# Patient Record
Sex: Male | Born: 1935 | Race: White | Hispanic: No | Smoking: Former smoker
Health system: Southern US, Community
[De-identification: ages and names within clinical notes are randomized; demographics above are authoritative.]

## PROBLEM LIST (undated history)

## (undated) DIAGNOSIS — N189 Chronic kidney disease, unspecified: Secondary | ICD-10-CM

## (undated) DIAGNOSIS — M1990 Unspecified osteoarthritis, unspecified site: Secondary | ICD-10-CM

## (undated) DIAGNOSIS — C3490 Malignant neoplasm of unspecified part of unspecified bronchus or lung: Secondary | ICD-10-CM

## (undated) DIAGNOSIS — I10 Essential (primary) hypertension: Secondary | ICD-10-CM

## (undated) DIAGNOSIS — E559 Vitamin D deficiency, unspecified: Secondary | ICD-10-CM

## (undated) DIAGNOSIS — E039 Hypothyroidism, unspecified: Secondary | ICD-10-CM

## (undated) DIAGNOSIS — K219 Gastro-esophageal reflux disease without esophagitis: Secondary | ICD-10-CM

## (undated) DIAGNOSIS — I2510 Atherosclerotic heart disease of native coronary artery without angina pectoris: Secondary | ICD-10-CM

## (undated) DIAGNOSIS — H9190 Unspecified hearing loss, unspecified ear: Secondary | ICD-10-CM

## (undated) DIAGNOSIS — D649 Anemia, unspecified: Secondary | ICD-10-CM

## (undated) DIAGNOSIS — R7303 Prediabetes: Secondary | ICD-10-CM

## (undated) DIAGNOSIS — C801 Malignant (primary) neoplasm, unspecified: Secondary | ICD-10-CM

## (undated) HISTORY — DX: Prediabetes: R73.03

## (undated) HISTORY — PX: CORONARY ANGIOPLASTY: SHX604

## (undated) HISTORY — PX: TONSILLECTOMY: SUR1361

## (undated) HISTORY — DX: Malignant neoplasm of unspecified part of unspecified bronchus or lung: C34.90

## (undated) HISTORY — PX: EYE SURGERY: SHX253

## (undated) HISTORY — DX: Vitamin D deficiency, unspecified: E55.9

## (undated) HISTORY — PX: CHOLECYSTECTOMY: SHX55

## (undated) HISTORY — PX: BACK SURGERY: SHX140

## (undated) HISTORY — PX: JOINT REPLACEMENT: SHX530

## (undated) HISTORY — PX: APPENDECTOMY: SHX54

---

## 1998-02-12 ENCOUNTER — Other Ambulatory Visit: Admission: RE | Admit: 1998-02-12 | Discharge: 1998-02-12 | Payer: Self-pay | Admitting: Internal Medicine

## 1998-04-30 ENCOUNTER — Other Ambulatory Visit: Admission: RE | Admit: 1998-04-30 | Discharge: 1998-04-30 | Payer: Self-pay | Admitting: Internal Medicine

## 1999-02-06 ENCOUNTER — Ambulatory Visit (HOSPITAL_COMMUNITY): Admission: RE | Admit: 1999-02-06 | Discharge: 1999-02-06 | Payer: Self-pay | Admitting: Gastroenterology

## 2002-04-15 ENCOUNTER — Emergency Department (HOSPITAL_COMMUNITY): Admission: EM | Admit: 2002-04-15 | Discharge: 2002-04-15 | Payer: Self-pay | Admitting: Emergency Medicine

## 2003-01-10 ENCOUNTER — Encounter: Payer: Self-pay | Admitting: Internal Medicine

## 2003-01-10 ENCOUNTER — Ambulatory Visit (HOSPITAL_COMMUNITY): Admission: RE | Admit: 2003-01-10 | Discharge: 2003-01-10 | Payer: Self-pay | Admitting: Internal Medicine

## 2003-01-25 ENCOUNTER — Encounter: Payer: Self-pay | Admitting: Orthopedic Surgery

## 2003-01-30 ENCOUNTER — Encounter: Payer: Self-pay | Admitting: Orthopedic Surgery

## 2003-01-30 ENCOUNTER — Inpatient Hospital Stay (HOSPITAL_COMMUNITY): Admission: RE | Admit: 2003-01-30 | Discharge: 2003-02-02 | Payer: Self-pay | Admitting: Orthopedic Surgery

## 2003-02-02 ENCOUNTER — Inpatient Hospital Stay (HOSPITAL_COMMUNITY)
Admission: RE | Admit: 2003-02-02 | Discharge: 2003-02-09 | Payer: Self-pay | Admitting: Physical Medicine & Rehabilitation

## 2004-02-05 ENCOUNTER — Inpatient Hospital Stay (HOSPITAL_COMMUNITY): Admission: RE | Admit: 2004-02-05 | Discharge: 2004-02-12 | Payer: Self-pay | Admitting: Orthopedic Surgery

## 2004-02-12 ENCOUNTER — Inpatient Hospital Stay (HOSPITAL_COMMUNITY)
Admission: RE | Admit: 2004-02-12 | Discharge: 2004-02-16 | Payer: Self-pay | Admitting: Physical Medicine & Rehabilitation

## 2004-10-16 ENCOUNTER — Encounter (INDEPENDENT_AMBULATORY_CARE_PROVIDER_SITE_OTHER): Payer: Self-pay | Admitting: Specialist

## 2004-10-16 ENCOUNTER — Ambulatory Visit (HOSPITAL_COMMUNITY): Admission: RE | Admit: 2004-10-16 | Discharge: 2004-10-16 | Payer: Self-pay | Admitting: Gastroenterology

## 2004-10-16 LAB — HM COLONOSCOPY

## 2008-06-02 ENCOUNTER — Encounter: Admission: RE | Admit: 2008-06-02 | Discharge: 2008-06-02 | Payer: Self-pay | Admitting: Cardiology

## 2008-06-06 ENCOUNTER — Inpatient Hospital Stay (HOSPITAL_BASED_OUTPATIENT_CLINIC_OR_DEPARTMENT_OTHER): Admission: RE | Admit: 2008-06-06 | Discharge: 2008-06-06 | Payer: Self-pay | Admitting: Cardiology

## 2008-06-12 ENCOUNTER — Inpatient Hospital Stay (HOSPITAL_COMMUNITY): Admission: RE | Admit: 2008-06-12 | Discharge: 2008-06-13 | Payer: Self-pay | Admitting: Cardiology

## 2008-06-12 ENCOUNTER — Ambulatory Visit: Payer: Self-pay | Admitting: Cardiology

## 2008-07-06 ENCOUNTER — Encounter (HOSPITAL_COMMUNITY): Admission: RE | Admit: 2008-07-06 | Discharge: 2008-10-04 | Payer: Self-pay | Admitting: Cardiology

## 2011-03-25 NOTE — H&P (Signed)
NAMEKINGSTYN, Malone               ACCOUNT NO.:  [id number]   MEDICAL RECORD NO.:  [id number]          PATIENT TYPE:  OIB   LOCATION:  2914                         FACILITY:  [facility]   PHYSICIAN:  Everardo Beals. Juanda Chance, MD, FACCDATE OF BIRTH:  11-01-1929   DATE OF ADMISSION:  06/12/2008  DATE OF DISCHARGE:                              HISTORY & PHYSICAL   PRIMARY CARE PHYSICIAN:  Lucky Cowboy, MD   PRIMARY CARDIOLOGIST:  Georga Hacking, MD   CHIEF COMPLAINT:  Abnormal heart catheterization.   HISTORY OF PRESENT ILLNESS:  [name] is a 77 year old male who had  chest discomfort and then had an abnormal Myoview.  Dr. Donnie Aho cath'd  the patient, and he had a very tight ostial lesion in the RCA.  Dr.  Juanda Chance reviewed the films and felt percutaneous intervention was  indicated.  Mr. [name] is here today for the procedure.   Mr. Winski is currently pain free.  He has not had resting chest pain.  He has not had exertional chest pain recently because he is limiting his  activity.  Currently, he is resting comfortably.   PAST MEDICAL HISTORY:  1. Hyperlipidemia.  2. Hypertension.  3. Morbid obesity.  4. Hypothyroidism.  5. Diabetes (The patient is aware of the possibility, but is not aware      that this is a diagnosis for him).  6. Anemia.   ALLERGIES:  CELEBREX.   CURRENT MEDICATIONS:  1. Atenolol 100 mg one half tablet daily.  2. Bumex 2 mg one half tablet daily.  3. Levothyroxine 200 mcg, one half tablet daily  4. Multivitamin and vitamin C daily.  5. Doxazosin 8 mg one half tablet daily.  6. Aspirin 81 mg daily.   PAST SURGICAL HISTORY:  He is status post diagnostic cardiac  catheterization as well as colonoscopy and polypectomy, left total knee  replacement, and right total knee replacement.   SOCIAL HISTORY:  He lives in [place] with a significant other.  He  quit smoking more than 25 years ago.  He has a drink daily, but denies  alcohol or drug abuse.   FAMILY  HISTORY:  Noncontributory.   REVIEW OF SYSTEMS:  He is extremely hard of hearing.  His chest pain is  as described above.  He has some chronic arthralgias and joint pains.  He denies hematemesis, hemoptysis, or melena.  He has had no fevers or  chills.  Full 14-point review of systems is otherwise negative.   PHYSICAL EXAMINATION:  VITAL SIGNS:  Temperature is 98.0, blood pressure  152/71, heart rate 57, respiratory 20, and O2 saturation 97% on room  air.  GENERAL:  He is a well-developed and well-nourished white male, in no  acute distress.  HEENT:  Normal.  NECK:  There is no JVD, no thyromegaly, and no carotid bruits  appreciated.  LUNGS:  Essentially clear to auscultation bilaterally.  CV:  His heart is regular rate and rhythm with an S1 and S2 and no  significant murmur, rub, or gallop is noted.  ABDOMEN:  Obese, soft, and nontender with active bowel sounds.  EXTREMITIES:  There is no cyanosis or clubbing noted, but he has trace  lower extremity edema.  SKIN:  No rashes or lesions are noted, but he has some irritation from  the tape, used in the previous catheterization in his right groin, but  his right groin has no significant ecchymosis, bruit, or hematoma.  NEUROLOGIC:  He is alert and oriented with cranial nerves II through XII  grossly intact.   Chest x-ray from June 2009, shows no acute disease.   LABORATORY VALUES:  Hemoglobin 13.6, hematocrit 41.3, WBC is 8.1, and  platelets 183.  INR 1.0 and PTT 27.  Sodium 135, potassium 4.2, chloride  106, CO2 of 21, BUN 26, creatinine 1.34, GFR 52, and glucose 134.   EKG done on Apr 05, 2008, shows sinus rhythm rate 64 beats per minute  with no acute ischemic changes.   IMPRESSION:  Coronary artery disease:  Mr. Crombie is here today for  percutaneous intervention.  He will follow up with Dr. Donnie Aho after this  with further management of his coronary artery disease and cardiac risk  factors per Dr. Donnie Aho.  We will order a  Nutrition consult while he is  in the hospital, they will talk to him about a heart-healthy, low-fat,  and decreased carbohydrate diet.      Theodore Demark, PA-C      Bruce R. Juanda Chance, MD, [facility]  Electronically Signed    RB/MEDQ  D:  06/12/2008  T:  06/12/2008  Job:  [id number]

## 2011-03-25 NOTE — Cardiovascular Report (Signed)
[name], [name]               ACCOUNT NO.:  [id number]   MEDICAL RECORD NO.:  [id number]          PATIENT TYPE:  OIB   LOCATION:  2914                         FACILITY:  [facility]   PHYSICIAN:  Everardo Beals. Juanda Chance, MD, FACCDATE OF BIRTH:  07-20-1929   DATE OF PROCEDURE:  06/12/2008  DATE OF DISCHARGE:                            CARDIAC CATHETERIZATION   CLINICAL HISTORY:  [name] is 77 years old and developed exertional  angina, and I believe he had abnormal Myoview scan.  He was studied in  the outpatient laboratory and found to have a critically tight ostial  lesion in the right coronary artery and was scheduled for percutaneous  intervention today.   PROCEDURE:  The procedure was followed via the femoral artery using  arterial sheath and a 6-French right bypass graft guiding catheter with  side holes.  The patient was given antiemetics bolus infusion and he was  started on Plavix last week and was given additional 300 mg of Plavix  load as well as four chewable aspirin.  We passed a Prowater wire across  the lesion in the ostium of the right coronary and down the distal  vessel without difficulty.  We predilated the lesion with a 2.0 x 50-mm  apex performing two inflations up to 8 atmospheres for about 30 seconds.  We then used a 3.25 x 6-mm cutting balloon and performed three  inflations up to 10 atmospheres for about 30 seconds.  We then upgraded  to a 3.75 x 6-mm cutting balloon and performed three inflations up to 10  atmospheres for about 30 seconds.  We then deployed a 3.5 x 12-mm  Liberte bare-metal stent positioning the proximal edge just outside the  ostium and deploying this with one inflation of 16 atmospheres for 30  seconds.  We postdilated the stent avoiding the distal edge with a 4.5 x  8-mm Quantum Maverick performing three inflations of 16 atmospheres for  about 30 seconds.  Final diagnostics were then performed through the  guiding catheter.  The patient  tolerated the procedure well and left the  laboratory in satisfactory condition.   RESULTS:  Initially, stenosis at the ostium of the right coronary was  estimated at 95%.  Following stenting, this improved to less than 10%.   CONCLUSION:  Successful percutaneous transluminal angioplasty of the  ostial lesion in the right coronary using a Liberte bare-metal stent  with improvement center narrowing from 95% to less than 10%.   DISPOSITION:  The patient then presents for further observation.  Recommend Plavix for a year.      Bruce Elvera Lennox Juanda Chance, MD, [facility]  Electronically Signed     BRB/MEDQ  D:  06/12/2008  T:  06/13/2008  Job:  [id number]   cc:   Georga Hacking, M.D.

## 2011-03-25 NOTE — Cardiovascular Report (Signed)
[name], [name] NO.:  [id number]   MEDICAL RECORD NO.:  [id number]          PATIENT TYPE:  OIB   LOCATION:  1961                         FACILITY:  [facility]   PHYSICIAN:  Georga Hacking, M.D.DATE OF BIRTH:  1928/11/12   DATE OF PROCEDURE:  06/06/2008  DATE OF DISCHARGE:                            CARDIAC CATHETERIZATION   HISTORY:  A 77 year old male who has had dyspnea, diabetes, and some  vague chest pain and had an abnormal Cardiolite with inferior ischemia  and a previous inferior infarction.   PROCEDURE:  Left heart catheterization with coronary angiograms and left  ventriculogram.   PROCEDURE:  The procedure was done in the outpatient diagnostic  laboratory.  He was prepped and draped in the usual manner.  After  Xylocaine anesthesia, a 4-French sheath was placed in the right femoral  percutaneously through a single anterior needle wall stick.  The  arteries were visualized using 4-French catheters and a 30 mL  ventriculogram was performed.  He tolerated the procedure well and at  the end of the procedure, his sheath was removed.   HEMODYNAMIC DATA:  Aorta postcontrast 158/63 and LV postcontrast 158/20.   ANGIOGRAPHIC DATA:  Left ventriculogram:  Performed in the 30-degree RAO  projection.  The aortic valve is normal.  The mitral valve is normal.  The left ventricle appears normal in size.  There is mild inferior wall  hypokinesis noted.  The estimated ejection fraction was 50%.  Coronary  arteries arise and distribute normally.  Significant calcifications  noted involving the left coronary system as well as the ostium of the  right coronary artery.  Left main coronary artery:  Calcified with diffuse 30% narrowing.  There  is calcification present at the ostium of the vessel also.  Left anterior descending:  The vessel is calcified with diffuse  narrowing in the proximal portion, but there is no severe obstructive  stenoses noted.  The vessels  are somewhat small distally and appears to  be diffusely diseased proximally.  Circumflex coronary artery:  Calcified with scattered irregularities.  Right coronary artery:  Calcified at the ostium.  There is a severe 95-  99% ostial stenosis which is heavily calcified and eccentric.  The  vessel is a large vessel and is a dominant vessel supplying the  posterior descending and several posterolateral branches.   IMPRESSION:  1. Severe ostial coronary artery disease involving the right coronary      artery and moderate diffuse calcific      coronary artery disease involving the left coronary artery.  2. Mild left ventricular dysfunction with inferior wall hypokinesis      with preserved ejection fraction.   RECOMMENDATIONS:  Consider rotational atherectomy and stenting of the  ostium of the right coronary artery.      Georga Hacking, M.D.  Electronically Signed     WST/MEDQ  D:  06/06/2008  T:  06/06/2008  Job:  [id number]   cc:   Jeoffrey Massed, MD

## 2011-03-25 NOTE — Discharge Summary (Signed)
[name], [name]               ACCOUNT NO.:  [id number]   MEDICAL RECORD NO.:  [id number]          PATIENT TYPE:  OIB   LOCATION:  2914                         FACILITY:  [facility]   PHYSICIAN:  Everardo Beals. Juanda Chance, MD, FACCDATE OF BIRTH:  11-Apr-1929   DATE OF ADMISSION:  06/12/2008  DATE OF DISCHARGE:  06/13/2008                               DISCHARGE SUMMARY   PROCEDURES:  1. Cardiac catheterization.  2. PTCA and bare-metal stent to one vessel.   PRIMARY/FINAL DISCHARGE DIAGNOSIS:  Unstable anginal pain, status post  percutaneous transluminal coronary angioplasty and bare-metal stent to  the right coronary artery  this admission.   SECONDARY DIAGNOSES:  1. Hypertension.  2. Hyperlipidemia.  3. Morbid obesity.  4. Hypothyroidism.  5. Diabetes with a fasting blood sugar of 129.  6. Anemia.  7. Allergy or intolerance to Celebrex.   TIME OF DISCHARGE:  34 minutes.   HOSPITAL COURSE:  Mr. Balandran is a 77 year old male with no previous  history of coronary artery disease.  Dr. Donnie Aho did heart cath on him  for an abnormal Myoview and he had a significant stenosis in his RCA.  He came to the hospital for outpatient percutaneous intervention on  June 12, 2008.   Mr. Bischof had a bare-metal stent to his RCA reducing the stenosis from  95% to less than 10%.  Mr. Hartig tolerated the procedure well.  He was  Angio-Sealed.  On June 13, 2008, his groin was stable.  He was seen by  cardiac rehab and had no chest pain with ambulation.  Dr. Juanda Chance  evaluated Mr. Kurtzman and felt that he was stable for discharge and  outpatient followup with Dr. Donnie Aho.   DISCHARGE INSTRUCTIONS:  1. His activity level is to be increased gradually with no lifting for      2 weeks and no driving for 2 days.  He is to stick to a low sodium      and low sugar diet.  He is to follow up with Dr. Donnie Aho within 2      weeks.  He is to follow up with Dr. Milinda Cave as needed.   DISCHARGE MEDICATIONS:  1. Atenolol  100 mg one-half tablet daily.  2. Bumetanide 2 mg one half tablet daily  3. Levoxyl 200 mcg daily.  4. Doxazosin 8 mg one half tablet nightly.  5. Pravastatin 40 mg daily.  6. Aspirin 81 mg one half tablet daily.  7. Allopurinol 300 mg one half tablet daily.  8. Plavix 75 mg daily.  9. Vitamin D 8000 units daily.  10.Vitamin C daily.  11.Multivitamin daily.  12.Sublingual nitroglycerin p.r.n.      Theodore Demark, PA-C      Bruce R. Juanda Chance, MD, [facility]  Electronically Signed    RB/MEDQ  D:  06/13/2008  T:  06/13/2008  Job:  [id number]   cc:   Jeoffrey Massed, MD  Georga Hacking, M.D.

## 2011-03-25 NOTE — Cardiovascular Report (Signed)
[name], [name]               ACCOUNT NO.:  [id number]   MEDICAL RECORD NO.:  [id number]          PATIENT TYPE:  OIB   LOCATION:  1961                         FACILITY:  [facility]   PHYSICIAN:  Everardo Beals. Juanda Chance, MD, FACCDATE OF BIRTH:  1928/12/10   DATE OF PROCEDURE:  DATE OF DISCHARGE:  06/06/2008                            CARDIAC CATHETERIZATION   I saw [name] today at the request of Dr. Viann Fish for  consideration for PCI of an ostial right lesion.  [name] is 77 years  old and has had exertional chest discomfort, had an abnormal Myoview  scan showing inferior ischemia.  Catheterization done today by Dr.  Donnie Aho showed a very tight ostial lesion in the right coronary artery  with calcification.  There was no nonobstructive disease in the left  system.  The calibrated vessel was at least 40.   We planned percutaneous coronary intervention of the right coronary  artery lesion on Monday, June 12, 2008.  We will probably do rotational  atherectomy and probably use a bare metal stent.  I discussed the  procedure and risks with Mr. Donnie Aho and his partner Latanya Presser.  I  will plan for him to come in early Monday morning.      Bruce Elvera Lennox Juanda Chance, MD, [facility]  Electronically Signed     BRB/MEDQ  D:  06/06/2008  T:  06/06/2008  Job:  [id number]

## 2011-03-28 NOTE — Discharge Summary (Signed)
NAME:  [name], [name]                         ACCOUNT NO.:  [id number]   MEDICAL RECORD NO.:  [id number]                   PATIENT TYPE:  INP   LOCATION:  5037                                 FACILITY:  [facility]   PHYSICIAN:  Loreta Ave, M.D.              DATE OF BIRTH:  10-25-1929   DATE OF ADMISSION:  01/30/2003  DATE OF DISCHARGE:  02/02/2003                                 DISCHARGE SUMMARY   ADMISSION DIAGNOSES:  Advanced degenerative joint disease of the right knee.   DISCHARGE DIAGNOSES:  1. Advanced degenerative joint disease of the right knee.  2. Hypertension.  3. Hypothyroidism.  4. Exogenous obesity.   PROCEDURE:  Right total knee replacement.   HISTORY OF PRESENT ILLNESS:  The patient is a 77 year old male with  longstanding pain in the right knee.  Radiographically noted bone on bone  degenerative joint disease.  He has failed with conservative treatment.  He  is now having difficulty with his activities of daily living.  He is  indicated now for right total knee replacement.   HOSPITAL COURSE:  77 year old male admitted January 31, 2003 after appropriate  laboratory studies were obtained, as well as 1 gram of Ancef intravenous on  call to the operating room, was taken to the operating room where he  underwent a right total knee replacement.  Intraoperative Foley was placed.  The patient tolerated the procedure well.  A PCA pump was used for  postoperative pain management, that of Dilaudid. Heparin 5000 units SUBQ,  q.12h. was started until Coumadin became therapeutic.  Consultation with  physical therapy, occupational therapy and rehabilitation were made.  Ambulation with weight-bearing as tolerated on the right, CPM 0-30 degrees  for 8 hours a day, increasing by 10 degrees per day.  He was allowed out of  bed to a chair the following day. He did have hypoactive bowel sounds and a  liquid diet was started on the first postoperative day.  This was increased  as tolerated.  Reglan 5 mg p.o. t.i.d. per 24 hours was begun.  On February 01, 2003 he had his diet advanced.  He was weaned of oral pain medications.  He  was placed on 1000 cc fluid restriction.  He did well postoperatively and  was transferred to rehabilitation on February 02, 2003 to continue with his  physical therapy and occupational therapy.   LABORATORY DATA:  Radiographic studies of January 30, 2003 reveals recent  postoperative image of a total knee arthroplasty without complications. A  chest x-ray of January 10, 2003 revealed changes of bronchitis, possibly acute  versus chronic.   His laboratory studies revealed hemoglobin 13.8, hematocrit 40.5%, white  blood cell count 10,100, platelet count 172,000.  Discharge hemoglobin was  10.1, hematocrit 30.2%.  ProTime at discharge was 16.0 with INR 1.3.  Chemistries preop showed sodium 138, potassium 4.3, chloride 108, cO2 26,  glucose 111,  BUN 18, creatinine 1.3, calcium 9.0, total protein 6.9, albumin  3.5, AST 26, ALT 27, ALP 83, total bilirubin 0.7.  Discharge sodium 133,  potassium 3.7, chloride 99, cO2 26, glucose 137, BUN 13, creatinine 1.3,  calcium 8.3.  Urinalysis revealed trace ketones, 30 protein, otherwise  unremarkable.  Blood type O positive, antibody screen is negative.    DISCHARGE INSTRUCTIONS:  The patient was transferred to rehabilitation where  he will continue with his physical therapy and occupational therapy.  He is  discharged on a house diet.   CONDITION ON DISCHARGE:  The patient is discharged in good condition.     Oris Drone Petrarca, P.A.-C.                Loreta Ave, M.D.    BDP/MEDQ  D:  03/10/2003  T:  03/10/2003  Job:  [id number]

## 2011-03-28 NOTE — Discharge Summary (Signed)
NAME:  [name], [name]                         ACCOUNT NO.:  [id number]   MEDICAL RECORD NO.:  [id number]                   PATIENT TYPE:  IPS   LOCATION:  4140                                 FACILITY:  [facility]   PHYSICIAN:  Ranelle Oyster, M.D.             DATE OF BIRTH:  1928-11-28   DATE OF ADMISSION:  02/12/2004  DATE OF DISCHARGE:  02/16/2004                                 DISCHARGE SUMMARY   DISCHARGE DIAGNOSES:  1. Left total knee replacement.  2. Renal insufficiency.  3. Wound prophylaxis.   HISTORY OF PRESENT ILLNESS:  Mr. Savitz is a 77 year old male with history  of hypertension, DJD, status post right knee replacement, and end-stage  arthritis left knee.  He elected to undergo left total knee replacement by  Dr. Eulah Pont.  Postoperatively he is weightbearing as tolerated, on Coumadin  for DVT prophylaxis.  He was noted to have postoperative anemia and was  transfused with 2 units of packed red blood cells.  He developed a left  pretibial area of erythema and was placed on Keflex for wound prophylaxis.  Therapies initiated and the patient currently at minimum assist for  transfers, minimum assist for ambulating 40 feet with wide walker.   PAST MEDICAL HISTORY:  Significant for cholecystectomy, appendectomy,  nocturia, occasional constipation, tonsillectomy, low back surgery, right  total knee replacement, cataracts, hypothyroidism.   ALLERGIES:  No known drug allergies.   SOCIAL HISTORY:  The patient lives with girlfriend in one-level home with  one step at entry.  Was independent prior to admission.  He could quit  smoking in the 1970s, uses alcohol three times a week.   HOSPITAL COURSE:  Mr. Owain Eckerman was admitted to rehab on February 12, 2004  for inpatient therapies to consist of PT/OT daily.  Past admission he was  maintained on Coumadin for DVT prophylaxis and postoperative anemia was  supplemented with iron supplements.  Blood pressures were monitored on  b.i.d. basis on Tenormin and Cardura.  Bumex was held as follow-up labs  showed the patient to have renal insufficiency with BUN at 29, creatinine  1.5.  Follow-up of postoperative anemia showed H&H to be stable with  hemoglobin 9.8, hematocrit 28.3, white count 8.9, platelets 256.  A recheck  of renal insufficiency off Bumex showed the patient to continue with renal  insufficiency with worsening of his dehydration with BUN at 32, creatinine  at 1.4.  The patient is instructed to keep pushing p.o. fluids, home health  R.N. to draw electrolytes in 1 week with results to Dr. Oneta Rack.  The  patient's knee incision has healed well without any signs or symptoms of  infection.  He did continue with cellulitic changes on his left shin.  Keflex was continued at time of discharge for additional 5 days.  He is to  follow up with Dr. Eulah Pont for evaluation of this area as well as for further  instructions regarding continuing antibiotics.  During his stay in rehab Mr.  Beyene progressed to being at independent level for transfers, modified  independent for ambulating 150 feet.  Knee flexion was at -2 to 85 degrees.  He was modified independent for upper body care, required supervision to  minimum assist for lower body care.  He will continue to receive follow-up  home health PT/OT by [facility] services with home health R.N. to  draw protime and [facility] to adjust Coumadin as needed through March 07, 2004.  On February 12, 2004 the patient is discharged to home.   DISCHARGE MEDICATIONS:  1. Trinsicon one p.o. b.i.d.  2. Tenormin 50 mg one daily.  3. Levothyroxine 100 mcg a day.  4. Cardura 4 mg a day.  5. Zestril 40 mg a day.  6. Bumex 1 mg a day.  7. Os-Cal + D t.i.d.  8. Coumadin 2.5 mg per day.  9. Robaxin 500 mg q.i.d. p.r.n. spasms.  10.      Oxycodone IR 5-10 mg q.4-6h. p.r.n. pain.   ACTIVITY:  Use walker.   PAIN CONTROL:  May use Tylenol as needed for pain.   DIET:   Regular.  Drink plenty of fluids.   WOUND CARE:  Keep area clean and dry.   SPECIAL INSTRUCTIONS:  No alcohol, no smoking, no driving.   FOLLOW-UP:  1. The patient to follow up with Dr. Oneta Rack for appointment in 4 weeks.  2. Follow up with Dr. Eulah Pont for staple removal in 2-3 days.  3. Follow up with Dr. Riley Kill as needed.      Greg Cutter, P.A.                    Ranelle Oyster, M.D.    PP/MEDQ  D:  02/19/2004  T:  02/20/2004  Job:  [id number]   cc:   Loreta Ave, M.D.  [address]  [place] [address]  Fax: [phone]   Lucky Cowboy, M.D.  [address], [address]  [place], [place] [address]  Fax: [phone]

## 2011-03-28 NOTE — Op Note (Signed)
NAME:  [name], [name]                         ACCOUNT NO.:  [id number]   MEDICAL RECORD NO.:  [id number]                   PATIENT TYPE:  INP   LOCATION:  5037                                 FACILITY:  [facility]   PHYSICIAN:  Loreta Ave, M.D.              DATE OF BIRTH:  [birth year]   DATE OF PROCEDURE:  01/30/2003  DATE OF DISCHARGE:                                 OPERATIVE REPORT   PREOPERATIVE DIAGNOSIS:  End-stage degenerative arthritis, right knee, varus  alignment.   POSTOPERATIVE DIAGNOSIS:  End-stage degenerative arthritis, right knee,  varus alignment.   OPERATION/PROCEDURE:  1. Right total knee replacement, Osteonics prosthesis with appropriate soft     tissue balancing including medial capsular and posterior capsular     release, posterior stabilized, posterior cemented #9 microstructured     femoral component, cemented #9 tibial component with 15 mm PS Flex #9     insert, cemented recessed non metal backed 28 mm patella component.   SURGEON:  Loreta Ave, M.D.   ASSISTANT:  Arlys John D. Petrarca, P.A.-C.   ANESTHESIA:  General.   ESTIMATED BLOOD LOSS:  Minimal.   TOURNIQUET TIME:  One hour and 15 minutes.   SPECIMENS:  Bone and soft tissue.   CULTURES:  None.   COMPLICATIONS:  None.   DRESSINGS:  Soft compressive with knee immobilizer.   DRAINS:  Hemovac x 2.   DESCRIPTION OF PROCEDURE:  The patient was brought to the operating room and  placed on the operating table in the supine position.  After adequate  anesthesia had been obtained, right knee was examined.  Full extension, 120  degrees flexion.  Alignment more than 5 degrees of varus, correctable to  neutral.  Reasonable patellofemoral tracking.  Stable ligaments.  Tourniquet  applied, prepped and draped in the usual sterile fashion. Exsanguinated with  elevation Esmarch, tourniquet inflated to 350 mmHg.   A straight incision above the patella down to the tibial tubercle.  Hemostasis with  cautery.  Medial parapatellar arthrotomy.  Knee exposed.  Grade 4 changes throughout.  Medial capsular released.  Remnants of cruciate  ligaments, menisci periarticular spurs, loose bodies all removed.  Distal  femur exposed.  Intramedullary guide placed.  Distal cut at 5 degrees of  valgus removing 10 mm.  Sized to #9 component.  Jigs put in place and  definitive cuts made for posterior stabilizing component.  Attention turned  to the tibia.  Tibial spine removed with a saw.  Proximal cut removing 4 mm  off the medial side with a 5 degree posterior slope cut protecting the  posterior and collateral structures.  All debris cleared from all recesses.  Patella sized, reamed and drilled for 28 mm component, centering it on the  ridge.  Trials then placed throughout; #9 on the femur, #9 on the tibia, 28  on the patella with the 15 mm Flex insert.  Full extension,  full flexion,  nicely balanced knee with good patellofemoral tracking.  No lift off  __________.  Adequate medial cuts were released to get a balanced knee.  Tibia marked for appropriate rotation and reaming.  All trials were removed.  Copious irrigation with a pulse irrigating device.  Tibial components  cemented in place.  Excess cement removed with Glor[place].  Polyethylene  attached.  Femoral components seated firmly with posterior cement.  Excess  cement was removed.  Patella component cemented in place on the patella.  All cement removed from the margins.  Knee irrigated.  Once the cement had  hardened, the knee was reexamined.  Full extension and full flexion, good  tracking. Patellofemoral joint.  Hemovacs placed and brought out through  separate stab wounds.  Arthrotomy closed with #1 Vicryl, skin and  subcutaneous tissue with Vicryl and staples in the margins of the wound.  Knee injected with Marcaine.  Sterile compressive dressing applied.  Tourniquet deflated and removed. Knee immobilizer removed.  Knee immobilizer  applied.   Anesthesia reversed.  Brought to the recovery room.  Tolerated the  surgery well with no complications.                                               Loreta Ave, M.D.    DFM/MEDQ  D:  01/30/2003  T:  01/30/2003  Job:  [id number]

## 2011-03-28 NOTE — Discharge Summary (Signed)
NAME:  [name], [name]                         ACCOUNT NO.:  [id number]   MEDICAL RECORD NO.:  [id number]                   PATIENT TYPE:  INP   LOCATION:  5030                                 FACILITY:  [facility]   PHYSICIAN:  Loreta Ave, M.D.              DATE OF BIRTH:  09/25/1929   DATE OF ADMISSION:  02/05/2004  DATE OF DISCHARGE:  02/12/2004                                 DISCHARGE SUMMARY   ADMISSION DIAGNOSIS:  Advanced degenerative joint disease of the right knee.   DISCHARGE DIAGNOSIS:  1. Advanced degenerative joint disease of the right knee.  2. Postoperative anemia.  3. Hypertension.  4. Hyperthyroidism.   PROCEDURE:  Left total knee replacement.   HISTORY:  This is a 77 year old male, status post right total knee  replacement with good results.  He is now having increasing pain and  discomfort in his left knee, especially with activities as well as with rest  at night time.  He has failed conservative treatment.  He is now indicated  for left total knee replacement.   HOSPITAL COURSE:  A 77 year old white male admitted February 05, 2004, after  appropriate laboratory studies were obtained as well as 1 gram of Ancef IV  on call at the operating room.  He was taken to the operating room where he  underwent a left total knee replacement.  He tolerated the procedure well.  He was continued on Ancef 1 gram IV q.8h. x3 doses.  A PCA Dilaudid pump as  per protocol was used for pain management.  Femoral nerve block was also  used.  Heparin 5000 units subcu q.12h. was started until his Coumadin became  therapeutic.   Consultation with PT, OT and rehabilitation were made.  He was allowed to  ambulate, weightbearing as tolerated.  A Foley was placed intraoperatively.  He was allowed out of bed to a chair the following day.  He did have some  decreased calcium which was corrected with Os-Cal 500 mg on a daily basis.  His PCA was discontinued on the 30th.  His Hemovacs were  pulled.  Dressing  was changed revealing a benign wound.  He did have mild temperature  elevation, and this is controlled with incentive spirometry.  He was typed  and crossed for one unit of packed cells on April 1, and this was given.  Keflex 500 mg p.o. q.i.d. x5 days was also started on February 11, 2004, for  some redness about the wound.  There was a question of whether there was  some cellulitis.  He was also given one more unit of packed cells on April  3.  The remainder of his hospital course was uneventful.  He was transferred  to rehabilitation on the 3rd to continue with his physical therapy and  occupational therapy.   EKG revealed sinus bradycardia with occasional premature supraventricular  complexes, otherwise normal.   RADIOGRAPHIC STUDIES:  Left knee revealed status post left total knee  arthroplasty without apparent complicating features.   LABORATORY DATA:  Admitted with a hemoglobin 12.7, hematocrit 38.3 percent,  white count 7500, platelets 175,000.  Discharge hemoglobin was 10.2, hematocrit 30.1, WBC 8.7, and platelets  238,000.  Prothrombin time at the time of discharge was 24.6 with an INR of  3.2.  Chemistries revealed sodium of 138, potassium 4.6, chloride 108, cO2  24, glucose 117, BUN 19, creatinine 1.4.  Calcium 8.9.  Total protein 6.8.  Albumin 3.4.  AST 23, ALT 23, ALP 103.  Total bilirubin 0.5.  Discharge  sodium 134, potassium 4.2, chloride 103, cO2 25, glucose 114, BUN 23,  creatinine 1.6, calcium 8.1.  Urinalysis benign for  blood in his urine.  Blood type was O positive.  Antibody screen negative .  Two units of packed cells were given during his hospital course.   DISCHARGE INSTRUCTIONS:  He was discharged on April 4 to rehabilitation  where he will continue with his physical and occupational therapy.   DISCHARGE CONDITION:  He was discharged in improved condition.      Oris Drone Petrarca, P.A.-C.                Loreta Ave, M.D.     BDP/MEDQ  D:  03/08/2004  T:  03/08/2004  Job:  [id number]

## 2011-03-28 NOTE — Discharge Summary (Signed)
NAME:  [name], [name]                         ACCOUNT NO.:  [id number]   MEDICAL RECORD NO.:  [id number]                   PATIENT TYPE:  IPS   LOCATION:  4145                                 FACILITY:  [facility]   PHYSICIAN:  Ellwood Dense, M.D.                DATE OF BIRTH:  01-Dec-1928   DATE OF ADMISSION:  02/02/2003  DATE OF DISCHARGE:  02/09/2003                                 DISCHARGE SUMMARY   DISCHARGE DIAGNOSES:  1. Right total knee replacement secondary to osteoarthritis.  2. History of hypothyroidism.  3. History of hypertension.  4. Anemia.  5. Hyponatremia, resolved.  6. History of hypercholesterolemia.   HISTORY OF PRESENT ILLNESS:  The patient is a 77 year old white male  admitted on 01/30/03 with a longstanding history of right knee pain secondary  to end-stage degenerative joint disease.  The patient underwent a right  total knee replacement on 3/22 by Dr. Mckinley Jewel.  The patient was placed  on Coumadin for DVT prophylaxis.  PT report at this time indicates the  patient is ambulating 2+ total assist.  The patient ____________.  The  patient is moderate assist with bed mobility.  He is weightbearing as  tolerated.  Hospital course significant for confusion, anemia, hyponatremia,  increased urinary frequency.  The patient was transferred to [facility]  Inpatient Patient Rehabilitation Department on 02/02/03.   PAST MEDICAL HISTORY:  Significant for hypertension, elevated cholesterol,  thyroid disease and decreased hearing.   PAST SURGICAL HISTORY:  Significant for appendectomy, cholecystectomy, T&A,  back surgery.   PRIMARY CARE PHYSICIAN:  Dr. Oneta Rack.   ALLERGIES:  No known drug allergies.   SOCIAL HISTORY:  [place]-level home.  He was independent prior to admission sometimes ambulating  with cane.  He worked as a _______ at [employer].  He has a local  daughter and a girlfriend who are  able to assist after discharge.  Occasional alcohol and does not smoke.  He is widowed.   FAMILY HISTORY:  Noncontributory.   REVIEW OF SYSTEMS:  Denies any chest pain, nausea, vomiting, shortness of  breath.   HOSPITAL COURSE:  The patient was admitted to [facility] Rehab Department on  02/02/03 for comprehensive patient rehabilitation to receive more than three  hours of therapy daily.  Overall, the patient made very good progress during  his seven day stay in rehab.  He was discharged on modified independent  level and was ambulating with standing walker greater than 100 feet.  The  patient remained on Coumadin as well as Lovenox until INR was therapeutic  for DVT prophylaxis.  The patient also had venous Dopplers performed on  02/02/03 that demonstrated no evidence of DVT, SVT, or Baker's cyst  bilaterally.  The patient's hospital course while in rehab significant for  mild hyponatremia, anemia, and constipation.  The patient  remained on  Trinsicon one tablet p.o. b.i.d.  He had initial hemoglobin of 9.5 and  hematocrit 28.1.  He remained on Trinsicon throughout his entire stay in  rehab.  Admission labs also demonstrate the patient had a sodium level of  134.  At the time of admission he was on fluid restriction and the  Hydrochlorothiazide was placed on hold as well.  Fluid restriction will be  continued on day two of rehab.  The patient continued on Hydrochlorothiazide  and continued to be held.  His blood pressure remained fairly stable  throughout his hospital stay.  He continued to be on Lisinopril and  atenolol.  Overall, the patient had no other medical complications except  for constipation.  He received Sorbitol and laxative p.r.n.  and his  constipation resolved.   LATEST LABS AT TIME OF DISCHARGE:  Urine culture performed on 02/06/03 and  demonstrated greater than 100,000 colonies of bacteria species.  Hemoglobin  9.5, hematocrit 27.5, white blood cell count 7.5, platelet  count 160.  Hemoccult performed on stool was negative.  Latest sodium 135, potassium  3.8, chloride 104, CO2 23, glucose 94, BUN 20, creatinine 1.4, AST 18, ALT  15.  At the time of discharge, PT report indicated the patient is ambulating  approximately 75 feet, modified independently with rolling walker, transfer  to sit-stand with rolling walker.  He is able to propel his wheelchair  greater than 150 feet modified independently, bed mobility modified  independently. He is able to perform most ADLs with supervision modified  independent level.  He had a total of 80 degrees flexion in his right knee.  Surgical incision healed very well.  Staples are removed.  Steri-Strips were  applied.  Surgical incision  demonstrated no signs of infection.  At the  time of discharge blood pressure was slightly elevated at 160/72,  respiratory 18, pulse 78, temperature 98.1.  INR at the time of discharge  2.4.  The rest of physical exam was unremarkable.  The patient was  discharged home with his girlfriend.   DISCHARGE MEDICATIONS:  1. Trinsicon one tablet twice daily.  2. Atenolol 50 mg one tablet daily.  3. Levoxyl 0.1 mg one tablet daily.  4. Accupril 40 mg one tablet daily.  5. Niaspan 1000 mg twice daily.  6. Questran at home dose.  7. Coumadin 2.5 mg continued 03/02/03.  8. Oxycodone 5 to 10 mg every four to six hours as needed for pain.  9. Resume his diuretic Bumetanide.  10.      No aspirin, ibuprofen, or Aleve while on Coumadin.   PAIN MANAGEMENT:  Tylenol, oxycodone.   ACTIVITY:  No driving.  No drinking.  Use walker.   DIET:  Low cholesterol, low salt.   He will have [facility] for PT, OT and RN to monitor his PT and  INR.  First draw will be on Monday 02/13/03.  He will follow up with Dr.  Mckinley Jewel within two weeks.  Followup with his primary care [name]  within four to six weeks and monitor hemoglobin.  Followup with Dr. Ellwood Dense as needed.        Junie Bame, P.A.                       Ellwood Dense, M.D.    LH/MEDQ  D:  02/09/2003  T:  02/11/2003  Job:  [id number]   cc:   Lucky Cowboy, M.D.  [address]  [address], [address]  [place], [place] [address]  Fax: [phone]   Loreta Ave, M.D.  [address]  [place] [address]  Fax: [phone]

## 2011-03-28 NOTE — Op Note (Signed)
[name], COLGATE               ACCOUNT NO.:  [id number]   MEDICAL RECORD NO.:  [id number]          PATIENT TYPE:  AMB   LOCATION:  ENDO                         FACILITY:  [facility]   PHYSICIAN:  John C. Madilyn Fireman, M.D.    DATE OF BIRTH:  07-06-1929   DATE OF PROCEDURE:  10/15/2004  DATE OF DISCHARGE:                                 OPERATIVE REPORT   PROCEDURE:  Colonoscopy with polypectomy.   INDICATIONS FOR PROCEDURE:  Family history of colon cancer in a first degree  relative with last colonoscopy five years ago.   DESCRIPTION OF PROCEDURE:  The patient was placed in the left lateral  decubitus position then placed on the pulse monitor with continuous low flow  oxygen delivered by nasal cannula. He was sedated with 62.5 mcg IV fentanyl  and 8 mg IV Versed. The Olympus video colonoscope was inserted into the  rectum and advanced to the cecum, confirmed by transillumination at  McBurney's point and visualization at the ileocecal valve and appendiceal  orifice. The prep was good. The cecum, ascending, transverse, descending and  sigmoid colon all appeared normal with no masses, polyps, diverticula or  other mucosal abnormalities. Within the rectum, there was a 6 mm polyp at 12  cm that was removed by snare.  The remainder of the rectum appeared normal.  The scope was then withdrawn and the patient returned to the recovery room  in stable condition. The patient tolerated the procedure well and there were  no immediate complications.   IMPRESSION:  Small rectal polyp otherwise normal study.   PLAN:  Await histology and will probably repeat colonoscopy in five years.      JCH/MEDQ  D:  10/16/2004  T:  10/16/2004  Job:  [id number]   cc:   Lucky Cowboy, M.D.  [address]  [place], [place] [address]  Fax: [phone]

## 2011-03-28 NOTE — Op Note (Signed)
NAME:  [name], [name]                         ACCOUNT NO.:  [id number]   MEDICAL RECORD NO.:  [id number]                   PATIENT TYPE:  INP   LOCATION:  5030                                 FACILITY:  [facility]   PHYSICIAN:  Loreta Ave, M.D.              DATE OF BIRTH:  Jan 25, 1929   DATE OF PROCEDURE:  02/05/2004  DATE OF DISCHARGE:                                 OPERATIVE REPORT   PREOPERATIVE DIAGNOSES:  End stage degenerative arthritis, left knee with a  varus alignment.   POSTOPERATIVE DIAGNOSES:  End stage degenerative arthritis, left knee with a  varus alignment.  Lateral tracking and tethering of the patellofemoral  joint.   OPERATION PERFORMED:  Left total knee replacement Osteonics prosthesis.  Soft tissue balancing of the medial capsular and lateral retinacular  release.  Cemented posterior stabilized #9 femoral component.  Cemented #9  tibial component with a 12 mm posterior stabilized Flex polyethylene insert.  Cemented recessed 28 mm patellar component.   SURGEON:  Loreta Ave, M.D.   ASSISTANT:  Arlys John D. Petrarca, P.A.-C.   ANESTHESIA:  General.   ESTIMATED BLOOD LOSS:  Minimal.   TOURNIQUET TIME:  One hour and 30 minutes.   SPECIMENS:  Excised bone and soft tissue.   CULTURES:  None.   COMPLICATIONS:  None.   DRESSING:  Soft compressive with knee immobilizer.   DRAINS:  Hemovac times two.   DESCRIPTION OF PROCEDURE:  The patient was brought to the operating room and  placed on the operating table in supine position.  After adequate anesthesia  had been obtained, the left knee examined.  5 degree flexion contracture.  Further flexion to about 90 degrees.  Ligaments grossly stable.  Alignment  in 5 degrees varus correctable to neutral.  Lateral tracking and tethering  patellofemoral joint.  Tourniquet applied.  Leg prepped and draped in the  usual sterile fashion.  Exsanguinated elevation and Esmarch.  Tourniquet  inflated to 330 mmHg.   Straight incision above the patella down to the  tibial tubercle.  Medial parapatellar arthrotomy.  Hemostasis cautery.  Medial capsular release.  Knee exposed.  Grade 4 changes throughout.  Remnants of menisci, cruciate ligaments, loose bodies, spurs all removed.  Distal femur exposed.  Intramedullary guide placed.  Distal cut removed to  10 mm set at 5 degrees of valgus.  Sized to #9 component.  Jigs put in  place.  Definitive cuts made.  Tibia was exposed with a retractor protecting  collaterals and posterior structures.  Tibial spine removed with a saw.  Sized for a #9 component.  Proximal cut 5 degree posterior slope cut  removing 4 to 5 mm off the deficient medial side.  All recess examined.  All  loose bodies and spurs removed.  Patella was debrided of spurs and then  sized, reamed and drilled for the 28 mm component.  Trials put in place.  A  #  9 on the femur, #9 on the tibia and a 28 mm on the patella.  With the 12 mm  insert, I had full extension, full flexion, nicely balanced knee.  Lateral  release was necessary to balance patellofemoral joint and once this was  complete, I had good tracking.  The knee was taken through motion.  The  tibia marked for rotation of the tibial component and then reamed.  All  trials were removed.  Copious irrigation with pulse irrigating device.  Cement placed on all components which were then all firmly hammered into  place and seated.  Polyethylene attached to the tibia and the knee reduced.  All excessive cement removed.  At completion, the knee was examined with  full extension, full flexion, nicely balanced knee at 5 degrees of valgus  with staple ligaments.  Good patellofemoral tracking.  Hemovacs placed,  brought out through separate stab wounds.  Arthrotomy closed with #1 Vicryl.  Skin and subcutaneous tissue with Vicryl and then staples.  Margins of the  wound and knee injected with Marcaine.  Hemovacs clamped.  Sterile  compressive dressing  applied.  Anesthesia reversed.  Brought to recovery  room.  Tolerated surgery well.  No complications.                                               Loreta Ave, M.D.    DFM/MEDQ  D:  02/07/2004  T:  02/07/2004  Job:  [id number]

## 2011-08-08 LAB — APTT: aPTT: 27

## 2011-08-08 LAB — BASIC METABOLIC PANEL
CO2: 21
Calcium: 8.5
GFR calc Af Amer: >60
GFR calc Af Amer: >60
GFR calc non Af Amer: 52 — ABNORMAL LOW
GFR calc non Af Amer: 57 — ABNORMAL LOW
Potassium: 4.1
Sodium: 135
Sodium: 136

## 2011-08-08 LAB — CBC
HCT: 37.8 — ABNORMAL LOW
Hemoglobin: 12.4 — ABNORMAL LOW
Hemoglobin: 13.6
RBC: 4.29
WBC: 8.1
WBC: 8.8

## 2011-08-08 LAB — PROTIME-INR: INR: 1

## 2011-08-11 LAB — GLUCOSE, CAPILLARY

## 2011-08-12 LAB — GLUCOSE, CAPILLARY
Glucose-Capillary: 127 — ABNORMAL HIGH
Glucose-Capillary: 135 — ABNORMAL HIGH

## 2011-08-13 LAB — GLUCOSE, CAPILLARY
Glucose-Capillary: 128 — ABNORMAL HIGH
Glucose-Capillary: 140 — ABNORMAL HIGH

## 2012-03-24 ENCOUNTER — Other Ambulatory Visit: Payer: Self-pay | Admitting: Internal Medicine

## 2012-03-25 ENCOUNTER — Ambulatory Visit
Admission: RE | Admit: 2012-03-25 | Discharge: 2012-03-25 | Disposition: A | Discharge status: Home / Self Care | Payer: Medicare Other | Source: Ambulatory Visit | Attending: Internal Medicine | Admitting: Internal Medicine

## 2012-03-25 ENCOUNTER — Other Ambulatory Visit: Payer: Self-pay

## 2012-03-25 MED ORDER — IOHEXOL 300 MG/ML  SOLN
125.0000 mL | Freq: Once | INTRAMUSCULAR | Status: AC | PRN
  Administered 2012-03-25: 125 mL via INTRAVENOUS

## 2012-04-07 ENCOUNTER — Other Ambulatory Visit: Payer: Self-pay | Admitting: Urology

## 2012-04-14 ENCOUNTER — Encounter (HOSPITAL_COMMUNITY): Payer: Self-pay | Admitting: Pharmacy Technician

## 2012-04-21 ENCOUNTER — Encounter (HOSPITAL_COMMUNITY)
Admission: RE | Admit: 2012-04-21 | Discharge: 2012-04-21 | Disposition: A | Discharge status: Home / Self Care | Payer: Medicare Other | Source: Ambulatory Visit | Attending: Urology | Admitting: Urology

## 2012-04-21 ENCOUNTER — Encounter (HOSPITAL_COMMUNITY): Payer: Self-pay

## 2012-04-21 HISTORY — DX: Unspecified hearing loss, unspecified ear: H91.90

## 2012-04-21 HISTORY — DX: Essential (primary) hypertension: I10

## 2012-04-21 HISTORY — DX: Hypothyroidism, unspecified: E03.9

## 2012-04-21 HISTORY — DX: Chronic kidney disease, unspecified: N18.9

## 2012-04-21 HISTORY — DX: Malignant (primary) neoplasm, unspecified: C80.1

## 2012-04-21 HISTORY — DX: Gastro-esophageal reflux disease without esophagitis: K21.9

## 2012-04-21 LAB — BASIC METABOLIC PANEL
BUN: 33 mg/dL — ABNORMAL HIGH (ref 6–23)
Chloride: 103 mEq/L (ref 96–112)
Creatinine, Ser: 1.5 mg/dL — ABNORMAL HIGH (ref 0.50–1.35)
GFR calc Af Amer: 49 mL/min — ABNORMAL LOW (ref >90)
Glucose, Bld: 127 mg/dL — ABNORMAL HIGH (ref 70–99)
Potassium: 4.5 mEq/L (ref 3.5–5.1)

## 2012-04-21 LAB — CBC
HCT: 46.6 % (ref 39.0–52.0)
Hemoglobin: 15.1 g/dL (ref 13.0–17.0)
RDW: 14.5 % (ref 11.5–15.5)
WBC: 8.6 10*3/uL (ref 4.0–10.5)

## 2012-04-21 MED ORDER — CEFAZOLIN SODIUM 1-5 GM-% IV SOLN: 1.0000 g | INTRAVENOUS | Status: DC

## 2012-04-21 NOTE — Patient Instructions (Addendum)
20 [name]  04/21/2012   Your procedure is scheduled on:  04/29/12 1200noon-245pm  Report to [facility] at 0930 AM.  Call this number if you have problems the morning of surgery: [phone]   Remember:   Do not eat food:After Midnight.  May have clear liquids:until Midnight .   Take these medicines the morning of surgery with A SIP OF WATER:   Do not wear jewelry,   Do not wear lotions, powders, or perfumes.   Do not shave 48 hours prior to surgery. Men may shave face and neck.  Do not bring valuables to the hospital.  Contacts, dentures or bridgework may not be worn into surgery.  Leave suitcase in the car. After surgery it may be brought to your room.  For patients admitted to the hospital, checkout time is 11:00 AM the day of discharge.    Special Instructions: CHG Shower Use Special Wash: 1/2 bottle night before surgery and 1/2 bottle morning of surgery. shower chin to toes with CHG.  Wash face and private parts with regular soap.    Please read over the following fact sheets that you were given: MRSA Information, coughing and deep breathing exercises, leg exercises, Incentive Spirometry Fact Sheet, Blood Transfusion Fact Sheet

## 2012-04-21 NOTE — Pre-Procedure Instructions (Signed)
04/21/12 Patient had a heart rate of 44 on dynamap and 48 manual .  Pulse regular.  Patient voices no complaints. Pulse rate as low as 41 on dynamap.  Heart rate 50 on recent EKG of 04/08/12 done at cardiology office.  Nurse called office of Dr Viann Fish and spoke with nurse at office Olegario Messier).  Olegario Messier spoke with daughter and patient will be evaluated on 04/22/12 at office of Dr Donnie Aho.  Patient aware to notify MD of any dizzines or other problems .  Patient and daughter voiced understanding.

## 2012-04-21 NOTE — Pre-Procedure Instructions (Signed)
04/21/12 Patient 's daughter to call back with verfication regarding Allopurinol dose, Magnesium dose and what time of day Norvasc and Tenormin are taken.

## 2012-04-21 NOTE — Progress Notes (Signed)
04/21/12 1503  OBSTRUCTIVE SLEEP APNEA  Have you ever been diagnosed with sleep apnea through a sleep study? No  Do you snore loudly (loud enough to be heard through closed doors)?  1  Do you often feel tired, fatigued, or sleepy during the daytime? 0  Has anyone observed you stop breathing during your sleep? 0  Do you have, or are you being treated for high blood pressure? 1  BMI more than 35 kg/m2? 1  Age over 76 years old? 1  Neck circumference greater than 40 cm/18 inches? 0  Gender: 1  Obstructive Sleep Apnea Score 5   Score 4 or greater  Updated health history

## 2012-04-21 NOTE — Pre-Procedure Instructions (Signed)
04/21/12 Spoke with nurse of Dr Laverle Patter, Herbert Seta and reported abnormal BUN and CREATININE RESULTS.

## 2012-04-21 NOTE — Pre-Procedure Instructions (Signed)
04/21/12 Daughter called back with clarificaton of medications.  Allopurinol  300mg - pt takes 150 mg daily.  Magnesium 250mg  - pt takes 2 twice daily.  Pt takes Norvasc in the am and Tenormin at nite.

## 2012-04-21 NOTE — Pre-Procedure Instructions (Signed)
Teach Back Method completed for preop appointment teaching.

## 2012-04-22 NOTE — Pre-Procedure Instructions (Signed)
04/22/12 PCR screen sent out . Per lab results will be back on 04/23/12.  Called daughter at [phone] and left her a message instructing her to keep prescription until 04/23/12 and that someone would be calling on 04/23/12 if PCR is positive.

## 2012-04-23 NOTE — Pre-Procedure Instructions (Signed)
Pt's daughter called atenolol changed to 50mg  q pm daily. Pt saw dr Donnie Aho 04-22-2012.

## 2012-04-24 LAB — MRSA CULTURE

## 2012-04-26 ENCOUNTER — Other Ambulatory Visit: Payer: Self-pay | Admitting: Urology

## 2012-04-26 MED ORDER — FLEET ENEMA 7-19 GM/118ML RE ENEM: 1.0000 | ENEMA | Freq: Once | RECTAL | Status: DC

## 2012-04-26 NOTE — Pre-Procedure Instructions (Signed)
04/26/12 Saw Dr Donnie Aho on 04/22/12 due to decreased heart rate in 40s at preop appointment on 04/21/12..  Dr Donnie Aho decreased Atenolol dosage to 50 mg in the evening. EKG and LOV note of 04/22/12 in chart from Dr Donnie Aho visit.

## 2012-04-27 NOTE — Pre-Procedure Instructions (Signed)
04/27/12 Per Debbie in Lab - pcr screen positive for staph and negative for MRSA.  Daughter called at [phone] and instructed her to obtain nasal ointment ( Mupirocin) and for father to use twice daily for 5 days.  Daughter voiced understanding. Also asked daughter about fleets enema clarification with Dr Vevelyn Royals office and daughter said that her and father had been instructed to arrive 30 minutes earlier than stated and fleets enema would be done in Short Stay per Dr Vevelyn Royals office.

## 2012-04-28 NOTE — H&P (Signed)
Chief Complaint  Left renal neoplasm   Reason For Visit  Reason for consult: To consider minimallly invasive treatent of his left renal neoplasm Physician requesting consult: Dr. Su Grand PCP: Dr. Lucky Cowboy   History of Present Illness     [name] is an 78 year old who noted a painless lump in his right flank a couple of weeks ago prompting a CT scan of the abdomen and pelvis with contrast for further evaluation.  He denied hematuria or pain. He has not had any recent weight loss.  His CT demonstrated a possible right flank lipoma but incidentally detected a large 9.6 x 9.2 x 7.8 cm left renal neoplasm that appeared heterogeneous with central necrosis and consistent with renal cell carcinoma. On delayed images, his mass clearly de-enhanced.  The mass encompasses the majority of the lower pole of the kidney and extends up toward the central hilum.  There is no associated regional lymphadenopathy, adrenal masses, and the contralateral kidney appears normal. The left renal vein is well visualized and does not demonstrate evidence of renal vein involvement. There is no other evidence of metastatic disease to the abdomen. He has no family history of kidney cancer.   He does have a history of coronary artery disease and is status post a cardiac stent placement by Dr. Viann Fish over 6 years ago. He does not take Plavix but does take aspirin 81 mg. He is seen today while in a wheelchair although states that he typically will walk with a cane or sometimes with a walker at home. This is related to his arthritis.  His baseline renal function is mildly impaired with a baseline Cr of 1.36.  His LFTs and alkaline phosphatase from 03/24/12 were normal. Hgb was 15.3.   Past Medical History Problems  1. History of  Arthritis V13.4 2. History of  Coronary Artery Disease V12.59 3. History of  Gout 274.9 4. History of  Heartburn 787.1 5. History of  Hypercholesterolemia 272.0 6. History of   Hypertension 401.9  Surgical History Problems  1. History of  Appendectomy 2. History of  Back Surgery 3. History of  Cardiac Cath Procedure Summary 4. History of  Cath Stent Placement 5. History of  Cholecystectomy 6. History of  Eye Surgery Bilateral 7. History of  Knee Replacement Left  Current Meds 1. Allopurinol 300 MG Oral Tablet; Therapy: (Recorded:17May2013) to 2. AmLODIPine Besylate 5 MG Oral Tablet; Therapy: (Recorded:17May2013) to 3. Armour Thyroid TABS; Therapy: (Recorded:17May2013) to 4. Aspirin 81 MG Oral Tablet; Therapy: (Recorded:17May2013) to 5. Atenolol 100 MG Oral Tablet; Therapy: (Recorded:17May2013) to 6. Bumetanide 2 MG Oral Tablet; Therapy: (Recorded:17May2013) to 7. Enalapril Maleate 20 MG Oral Tablet; Therapy: (Recorded:17May2013) to 8. Meloxicam 15 MG Oral Tablet; Therapy: (Recorded:17May2013) to 9. Pravastatin Sodium 40 MG Oral Tablet; Therapy: (Recorded:17May2013) to  Allergies Medication  1. No Known Drug Allergies  Family History Problems  1. Family history of  Colon Cancer V16.0 Denied  2. Family history of  Kidney Cancer  Social History Problems    Alcohol Use   Retired From Work   Tobacco Use 305.1 2 2/1 pk/ quit 41 yrs ago  Review of Systems Genitourinary, constitutional, skin, eye, otolaryngeal, hematologic/lymphatic, cardiovascular, pulmonary, endocrine, musculoskeletal, gastrointestinal, neurological and psychiatric system(s) were reviewed and pertinent findings if present are noted.  Genitourinary: no hematuria.  Constitutional: no night sweats and no recent weight loss.  Cardiovascular: no chest pain and no leg swelling.    Vitals Vital Signs [Data Includes: Last  1 Day]  28May2013 01:47PM  Blood Pressure: 129 / 65 Heart Rate: 47  Physical Exam Constitutional: Well nourished and well developed . No acute distress.  ENT:. The ears and nose are normal in appearance.  Neck: The appearance of the neck is normal and no neck  mass is present.  Pulmonary: No respiratory distress, normal respiratory rhythm and effort and clear bilateral breath sounds.  Cardiovascular: Heart rate and rhythm are normal . No peripheral edema.  Abdomen: Incision site(s) well healed. The abdomen is mildly obese. The abdomen is soft and nontender. No masses are palpated. No CVA tenderness. No hernias are palpable. No hepatosplenomegaly noted.  Lymphatics: The supraclavicular, femoral and inguinal nodes are not enlarged or tender.  Skin: Normal skin turgor, no visible rash and no visible skin lesions.  Neuro/Psych:. Mood and affect are appropriate.    Results/Data  I independently reviewed his CT scan of the abdomen and pelvis with findings as dictated above.     Assessment Assessed  1. Renal Neoplasm 239.5  Plan Renal Neoplasm (239.5)  1. BASIC METABOLIC PANEL  Requested for: 28May2013 2. CT-CHEST W/O CONTRAST  Requested for: 28May2013 3. Follow-up Schedule Surgery Office  Follow-up  Requested for: 28May2013  Discussion/Summary  1. Left renal neoplasm concerning for probable renal cell carcinoma:   The patient was provided information regarding their renal mass including the relative risk of benign versus malignant pathology and the natural history of renal cell carcinoma and other possible malignancies of the kidney. The role of renal biopsy, laboratory testing, and imaging studies to further characterize renal masses and/or the presence of metastatic disease were explained. We discussed the role of active surveillance, surgical therapy with both radical nephrectomy and nephron-sparing surgery, and ablative therapy in the treatment of renal masses. In addition, we discussed our goals of providing an accurate diagnosis and oncologic control while maintaining optimal renal function as appropriate based on the size, location, and complexity of their renal mass as well as their co-morbidities.    We have discussed the risks of treatment in  detail including but not limited to bleeding, infection, heart attack, stroke, death, venothromoboembolism, cancer recurrence, injury/damage to surrounding organs and structures, urine leak, the possibility of open surgical conversion for patients undergoing minimally invasive surgery, the risk of developing chronic kidney disease and its associated implications, and the potential risk of end stage renal disease possibly necessitating dialysis.   He will proceed with further staging evaluation included a CT scan of the chest considering his large left renal mass. His renal function will also be rechecked today. He will need to undergo cardiac risk assessment and was actually scheduled to see Dr. Donnie Aho today but forgot about his appointment. We will work on helping him reschedule his appointment for cardiac risk assessment and assuming he has no evidence for metastatic disease, we have discussed proceeding with a laparoscopic left radical nephrectomy. He gives his informed consent to proceed. Considering his history of a cardiac stent, I will plan to keep him on aspirin 81 mg perioperatively.  CC: Dr. Lucky Cowboy Dr. Su Grand Dr. Viann Fish    Verified Results CT-CHEST W/O CONTRAST2 03Jun2013 12:00AM2 Randal Buba  [Apr 12, 2012 4:38PM Laverle Patter, Dakoda Laventure] Please inform patient that Chest CT scan was normal and negative for any signs of metastatic cancer. Good news. I recommend proceeding as planned with surgery.   Test Name Result Flag Reference  ** RADIOLOGY REPORT BY [facility] ** ORIGINAL APPROVED BY: MELINDA A. BLIETZ, M.D.  ON: 04/12/2012 14:59:50   *RADIOLOGY REPORT*  Clinical Data: History of renal mass.  CT CHEST WITHOUT CONTRAST  Technique: Multidetector CT imaging of the chest was performed following the standard protocol without IV contrast.  Comparison: CT abdomen pelvis 03/25/2012.  Findings: No pathologically enlarged mediastinal or axillary lymph nodes.  Hilar regions are difficult to definitively evaluate without IV contrast. Atherosclerotic calcification of the arterial vasculature, including extensive involvement of the coronary arteries. Heart is mildly enlarged. No pericardial effusion.  Minimal atelectasis and/or scarring in the right middle lobe, lingula and both lower lobes. No pulmonary nodules. No pleural fluid. Airway is unremarkable.  Incidental imaging of the upper abdomen incompletely images a left renal mass, measuring at least 7.7 cm. No worrisome lytic or sclerotic lesions. Degenerative changes are seen in the spine.  IMPRESSION:  1. No evidence of metastatic disease in the chest. 2. Left renal mass, better imaged on 03/25/2012.   BASIC METABOLIC PANEL1 28May2013 02:59PM1 Lyda Perone  SPECIMEN TYPE: BLOOD   Test Name Result Flag Reference  GLUCOSE1 109 mg/dL1 H1 70-991  BUN1 31 mg/dL1 H1 6-231  CREATININE1 1.43 mg/dL1  0.50-1.501  SODIUM1 138 mEq/L1  [phone]  POTASSIUM1 4.9 mEq/L1  3.5-5.31  CHLORIDE1 105 mEq/L1  96-1121  CO21 25 mEq/L1  19-321  CALCIUM1 8.9 mg/dL1  8.4-10.51  Est GFR, African American1 53 mL/min1 L1   Est GFR, NonAfrican American1 46 mL/min1 L1   The estimated GFR is a calculation valid for adults (4 to 76 years old) that uses the CKD-EPI algorithm to adjust for age and sex. It is not to be used for children, pregnant women, hospitalized patients, patients on dialysis, or with rapidly changing kidney function. According to the NKDEP, eGFR >89 is normal, 60-89 shows mild impairment, 30-59 shows moderate impairment, 15-29 shows severe impairment and <15 is ESRD.     1. Amended By: Heloise Purpura; 04/07/2012 6:50 AMEST  2. Amended By: Heloise Purpura; 04/12/2012 4:38 PMEST

## 2012-04-29 ENCOUNTER — Encounter (HOSPITAL_COMMUNITY)
Admission: RE | Disposition: A | Discharge status: Home / Self Care | Payer: Self-pay | Source: Ambulatory Visit | Attending: Urology

## 2012-04-29 ENCOUNTER — Encounter (HOSPITAL_COMMUNITY): Payer: Self-pay | Admitting: Certified Registered Nurse Anesthetist

## 2012-04-29 ENCOUNTER — Other Ambulatory Visit: Payer: Self-pay | Admitting: Urology

## 2012-04-29 ENCOUNTER — Encounter (HOSPITAL_COMMUNITY): Payer: Self-pay | Admitting: *Deleted

## 2012-04-29 ENCOUNTER — Ambulatory Visit (HOSPITAL_COMMUNITY): Payer: Medicare Other | Admitting: Certified Registered Nurse Anesthetist

## 2012-04-29 ENCOUNTER — Inpatient Hospital Stay (HOSPITAL_COMMUNITY)
Admission: RE | Admit: 2012-04-29 | Discharge: 2012-05-02 | DRG: 658 | Disposition: A | Discharge status: Home / Self Care | Payer: Medicare Other | Source: Ambulatory Visit | Attending: Urology | Admitting: Urology

## 2012-04-29 DIAGNOSIS — I2510 Atherosclerotic heart disease of native coronary artery without angina pectoris: Secondary | ICD-10-CM | POA: Diagnosis present

## 2012-04-29 DIAGNOSIS — C649 Malignant neoplasm of unspecified kidney, except renal pelvis: Principal | ICD-10-CM | POA: Diagnosis present

## 2012-04-29 DIAGNOSIS — I10 Essential (primary) hypertension: Secondary | ICD-10-CM | POA: Diagnosis present

## 2012-04-29 DIAGNOSIS — Z96659 Presence of unspecified artificial knee joint: Secondary | ICD-10-CM

## 2012-04-29 DIAGNOSIS — Z79899 Other long term (current) drug therapy: Secondary | ICD-10-CM

## 2012-04-29 DIAGNOSIS — E7800 Pure hypercholesterolemia, unspecified: Secondary | ICD-10-CM | POA: Diagnosis present

## 2012-04-29 DIAGNOSIS — M109 Gout, unspecified: Secondary | ICD-10-CM | POA: Diagnosis present

## 2012-04-29 HISTORY — PX: LAPAROSCOPIC NEPHRECTOMY: SHX1930

## 2012-04-29 LAB — TYPE AND SCREEN: Antibody Screen: NEGATIVE

## 2012-04-29 LAB — BASIC METABOLIC PANEL
Calcium: 8.3 mg/dL — ABNORMAL LOW (ref 8.4–10.5)
Chloride: 104 mEq/L (ref 96–112)
Creatinine, Ser: 1.54 mg/dL — ABNORMAL HIGH (ref 0.50–1.35)
GFR calc Af Amer: 47 mL/min — ABNORMAL LOW (ref >90)
Sodium: 134 mEq/L — ABNORMAL LOW (ref 135–145)

## 2012-04-29 LAB — HEMOGLOBIN AND HEMATOCRIT, BLOOD: Hemoglobin: 13.5 g/dL (ref 13.0–17.0)

## 2012-04-29 SURGERY — NEPHRECTOMY, RADICAL, LAPAROSCOPIC, ADULT
Anesthesia: General | Site: Abdomen | Laterality: Left | Wound class: Clean Contaminated

## 2012-04-29 MED ORDER — ENALAPRIL MALEATE 20 MG PO TABS
20.0000 mg | ORAL_TABLET | Freq: Every day | ORAL | Status: DC
  Administered 2012-04-29 – 2012-05-02 (×4): 20 mg via ORAL
  Filled 2012-04-29 (×5): qty 1

## 2012-04-29 MED ORDER — SODIUM CHLORIDE 0.9 % IV SOLN
INTRAVENOUS | Status: DC
  Administered 2012-04-29: 1000 mL via INTRAVENOUS

## 2012-04-29 MED ORDER — ACETAMINOPHEN 10 MG/ML IV SOLN
INTRAVENOUS | Status: DC | PRN
  Administered 2012-04-29: 1000 mg via INTRAVENOUS

## 2012-04-29 MED ORDER — LACTATED RINGERS IV SOLN
INTRAVENOUS | Status: DC | PRN
  Administered 2012-04-29 (×3): via INTRAVENOUS

## 2012-04-29 MED ORDER — SIMVASTATIN 20 MG PO TABS
20.0000 mg | ORAL_TABLET | Freq: Every day | ORAL | Status: DC
  Administered 2012-04-29 – 2012-05-01 (×3): 20 mg via ORAL
  Filled 2012-04-29 (×4): qty 1

## 2012-04-29 MED ORDER — ROCURONIUM BROMIDE 100 MG/10ML IV SOLN
INTRAVENOUS | Status: DC | PRN
  Administered 2012-04-29 (×2): 10 mg via INTRAVENOUS
  Administered 2012-04-29: 50 mg via INTRAVENOUS

## 2012-04-29 MED ORDER — HYDROMORPHONE HCL PF 1 MG/ML IJ SOLN: 0.5000 mg | INTRAMUSCULAR | Status: DC | PRN

## 2012-04-29 MED ORDER — NEOSTIGMINE METHYLSULFATE 1 MG/ML IJ SOLN
INTRAMUSCULAR | Status: DC | PRN
  Administered 2012-04-29: 3 mg via INTRAVENOUS

## 2012-04-29 MED ORDER — DOCUSATE SODIUM 100 MG PO CAPS
100.0000 mg | ORAL_CAPSULE | Freq: Two times a day (BID) | ORAL | Status: DC
  Administered 2012-04-29 – 2012-05-01 (×5): 100 mg via ORAL
  Filled 2012-04-29 (×7): qty 1

## 2012-04-29 MED ORDER — LIDOCAINE HCL (CARDIAC) 20 MG/ML IV SOLN
INTRAVENOUS | Status: DC | PRN
  Administered 2012-04-29: 50 mg via INTRAVENOUS

## 2012-04-29 MED ORDER — LACTATED RINGERS IR SOLN
Status: DC | PRN
  Administered 2012-04-29: 1

## 2012-04-29 MED ORDER — BUMETANIDE 1 MG PO TABS
1.0000 mg | ORAL_TABLET | Freq: Every day | ORAL | Status: DC
  Administered 2012-04-29 – 2012-05-02 (×4): 1 mg via ORAL
  Filled 2012-04-29 (×4): qty 1

## 2012-04-29 MED ORDER — HYDROMORPHONE HCL PF 1 MG/ML IJ SOLN
INTRAMUSCULAR | Status: DC | PRN
  Administered 2012-04-29 (×4): 0.5 mg via INTRAVENOUS

## 2012-04-29 MED ORDER — ACETAMINOPHEN 10 MG/ML IV SOLN
INTRAVENOUS | Status: AC
  Filled 2012-04-29: qty 100

## 2012-04-29 MED ORDER — HYDROMORPHONE HCL PF 1 MG/ML IJ SOLN: 0.2500 mg | INTRAMUSCULAR | Status: DC | PRN

## 2012-04-29 MED ORDER — LEVOTHYROXINE SODIUM 100 MCG PO TABS
100.0000 ug | ORAL_TABLET | Freq: Every day | ORAL | Status: DC
  Administered 2012-04-29 – 2012-05-02 (×4): 100 ug via ORAL
  Filled 2012-04-29 (×4): qty 1

## 2012-04-29 MED ORDER — FENTANYL CITRATE 0.05 MG/ML IJ SOLN
INTRAMUSCULAR | Status: DC | PRN
  Administered 2012-04-29: 25 ug via INTRAVENOUS
  Administered 2012-04-29: 100 ug via INTRAVENOUS
  Administered 2012-04-29: 25 ug via INTRAVENOUS
  Administered 2012-04-29: 100 ug via INTRAVENOUS
  Administered 2012-04-29 (×2): 25 ug via INTRAVENOUS
  Administered 2012-04-29: 50 ug via INTRAVENOUS

## 2012-04-29 MED ORDER — DIPHENHYDRAMINE HCL 12.5 MG/5ML PO ELIX: 12.5000 mg | ORAL_SOLUTION | Freq: Four times a day (QID) | ORAL | Status: DC | PRN

## 2012-04-29 MED ORDER — ONDANSETRON HCL 4 MG/2ML IJ SOLN
4.0000 mg | INTRAMUSCULAR | Status: DC | PRN
  Administered 2012-05-02: 4 mg via INTRAVENOUS
  Filled 2012-04-29: qty 2

## 2012-04-29 MED ORDER — FLEET ENEMA 7-19 GM/118ML RE ENEM: 1.0000 | ENEMA | Freq: Once | RECTAL | Status: DC

## 2012-04-29 MED ORDER — EPHEDRINE SULFATE 50 MG/ML IJ SOLN
INTRAMUSCULAR | Status: DC | PRN
  Administered 2012-04-29 (×2): 10 mg via INTRAVENOUS

## 2012-04-29 MED ORDER — ONDANSETRON HCL 4 MG/2ML IJ SOLN
INTRAMUSCULAR | Status: DC | PRN
  Administered 2012-04-29: 4 mg via INTRAVENOUS

## 2012-04-29 MED ORDER — FLEET ENEMA 7-19 GM/118ML RE ENEM
ENEMA | RECTAL | Status: AC
  Administered 2012-04-29: 1 via RECTAL
  Filled 2012-04-29: qty 1

## 2012-04-29 MED ORDER — DIPHENHYDRAMINE HCL 50 MG/ML IJ SOLN: 12.5000 mg | Freq: Four times a day (QID) | INTRAMUSCULAR | Status: DC | PRN

## 2012-04-29 MED ORDER — BUPIVACAINE LIPOSOME 1.3 % IJ SUSP
INTRAMUSCULAR | Status: DC | PRN
  Administered 2012-04-29: 20 mL

## 2012-04-29 MED ORDER — ZOLPIDEM TARTRATE 5 MG PO TABS: 5.0000 mg | ORAL_TABLET | Freq: Every evening | ORAL | Status: DC | PRN

## 2012-04-29 MED ORDER — CEFAZOLIN SODIUM-DEXTROSE 2-3 GM-% IV SOLR
2.0000 g | Freq: Once | INTRAVENOUS | Status: AC
  Administered 2012-04-29: 2 g via INTRAVENOUS

## 2012-04-29 MED ORDER — BUPIVACAINE LIPOSOME 1.3 % IJ SUSP
20.0000 mL | Freq: Once | INTRAMUSCULAR | Status: DC
  Filled 2012-04-29: qty 20

## 2012-04-29 MED ORDER — KETAMINE HCL 10 MG/ML IJ SOLN
INTRAMUSCULAR | Status: DC | PRN
  Administered 2012-04-29: 25 mg via INTRAVENOUS

## 2012-04-29 MED ORDER — ASPIRIN EC 81 MG PO TBEC
81.0000 mg | DELAYED_RELEASE_TABLET | Freq: Every day | ORAL | Status: DC
  Administered 2012-04-29 – 2012-05-02 (×4): 81 mg via ORAL
  Filled 2012-04-29 (×4): qty 1

## 2012-04-29 MED ORDER — GLYCOPYRROLATE 0.2 MG/ML IJ SOLN
INTRAMUSCULAR | Status: DC | PRN
  Administered 2012-04-29: .4 mg via INTRAVENOUS

## 2012-04-29 MED ORDER — CEFAZOLIN SODIUM-DEXTROSE 2-3 GM-% IV SOLR
INTRAVENOUS | Status: AC
  Filled 2012-04-29: qty 50

## 2012-04-29 MED ORDER — CEFAZOLIN SODIUM 1-5 GM-% IV SOLN
1.0000 g | Freq: Three times a day (TID) | INTRAVENOUS | Status: AC
  Administered 2012-04-29 – 2012-04-30 (×2): 1 g via INTRAVENOUS
  Filled 2012-04-29 (×2): qty 50

## 2012-04-29 MED ORDER — AMLODIPINE BESYLATE 5 MG PO TABS
5.0000 mg | ORAL_TABLET | Freq: Every day | ORAL | Status: DC
  Administered 2012-04-30 – 2012-05-02 (×3): 5 mg via ORAL
  Filled 2012-04-29 (×3): qty 1

## 2012-04-29 MED ORDER — ALLOPURINOL 300 MG PO TABS
300.0000 mg | ORAL_TABLET | Freq: Every day | ORAL | Status: DC
  Administered 2012-04-29 – 2012-05-02 (×4): 300 mg via ORAL
  Filled 2012-04-29 (×4): qty 1

## 2012-04-29 MED ORDER — PROPOFOL 10 MG/ML IV EMUL
INTRAVENOUS | Status: DC | PRN
  Administered 2012-04-29: 150 mg via INTRAVENOUS

## 2012-04-29 MED ORDER — ACETAMINOPHEN 10 MG/ML IV SOLN
1000.0000 mg | Freq: Four times a day (QID) | INTRAVENOUS | Status: AC
  Administered 2012-04-29 – 2012-04-30 (×4): 1000 mg via INTRAVENOUS
  Filled 2012-04-29 (×4): qty 100

## 2012-04-29 MED ORDER — ATENOLOL 50 MG PO TABS
50.0000 mg | ORAL_TABLET | Freq: Every evening | ORAL | Status: DC
  Administered 2012-04-29 – 2012-05-01 (×3): 50 mg via ORAL
  Filled 2012-04-29 (×4): qty 1

## 2012-04-29 MED ORDER — DEXTROSE-NACL 5-0.45 % IV SOLN
INTRAVENOUS | Status: DC
  Administered 2012-04-29: 1000 mL via INTRAVENOUS
  Administered 2012-04-30: via INTRAVENOUS

## 2012-04-29 SURGICAL SUPPLY — 66 items
ADH SKN CLS APL DERMABOND .7 (GAUZE/BANDAGES/DRESSINGS)
APL SKNCLS STERI-STRIP NONHPOA (GAUZE/BANDAGES/DRESSINGS)
BAG SPEC THK2 15X12 ZIP CLS (MISCELLANEOUS)
BAG ZIPLOCK 12X15 (MISCELLANEOUS) ×1 IMPLANT
BENZOIN TINCTURE PRP APPL 2/3 (GAUZE/BANDAGES/DRESSINGS) ×1 IMPLANT
BLADE EXTENDED COATED 6.5IN (ELECTRODE) IMPLANT
BLADE SURG SZ10 CARB STEEL (BLADE) ×2 IMPLANT
CANISTER SUCTION 2500CC (MISCELLANEOUS) ×2 IMPLANT
CHLORAPREP W/TINT 26ML (MISCELLANEOUS) ×2 IMPLANT
CLIP LIGATING HEM O LOK PURPLE (MISCELLANEOUS) ×4 IMPLANT
CLIP LIGATING HEMO LOK XL GOLD (MISCELLANEOUS) IMPLANT
CLIP LIGATING HEMO O LOK GREEN (MISCELLANEOUS) ×2 IMPLANT
CLOTH BEACON ORANGE TIMEOUT ST (SAFETY) ×2 IMPLANT
CORD HIGH FREQUENCY UNIPOLAR (ELECTROSURGICAL) ×1 IMPLANT
COVER SURGICAL LIGHT HANDLE (MISCELLANEOUS) ×3 IMPLANT
CUTTER FLEX LINEAR 45M (STAPLE) ×1 IMPLANT
DERMABOND ADVANCED (GAUZE/BANDAGES/DRESSINGS)
DERMABOND ADVANCED .7 DNX12 (GAUZE/BANDAGES/DRESSINGS) ×1 IMPLANT
DRAIN CHANNEL 10F 3/8 F FF (DRAIN) IMPLANT
DRAPE CAMERA CLOSED 9X96 (DRAPES) ×1 IMPLANT
DRAPE INCISE IOBAN 66X45 STRL (DRAPES) ×2 IMPLANT
DRAPE LAPAROSCOPIC ABDOMINAL (DRAPES) ×2 IMPLANT
DRAPE WARM FLUID 44X44 (DRAPE) ×1 IMPLANT
DRSG TEGADERM 4X4.75 (GAUZE/BANDAGES/DRESSINGS) ×2 IMPLANT
ELECT REM PT RETURN 9FT ADLT (ELECTROSURGICAL) ×2
ELECTRODE REM PT RTRN 9FT ADLT (ELECTROSURGICAL) ×1 IMPLANT
EVACUATOR SILICONE 100CC (DRAIN) IMPLANT
FLOSEAL (HEMOSTASIS) IMPLANT
FLOSEAL 10ML (HEMOSTASIS) ×1 IMPLANT
GLOVE BIOGEL M STRL SZ7.5 (GLOVE) ×2 IMPLANT
GOWN STRL NON-REIN LRG LVL3 (GOWN DISPOSABLE) ×4 IMPLANT
HEMOSTAT SURGICEL 4X8 (HEMOSTASIS) ×1 IMPLANT
KIT BASIN OR (CUSTOM PROCEDURE TRAY) ×2 IMPLANT
PENCIL BUTTON HOLSTER BLD 10FT (ELECTRODE) ×2 IMPLANT
POSITIONER SURGICAL ARM (MISCELLANEOUS) ×2 IMPLANT
POUCH ENDO CATCH II 15MM (MISCELLANEOUS) ×3 IMPLANT
RELOAD 45 VASCULAR/THIN (ENDOMECHANICALS) ×2 IMPLANT
RETRACTOR LAPSCP 12X46 CVD (ENDOMECHANICALS) IMPLANT
RTRCTR LAPSCP 12X46 CVD (ENDOMECHANICALS)
SCALPEL HARMONIC ACE (MISCELLANEOUS) ×2 IMPLANT
SCISSORS LAP 5X35 DISP (ENDOMECHANICALS) IMPLANT
SET IRRIG TUBING LAPAROSCOPIC (IRRIGATION / IRRIGATOR) ×2 IMPLANT
SOLUTION ANTI FOG 6CC (MISCELLANEOUS) ×2 IMPLANT
SPONGE GAUZE 4X4 12PLY (GAUZE/BANDAGES/DRESSINGS) ×1 IMPLANT
SPONGE LAP 18X18 X RAY DECT (DISPOSABLE) ×2 IMPLANT
SPONGE LAP 4X18 X RAY DECT (DISPOSABLE) ×1 IMPLANT
SPONGE SURGIFOAM ABS GEL 100 (HEMOSTASIS) IMPLANT
STRIP CLOSURE SKIN 1/4X4 (GAUZE/BANDAGES/DRESSINGS) IMPLANT
SURGIFLO W/THROMBIN 8M KIT (HEMOSTASIS) ×1 IMPLANT
SUT ETHILON 3 0 PS 1 (SUTURE) IMPLANT
SUT MNCRL AB 4-0 PS2 18 (SUTURE) ×4 IMPLANT
SUT PDS AB 1 CTX 36 (SUTURE) ×2 IMPLANT
SUT VIC AB 2-0 SH 27 (SUTURE) ×2
SUT VIC AB 2-0 SH 27X BRD (SUTURE) ×2 IMPLANT
SUT VICRYL 0 UR6 27IN ABS (SUTURE) ×5 IMPLANT
TAPE STRIPS DRAPE STRL (GAUZE/BANDAGES/DRESSINGS) ×1 IMPLANT
TOWEL OR 17X26 10 PK STRL BLUE (TOWEL DISPOSABLE) ×2 IMPLANT
TOWEL OR NON WOVEN STRL DISP B (DISPOSABLE) ×2 IMPLANT
TRAY FOLEY CATH 14FRSI W/METER (CATHETERS) ×2 IMPLANT
TRAY LAP CHOLE (CUSTOM PROCEDURE TRAY) ×2 IMPLANT
TROCAR BLADELESS OPT 5 100 (ENDOMECHANICALS) ×2 IMPLANT
TROCAR BLADELESS OPT 5 75 (ENDOMECHANICALS) ×1 IMPLANT
TROCAR XCEL 12X100 BLDLESS (ENDOMECHANICALS) ×2 IMPLANT
TROCAR XCEL BLUNT TIP 100MML (ENDOMECHANICALS) ×2 IMPLANT
TUBING INSUFFLATION 10FT LAP (TUBING) ×2 IMPLANT
YANKAUER SUCT BULB TIP 10FT TU (MISCELLANEOUS) ×2 IMPLANT

## 2012-04-29 NOTE — Progress Notes (Signed)
Fleets enema done in short stay.  Pt had minimal results, mostly water stool.

## 2012-04-29 NOTE — Preoperative (Signed)
Beta Blockers   Reason not to administer Beta Blockers:Not Applicable, took BB this am 

## 2012-04-29 NOTE — Progress Notes (Signed)
Patient ID: [name], male   DOB: 1929/01/08, 78 y.o.   MRN: [id number]  Post-op note  Subjective: The patient is doing well.  No complaints.  Objective: Vital signs in last 24 hours: Temp:  [97.2 F (36.2 C)-97.5 F (36.4 C)] 97.4 F (36.3 C) (06/20 1645) Pulse Rate:  [47-66] 66  (06/20 1645) Resp:  [8-15] 13  (06/20 1645) BP: (99-164)/(37-67) 143/67 mmHg (06/20 1645) SpO2:  [95 %-100 %] 95 % (06/20 1645) Weight:  [116.212 kg (256 lb 3.2 oz)] 116.212 kg (256 lb 3.2 oz) (06/20 1645)  Intake/Output from previous day:   Intake/Output this shift: Total I/O In: 3000 [I.V.:3000] Out: 750 [Urine:350; Blood:400]  Physical Exam:  General: Alert and oriented. Abdomen: Soft, Nondistended. Incisions: Clean and dry.  Lab Results:  Basename 04/29/12 1610  HGB 13.5  HCT 42.2    Assessment/Plan: POD#0   1) Continue to monitor   Moody Bruins. MD   LOS: 0 days   Rayni Nemitz,LES 04/29/2012, 6:39 PM

## 2012-04-29 NOTE — Progress Notes (Signed)
Pt is noted to have a dime size pressure ulcer on right inner buttocks area.  Skin is open without drainage.  Pt states he does feel discomfort at that area.

## 2012-04-29 NOTE — Transfer of Care (Signed)
Immediate Anesthesia Transfer of Care Note  Patient: [name]  Procedure(s) Performed: Procedure(s) (LRB): LAPAROSCOPIC NEPHRECTOMY (Left)  Patient Location: PACU  Anesthesia Type: General  Level of Consciousness: awake, alert , oriented, patient cooperative and responds to stimulation  Airway & Oxygen Therapy: spontaneous breathing, face mask  Post-op Assessment: Report given to PACU RN, Post -op Vital signs reviewed and stable and Patient moving all extremities  Post vital signs: Reviewed and stable  Complications: No apparent anesthesia complications

## 2012-04-29 NOTE — Interval H&P Note (Signed)
History and Physical Interval Note:  04/29/2012 12:08 PM  [name]  has presented today for surgery, with the diagnosis of LEFT RENAL NEOPLASM  The various methods of treatment have been discussed with the patient and family. After consideration of risks, benefits and other options for treatment, the patient has consented to  Procedure(s) (LRB): LAPAROSCOPIC NEPHRECTOMY (Left) as a surgical intervention .  The patient's history has been reviewed, patient examined, no change in status, stable for surgery.  I have reviewed the patients' chart and labs.  Questions were answered to the patient's satisfaction.     Donyetta Ogletree,LES

## 2012-04-29 NOTE — Anesthesia Preprocedure Evaluation (Addendum)
Anesthesia Evaluation  Patient identified by MRN, date of birth, ID band Patient awake    Reviewed: Allergy & Precautions, H&P , NPO status , Patient's Chart, lab work & pertinent test results  Airway Mallampati: II TM Distance: >3 FB Neck ROM: Full    Dental  (+) Edentulous Upper and Edentulous Lower   Pulmonary neg pulmonary ROS,  breath sounds clear to auscultation  Pulmonary exam normal       Cardiovascular hypertension, Pt. on home beta blockers and Pt. on medications + CAD and + Cardiac Stents Rhythm:Regular Rate:Normal  He does have a history of coronary artery disease and is status post a cardiac stent placement by Dr. Viann Fish over 6 years ago. He does not take Plavix but does take aspirin 81 mg.He typically will walk with a cane or sometimes with a walker at home. This is related to his arthritis.    Neuro/Psych negative neurological ROS  negative psych ROS   GI/Hepatic Neg liver ROS, GERD-  Medicated,  Endo/Other  Hypothyroidism   Renal/GU negative Renal ROS  negative genitourinary   Musculoskeletal negative musculoskeletal ROS (+)   Abdominal (+) + obese,   Peds negative pediatric ROS (+)  Hematology negative hematology ROS (+)   Anesthesia Other Findings Bilateral hearing aids.  Reproductive/Obstetrics negative OB ROS                          Anesthesia Physical Anesthesia Plan  ASA: III  Anesthesia Plan: General   Post-op Pain Management:    Induction: Intravenous  Airway Management Planned: Oral ETT  Additional Equipment:   Intra-op Plan:   Post-operative Plan: Extubation in OR  Informed Consent: I have reviewed the patients History and Physical, chart, labs and discussed the procedure including the risks, benefits and alternatives for the proposed anesthesia with the patient or authorized representative who has indicated his/her understanding and acceptance.    Dental advisory given  Plan Discussed with: CRNA  Anesthesia Plan Comments:         Anesthesia Quick Evaluation

## 2012-04-29 NOTE — Anesthesia Postprocedure Evaluation (Signed)
  Anesthesia Post-op Note  Patient: [name]  Procedure(s) Performed: Procedure(s) (LRB): LAPAROSCOPIC NEPHRECTOMY (Left)  Patient Location: PACU  Anesthesia Type: General  Level of Consciousness: awake and alert   Airway and Oxygen Therapy: Patient Spontanous Breathing  Post-op Pain: mild  Post-op Assessment: Post-op Vital signs reviewed, Patient's Cardiovascular Status Stable, Respiratory Function Stable, Patent Airway and No signs of Nausea or vomiting  Post-op Vital Signs: stable  Complications: No apparent anesthesia complications

## 2012-04-29 NOTE — Op Note (Signed)
Preoperative diagnosis: Left renal neoplasm  Postoperative diagnosis: Left renal neoplasm  Procedure: 1.  Left laparoscopic radical nephrectomy  Surgeon: Moody Bruins. M.D.  Assistant(s): Pecola Leisure, PA-C  Anesthesia: General  Complications: None  EBL: 400 mL  IVF:  2300 mL crystalloid  Specimens: 1. Left kidney 2. Retroperitoneal lymph node tissue  Disposition of specimens: Pathology  Indication: [name] is a 78 y.o. patient with a left renal tumor suspicious for malignancy.  After a thorough review of the management options for their renal mass, they elected to proceed with surgical treatment and the above procedure.  We have discussed the potential benefits and risks of the procedure, side effects of the proposed treatment, the likelihood of the patient achieving the goals of the procedure, and any potential problems that might occur during the procedure or recuperation. Informed consent has been obtained.  Description of procedure:  The patient was taken to the operating room and a general anesthetic was administered. The patient was given preoperative antibiotics, placed in the right modified flank position, and prepped and draped in the usual sterile fashion. Next a preoperative timeout was performed.  A site was selected near the umbilicus for placement of the camera port. This was placed using a standard open Hassan technique which allowed entry into the peritoneal cavity under direct vision and without difficulty. A 12 mm Hassan cannula was placed and a pneumoperitoneum established. The camera was then used to inspect the abdomen and there was no evidence of any intra-abdominal injuries or other abnormalities. The remaining abdominal ports were then placed. A 12 mm port was placed in the left lower quadrant and a 5 mm port was placed in the left upper quadrant.  All ports were placed under direct vision without difficulty.  Utilizing the harmonic  scalpel, the white line of Toldt was incised allowing the colon to be reflected medially and the plane between the mesocolon and the anterior layer of Gerota's fascia to be developed and the kidney exposed.  The ureter and gonadal vein were identified inferiorly and the ureter was lifted anteriorly off the psoas muscle.  Dissection proceeded superiorly along the gonadal vein until the renal vein was identified.  The renal hilum was then carefully isolated with a combination of blunt and sharp dissectiong allowing the renal arterial and venous structures to be separated and isolated.   The renal artery was isolated and ligated with multiple Weck clips and subsequently divided.  The renal vein was then isolated and also ligated and divided with a 45 mm Flex ETS stapler.  Gerota's fascia was intentionally entered superiorly and the space between the adrenal gland and the kidney was developed allowing the adrenal gland to be spared.  The splenorenal ligaments were divided with the harmonic scalpel.  The lateral and posterior attachements to the kidney were then divided.  The ureter was ligated with Weck clips and divided allowing the specimen to be freed from all surrounding structures.  There was an area of mild bleeding near the regional hilar lymph node tissue.  This was controlled with Weck clips and this tissue was removed and sent as a specimen.  There remained a small amount of oozing which was then controlled with Surgiflo and Surgicel resulting in excellent hemostasis.  An attempt was then made to place the kidney specimen into a 15 mm Endocatch II retrieval bag. However, the kidney and mass were too large to fit in the bag.   The renal hilum, liver, adrenal  bed and gonadal vein areas were each inspected and hemostasis was ensured with the pneomperitoneal pressures lowered.  The 12 mm lower quadrant port was then closed with a 0-vicryl suture placed laparoscopically to close the fascia of this incision.  All remaining ports were removed under direct vision.  The kidney specimen was then removed intact via the camera port site after this incision was extended to allow the mass and kidney to be removed. This fascial opening was then closed with two #1 PDS sutures.    All incisions were injected with local anesthetic and reapproximated at the skin with 4-0 monocryl sutures.  Dermabond was applied to the skin. The patient tolerated the procedure well and without complications and was transferred to the recovery unit in satisfactory condition.   Moody Bruins MD

## 2012-04-29 NOTE — Discharge Instructions (Signed)

## 2012-04-30 ENCOUNTER — Encounter (HOSPITAL_COMMUNITY): Payer: Self-pay | Admitting: Urology

## 2012-04-30 LAB — BASIC METABOLIC PANEL
BUN: 32 mg/dL — ABNORMAL HIGH (ref 6–23)
Creatinine, Ser: 1.79 mg/dL — ABNORMAL HIGH (ref 0.50–1.35)
GFR calc Af Amer: 39 mL/min — ABNORMAL LOW (ref >90)
GFR calc non Af Amer: 34 mL/min — ABNORMAL LOW (ref >90)

## 2012-04-30 MED ORDER — MUPIROCIN CALCIUM 2 % EX CREA
TOPICAL_CREAM | Freq: Two times a day (BID) | CUTANEOUS | Status: DC
  Administered 2012-04-30 – 2012-05-01 (×2): via TOPICAL
  Administered 2012-05-01: 1 via TOPICAL
  Administered 2012-05-02: 10:00:00 via TOPICAL
  Filled 2012-04-30: qty 15

## 2012-04-30 MED ORDER — HYDROCODONE-ACETAMINOPHEN 5-325 MG PO TABS: 1.0000 | ORAL_TABLET | Freq: Four times a day (QID) | ORAL | Status: DC | PRN

## 2012-04-30 MED ORDER — BISACODYL 10 MG RE SUPP
10.0000 mg | Freq: Once | RECTAL | Status: AC
  Administered 2012-04-30: 10 mg via RECTAL
  Filled 2012-04-30: qty 1

## 2012-04-30 MED ORDER — HYDROCODONE-ACETAMINOPHEN 5-325 MG PO TABS: 1.0000 | ORAL_TABLET | Freq: Four times a day (QID) | ORAL | Status: AC | PRN

## 2012-04-30 MED ORDER — BELLADONNA ALKALOIDS-OPIUM 16.2-60 MG RE SUPP
1.0000 | Freq: Once | RECTAL | Status: AC
  Administered 2012-04-30: 1 via RECTAL
  Filled 2012-04-30: qty 1

## 2012-04-30 MED ORDER — DOCUSATE SODIUM 100 MG PO CAPS: 100.0000 mg | ORAL_CAPSULE | Freq: Two times a day (BID) | ORAL | Status: DC

## 2012-04-30 MED ORDER — DEXTROSE-NACL 5-0.9 % IV SOLN
INTRAVENOUS | Status: DC
  Administered 2012-04-30: 75 mL/h via INTRAVENOUS

## 2012-04-30 NOTE — Progress Notes (Signed)
Patient ID: [name], male   DOB: 02/15/1929, 78 y.o.   MRN: [id number]  1 Day Post-Op Subjective: Pt doing well.  No complaints.  Ambulated well with walker last night.  No nausea or vomiting.  No flatus. No CP or SOB.  Objective: Vital signs in last 24 hours: Temp:  [96.6 F (35.9 C)-97.5 F (36.4 C)] 96.6 F (35.9 C) (06/21 0513) Pulse Rate:  [47-74] 74  (06/21 0513) Resp:  [8-17] 17  (06/21 0513) BP: (99-164)/(37-71) 153/71 mmHg (06/21 0513) SpO2:  [93 %-100 %] 94 % (06/21 0513) Weight:  [116.212 kg (256 lb 3.2 oz)] 116.212 kg (256 lb 3.2 oz) (06/20 1645)  Intake/Output from previous day: 06/20 0701 - 06/21 0700 In: 4265 [I.V.:4265] Out: 1750 [Urine:1350; Blood:400] Intake/Output this shift:    Physical Exam:  General: Alert and oriented CV: RRR Lungs: Clear Abdomen: Soft, ND, No BS Incisions: C/D/I Ext: NT, No erythema  Lab Results:  Basename 04/30/12 0405 04/29/12 1610  HGB 13.9 13.5  HCT 43.0 42.2   BMET  Basename 04/30/12 0405 04/29/12 1610  NA 131* 134*  K 4.9 4.4  CL 99 104  CO2 21 21  GLUCOSE 250* 162*  BUN 32* 32*  CREATININE 1.79* 1.54*  CALCIUM 8.4 8.3*     Studies/Results: No results found.  Assessment/Plan: -- Decrease IVF and change to D5 NS -- Monitor renal function -- Dulcolax suppository -- Advance diet -- Ambulate, PT eval -- IS use -- D/C catheter -- Disp: Depends on ambulatory ability and PT eval to determine need for HHPT or if rehab would be necessary   LOS: 1 day   [name],LES 04/30/2012, 7:18 AM

## 2012-04-30 NOTE — Evaluation (Signed)
Physical Therapy Evaluation Patient Details Name: SHAWNEE HIGHAM MRN: [id number] DOB: 08/09/1929 Today's Date: 04/30/2012 Time: 1010-1040 PT Time Calculation (min): 30 min  PT Assessment / Plan / Recommendation Clinical Impression  Pt admitted for radical nephrectomy due to L renal neoplasm. Pt presents with decreased functional mobility, generalized weakness, and decreased activity tolernace. Pt will benefit from skilled PT to address below impairments and reduce caregiver burden and falls risk at next venue of care. Pt also with bil TKA and h/o DJD spine. Limiting posture and increasing weakness, pt PT. Discussed DC planning with pt, including options for SNF vs HHPT if daughter available for 24hr assist. Pt wants to discuss with daughter.     PT Assessment  Patient needs continued PT services    Follow Up Recommendations  Home health PT;Skilled nursing facility;Supervision/Assistance - 24 hour (TBD)    Barriers to Discharge Other (comment) Caregiver support TBD    lEquipment Recommendations  Defer to next venue    Recommendations for Other Services     Frequency Min 3X/week    Precautions / Restrictions Precautions Precautions: Fall Restrictions Weight Bearing Restrictions: No   Pertinent Vitals/Pain No complaints       Mobility  Bed Mobility Bed Mobility: Rolling Right;Right Sidelying to Sit;Sitting - Scoot to [place] of Bed Rolling Right: 4: Min assist;With rail Right Sidelying to Sit: 3: Mod assist Sitting - Scoot to Edge of Bed: 4: Min assist;With rail Details for Bed Mobility Assistance: Pt wanting to get out on L side although home gets out on R. Cues needed for hand placement, technique, and assist for lifitng trunka nd reaching.  Transfers Transfers: Sit to Stand;Stand to Sit Sit to Stand: 3: Mod assist;With upper extremity assist;From bed Stand to Sit: 4: Min guard;With upper extremity assist;To chair/3-in-1;With armrests Details for Transfer Assistance: Lifting  assist and cues for anterior translation over BOS Ambulation/Gait Ambulation/Gait Assistance: 4: Min assist Ambulation Distance (Feet): 40 Feet Assistive device: Rolling walker Ambulation/Gait Assistance Details: Decreased cadence, cues for posture Gait Pattern: Step-through pattern;Decreased stride length;Trunk flexed Stairs: No Wheelchair Mobility Wheelchair Mobility: No         PT Diagnosis: Difficulty walking;Generalized weakness;Acute pain  PT Problem List: Decreased strength;Decreased range of motion;Decreased activity tolerance;Decreased balance;Decreased mobility;Decreased knowledge of use of DME;Decreased safety awareness;Pain PT Treatment Interventions: DME instruction;Gait training;Stair training;Functional mobility training;Therapeutic activities;Therapeutic exercise;Balance training;Neuromuscular re-education;Patient/family education   PT Goals Acute Rehab PT Goals PT Goal Formulation: With patient Time For Goal Achievement: 05/07/12 Potential to Achieve Goals: Good Pt will Roll Supine to Left Side: with modified independence PT Goal: Rolling Supine to Left Side - Progress: Goal set today Pt will go Supine/Side to Sit: with modified independence PT Goal: Supine/Side to Sit - Progress: Goal set today Pt will go Sit to Supine/Side: with modified independence PT Goal: Sit to Supine/Side - Progress: Goal set today Pt will go Sit to Stand: with supervision;with upper extremity assist PT Goal: Sit to Stand - Progress: Goal set today Pt will go Stand to Sit: with supervision PT Goal: Stand to Sit - Progress: Goal set today Pt will Ambulate: 51 - 150 feet;with supervision;with least restrictive assistive device PT Goal: Ambulate - Progress: Goal set today Pt will Go Up / Down Stairs: 1-2 stairs;with min assist;with least restrictive assistive device PT Goal: Up/Down Stairs - Progress: Goal set today  Visit Information  Last PT Received On: 04/30/12 Assistance Needed: +1     Subjective Data  Subjective: I'm ready to walk Patient Stated Goal:  Increase independence, not need to ask daughter to help with ADLs   Prior Functioning  Home Living Lives With: Alone Available Help at Discharge: Family;Available 24 hours/day Type of Home: House Home Access: Stairs to enter [employer] of Steps: 1 Entrance Stairs-Rails: None Home Layout: One level Home Adaptive Equipment: Walker - four wheeled;Straight cane Additional Comments: Pt states his daughter will come to help him for  a few weeks, then later states he needs to ask her about availability. He says he does not want to ask her for help  Prior Function Level of Independence: Independent with assistive device(s) Able to Take Stairs?: Yes Driving: No Communication Communication: HOH    Cognition  Overall Cognitive Status: Appears within functional limits for tasks assessed/performed Arousal/Alertness: Awake/alert Orientation Level: Appears intact for tasks assessed Behavior During Session: [facility] for tasks performed    Extremity/Trunk Assessment Right Upper Extremity Assessment RUE ROM/Strength/Tone: [facility] for tasks assessed Left Upper Extremity Assessment LUE ROM/Strength/Tone: [facility] for tasks assessed Right Lower Extremity Assessment RLE ROM/Strength/Tone: [facility] for tasks assessed Left Lower Extremity Assessment LLE ROM/Strength/Tone: WFL for tasks assessed       End of Session PT - End of Session Equipment Utilized During Treatment: Gait belt Activity Tolerance: Patient tolerated treatment well Patient left: in chair;with call bell/phone within reach Nurse Communication: Mobility status   Virl Cagey, PT 04/30/2012, 11:13 AM

## 2012-04-30 NOTE — Progress Notes (Signed)
   CARE MANAGEMENT NOTE 04/30/2012  Patient:  [name], [name]   Account Number:  [id number]  Date Initiated:  04/30/2012  Documentation initiated by:  Jiles Crocker  Subjective/Objective Assessment:   ADMITTED FOR  Left laparoscopic radical nephrectomy     Action/Plan:   PCP: Dr. Lucky Cowboy; LIVES ALONE   Anticipated DC Date:  05/04/2012   Anticipated DC Plan:  HOME/SELF CARE      DC Planning Services  CM consult              Status of service:  In process, will continue to follow Medicare Important Message given?  NA - LOS <3 / Initial given by admissions (If response is "NO", the following Medicare IM given date fields will be blank)  Per UR Regulation:  Reviewed for med. necessity/level of care/duration of stay  Comments:  04/30/2012- B Ian Castagna RN, BSN, MHA

## 2012-04-30 NOTE — Consult Note (Signed)
WOC consult Note Reason for Consult: Consult requested for sacral wound.  Pt has full thickness lesion on right buttock.  He states he has had "for about a month", prior to admission.  Appearance not consistent with a pressure ulcer.  Pt states he had a "pimple which broke open and drained" and admitts that he spends up to 10 hours a day sitting in his recliner.  He also states his nose swab was positive and Bactroban has been ordered.   Measurement: .5X.5X.1cm Wound bed: 100% red and dry Drainage (amount, consistency, odor) Scant yellow drainage, no odor.  Daughter at bedside to discuss plan of care. Periwound: Intact Dressing procedure/placement/frequency: Bactroban Q day to site to provide antimicrobial benefits and promote moist healing. Foam dressing to protect from friction/shear/ Will not plan to follow further unless re-consulted.  [address], RN, MSN, [employer]  [phone]

## 2012-04-30 NOTE — Progress Notes (Signed)
Patient ID: [name], male   DOB: July 10, 1929, 78 y.o.   MRN: [id number]   Pt doing well.  Tolerating regular diet.  PT saw patient today and recommended SNF vs. HHPT (if daughter can provide 24 hr care). [name] is going to discuss with daughter.  I will consult case management/SW to help with arrangements.  He otherwise is doing well and will likely be ready for discharge Saturday or Sunday pending decision on HHPT vs.SNF.  Path pending.

## 2012-05-01 LAB — BASIC METABOLIC PANEL
CO2: 22 mEq/L (ref 19–32)
Chloride: 103 mEq/L (ref 96–112)
GFR calc Af Amer: 31 mL/min — ABNORMAL LOW (ref >90)
Potassium: 3.9 mEq/L (ref 3.5–5.1)

## 2012-05-01 NOTE — Progress Notes (Addendum)
Patient does not have an active IV.  Removed IV today due to phlebitis.  Patient does not want a new IV inserted.  Md aware.

## 2012-05-01 NOTE — Progress Notes (Signed)
2 Days Post-Op Subjective: Patient reports no significant pain. He has passed flatus and had a soft stool. He has no nausea. Pain is minimal.  Objective: Vital signs in last 24 hours: Temp:  [97.5 F (36.4 C)-98.3 F (36.8 C)] 97.5 F (36.4 C) (06/22 0542) Pulse Rate:  [60-73] 60  (06/22 0542) Resp:  [16-17] 17  (06/22 0542) BP: (132-182)/(56-65) 169/63 mmHg (06/22 0542) SpO2:  [93 %-96 %] 93 % (06/22 0542)  Intake/Output from previous day: 06/21 0701 - 06/22 0700 In: 840 [P.O.:840] Out: 1455 [Urine:1455] Intake/Output this shift: Total I/O In: -  Out: 400 [Urine:400]  Physical Exam:  Constitutional: Vital signs reviewed. WD WN in NAD   Abdomen is soft. Incision sites are clean, dry, without redness or discharge.  Lab Results:  Basename 04/30/12 0405 04/29/12 1610  HGB 13.9 13.5  HCT 43.0 42.2   BMET  Basename 05/01/12 0545 04/30/12 0405  NA 133* 131*  K 3.9 4.9  CL 103 99  CO2 22 21  GLUCOSE 145* 250*  BUN 35* 32*  CREATININE 2.18* 1.79*  CALCIUM 8.2* 8.4   No results found for this basename: LABPT:3,INR:3 in the last 72 hours No results found for this basename: LABURIN:1 in the last 72 hours Results for orders placed during the hospital encounter of 04/21/12  MRSA CULTURE     Status: Normal   Collection Time   04/21/12  2:35 PM      Component Value Range Status Comment   Specimen Description NOSE   Final    Special Requests NONE   Final    Culture     Final    Value: STAPHYLOCOCCUS AUREUS     Note: NO MRSA ISOLATED   Report Status 04/24/2012 FINAL   Final     Studies/Results: No results found.  Assessment/Plan:   Postoperative day 2 left laparoscopic-assisted radical nephrectomy. He is doing well. He is not quite ready to go home. It looks like his daughter will be able to stay with him after discharge.   LOS: 2 days   Marcine Matar M 05/01/2012, 9:06 AM

## 2012-05-02 MED ORDER — ONDANSETRON HCL 4 MG PO TABS
4.0000 mg | ORAL_TABLET | Freq: Once | ORAL | Status: AC
  Administered 2012-05-02: 4 mg via ORAL
  Filled 2012-05-02: qty 1

## 2012-05-02 MED ORDER — ONDANSETRON HCL 4 MG PO TABS: 4.0000 mg | ORAL_TABLET | Freq: Three times a day (TID) | ORAL | Status: AC | PRN

## 2012-05-02 NOTE — Discharge Summary (Signed)
Patient ID: [name] MRN: [id number] DOB/AGE: Apr 12, 1929 78 y.o.  Admit date: 04/29/2012 Discharge date: 05/02/2012  Primary Care Physician:  No primary provider on file.  Discharge Diagnoses:   Left renal mass  Consults:  None    Discharge Medications: Medication List  As of 05/02/2012  6:57 AM   TAKE these medications         allopurinol 300 MG tablet   Commonly known as: ZYLOPRIM   Take 300 mg by mouth daily. Takes 1/2 tablet daily      amLODipine 5 MG tablet   Commonly known as: NORVASC   Take 5 mg by mouth daily.      aspirin EC 81 MG tablet   Take 81 mg by mouth daily.      atenolol 50 MG tablet   Commonly known as: TENORMIN   Take 50 mg by mouth every evening.      bumetanide 2 MG tablet   Commonly known as: BUMEX   Take 1 mg by mouth daily. Takes 1/2 tablet      enalapril 20 MG tablet   Commonly known as: VASOTEC   Take 20 mg by mouth daily.      famotidine 40 MG tablet   Commonly known as: PEPCID   Take 40 mg by mouth daily as needed. stomach      HYDROcodone-acetaminophen 5-325 MG per tablet   Commonly known as: NORCO   Take 1-2 tablets by mouth every 6 (six) hours as needed (pain).      levothyroxine 200 MCG tablet   Commonly known as: SYNTHROID, LEVOTHROID   Take 100 mcg by mouth daily. Takes 1/2 tablet      Magnesium 250 MG Tabs   Take 1 tablet by mouth 2 (two) times daily.      meloxicam 15 MG tablet   Commonly known as: MOBIC   Take 15 mg by mouth daily.      multivitamin with minerals Tabs   Take 1 tablet by mouth daily.      nitroGLYCERIN 0.4 MG SL tablet   Commonly known as: NITROSTAT   Place 0.4 mg under the tongue every 5 (five) minutes as needed. Chest pains      ondansetron 4 MG tablet   Commonly known as: ZOFRAN   Take 1 tablet (4 mg total) by mouth every 8 (eight) hours as needed for nausea.      pravastatin 40 MG tablet   Commonly known as: PRAVACHOL   Take 40 mg by mouth daily.      vitamin C 500 MG tablet   Commonly known as: ASCORBIC ACID   Take 500 mg by mouth 2 (two) times daily.             Significant Diagnostic Studies:  No results found.  Brief H and P: For complete details please refer to admission H and P, but in brief [name] is an 78 year old who noted a painless lump in his right flank a couple of weeks ago prompting a CT scan of the abdomen and pelvis with contrast for further evaluation. He denied hematuria or pain. He has not had any recent weight loss. His CT demonstrated a possible right flank lipoma but incidentally detected a large 9.6 x 9.2 x 7.8 cm left renal neoplasm that appeared heterogeneous with central necrosis and consistent with renal cell carcinoma. On delayed images, his mass clearly de-enhanced. The mass encompasses the majority of the lower pole of the kidney  and extends up toward the central hilum. There is no associated regional lymphadenopathy, adrenal masses, and the contralateral kidney appears normal. The left renal vein is well visualized and does not demonstrate evidence of renal vein involvement. There is no other evidence of metastatic disease to the abdomen. He has no family history of kidney cancer.  He does have a history of coronary artery disease and is status post a cardiac stent placement by Dr. Viann Fish over 6 years ago. He does not take Plavix but does take aspirin 81 mg. He is seen today while in a wheelchair although states that he typically will walk with a cane or sometimes with a walker at home. This is related to his arthritis.  His baseline renal function is mildly impaired with a baseline Cr of 1.36. His LFTs and alkaline phosphatase from 03/24/12 were normal. Hgb was 15.3.    Hospital Course:  The patient underwent laparoscopic-assisted left radical nephrectomy by Dr. Laverle Patter. He had a relatively uncomplicated postoperative course. There was an elevating trend to his creatinine, but his diet was advanced and his bowel function  returned normally. He was discharged home on postoperative day #3. His creatinine was 2.18 on 6/23. This will be followed as an outpatient.  Day of Discharge BP 178/71  Pulse 62  Temp 98 F (36.7 C) (Oral)  Resp 20  Ht 5\' 10"  (1.778 m)  Wt 116.212 kg (256 lb 3.2 oz)  BMI 36.76 kg/m2  SpO2 92%  No results found for this or any previous visit (from the past 24 hour(s)).  Physical Exam: General: Alert and awake oriented x3 not in any acute distress. HEENT: anicteric sclera, pupils reactive to light and accommodation CVS: S1-S2 clear no murmur rubs or gallops Chest: clear to auscultation bilaterally, no wheezing rales or rhonchi Abdomen: soft nontender, nondistended, normal bowel sounds, no organomegaly Extremities: no cyanosis, clubbing or edema noted bilaterally Neuro: Cranial nerves II-XII intact, no focal neurological deficits  Disposition: Home  Diet: Regular  Activity: Discussed with patient   Disposition and Follow-up: Discharge Orders    Future Orders Please Complete By Expires   Discharge patient           TESTS THAT NEED FOLLOW-UP Pathology results are pending  DISCHARGE FOLLOW-UP Follow-up Information    Follow up with Crecencio Mc, MD on 05/25/2012. (at 10:15)    Contact information:   [address] [place], [address] [facility] [place] [address] [phone]          Time spent on Discharge: 15 minutes   Signed: Chelsea Aus 05/02/2012, 6:57 AM

## 2012-11-04 ENCOUNTER — Other Ambulatory Visit: Payer: Self-pay | Admitting: Neurosurgery

## 2012-11-04 DIAGNOSIS — R269 Unspecified abnormalities of gait and mobility: Secondary | ICD-10-CM

## 2012-11-25 ENCOUNTER — Ambulatory Visit
Admission: RE | Admit: 2012-11-25 | Discharge: 2012-11-25 | Disposition: A | Discharge status: Home / Self Care | Payer: Medicare Other | Source: Ambulatory Visit | Attending: Neurosurgery | Admitting: Neurosurgery

## 2012-11-25 DIAGNOSIS — R269 Unspecified abnormalities of gait and mobility: Secondary | ICD-10-CM

## 2012-12-01 ENCOUNTER — Ambulatory Visit (HOSPITAL_COMMUNITY)
Admission: RE | Admit: 2012-12-01 | Discharge: 2012-12-01 | Disposition: A | Discharge status: Home / Self Care | Payer: Medicare Other | Source: Ambulatory Visit | Attending: Urology | Admitting: Urology

## 2012-12-01 ENCOUNTER — Other Ambulatory Visit (HOSPITAL_COMMUNITY): Payer: Self-pay | Admitting: Urology

## 2012-12-01 DIAGNOSIS — C649 Malignant neoplasm of unspecified kidney, except renal pelvis: Secondary | ICD-10-CM | POA: Insufficient documentation

## 2012-12-27 ENCOUNTER — Other Ambulatory Visit: Payer: Self-pay | Admitting: Neurosurgery

## 2013-01-06 ENCOUNTER — Encounter (HOSPITAL_COMMUNITY): Payer: Self-pay | Admitting: Pharmacy Technician

## 2013-01-10 ENCOUNTER — Encounter (HOSPITAL_COMMUNITY): Payer: Self-pay

## 2013-01-10 ENCOUNTER — Encounter (HOSPITAL_COMMUNITY)
Admission: RE | Admit: 2013-01-10 | Discharge: 2013-01-10 | Disposition: A | Discharge status: Home / Self Care | Payer: Medicare Other | Source: Ambulatory Visit | Attending: Neurosurgery | Admitting: Neurosurgery

## 2013-01-10 LAB — BASIC METABOLIC PANEL
CO2: 20 mEq/L (ref 19–32)
Chloride: 105 mEq/L (ref 96–112)
Glucose, Bld: 115 mg/dL — ABNORMAL HIGH (ref 70–99)
Potassium: 5 mEq/L (ref 3.5–5.1)
Sodium: 136 mEq/L (ref 135–145)

## 2013-01-10 LAB — CBC
HCT: 31.3 % — ABNORMAL LOW (ref 39.0–52.0)
Hemoglobin: 10.4 g/dL — ABNORMAL LOW (ref 13.0–17.0)
MCH: 32.7 pg (ref 26.0–34.0)
MCV: 98.4 fL (ref 78.0–100.0)
Platelets: 208 10*3/uL (ref 150–400)
RBC: 3.18 MIL/uL — ABNORMAL LOW (ref 4.22–5.81)
WBC: 8.9 10*3/uL (ref 4.0–10.5)

## 2013-01-10 NOTE — Progress Notes (Signed)
Call again to Dr. York Spaniel office for record.  12 lead EKG not with fax.

## 2013-01-10 NOTE — Progress Notes (Signed)
Call to Dr. York Spaniel office for cardiac records.

## 2013-01-10 NOTE — Pre-Procedure Instructions (Signed)
[name]  01/10/2013   Your procedure is scheduled on:  Tuesday, March 11th.  Report to [facility] at 5:30AM.   Call this number if you have problems the morning of surgery: [phone]   Remember:   Do not eat food or drink liquids after midnight.   Take these medicines the morning of surgery with A SIP OF WATER: Allopurinol (Zyloprim), Amlodipine (Norvasc), Levothyroxine (Synthyroid).  May take Famotidine (Pepcid) and Nitroglycerin  if needed.   Do not wear jewelry  Do not wear lotions, powders, or perfumes. You may wear deodorant.             Men may shave face and neck.  Do not bring valuables to the hospital.  Contacts, dentures or bridgework may not be worn into surgery.  Leave suitcase in the car. After surgery it may be brought to your room.  For patients admitted to the hospital, checkout time is 11:00 AM the day of  discharge.   Patients discharged the day of surgery will not be allowed to drive  home.  Name and phone number of your driver: w/ daughter   Special Instructions: Shower using CHG 2 nights before surgery and the night before surgery.  If you shower the day of surgery use CHG.  Use special wash - you have one bottle of CHG for all showers.  You should use approximately 1/3 of the bottle for each shower.   Please read over the following fact sheets that you were given: Pain Booklet, Coughing and Deep Breathing and Surgical Site Infection Prevention

## 2013-01-10 NOTE — Progress Notes (Signed)
01/10/13 1503  OBSTRUCTIVE SLEEP APNEA  Have you ever been diagnosed with sleep apnea through a sleep study? No  Do you snore loudly (loud enough to be heard through closed doors)?  0  Do you often feel tired, fatigued, or sleepy during the daytime? 0  Has anyone observed you stop breathing during your sleep? 0  Do you have, or are you being treated for high blood pressure? 1  BMI more than 35 kg/m2? 1  Age over 77 years old? 1  Neck circumference greater than 40 cm/18 inches? 1  Gender: 1  Obstructive Sleep Apnea Score 5

## 2013-01-11 ENCOUNTER — Other Ambulatory Visit (HOSPITAL_COMMUNITY): Payer: Medicare Other

## 2013-01-11 NOTE — Progress Notes (Addendum)
Anesthesia chart review: Patient is an 79 year old male scheduled for C3-4, C5-6 ACDF by Dr. Gerlene Fee on 01/18/13. History includes former smoker, obesity, hypothyroidism, CAD s/p RCA stent '09, hypertension, anemia, hearing deficit, chronic kidney disease, renal cancer status post left radical nephrectomy 04/2012 (Dr. Laverle Patter), arthritis.  OSA screening score was 5. PCP is Dr. Lucky Cowboy.   Cardiologist is Dr. Donnie Aho, last visit 10/14/12.  He did have a nuclear stress test on 12/16/12 in anticipation for surgery and this showed no evidence of ischemia, EF 64% with normal wall motion and wall thickening.  Notes state, "This is a low risk scan and may proceed with spinal surgery."  EKG on 04/22/12 (Dr. Donnie Aho) showed SB @ 45 bpm, sinus arrhythmia, PACs.    CXR on 12/01/12 showed stable CXR, no evidence of metastatic involvement.  Preoperative labs noted.  His Cr has further increased to 2.41 since 05/01/12.  Pre-nephrectomy Cr ~ 1.5, but was at 2.18 by discharge in June 2013.  I spoke with Katrina at Dr. Kathryne Sharper office. Labs faxed to Dr. Oneta Rack for his review.  She will also fax me the most recent lab values from their office. I reported renal function results and updated Erie Noe at [facility] via voicemail.  I'll review additional records once reviewed.  Shonna Chock, PA-C 01/11/13 1115  Addendum: 01/17/13 1400 I received a fax back from Dr. Oneta Rack after his review of patient's labs suggesting renal consult.  Last week, I notified Vanessa at Dr. Trudee Grip office of Dr. Kathryne Sharper recommendations.  She did sent some communication to Dr. Oneta Rack, but reportedly, unless Dr. Gerlene Fee hears otherwise he wishes to proceed with post-op follow-up.  I have communicated this to Anesthesiologist Dr. Gelene Mink, who suggested I communicate with patient's urologist Dr. Lou Cal patient is yet to be established with a nephrologist.  I did speak with Dr. Laverle Patter about patient's recent lab results and  planned procedure.  He felt patient's Cr had been ~ 2.1 since his nephrectomy and this increase was likely due to loss of nephrons from his nephrectomy along with patient's underlying CKD.  Dr. Laverle Patter would not say either way if he felt patient should see a nephrologist pre-operatively, but did feel that patient's elevated Cr was a "chronic" situation.   I updated Dr. Gelene Mink.  Plan to repeat patient's BMET on arrival to ensure he is no hyperkalemic or has had a significant change in his renal function.  A definitive anesthesia plan will be determined following lab results and/or evaluation of the patient.

## 2013-01-17 MED ORDER — CEFAZOLIN SODIUM-DEXTROSE 2-3 GM-% IV SOLR
2.0000 g | INTRAVENOUS | Status: AC
  Administered 2013-01-18: 2 g via INTRAVENOUS
  Filled 2013-01-17: qty 50

## 2013-01-18 ENCOUNTER — Inpatient Hospital Stay (HOSPITAL_COMMUNITY)
Admission: RE | Admit: 2013-01-18 | Discharge: 2013-01-19 | DRG: 472 | Disposition: A | Discharge status: Home / Self Care | Payer: Medicare Other | Source: Ambulatory Visit | Attending: Neurosurgery | Admitting: Neurosurgery

## 2013-01-18 ENCOUNTER — Inpatient Hospital Stay (HOSPITAL_COMMUNITY): Payer: Medicare Other | Admitting: Anesthesiology

## 2013-01-18 ENCOUNTER — Encounter (HOSPITAL_COMMUNITY)
Admission: RE | Disposition: A | Discharge status: Home / Self Care | Payer: Self-pay | Source: Ambulatory Visit | Attending: Neurosurgery

## 2013-01-18 ENCOUNTER — Encounter (HOSPITAL_COMMUNITY): Payer: Self-pay | Admitting: Vascular Surgery

## 2013-01-18 ENCOUNTER — Inpatient Hospital Stay (HOSPITAL_COMMUNITY): Payer: Medicare Other

## 2013-01-18 ENCOUNTER — Encounter (HOSPITAL_COMMUNITY): Payer: Self-pay | Admitting: *Deleted

## 2013-01-18 DIAGNOSIS — Z87891 Personal history of nicotine dependence: Secondary | ICD-10-CM

## 2013-01-18 DIAGNOSIS — I10 Essential (primary) hypertension: Secondary | ICD-10-CM | POA: Diagnosis present

## 2013-01-18 DIAGNOSIS — Z96659 Presence of unspecified artificial knee joint: Secondary | ICD-10-CM

## 2013-01-18 DIAGNOSIS — Z79899 Other long term (current) drug therapy: Secondary | ICD-10-CM

## 2013-01-18 DIAGNOSIS — M5000 Cervical disc disorder with myelopathy, unspecified cervical region: Principal | ICD-10-CM | POA: Diagnosis present

## 2013-01-18 DIAGNOSIS — M4712 Other spondylosis with myelopathy, cervical region: Secondary | ICD-10-CM | POA: Diagnosis present

## 2013-01-18 DIAGNOSIS — H9190 Unspecified hearing loss, unspecified ear: Secondary | ICD-10-CM | POA: Diagnosis present

## 2013-01-18 DIAGNOSIS — K219 Gastro-esophageal reflux disease without esophagitis: Secondary | ICD-10-CM | POA: Diagnosis present

## 2013-01-18 DIAGNOSIS — Z85528 Personal history of other malignant neoplasm of kidney: Secondary | ICD-10-CM

## 2013-01-18 DIAGNOSIS — Z9861 Coronary angioplasty status: Secondary | ICD-10-CM

## 2013-01-18 DIAGNOSIS — E039 Hypothyroidism, unspecified: Secondary | ICD-10-CM | POA: Diagnosis present

## 2013-01-18 DIAGNOSIS — Z01812 Encounter for preprocedural laboratory examination: Secondary | ICD-10-CM

## 2013-01-18 DIAGNOSIS — I2510 Atherosclerotic heart disease of native coronary artery without angina pectoris: Secondary | ICD-10-CM | POA: Diagnosis present

## 2013-01-18 HISTORY — DX: Unspecified hearing loss, unspecified ear: H91.90

## 2013-01-18 HISTORY — DX: Unspecified osteoarthritis, unspecified site: M19.90

## 2013-01-18 HISTORY — DX: Anemia, unspecified: D64.9

## 2013-01-18 HISTORY — PX: ANTERIOR CERVICAL DECOMP/DISCECTOMY FUSION: SHX1161

## 2013-01-18 HISTORY — DX: Atherosclerotic heart disease of native coronary artery without angina pectoris: I25.10

## 2013-01-18 LAB — BASIC METABOLIC PANEL
CO2: 19 mEq/L (ref 19–32)
Chloride: 105 mEq/L (ref 96–112)
Creatinine, Ser: 2.08 mg/dL — ABNORMAL HIGH (ref 0.50–1.35)
GFR calc Af Amer: 32 mL/min — ABNORMAL LOW (ref >90)
Potassium: 4.8 mEq/L (ref 3.5–5.1)
Sodium: 136 mEq/L (ref 135–145)

## 2013-01-18 SURGERY — ANTERIOR CERVICAL DECOMPRESSION/DISCECTOMY FUSION 2 LEVELS
Anesthesia: General | Site: Spine Cervical | Wound class: Clean

## 2013-01-18 MED ORDER — SIMVASTATIN 5 MG PO TABS
5.0000 mg | ORAL_TABLET | Freq: Every day | ORAL | Status: DC
  Administered 2013-01-18: 5 mg via ORAL
  Filled 2013-01-18 (×2): qty 1

## 2013-01-18 MED ORDER — HEMOSTATIC AGENTS (NO CHARGE) OPTIME
TOPICAL | Status: DC | PRN
  Administered 2013-01-18: 1 via TOPICAL

## 2013-01-18 MED ORDER — CYCLOBENZAPRINE HCL 10 MG PO TABS: 10.0000 mg | ORAL_TABLET | Freq: Three times a day (TID) | ORAL | Status: DC | PRN

## 2013-01-18 MED ORDER — SODIUM CHLORIDE 0.9 % IR SOLN
Status: DC | PRN
  Administered 2013-01-18: 09:00:00

## 2013-01-18 MED ORDER — CEFAZOLIN SODIUM-DEXTROSE 2-3 GM-% IV SOLR
2.0000 g | Freq: Three times a day (TID) | INTRAVENOUS | Status: AC
  Administered 2013-01-18 (×2): 2 g via INTRAVENOUS
  Filled 2013-01-18 (×2): qty 50

## 2013-01-18 MED ORDER — 0.9 % SODIUM CHLORIDE (POUR BTL) OPTIME
TOPICAL | Status: DC | PRN
  Administered 2013-01-18: 1000 mL

## 2013-01-18 MED ORDER — DEXAMETHASONE SODIUM PHOSPHATE 10 MG/ML IJ SOLN
10.0000 mg | INTRAMUSCULAR | Status: AC
  Administered 2013-01-18: 10 mg via INTRAVENOUS
  Filled 2013-01-18: qty 1

## 2013-01-18 MED ORDER — ACETAMINOPHEN 325 MG PO TABS: 650.0000 mg | ORAL_TABLET | ORAL | Status: DC | PRN

## 2013-01-18 MED ORDER — NEOSTIGMINE METHYLSULFATE 1 MG/ML IJ SOLN
INTRAMUSCULAR | Status: DC | PRN
  Administered 2013-01-18: 4 mg via INTRAVENOUS

## 2013-01-18 MED ORDER — PHENOL 1.4 % MT LIQD
1.0000 | OROMUCOSAL | Status: DC | PRN
  Filled 2013-01-18: qty 177

## 2013-01-18 MED ORDER — DEXAMETHASONE 4 MG PO TABS
4.0000 mg | ORAL_TABLET | Freq: Four times a day (QID) | ORAL | Status: AC
  Administered 2013-01-18: 4 mg via ORAL
  Filled 2013-01-18: qty 1

## 2013-01-18 MED ORDER — BACITRACIN 50000 UNITS IM SOLR
INTRAMUSCULAR | Status: AC
  Filled 2013-01-18: qty 1

## 2013-01-18 MED ORDER — FENTANYL CITRATE 0.05 MG/ML IJ SOLN
INTRAMUSCULAR | Status: DC | PRN
  Administered 2013-01-18: 100 ug via INTRAVENOUS
  Administered 2013-01-18 (×3): 50 ug via INTRAVENOUS

## 2013-01-18 MED ORDER — ALLOPURINOL 150 MG HALF TABLET
150.0000 mg | ORAL_TABLET | Freq: Every day | ORAL | Status: DC
  Filled 2013-01-18: qty 1

## 2013-01-18 MED ORDER — ENALAPRIL MALEATE 20 MG PO TABS
20.0000 mg | ORAL_TABLET | Freq: Every day | ORAL | Status: DC
  Filled 2013-01-18: qty 1

## 2013-01-18 MED ORDER — LEVOTHYROXINE SODIUM 100 MCG PO TABS
100.0000 ug | ORAL_TABLET | Freq: Every day | ORAL | Status: DC
  Administered 2013-01-18: 100 ug via ORAL
  Filled 2013-01-18 (×2): qty 1

## 2013-01-18 MED ORDER — SODIUM CHLORIDE 0.9 % IV SOLN: 250.0000 mL | INTRAVENOUS | Status: DC

## 2013-01-18 MED ORDER — EPHEDRINE SULFATE 50 MG/ML IJ SOLN
INTRAMUSCULAR | Status: DC | PRN
  Administered 2013-01-18 (×3): 5 mg via INTRAVENOUS

## 2013-01-18 MED ORDER — GLYCOPYRROLATE 0.2 MG/ML IJ SOLN
INTRAMUSCULAR | Status: DC | PRN
  Administered 2013-01-18: 0.6 mg via INTRAVENOUS

## 2013-01-18 MED ORDER — SODIUM CHLORIDE 0.9 % IJ SOLN: 3.0000 mL | Freq: Two times a day (BID) | INTRAMUSCULAR | Status: DC

## 2013-01-18 MED ORDER — LACTATED RINGERS IV SOLN
INTRAVENOUS | Status: DC | PRN
  Administered 2013-01-18 (×2): via INTRAVENOUS

## 2013-01-18 MED ORDER — SODIUM CHLORIDE 0.9 % IJ SOLN: 3.0000 mL | INTRAMUSCULAR | Status: DC | PRN

## 2013-01-18 MED ORDER — ONDANSETRON HCL 4 MG/2ML IJ SOLN: 4.0000 mg | INTRAMUSCULAR | Status: DC | PRN

## 2013-01-18 MED ORDER — GELATIN ABSORBABLE MT POWD
OROMUCOSAL | Status: DC | PRN
  Administered 2013-01-18: 09:00:00 via TOPICAL

## 2013-01-18 MED ORDER — DEXAMETHASONE SODIUM PHOSPHATE 4 MG/ML IJ SOLN: 4.0000 mg | Freq: Four times a day (QID) | INTRAMUSCULAR | Status: AC

## 2013-01-18 MED ORDER — PROPOFOL 10 MG/ML IV BOLUS
INTRAVENOUS | Status: DC | PRN
  Administered 2013-01-18: 150 mg via INTRAVENOUS

## 2013-01-18 MED ORDER — ROCURONIUM BROMIDE 100 MG/10ML IV SOLN
INTRAVENOUS | Status: DC | PRN
  Administered 2013-01-18: 20 mg via INTRAVENOUS
  Administered 2013-01-18: 10 mg via INTRAVENOUS
  Administered 2013-01-18: 20 mg via INTRAVENOUS

## 2013-01-18 MED ORDER — KCL IN DEXTROSE-NACL 20-5-0.45 MEQ/L-%-% IV SOLN
80.0000 mL/h | INTRAVENOUS | Status: DC
  Filled 2013-01-18 (×3): qty 1000

## 2013-01-18 MED ORDER — ONDANSETRON HCL 4 MG/2ML IJ SOLN
INTRAMUSCULAR | Status: DC | PRN
  Administered 2013-01-18: 4 mg via INTRAVENOUS

## 2013-01-18 MED ORDER — ACETAMINOPHEN 650 MG RE SUPP: 650.0000 mg | RECTAL | Status: DC | PRN

## 2013-01-18 MED ORDER — AMLODIPINE BESYLATE 5 MG PO TABS
5.0000 mg | ORAL_TABLET | Freq: Every day | ORAL | Status: DC
  Filled 2013-01-18 (×2): qty 1

## 2013-01-18 MED ORDER — SODIUM CHLORIDE 0.9 % IV SOLN
INTRAVENOUS | Status: AC
  Filled 2013-01-18: qty 500

## 2013-01-18 MED ORDER — MENTHOL 3 MG MT LOZG: 1.0000 | LOZENGE | OROMUCOSAL | Status: DC | PRN

## 2013-01-18 MED ORDER — LIDOCAINE HCL (CARDIAC) 20 MG/ML IV SOLN
INTRAVENOUS | Status: DC | PRN
  Administered 2013-01-18: 50 mg via INTRAVENOUS

## 2013-01-18 MED ORDER — PHENYLEPHRINE HCL 10 MG/ML IJ SOLN
INTRAMUSCULAR | Status: DC | PRN
  Administered 2013-01-18: 80 ug via INTRAVENOUS
  Administered 2013-01-18 (×2): 40 ug via INTRAVENOUS

## 2013-01-18 MED ORDER — ONDANSETRON HCL 4 MG/2ML IJ SOLN: 4.0000 mg | Freq: Once | INTRAMUSCULAR | Status: DC | PRN

## 2013-01-18 MED ORDER — NITROGLYCERIN 0.4 MG SL SUBL: 0.4000 mg | SUBLINGUAL_TABLET | SUBLINGUAL | Status: DC | PRN

## 2013-01-18 MED ORDER — ARTIFICIAL TEARS OP OINT
TOPICAL_OINTMENT | OPHTHALMIC | Status: DC | PRN
  Administered 2013-01-18: 1 via OPHTHALMIC

## 2013-01-18 MED ORDER — HYDROMORPHONE HCL PF 1 MG/ML IJ SOLN: 0.2500 mg | INTRAMUSCULAR | Status: DC | PRN

## 2013-01-18 MED ORDER — HYDROCODONE-ACETAMINOPHEN 5-325 MG PO TABS: 1.0000 | ORAL_TABLET | ORAL | Status: DC | PRN

## 2013-01-18 MED ORDER — HYDROMORPHONE HCL PF 1 MG/ML IJ SOLN: 1.0000 mg | INTRAMUSCULAR | Status: DC | PRN

## 2013-01-18 MED ORDER — THROMBIN 5000 UNITS EX SOLR
CUTANEOUS | Status: DC | PRN
  Administered 2013-01-18 (×2): 5000 [IU] via TOPICAL

## 2013-01-18 SURGICAL SUPPLY — 54 items
APL SKNCLS STERI-STRIP NONHPOA (GAUZE/BANDAGES/DRESSINGS) ×1
BAG DECANTER FOR FLEXI CONT (MISCELLANEOUS) ×2 IMPLANT
BENZOIN TINCTURE PRP APPL 2/3 (GAUZE/BANDAGES/DRESSINGS) ×2 IMPLANT
BIT DRILL TRINICA 2.3MM (BIT) IMPLANT
BRUSH SCRUB EZ PLAIN DRY (MISCELLANEOUS) ×2 IMPLANT
BUR MATCHSTICK NEURO 3.0 LAGG (BURR) ×2 IMPLANT
CANISTER SUCTION 2500CC (MISCELLANEOUS) ×2 IMPLANT
CLOTH BEACON ORANGE TIMEOUT ST (SAFETY) ×2 IMPLANT
CONT SPEC 4OZ CLIKSEAL STRL BL (MISCELLANEOUS) ×2 IMPLANT
DRAPE C-ARM 42X72 X-RAY (DRAPES) ×4 IMPLANT
DRAPE LAPAROTOMY 100X72 PEDS (DRAPES) ×2 IMPLANT
DRAPE MICROSCOPE ZEISS OPMI (DRAPES) ×2 IMPLANT
DRAPE SURG 17X23 STRL (DRAPES) ×4 IMPLANT
DRESSING TELFA 8X3 (GAUZE/BANDAGES/DRESSINGS) ×2 IMPLANT
DRILL BIT TRINICA 2.3MM (BIT) ×2
ELECT COATED BLADE 2.86 ST (ELECTRODE) ×2 IMPLANT
ELECT REM PT RETURN 9FT ADLT (ELECTROSURGICAL) ×2
ELECTRODE REM PT RTRN 9FT ADLT (ELECTROSURGICAL) ×1 IMPLANT
EVACUATOR SILICONE 100CC (DRAIN) ×1 IMPLANT
GAUZE SPONGE 4X4 16PLY XRAY LF (GAUZE/BANDAGES/DRESSINGS) IMPLANT
GLOVE ECLIPSE 7.5 STRL STRAW (GLOVE) ×6 IMPLANT
GLOVE EXAM NITRILE LRG STRL (GLOVE) IMPLANT
GLOVE EXAM NITRILE MD LF STRL (GLOVE) ×2 IMPLANT
GLOVE EXAM NITRILE XL STR (GLOVE) IMPLANT
GLOVE EXAM NITRILE XS STR PU (GLOVE) IMPLANT
GOWN BRE IMP SLV AUR LG STRL (GOWN DISPOSABLE) IMPLANT
GOWN BRE IMP SLV AUR XL STRL (GOWN DISPOSABLE) IMPLANT
GOWN STRL REIN 2XL LVL4 (GOWN DISPOSABLE) ×2 IMPLANT
HEAD HALTER (SOFTGOODS) ×2 IMPLANT
KIT BASIN OR (CUSTOM PROCEDURE TRAY) ×2 IMPLANT
KIT ROOM TURNOVER OR (KITS) ×2 IMPLANT
NEEDLE SPNL 20GX3.5 QUINCKE YW (NEEDLE) ×2 IMPLANT
NS IRRIG 1000ML POUR BTL (IV SOLUTION) ×2 IMPLANT
PACK LAMINECTOMY NEURO (CUSTOM PROCEDURE TRAY) ×2 IMPLANT
PAD ARMBOARD 7.5X6 YLW CONV (MISCELLANEOUS) ×2 IMPLANT
PATTIES SURGICAL .75X.75 (GAUZE/BANDAGES/DRESSINGS) ×2 IMPLANT
PLATE INVIZIA 1 LEV 22MM (Plate) ×2 IMPLANT
PLATE INVIZIA 1 LEV 24MM (Plate) ×1 IMPLANT
PUTTY BONE GRAFT KIT 2.5ML (Bone Implant) ×2 IMPLANT
RUBBERBAND STERILE (MISCELLANEOUS) ×4 IMPLANT
SCREW SD FIXED 12MM (Screw) ×8 IMPLANT
SPACER TMS 11X14X6 (Spacer) ×2 IMPLANT
SPONGE GAUZE 4X4 12PLY (GAUZE/BANDAGES/DRESSINGS) ×2 IMPLANT
SPONGE INTESTINAL PEANUT (DISPOSABLE) ×2 IMPLANT
SPONGE SURGIFOAM ABS GEL SZ50 (HEMOSTASIS) ×2 IMPLANT
STRIP CLOSURE SKIN 1/2X4 (GAUZE/BANDAGES/DRESSINGS) ×2 IMPLANT
SUT PDS AB 5-0 P3 18 (SUTURE) ×2 IMPLANT
SUT VIC AB 3-0 CP2 18 (SUTURE) ×3 IMPLANT
SYR 20ML ECCENTRIC (SYRINGE) IMPLANT
TAPE STRIPS DRAPE STRL (GAUZE/BANDAGES/DRESSINGS) ×1 IMPLANT
TOWEL OR 17X24 6PK STRL BLUE (TOWEL DISPOSABLE) ×2 IMPLANT
TOWEL OR 17X26 10 PK STRL BLUE (TOWEL DISPOSABLE) ×2 IMPLANT
TRAP SPECIMEN MUCOUS 40CC (MISCELLANEOUS) IMPLANT
WATER STERILE IRR 1000ML POUR (IV SOLUTION) ×2 IMPLANT

## 2013-01-18 NOTE — H&P (Signed)
[name] is an 79 y.o. male.   Chief Complaint: Difficulty with gait and balance HPI: The patient is an 79 year old gentleman who has had progressive difficulty with balance and gait along with some numbness of his right hand. Because of his trouble with walking his primary care physician ordered an MRI scan of the lumbar spine which showed some spinal stenosis. He was referred to [place] at that time but on evaluation after discussing the options it was felt that a picture was more consistent with myelopathy and he therefore underwent imaging studies of his cervical spine. The showed stenosis at C3-4 and C5-6. After discussing the options the patient requested surgery and now comes for a two-level anterior cervical discectomy with fusion and plating. I've had a long discussion with him regarding the risks and benefits of surgical intervention. The risks discussed include but are not limited to bleeding infection weakness numbness paralysis trouble with instrumentation nonunion coma hoarseness and death. We have discussed alternative methods of therapy offered risks and benefits of nonintervention. He's had the opportunity to ask numerous questions and appears to understand. With this information in hand he has requested that we proceed with surgery.  Past Medical History  Diagnosis Date  . Hypothyroidism   . GERD (gastroesophageal reflux disease)   . Cancer     renal neoplasm  . Hard of hearing   . Coronary artery disease   . Hypertension     sees Dr. Donnie Aho, stress test recently for prep for surgery  . Chronic kidney disease     renalCa- nephrectomy [facility]) 04/2012  . Arthritis     stenosis - back, neck  . Anemia   . Hearing deficit     hearing aids- bilateral    Past Surgical History  Procedure Laterality Date  . Joint replacement      bilateral knee replacements   . Cholecystectomy    . Tonsillectomy    . Appendectomy    . Eye surgery      right eye cataract surgery   .  Laparoscopic nephrectomy  04/29/2012    Procedure: LAPAROSCOPIC NEPHRECTOMY;  Surgeon: Crecencio Mc, MD;  Location: [facility];  Service: Urology;  Laterality: Left;      . Coronary angioplasty      stents , + 7-24yrs. ago  . Back surgery      lower back surgery - 1990's    History reviewed. No pertinent family history. Social History:  reports that he quit smoking about 42 years ago. His smoking use included Cigarettes. He smoked 0.00 packs per day. He has never used smokeless tobacco. He reports that he drinks about 6.6 ounces of alcohol per week. He reports that he does not use illicit drugs.  Allergies: No Known Allergies  Medications Prior to Admission  Medication Sig Dispense Refill  . acetaminophen (TYLENOL) 650 MG CR tablet Take 650 mg by mouth every 8 (eight) hours as needed for pain.      Marland Kitchen allopurinol (ZYLOPRIM) 300 MG tablet Take 150 mg by mouth daily. Takes 1/2 tablet daily      . amLODipine (NORVASC) 5 MG tablet Take 5 mg by mouth daily before breakfast.       . aspirin EC 81 MG tablet Take 81 mg by mouth daily.      . bumetanide (BUMEX) 2 MG tablet Take 1 mg by mouth daily before supper. Takes 1/2 tablet      . Cholecalciferol (VITAMIN D3) 1000 UNITS CAPS Take 1,000 capsules  by mouth 2 (two) times daily.      . enalapril (VASOTEC) 20 MG tablet Take 20 mg by mouth daily before lunch.       . Ferrous Sulfate (IRON) 28 MG TABS Take 28 mg by mouth daily.      Marland Kitchen levothyroxine (SYNTHROID, LEVOTHROID) 200 MCG tablet Take 100 mcg by mouth daily before supper. Takes 1/2 tablet      . Magnesium 250 MG TABS Take 1 tablet by mouth 2 (two) times daily.      . Multiple Vitamin (MULITIVITAMIN WITH MINERALS) TABS Take 1 tablet by mouth daily.      . nitroGLYCERIN (NITROSTAT) 0.4 MG SL tablet Place 0.4 mg under the tongue every 5 (five) minutes as needed. Chest pains      . pravastatin (PRAVACHOL) 40 MG tablet Take 40 mg by mouth at bedtime.       . vitamin C (ASCORBIC ACID) 500 MG tablet Take  500 mg by mouth 2 (two) times daily.      . famotidine (PEPCID) 40 MG tablet Take 40 mg by mouth daily as needed. stomach        Results for orders placed during the hospital encounter of 01/18/13 (from the past 48 hour(s))  BASIC METABOLIC PANEL     Status: Abnormal   Collection Time    01/18/13  6:32 AM      Result Value Range   Sodium 136  135 - 145 mEq/L   Potassium 4.8  3.5 - 5.1 mEq/L   Chloride 105  96 - 112 mEq/L   CO2 19  19 - 32 mEq/L   Glucose, Bld 114 (*) 70 - 99 mg/dL   BUN 45 (*) 6 - 23 mg/dL   Creatinine, Ser 4.54 (*) 0.50 - 1.35 mg/dL   Calcium 9.2  8.4 - 09.8 mg/dL   GFR calc non Af Amer 28 (*) >90 mL/min   GFR calc Af Amer 32 (*) >90 mL/min   Comment:            The eGFR has been calculated     using the CKD EPI equation.     This calculation has not been     validated in all clinical     situations.     eGFR's persistently     <90 mL/min signify     possible Chronic Kidney Disease.   No results found.  Review of systems not obtained due to patient factors.  Blood pressure 146/69, pulse 76, temperature 98.1 F (36.7 C), temperature source Oral, resp. rate 18, SpO2 99.00%.  The patient is awake or and oriented. His no facial asymmetry. His gait is somewhat spastic and myelopathic. His reflexes however are not increased and there are some Hoffmann's reflexes in his upper extremities. His strength and muscle testing is 5 minus over 5 and sensation is decreased on the thumb index and middle fingers of his right hand. Assessment/Plan Impression is that of spinal stenosis and cervical myelopathy at C3-4 and C5-6. The plan is for a C3-4 and C5-6 anterior cervical discectomy with fusion and plating.  Reinaldo Meeker, MD 01/18/2013, 7:45 AM

## 2013-01-18 NOTE — Anesthesia Procedure Notes (Signed)
Procedure Name: Intubation Date/Time: 01/18/2013 7:55 AM Performed by: Luster Landsberg Pre-anesthesia Checklist: Patient identified, Emergency Drugs available, Suction available and Patient being monitored Patient Re-evaluated:Patient Re-evaluated prior to inductionOxygen Delivery Method: Circle system utilized Preoxygenation: Pre-oxygenation with 100% oxygen Intubation Type: IV induction Ventilation: Mask ventilation without difficulty and Oral airway inserted - appropriate to patient size Laryngoscope Size: Mac and 3 Grade View: Grade I Tube type: Oral Laser Tube: Laser Tube Tube size: 8.0 mm Number of attempts: 1 Airway Equipment and Method: Stylet and LTA kit utilized Placement Confirmation: ETT inserted through vocal cords under direct vision,  positive ETCO2 and breath sounds checked- equal and bilateral Secured at: 23 cm Tube secured with: Tape Dental Injury: Teeth and Oropharynx as per pre-operative assessment  Comments: Head/neck in neutral position throughout DVL

## 2013-01-18 NOTE — Transfer of Care (Signed)
Immediate Anesthesia Transfer of Care Note  Patient: [name]  Procedure(s) Performed: Procedure(s): ANTERIOR CERVICAL DECOMPRESSION/DISCECTOMY FUSION 2 LEVELS (N/A)  Patient Location: PACU  Anesthesia Type:General  Level of Consciousness: awake, alert  and oriented  Airway & Oxygen Therapy: Patient Spontanous Breathing and Patient connected to nasal cannula oxygen  Post-op Assessment: Report given to PACU RN and Post -op Vital signs reviewed and stable  Post vital signs: Reviewed and stable  Complications: No apparent anesthesia complications

## 2013-01-18 NOTE — Anesthesia Preprocedure Evaluation (Addendum)
Anesthesia Evaluation  Patient identified by MRN, date of birth, ID band Patient awake    Reviewed: Allergy & Precautions, H&P , NPO status , Patient's Chart, lab work & pertinent test results  Airway Mallampati: I TM Distance: >3 FB Neck ROM: full    Dental  (+) Edentulous Upper, Edentulous Lower and Dental Advisory Given Discussed with pt risk of DVL to gums/oral mucosa:   Pulmonary          Cardiovascular hypertension, Pt. on medications + CAD Rhythm:regular Rate:Normal     Neuro/Psych    GI/Hepatic GERD-  ,  Endo/Other  Hypothyroidism   Renal/GU Renal disease     Musculoskeletal   Abdominal   Peds  Hematology   Anesthesia Other Findings   Reproductive/Obstetrics                         Anesthesia Physical Anesthesia Plan  ASA: II  Anesthesia Plan: General   Post-op Pain Management:    Induction: Intravenous  Airway Management Planned: Oral ETT  Additional Equipment:   Intra-op Plan:   Post-operative Plan: Extubation in OR  Informed Consent: I have reviewed the patients History and Physical, chart, labs and discussed the procedure including the risks, benefits and alternatives for the proposed anesthesia with the patient or authorized representative who has indicated his/her understanding and acceptance.     Plan Discussed with: CRNA, Anesthesiologist and Surgeon  Anesthesia Plan Comments:         Anesthesia Quick Evaluation

## 2013-01-18 NOTE — Progress Notes (Signed)
UR COMPLETED  

## 2013-01-18 NOTE — Anesthesia Postprocedure Evaluation (Signed)
  Anesthesia Post-op Note  Patient: [name]  Procedure(s) Performed: Procedure(s): ANTERIOR CERVICAL DECOMPRESSION/DISCECTOMY FUSION 2 LEVELS (N/A)  Patient Location: PACU  Anesthesia Type:General  Level of Consciousness: awake, alert , oriented and patient cooperative  Airway and Oxygen Therapy: Patient Spontanous Breathing  Post-op Pain: mild  Post-op Assessment: Post-op Vital signs reviewed, Patient's Cardiovascular Status Stable, Respiratory Function Stable, Patent Airway, No signs of Nausea or vomiting and Pain level controlled  Post-op Vital Signs: stable  Complications: No apparent anesthesia complications

## 2013-01-18 NOTE — Op Note (Signed)
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 Localizing fluoroscopy was used prior to incision to identify the appropriate level. Transverse incision was made in the right anterior neck started the midline and headed towards the medial aspect of the sternal cremaster muscle. The platysma muscle was then incised transversely and the natural fascial plane between the strap muscles medially and the sternocleidomastoid muscle laterally was identified and followed down to the anterior aspect the cervical spine. Longus coli muscles were identified split in the midline and Triple-A bilaterally with unipolar coagulation and Kitner dissection. Subcutaneous tract was placed for exposure and x-ray showed approach to the C3-4 level. This was incised and approximately 90% of the disc material cleaned out. High-speed drill was used to widen the interspace and bony shavings were saved for use later in the case. Using microdissection technique the remainder of the disc material down the posterior longitudinal ligament was removed. Ligament was incised transversely and the cut edges removed a Kerrison punch. Thorough decompression was then carried out on the spinal dura to decompress the spinal cord and proximal foramen bilaterally. At this time inspection was carried out in all directions any evidence of residual compression and none could be identified. Irrigation was carried out and any bleeding control proper coagulation and Gelfoam. Measurements were taken and a 6 mm lordotic graft was chosen. It was filled with a mixture of autologous  bone morselized allograft. Was impacted at difficulty and fossae showed to be in good position. An appropriately length anterior cervical plate was then chosen. Drill holes were placed followed by placing of 12 mm screws x4. Locking mechanism was rotated locked position and fossae showed to be in good position. We then dissected inferiorly down to C5-6 with a similar procedure. We incised the disc with a 15 blade and thoroughly cleaned out with pituitary rongeurs and curettes. Once again used high-speed drill to widen the interspace and saved the bony shavings. Once again brought the microscope into the field. We removed the remainder of the disc material down the posterior longitudinal ligament and decompress the spinal dura once again as well as the proximal foramen. At this time inspection was carried out once more in all directions for any evidence of residual compression and none could be identified. Irrigation was carried out any bleeding control proper coagulation Gelfoam. Measurements were taken the summer and another 6 mm.graft was chosen. This once again filled morselized allograft and autologous bone and packed a difficulty and fossae showed to be in good position. Another anterior cervical plate was then chosen and drill holes were placed followed by placing 12 mm screws x4. Locking mechanism was rotated locked position and final fossae showed a good position of the plates screws and plugs at both levels. Irrigation was carried out and any bleeding to proper coagulation Gelfoam. Prevertebral drain in the prevertebral space and brought out through separate stab incision. The was then closed with inverted Vicryl on the platysma subcuticular layer and Steri-Strips were placed on the skin. Shortness was then applied the patient was extubated and taken to recovery in stable condition.

## 2013-01-19 MED ORDER — HYDROCODONE-ACETAMINOPHEN 5-325 MG PO TABS: 1.0000 | ORAL_TABLET | ORAL | Status: DC | PRN

## 2013-01-19 NOTE — Discharge Summary (Signed)
Physician Discharge Summary  Patient ID: [name] MRN: [id number] DOB/AGE: 1929-07-04 79 y.o.  Admit date: 01/18/2013 Discharge date: 01/19/2013  Admission Diagnoses:  Discharge Diagnoses:  Active Problems:   * No active hospital problems. *   Discharged Condition: good  Hospital Course: Surgery one day for 2 level acdf with plating. Did well. Increased activity without difficulty. No new neuro issues. Wound looks good. D/C home POD 1.  Consults: None  Significant Diagnostic Studies: none  Treatments: surgery: C 34 C 56 acdf with plating  Discharge Exam: Blood pressure 143/64, pulse 92, temperature 98.6 F (37 C), temperature source Oral, resp. rate 18, SpO2 93.00%. Incision/Wound:clean and dry  Disposition: 01-Home or Self Care  Discharge Orders   Future Orders Complete By Expires     Call MD for:  difficulty breathing, headache or visual disturbances  As directed     Call MD for:  hives  As directed     Call MD for:  persistant nausea and vomiting  As directed     Call MD for:  redness, tenderness, or signs of infection (pain, swelling, redness, odor or green/yellow discharge around incision site)  As directed     Call MD for:  severe uncontrolled pain  As directed     Call MD for:  temperature >100.4  As directed     Diet general  As directed     Discharge instructions  As directed     Comments:      Mostly bedrest. Get up 9 or 10 times each day and walk for 15-20 minutes each time. Very little sitting the first week. No riding in the car until your first post op appointment. If you had neck surgery...may shower from the chest down. If you had low back surgery....you may shower with a saran wrap covering over the incision. Take your pain medicine as needed...and other medicines that you are instructed to take. Call for an appointment...[phone].        Medication List    STOP taking these medications       aspirin EC 81 MG tablet     Iron 28 MG Tabs      TAKE these medications       acetaminophen 650 MG CR tablet  Commonly known as:  TYLENOL  Take 650 mg by mouth every 8 (eight) hours as needed for pain.     allopurinol 300 MG tablet  Commonly known as:  ZYLOPRIM  Take 150 mg by mouth daily. Takes 1/2 tablet daily     amLODipine 5 MG tablet  Commonly known as:  NORVASC  Take 5 mg by mouth daily before breakfast.     bumetanide 2 MG tablet  Commonly known as:  BUMEX  Take 1 mg by mouth daily before supper. Takes 1/2 tablet     enalapril 20 MG tablet  Commonly known as:  VASOTEC  Take 20 mg by mouth daily before lunch.     famotidine 40 MG tablet  Commonly known as:  PEPCID  Take 40 mg by mouth daily as needed. stomach     HYDROcodone-acetaminophen 5-325 MG per tablet  Commonly known as:  NORCO/VICODIN  Take 1-2 tablets by mouth every 4 (four) hours as needed.     levothyroxine 200 MCG tablet  Commonly known as:  SYNTHROID, LEVOTHROID  Take 100 mcg by mouth daily before supper. Takes 1/2 tablet     Magnesium 250 MG Tabs  Take 1 tablet by mouth 2 (two)  times daily.     multivitamin with minerals Tabs  Take 1 tablet by mouth daily.     nitroGLYCERIN 0.4 MG SL tablet  Commonly known as:  NITROSTAT  Place 0.4 mg under the tongue every 5 (five) minutes as needed. Chest pains     pravastatin 40 MG tablet  Commonly known as:  PRAVACHOL  Take 40 mg by mouth at bedtime.     vitamin C 500 MG tablet  Commonly known as:  ASCORBIC ACID  Take 500 mg by mouth 2 (two) times daily.     Vitamin D3 1000 UNITS Caps  Take 1,000 capsules by mouth 2 (two) times daily.         At home rest most of the time. Get up 9 or 10 times each day and take a 15 or 20 minute walk. No riding in the car and to your first postoperative appointment. If you have neck surgery you may shower from the chest down starting on the third postoperative day. If you had back surgery he may start showering on the third postoperative day with saran wrap  wrapped around your incisional area 3 times. After the shower remove the saran wrap. Take pain medicine as needed and other medications as instructed. Call my office for an appointment.  SignedReinaldo Meeker, MD 01/19/2013, 9:51 AM

## 2013-01-19 NOTE — Progress Notes (Signed)
Pt and daughter given D/C instructions with Rx, verbal understanding given. Pt D/C'd home via wheelchair @ 1055 per MD order. Rema Fendt, RN

## 2013-01-24 ENCOUNTER — Encounter (HOSPITAL_COMMUNITY): Payer: Self-pay | Admitting: Neurosurgery

## 2013-02-15 ENCOUNTER — Ambulatory Visit: Payer: Medicare Other | Attending: Neurosurgery | Admitting: Physical Therapy

## 2013-02-15 DIAGNOSIS — IMO0001 Reserved for inherently not codable concepts without codable children: Secondary | ICD-10-CM | POA: Insufficient documentation

## 2013-02-15 DIAGNOSIS — R293 Abnormal posture: Secondary | ICD-10-CM | POA: Insufficient documentation

## 2013-02-15 DIAGNOSIS — R269 Unspecified abnormalities of gait and mobility: Secondary | ICD-10-CM | POA: Insufficient documentation

## 2013-02-15 DIAGNOSIS — R5381 Other malaise: Secondary | ICD-10-CM | POA: Insufficient documentation

## 2013-02-15 DIAGNOSIS — M6281 Muscle weakness (generalized): Secondary | ICD-10-CM | POA: Insufficient documentation

## 2013-02-17 ENCOUNTER — Ambulatory Visit: Payer: Medicare Other | Admitting: Physical Therapy

## 2013-02-28 ENCOUNTER — Encounter: Payer: Self-pay | Admitting: Physical Therapy

## 2013-02-28 ENCOUNTER — Ambulatory Visit: Payer: Medicare Other | Admitting: Physical Therapy

## 2013-03-02 ENCOUNTER — Ambulatory Visit: Payer: Medicare Other | Admitting: Physical Therapy

## 2013-03-03 ENCOUNTER — Ambulatory Visit: Payer: Medicare Other | Admitting: Physical Therapy

## 2013-03-04 ENCOUNTER — Ambulatory Visit: Payer: Medicare Other | Admitting: Physical Therapy

## 2013-03-08 ENCOUNTER — Ambulatory Visit: Payer: Medicare Other | Admitting: Physical Therapy

## 2013-03-09 ENCOUNTER — Ambulatory Visit: Payer: Medicare Other | Admitting: Physical Therapy

## 2013-03-11 ENCOUNTER — Ambulatory Visit: Payer: Medicare Other | Attending: Neurosurgery | Admitting: Physical Therapy

## 2013-03-11 DIAGNOSIS — R269 Unspecified abnormalities of gait and mobility: Secondary | ICD-10-CM | POA: Insufficient documentation

## 2013-03-11 DIAGNOSIS — R5381 Other malaise: Secondary | ICD-10-CM | POA: Insufficient documentation

## 2013-03-11 DIAGNOSIS — R293 Abnormal posture: Secondary | ICD-10-CM | POA: Insufficient documentation

## 2013-03-11 DIAGNOSIS — IMO0001 Reserved for inherently not codable concepts without codable children: Secondary | ICD-10-CM | POA: Insufficient documentation

## 2013-03-11 DIAGNOSIS — M6281 Muscle weakness (generalized): Secondary | ICD-10-CM | POA: Insufficient documentation

## 2013-03-14 ENCOUNTER — Ambulatory Visit: Payer: Medicare Other | Admitting: Physical Therapy

## 2013-03-16 ENCOUNTER — Ambulatory Visit: Payer: Medicare Other | Admitting: Physical Therapy

## 2013-03-18 ENCOUNTER — Ambulatory Visit: Payer: Medicare Other | Admitting: Physical Therapy

## 2013-03-22 ENCOUNTER — Ambulatory Visit: Payer: Medicare Other | Admitting: Physical Therapy

## 2013-03-23 ENCOUNTER — Ambulatory Visit: Payer: Medicare Other | Admitting: Physical Therapy

## 2013-03-25 ENCOUNTER — Ambulatory Visit: Payer: Medicare Other | Admitting: Physical Therapy

## 2013-03-28 ENCOUNTER — Ambulatory Visit: Payer: Medicare Other | Admitting: Physical Therapy

## 2013-03-30 ENCOUNTER — Ambulatory Visit: Payer: Medicare Other | Admitting: Physical Therapy

## 2013-04-01 ENCOUNTER — Ambulatory Visit: Payer: Medicare Other | Admitting: Physical Therapy

## 2013-04-05 ENCOUNTER — Ambulatory Visit: Payer: Medicare Other | Admitting: Physical Therapy

## 2013-04-06 ENCOUNTER — Ambulatory Visit: Payer: Medicare Other | Admitting: Physical Therapy

## 2013-04-08 ENCOUNTER — Ambulatory Visit: Payer: Medicare Other | Admitting: Physical Therapy

## 2013-04-11 ENCOUNTER — Ambulatory Visit: Payer: Medicare Other | Attending: Neurosurgery | Admitting: Physical Therapy

## 2013-04-11 DIAGNOSIS — R269 Unspecified abnormalities of gait and mobility: Secondary | ICD-10-CM | POA: Insufficient documentation

## 2013-04-11 DIAGNOSIS — R5381 Other malaise: Secondary | ICD-10-CM | POA: Insufficient documentation

## 2013-04-11 DIAGNOSIS — M6281 Muscle weakness (generalized): Secondary | ICD-10-CM | POA: Insufficient documentation

## 2013-04-11 DIAGNOSIS — R293 Abnormal posture: Secondary | ICD-10-CM | POA: Insufficient documentation

## 2013-04-11 DIAGNOSIS — IMO0001 Reserved for inherently not codable concepts without codable children: Secondary | ICD-10-CM | POA: Insufficient documentation

## 2013-04-13 ENCOUNTER — Ambulatory Visit: Payer: Medicare Other | Admitting: Physical Therapy

## 2013-04-14 ENCOUNTER — Ambulatory Visit: Payer: Medicare Other | Admitting: Physical Therapy

## 2013-06-07 ENCOUNTER — Other Ambulatory Visit (HOSPITAL_COMMUNITY): Payer: Self-pay | Admitting: Urology

## 2013-06-07 ENCOUNTER — Ambulatory Visit (HOSPITAL_COMMUNITY)
Admission: RE | Admit: 2013-06-07 | Discharge: 2013-06-07 | Disposition: A | Discharge status: Home / Self Care | Payer: Medicare Other | Source: Ambulatory Visit | Attending: Urology | Admitting: Urology

## 2013-06-07 DIAGNOSIS — C649 Malignant neoplasm of unspecified kidney, except renal pelvis: Secondary | ICD-10-CM | POA: Insufficient documentation

## 2013-09-13 ENCOUNTER — Other Ambulatory Visit (HOSPITAL_COMMUNITY): Payer: Self-pay | Admitting: Orthopedic Surgery

## 2013-09-13 DIAGNOSIS — M25561 Pain in right knee: Secondary | ICD-10-CM

## 2013-09-20 ENCOUNTER — Encounter (HOSPITAL_COMMUNITY): Payer: Medicare Other

## 2013-09-20 ENCOUNTER — Ambulatory Visit (HOSPITAL_COMMUNITY): Payer: Medicare Other

## 2013-09-22 ENCOUNTER — Encounter (HOSPITAL_COMMUNITY)
Admission: RE | Admit: 2013-09-22 | Discharge: 2013-09-22 | Disposition: A | Discharge status: Home / Self Care | Payer: PRIVATE HEALTH INSURANCE | Source: Ambulatory Visit | Attending: Orthopedic Surgery | Admitting: Orthopedic Surgery

## 2013-09-22 ENCOUNTER — Ambulatory Visit (HOSPITAL_COMMUNITY)
Admission: RE | Admit: 2013-09-22 | Discharge: 2013-09-22 | Disposition: A | Discharge status: Home / Self Care | Payer: PRIVATE HEALTH INSURANCE | Source: Ambulatory Visit | Attending: Orthopedic Surgery | Admitting: Orthopedic Surgery

## 2013-09-22 DIAGNOSIS — M25569 Pain in unspecified knee: Secondary | ICD-10-CM | POA: Insufficient documentation

## 2013-09-22 DIAGNOSIS — Z96659 Presence of unspecified artificial knee joint: Secondary | ICD-10-CM | POA: Insufficient documentation

## 2013-09-22 DIAGNOSIS — M25561 Pain in right knee: Secondary | ICD-10-CM

## 2013-09-22 MED ORDER — TECHNETIUM TC 99M MEDRONATE IV KIT
25.0000 | PACK | Freq: Once | INTRAVENOUS | Status: AC | PRN
  Administered 2013-09-22: 25 via INTRAVENOUS

## 2013-10-28 ENCOUNTER — Other Ambulatory Visit: Payer: Self-pay | Admitting: Internal Medicine

## 2013-11-23 DIAGNOSIS — E039 Hypothyroidism, unspecified: Secondary | ICD-10-CM | POA: Insufficient documentation

## 2013-11-23 DIAGNOSIS — K219 Gastro-esophageal reflux disease without esophagitis: Secondary | ICD-10-CM | POA: Insufficient documentation

## 2013-11-23 DIAGNOSIS — E559 Vitamin D deficiency, unspecified: Secondary | ICD-10-CM | POA: Insufficient documentation

## 2013-11-23 DIAGNOSIS — R7303 Prediabetes: Secondary | ICD-10-CM | POA: Insufficient documentation

## 2013-11-23 DIAGNOSIS — N184 Chronic kidney disease, stage 4 (severe): Secondary | ICD-10-CM | POA: Insufficient documentation

## 2013-11-23 DIAGNOSIS — C649 Malignant neoplasm of unspecified kidney, except renal pelvis: Secondary | ICD-10-CM | POA: Insufficient documentation

## 2013-11-23 DIAGNOSIS — M159 Polyosteoarthritis, unspecified: Secondary | ICD-10-CM | POA: Insufficient documentation

## 2013-11-24 ENCOUNTER — Encounter: Payer: Self-pay | Admitting: Physician Assistant

## 2013-11-24 ENCOUNTER — Ambulatory Visit (INDEPENDENT_AMBULATORY_CARE_PROVIDER_SITE_OTHER): Payer: Medicare Other | Admitting: Physician Assistant

## 2013-11-24 VITALS — BP 140/60 | HR 80 | Temp 97.9°F | Resp 16 | Ht 68.0 in | Wt 239.0 lb

## 2013-11-24 DIAGNOSIS — E782 Mixed hyperlipidemia: Secondary | ICD-10-CM | POA: Insufficient documentation

## 2013-11-24 DIAGNOSIS — E039 Hypothyroidism, unspecified: Secondary | ICD-10-CM

## 2013-11-24 DIAGNOSIS — E559 Vitamin D deficiency, unspecified: Secondary | ICD-10-CM

## 2013-11-24 DIAGNOSIS — I10 Essential (primary) hypertension: Secondary | ICD-10-CM

## 2013-11-24 DIAGNOSIS — E785 Hyperlipidemia, unspecified: Secondary | ICD-10-CM

## 2013-11-24 DIAGNOSIS — R7303 Prediabetes: Secondary | ICD-10-CM

## 2013-11-24 DIAGNOSIS — Z79899 Other long term (current) drug therapy: Secondary | ICD-10-CM

## 2013-11-24 DIAGNOSIS — R7309 Other abnormal glucose: Secondary | ICD-10-CM

## 2013-11-24 LAB — CBC WITH DIFFERENTIAL/PLATELET
Basophils Absolute: 0.1 10*3/uL (ref 0.0–0.1)
Basophils Relative: 1 % (ref 0–1)
EOS ABS: 0.5 10*3/uL (ref 0.0–0.7)
EOS PCT: 6 % — AB (ref 0–5)
HEMATOCRIT: 33 % — AB (ref 39.0–52.0)
Hemoglobin: 10.9 g/dL — ABNORMAL LOW (ref 13.0–17.0)
LYMPHS ABS: 2.4 10*3/uL (ref 0.7–4.0)
LYMPHS PCT: 29 % (ref 12–46)
MCH: 31.2 pg (ref 26.0–34.0)
MCHC: 33 g/dL (ref 30.0–36.0)
MCV: 94.6 fL (ref 78.0–100.0)
MONO ABS: 0.9 10*3/uL (ref 0.1–1.0)
MONOS PCT: 11 % (ref 3–12)
Neutro Abs: 4.3 10*3/uL (ref 1.7–7.7)
Neutrophils Relative %: 53 % (ref 43–77)
Platelets: 259 10*3/uL (ref 150–400)
RBC: 3.49 MIL/uL — AB (ref 4.22–5.81)
RDW: 14.4 % (ref 11.5–15.5)
WBC: 8.2 10*3/uL (ref 4.0–10.5)

## 2013-11-24 LAB — HEPATIC FUNCTION PANEL
ALBUMIN: 3.7 g/dL (ref 3.5–5.2)
ALT: 13 U/L (ref 0–53)
AST: 15 U/L (ref 0–37)
Alkaline Phosphatase: 91 U/L (ref 39–117)
Bilirubin, Direct: 0.1 mg/dL (ref 0.0–0.3)
Indirect Bilirubin: 0.2 mg/dL (ref 0.0–0.9)
TOTAL PROTEIN: 6.9 g/dL (ref 6.0–8.3)
Total Bilirubin: 0.3 mg/dL (ref 0.3–1.2)

## 2013-11-24 LAB — BASIC METABOLIC PANEL WITH GFR
BUN: 42 mg/dL — AB (ref 6–23)
CHLORIDE: 104 meq/L (ref 96–112)
CO2: 26 meq/L (ref 19–32)
CREATININE: 1.84 mg/dL — AB (ref 0.50–1.35)
Calcium: 8.9 mg/dL (ref 8.4–10.5)
GFR, Est African American: 39 mL/min — ABNORMAL LOW
GFR, Est Non African American: 33 mL/min — ABNORMAL LOW
GLUCOSE: 114 mg/dL — AB (ref 70–99)
Potassium: 4.7 mEq/L (ref 3.5–5.3)
Sodium: 138 mEq/L (ref 135–145)

## 2013-11-24 LAB — LIPID PANEL
CHOL/HDL RATIO: 3.3 ratio
CHOLESTEROL: 133 mg/dL (ref 0–200)
HDL: 40 mg/dL (ref >39)
LDL Cholesterol: 78 mg/dL (ref 0–99)
Triglycerides: 74 mg/dL (ref <150)
VLDL: 15 mg/dL (ref 0–40)

## 2013-11-24 LAB — HEMOGLOBIN A1C
Hgb A1c MFr Bld: 6.2 % — ABNORMAL HIGH (ref <5.7)
Mean Plasma Glucose: 131 mg/dL — ABNORMAL HIGH (ref <117)

## 2013-11-24 LAB — TSH: TSH: 2.779 u[IU]/mL (ref 0.350–4.500)

## 2013-11-24 LAB — MAGNESIUM: MAGNESIUM: 2.3 mg/dL (ref 1.5–2.5)

## 2013-11-24 MED ORDER — BUMETANIDE 2 MG PO TABS: ORAL_TABLET | ORAL | Status: DC

## 2013-11-24 MED ORDER — PRAVASTATIN SODIUM 40 MG PO TABS
40.0000 mg | ORAL_TABLET | Freq: Every day | ORAL | Status: DC
Start: 2013-11-24 — End: 2014-05-30

## 2013-11-24 MED ORDER — LEVOTHYROXINE SODIUM 200 MCG PO TABS: ORAL_TABLET | ORAL | Status: DC

## 2013-11-24 MED ORDER — TRAMADOL HCL 50 MG PO TABS: ORAL_TABLET | ORAL | Status: DC

## 2013-11-24 NOTE — Progress Notes (Signed)
HPI Patient presents for 3 month follow up with hypertension, hyperlipidemia, prediabetes and vitamin D. Patient's blood pressure has been controlled at home, today their BP is BP: 140/60 mmHg  Patient denies chest pain, shortness of breath, dizziness.  Recently saw Dr. Wynonia Lawman, cardio, and he stated to keep a higher BP.  Patient's cholesterol is diet controlled. In addition they are on Pravastatin and denies myalgias. The cholesterol last visit was LDL 82, HDL 34. The patient has been working on diet and exercise for prediabetes, and denies changes in vision, polys. .A1c 5.9. Patient is on Vitamin D supplement.   He has spinal stenosis and knee problems, he is seeing ortho and neuro for this and currently doing PT 3 times a week which is helping his mobility. He takes the tramadol 50 2 times daily and would like to take more/increase dose. + paresthesias Current Medications:    Medication List       This list is accurate as of: 11/24/13 10:50 AM.  Always use your most recent med list.               allopurinol 300 MG tablet  Commonly known as:  ZYLOPRIM  Take 150 mg by mouth daily. Takes 1/2 tablet daily     amLODipine 5 MG tablet  Commonly known as:  NORVASC  Take 5 mg by mouth daily before breakfast.     bumetanide 2 MG tablet  Commonly known as:  BUMEX  Take 1 mg by mouth daily before supper. Takes 1/2 tablet     famotidine 40 MG tablet  Commonly known as:  PEPCID  Take 40 mg by mouth daily as needed. stomach     HYDROcodone-acetaminophen 5-325 MG per tablet  Commonly known as:  NORCO/VICODIN  Take 1-2 tablets by mouth every 4 (four) hours as needed.     levothyroxine 200 MCG tablet  Commonly known as:  SYNTHROID, LEVOTHROID  Take 100 mcg by mouth daily before supper. Takes 1/2 tablet     losartan 50 MG tablet  Commonly known as:  COZAAR  Take 50 mg by mouth daily.     Magnesium 250 MG Tabs  Take 1 tablet by mouth 2 (two) times daily.     multivitamin with minerals  Tabs tablet  Take 1 tablet by mouth daily.     nitroGLYCERIN 0.4 MG SL tablet  Commonly known as:  NITROSTAT  Place 0.4 mg under the tongue every 5 (five) minutes as needed. Chest pains     pravastatin 40 MG tablet  Commonly known as:  PRAVACHOL  Take 40 mg by mouth at bedtime.     traMADol 50 MG tablet  Commonly known as:  ULTRAM  TAKE 1 TABLET BY MOUTH THREE TO FOUR TIMES A DAY AS NEEDED FOR PAIN     vitamin C 500 MG tablet  Commonly known as:  ASCORBIC ACID  Take 500 mg by mouth 2 (two) times daily.     Vitamin D3 1000 UNITS Caps  Take 1,000 capsules by mouth 2 (two) times daily.        Medical History:  Past Medical History  Diagnosis Date  . Hard of hearing   . Coronary artery disease   . Hearing deficit     hearing aids- bilateral  . Anemia   . Arthritis     stenosis - back, neck  . Cancer     renal neoplasm  . GERD (gastroesophageal reflux disease)   . Hypertension  sees Dr. Wynonia Lawman, stress test recently for prep for surgery  . Chronic kidney disease     renalCa- nephrectomy [facility]) 04/2012  . Hypothyroidism   . Vitamin D deficiency   . Prediabetes    Allergies: No Known Allergies  ROS Constitutional: Denies fever, chills, headaches, insomnia, fatigue, night sweats Eyes: Denies redness, blurred vision, diplopia, discharge, itchy, watery eyes.  ENT: + Decreased hearing with hearing aids Denies congestion, post nasal drip, sore throat, earache, dental pain, Tinnitus, Vertigo, Sinus pain, snoring.  Cardio: Denies chest pain, palpitations, irregular heartbeat, dyspnea, diaphoresis, orthopnea, PND, claudication, edema Respiratory: denies cough, shortness of breath, wheezing.  Gastrointestinal: Denies dysphagia, heartburn, AB pain/ cramps, N/V, diarrhea, constipation, hematemesis, melena, hematochezia,  hemorrhoids Genitourinary: Denies dysuria, frequency, urgency, nocturia, hesitancy, discharge, hematuria, flank pain Musculoskeletal: + LBP, leg pain Denies  myalgia, stiffness, swelling and strain/sprain. Skin: Denies pruritis, rash, changing in skin lesion Neuro: + paresthesia due to back Denies Weakness, tremor, incoordination, spasms, pain Psychiatric: Denies confusion, memory loss, sensory loss Endocrine: Denies change in weight, skin, hair change, nocturia Diabetic Polys, Denies visual blurring, hyper /hypo glycemic episodes Heme/Lymph: Denies Excessive bleeding, bruising, enlarged lymph nodes  Family history- Review and unchanged Social history- Review and unchanged Physical Exam: Filed Vitals:   11/24/13 1026  BP: 140/60  Pulse: 80  Temp: 97.9 F (36.6 C)  Resp: 16   Filed Weights   11/24/13 1026  Weight: 239 lb (108.41 kg)   General Appearance: Well nourished, in no apparent distress. Eyes: PERRLA, EOMs, conjunctiva no swelling or erythema Sinuses: No Frontal/maxillary tenderness ENT/Mouth: Ext aud canals clear, TMs without erythema, bulging. No erythema, swelling, or exudate on post pharynx.  Tonsils not swollen or erythematous. Hearing decreased Neck: Supple, thyroid normal.  Respiratory: Respiratory effort normal, BS equal bilaterally without rales, rhonchi, wheezing or stridor.  Cardio: RRR with no MRGs. Brisk peripheral pulses with 1-2 + edema (did not take fluid pill yet) Abdomen: Soft, obese + BS.  Non tender, no guarding, rebound, hernias, masses. Lymphatics: Non tender without lymphadenopathy.  Musculoskeletal: Full ROM, shuffling gait with walker, decreased strength and decreased bilateral lower extremity reflexes.  Skin: Warm, dry without rashes, lesions,. Neuro: Cranial nerves intact. Normal muscle tone, no cerebellar symptoms.  Psych: Awake and oriented X 3, normal affect. Assessment and Plan:  Hypertension: Continue medication, monitor blood pressure at home.  Continue DASH diet. Cholesterol: Continue diet and exercise. Check cholesterol.  Pre-diabetes-Continue diet and exercise. Check A1C Vitamin D Def-  check level and continue medications.  Spinal stenosis/knee pain- Tramadol 100 BID #60 2 RF Deconditioning- continue PT, + fall risk  Continue diet and meds as discussed. Further disposition pending results of labs.  Vicie Mutters 10:39 AM

## 2013-11-24 NOTE — Patient Instructions (Signed)
Bad carbs also include fruit juice, alcohol, and sweet tea. These are empty calories that do not signal to your brain that you are full.   Please remember the good carbs are still carbs which convert into sugar. So please measure them out no more than 1/2-1 cup of rice, oatmeal, pasta, and beans.  Veggies are however free foods! Pile them on.   I like lean protein at every meal such as chicken, [place], pork chops, cottage cheese, etc. Just do not fry these meats and please center your meal around vegetable, the meats should be a side dish.   No all fruit is created equal. Please see the list below, the fruit at the bottom is higher in sugars than the fruit at the top   Cholesterol Cholesterol is a white, waxy, fat-like protein needed by your body in small amounts. The liver makes all the cholesterol you need. It is carried from the liver by the blood through the blood vessels. Deposits (plaque) may build up on blood vessel walls. This makes the arteries narrower and stiffer. Plaque increases the risk for heart attack and stroke. You cannot feel your cholesterol level even if it is very high. The only way to know is by a blood test to check your lipid (fats) levels. Once you know your cholesterol levels, you should keep a record of the test results. Work with your caregiver to to keep your levels in the desired range. WHAT THE RESULTS MEAN:  Total cholesterol is a rough measure of all the cholesterol in your blood.  LDL is the so-called bad cholesterol. This is the type that deposits cholesterol in the walls of the arteries. You want this level to be low.  HDL is the good cholesterol because it cleans the arteries and carries the LDL away. You want this level to be high.  Triglycerides are fat that the body can either burn for energy or store. High levels are closely linked to heart disease. DESIRED LEVELS:  Total cholesterol below 200.  LDL below 100 for people at risk, below 70 for very  high risk.  HDL above 50 is good, above 60 is best.  Triglycerides below 150. HOW TO LOWER YOUR CHOLESTEROL:  Diet.  Choose fish or white meat chicken and [place], roasted or baked. Limit fatty cuts of red meat, fried foods, and processed meats, such as sausage and lunch meat.  Eat lots of fresh fruits and vegetables. Choose whole grains, beans, pasta, potatoes and cereals.  Use only small amounts of olive, corn or canola oils. Avoid butter, mayonnaise, shortening or palm kernel oils. Avoid foods with trans-fats.  Use skim/nonfat milk and low-fat/nonfat yogurt and cheeses. Avoid whole milk, cream, ice cream, egg yolks and cheeses. Healthy desserts include angel food cake, ginger snaps, animal crackers, hard candy, popsicles, and low-fat/nonfat frozen yogurt. Avoid pastries, cakes, pies and cookies.  Exercise.  A regular program helps decrease LDL and raises HDL.  Helps with weight control.  Do things that increase your activity level like gardening, walking, or taking the stairs.  Medication.  May be prescribed by your caregiver to help lowering cholesterol and the risk for heart disease.  You may need medicine even if your levels are normal if you have several risk factors. HOME CARE INSTRUCTIONS   Follow your diet and exercise programs as suggested by your caregiver.  Take medications as directed.  Have blood work done when your caregiver feels it is necessary. MAKE SURE YOU:   Understand  these instructions.  Will watch your condition.  Will get help right away if you are not doing well or get worse. Document Released: 07/22/2001 Document Revised: 01/19/2012 Document Reviewed: 08/10/2013 [facility] Patient Information 2014 [place], [place].

## 2013-11-27 ENCOUNTER — Other Ambulatory Visit: Payer: Self-pay | Admitting: Internal Medicine

## 2013-12-14 ENCOUNTER — Other Ambulatory Visit (HOSPITAL_COMMUNITY): Payer: Self-pay | Admitting: Urology

## 2013-12-14 ENCOUNTER — Ambulatory Visit (HOSPITAL_COMMUNITY)
Admission: RE | Admit: 2013-12-14 | Discharge: 2013-12-14 | Disposition: A | Discharge status: Home / Self Care | Payer: PRIVATE HEALTH INSURANCE | Source: Ambulatory Visit | Attending: Urology | Admitting: Urology

## 2013-12-14 DIAGNOSIS — M47814 Spondylosis without myelopathy or radiculopathy, thoracic region: Secondary | ICD-10-CM | POA: Insufficient documentation

## 2013-12-14 DIAGNOSIS — C649 Malignant neoplasm of unspecified kidney, except renal pelvis: Secondary | ICD-10-CM | POA: Insufficient documentation

## 2013-12-20 ENCOUNTER — Ambulatory Visit (INDEPENDENT_AMBULATORY_CARE_PROVIDER_SITE_OTHER): Payer: Medicare Other | Admitting: Internal Medicine

## 2013-12-20 ENCOUNTER — Emergency Department (HOSPITAL_COMMUNITY): Payer: Medicare Other

## 2013-12-20 ENCOUNTER — Encounter: Payer: Self-pay | Admitting: Internal Medicine

## 2013-12-20 ENCOUNTER — Emergency Department (HOSPITAL_COMMUNITY)
Admission: EM | Admit: 2013-12-20 | Discharge: 2013-12-20 | Disposition: A | Discharge status: Home / Self Care | Payer: Medicare Other | Attending: Emergency Medicine | Admitting: Emergency Medicine

## 2013-12-20 ENCOUNTER — Encounter (HOSPITAL_COMMUNITY): Payer: Self-pay | Admitting: Emergency Medicine

## 2013-12-20 VITALS — BP 88/46 | HR 60 | Temp 97.5°F | Resp 16

## 2013-12-20 DIAGNOSIS — E559 Vitamin D deficiency, unspecified: Secondary | ICD-10-CM | POA: Insufficient documentation

## 2013-12-20 DIAGNOSIS — E039 Hypothyroidism, unspecified: Secondary | ICD-10-CM | POA: Insufficient documentation

## 2013-12-20 DIAGNOSIS — E860 Dehydration: Secondary | ICD-10-CM

## 2013-12-20 DIAGNOSIS — H9190 Unspecified hearing loss, unspecified ear: Secondary | ICD-10-CM | POA: Insufficient documentation

## 2013-12-20 DIAGNOSIS — Z789 Other specified health status: Secondary | ICD-10-CM | POA: Insufficient documentation

## 2013-12-20 DIAGNOSIS — Z79899 Other long term (current) drug therapy: Secondary | ICD-10-CM | POA: Insufficient documentation

## 2013-12-20 DIAGNOSIS — Z7982 Long term (current) use of aspirin: Secondary | ICD-10-CM | POA: Insufficient documentation

## 2013-12-20 DIAGNOSIS — R112 Nausea with vomiting, unspecified: Secondary | ICD-10-CM | POA: Insufficient documentation

## 2013-12-20 DIAGNOSIS — M47814 Spondylosis without myelopathy or radiculopathy, thoracic region: Secondary | ICD-10-CM | POA: Insufficient documentation

## 2013-12-20 DIAGNOSIS — Z9861 Coronary angioplasty status: Secondary | ICD-10-CM | POA: Insufficient documentation

## 2013-12-20 DIAGNOSIS — K56600 Partial intestinal obstruction, unspecified as to cause: Secondary | ICD-10-CM

## 2013-12-20 DIAGNOSIS — M47812 Spondylosis without myelopathy or radiculopathy, cervical region: Secondary | ICD-10-CM | POA: Insufficient documentation

## 2013-12-20 DIAGNOSIS — K219 Gastro-esophageal reflux disease without esophagitis: Secondary | ICD-10-CM | POA: Insufficient documentation

## 2013-12-20 DIAGNOSIS — R630 Anorexia: Secondary | ICD-10-CM | POA: Insufficient documentation

## 2013-12-20 DIAGNOSIS — I2510 Atherosclerotic heart disease of native coronary artery without angina pectoris: Secondary | ICD-10-CM | POA: Insufficient documentation

## 2013-12-20 DIAGNOSIS — D649 Anemia, unspecified: Secondary | ICD-10-CM | POA: Insufficient documentation

## 2013-12-20 DIAGNOSIS — Z9089 Acquired absence of other organs: Secondary | ICD-10-CM | POA: Insufficient documentation

## 2013-12-20 DIAGNOSIS — Z905 Acquired absence of kidney: Secondary | ICD-10-CM | POA: Insufficient documentation

## 2013-12-20 DIAGNOSIS — K56609 Unspecified intestinal obstruction, unspecified as to partial versus complete obstruction: Secondary | ICD-10-CM

## 2013-12-20 DIAGNOSIS — N289 Disorder of kidney and ureter, unspecified: Secondary | ICD-10-CM

## 2013-12-20 DIAGNOSIS — N189 Chronic kidney disease, unspecified: Secondary | ICD-10-CM | POA: Insufficient documentation

## 2013-12-20 DIAGNOSIS — Z87891 Personal history of nicotine dependence: Secondary | ICD-10-CM | POA: Insufficient documentation

## 2013-12-20 DIAGNOSIS — I129 Hypertensive chronic kidney disease with stage 1 through stage 4 chronic kidney disease, or unspecified chronic kidney disease: Secondary | ICD-10-CM | POA: Insufficient documentation

## 2013-12-20 DIAGNOSIS — Z85528 Personal history of other malignant neoplasm of kidney: Secondary | ICD-10-CM | POA: Insufficient documentation

## 2013-12-20 LAB — CBC WITH DIFFERENTIAL/PLATELET
BASOS PCT: 1 % (ref 0–1)
Basophils Absolute: 0.1 10*3/uL (ref 0.0–0.1)
EOS ABS: 0.2 10*3/uL (ref 0.0–0.7)
Eosinophils Relative: 2 % (ref 0–5)
HEMATOCRIT: 33.2 % — AB (ref 39.0–52.0)
HEMOGLOBIN: 10.9 g/dL — AB (ref 13.0–17.0)
Lymphocytes Relative: 26 % (ref 12–46)
Lymphs Abs: 2.4 10*3/uL (ref 0.7–4.0)
MCH: 31.2 pg (ref 26.0–34.0)
MCHC: 32.8 g/dL (ref 30.0–36.0)
MCV: 95.1 fL (ref 78.0–100.0)
MONO ABS: 1 10*3/uL (ref 0.1–1.0)
MONOS PCT: 10 % (ref 3–12)
Neutro Abs: 5.7 10*3/uL (ref 1.7–7.7)
Neutrophils Relative %: 61 % (ref 43–77)
Platelets: 225 10*3/uL (ref 150–400)
RBC: 3.49 MIL/uL — ABNORMAL LOW (ref 4.22–5.81)
RDW: 13.8 % (ref 11.5–15.5)
WBC: 9.4 10*3/uL (ref 4.0–10.5)

## 2013-12-20 LAB — COMPREHENSIVE METABOLIC PANEL
ALBUMIN: 3.2 g/dL — AB (ref 3.5–5.2)
ALT: 10 U/L (ref 0–53)
AST: 15 U/L (ref 0–37)
Alkaline Phosphatase: 94 U/L (ref 39–117)
BUN: 36 mg/dL — AB (ref 6–23)
CO2: 20 mEq/L (ref 19–32)
CREATININE: 2.11 mg/dL — AB (ref 0.50–1.35)
Calcium: 8.8 mg/dL (ref 8.4–10.5)
Chloride: 97 mEq/L (ref 96–112)
GFR calc Af Amer: 32 mL/min — ABNORMAL LOW (ref >90)
GFR calc non Af Amer: 27 mL/min — ABNORMAL LOW (ref >90)
Glucose, Bld: 123 mg/dL — ABNORMAL HIGH (ref 70–99)
Potassium: 4 mEq/L (ref 3.7–5.3)
Sodium: 133 mEq/L — ABNORMAL LOW (ref 137–147)
TOTAL PROTEIN: 6.8 g/dL (ref 6.0–8.3)
Total Bilirubin: 0.5 mg/dL (ref 0.3–1.2)

## 2013-12-20 LAB — URINALYSIS, ROUTINE W REFLEX MICROSCOPIC
Bilirubin Urine: NEGATIVE
Glucose, UA: NEGATIVE mg/dL
Hgb urine dipstick: NEGATIVE
KETONES UR: NEGATIVE mg/dL
NITRITE: NEGATIVE
PH: 5.5 (ref 5.0–8.0)
Protein, ur: 30 mg/dL — AB
SPECIFIC GRAVITY, URINE: 1.017 (ref 1.005–1.030)
Urobilinogen, UA: 0.2 mg/dL (ref 0.0–1.0)

## 2013-12-20 LAB — URINE MICROSCOPIC-ADD ON

## 2013-12-20 LAB — LIPASE, BLOOD: LIPASE: 51 U/L (ref 11–59)

## 2013-12-20 MED ORDER — IOHEXOL 300 MG/ML  SOLN
50.0000 mL | Freq: Once | INTRAMUSCULAR | Status: DC | PRN
Start: 2013-12-20 — End: 2013-12-21

## 2013-12-20 MED ORDER — SODIUM CHLORIDE 0.9 % IV BOLUS (SEPSIS)
500.0000 mL | Freq: Once | INTRAVENOUS | Status: AC
  Administered 2013-12-20: 500 mL via INTRAVENOUS

## 2013-12-20 NOTE — ED Notes (Signed)
Pt tolerating PO fluids at this time.

## 2013-12-20 NOTE — Progress Notes (Signed)
Subjective:    Patient ID: [name], male    DOB: 1928/12/16, 80 y.o.   MRN: [id number]  Emesis  Associated symptoms include abdominal pain and dizziness. Pertinent negatives include no chills, diarrhea, fever, headaches, myalgias or weight loss.  Abdominal Pain The current episode started in the past 7 days. The onset quality is gradual. The problem occurs 2 to 4 times per day. The problem has been gradually worsening. The pain is located in the generalized abdominal region. The pain is mild. The quality of the pain is colicky, cramping, aching and a sensation of fullness. The abdominal pain does not radiate. Associated symptoms include belching, constipation, nausea and vomiting. Pertinent negatives include no anorexia, diarrhea, dysuria, fever, flatus, frequency, headaches, hematochezia, hematuria, melena, myalgias or weight loss.      Medication List       This list is accurate as of: 12/20/13  3:04 PM.  Always use your most recent med list.               allopurinol 300 MG tablet  Commonly known as:  ZYLOPRIM  TAKE 1 TABLET DAILY FOR GOUT     amLODipine 5 MG tablet  Commonly known as:  NORVASC  Take 5 mg by mouth daily before breakfast.     bumetanide 2 MG tablet  Commonly known as:  BUMEX  Takes 1/2-1 pill daily for fluid     famotidine 40 MG tablet  Commonly known as:  PEPCID  Take 40 mg by mouth daily as needed. stomach     HYDROcodone-acetaminophen 5-325 MG per tablet  Commonly known as:  NORCO/VICODIN  Take 1-2 tablets by mouth every 4 (four) hours as needed.     levothyroxine 200 MCG tablet  Commonly known as:  SYNTHROID, LEVOTHROID  Takes 1/2-1 tablet daily     losartan 50 MG tablet  Commonly known as:  COZAAR  Take 50 mg by mouth daily.     Magnesium 250 MG Tabs  Take 1 tablet by mouth 2 (two) times daily.     multivitamin with minerals Tabs tablet  Take 1 tablet by mouth daily.     nitroGLYCERIN 0.4 MG SL tablet  Commonly known as:   NITROSTAT  Place 0.4 mg under the tongue every 5 (five) minutes as needed. Chest pains     pravastatin 40 MG tablet  Commonly known as:  PRAVACHOL  Take 1 tablet (40 mg total) by mouth at bedtime.     traMADol 50 MG tablet  Commonly known as:  ULTRAM  TAKE 1 TABLET BY MOUTH THREE TO FOUR TIMES A DAY AS NEEDED FOR PAIN     vitamin C 500 MG tablet  Commonly known as:  ASCORBIC ACID  Take 500 mg by mouth 2 (two) times daily.     Vitamin D3 1000 UNITS Caps  Take 1,000 capsules by mouth 2 (two) times daily.       No Known Allergies Past Medical History  Diagnosis Date  . Hard of hearing   . Coronary artery disease   . Hearing deficit     hearing aids- bilateral  . Anemia   . Arthritis     stenosis - back, neck  . Cancer     renal neoplasm  . GERD (gastroesophageal reflux disease)   . Hypertension     sees Dr. Wynonia Lawman, stress test recently for prep for surgery  . Chronic kidney disease     renalCa- nephrectomy [facility]) 04/2012  . Hypothyroidism   .  Vitamin D deficiency   . Prediabetes      Review of Systems  Constitutional: Positive for fatigue. Negative for fever, chills, weight loss, diaphoresis and appetite change.  HENT: Negative.   Respiratory: Negative.   Cardiovascular: Negative.   Gastrointestinal: Positive for nausea, vomiting, abdominal pain, constipation and abdominal distention. Negative for diarrhea, blood in stool, melena, hematochezia, anal bleeding, rectal pain, anorexia and flatus.  Endocrine: Negative.   Genitourinary: Negative.  Negative for dysuria, frequency and hematuria.  Musculoskeletal: Negative.  Negative for myalgias.  Skin: Negative.   Neurological: Positive for dizziness, weakness and light-headedness. Negative for tremors, syncope, facial asymmetry, speech difficulty and headaches.       Objective:   Physical Exam  Constitutional:  Pale and ashen  Eyes: Pupils are equal, round, and reactive to light.  Conjunctiva dull   Neck: Normal  range of motion. Neck supple. No JVD present. No thyromegaly present.  Cardiovascular: Normal rate, regular rhythm and normal heart sounds.   No murmur heard. Pulmonary/Chest: Effort normal and breath sounds normal.  Abdominal: He exhibits distension. He exhibits no mass. There is tenderness. There is no rebound and no guarding.  Musculoskeletal: Normal range of motion.  In A wheelchair  Lymphadenopathy:    He has no cervical adenopathy.  Neurological: No cranial nerve deficit. Coordination normal.  Mentally dulled  Skin: Skin is warm and dry. No rash noted. No erythema. There is pallor.      Assessment & Plan:   1. Partial small bowel obstruction   2. Dehydration   Recc Non emergency transport to ER - daughter advised will likely need admission for hydration  And hopefully not surgery

## 2013-12-20 NOTE — ED Notes (Signed)
Patient refused urethral catheterization. Patient attempting to void in urinal again.

## 2013-12-20 NOTE — ED Notes (Signed)
Pt still at side of bed with urinal.

## 2013-12-20 NOTE — ED Notes (Addendum)
Pt still at side of bed with urinal, attempting to urinate.

## 2013-12-20 NOTE — ED Notes (Signed)
Pt wasn't able to void when we asked. He asked to sit on side of bed when he was ready to obtain urine. He said he would call when he is ready.

## 2013-12-20 NOTE — ED Provider Notes (Signed)
CSN: [id number]     Arrival date & time 12/20/13  1536 History   First MD Initiated Contact with Patient 12/20/13 1554     Chief Complaint  Patient presents with  . Dehydration  . Emesis     (Consider location/radiation/quality/duration/timing/severity/associated sxs/prior Treatment) HPI Pt presents from [place] office for evaluation of dehdyration.  Pt has had vomiting over the past 3 days with a sensation of abdominal pain- however he denies any abdominal pain during my evalution.  No fever/hcills.  He has had decreased appetite adn decreased po intake.  No vomiting today.  Emesis nonbloody and nonbilious.  Pt was hypotensive at his doctors office today. No dizziness or fainting.  No chest pain or shortness of breath.  Last BM 3 days ago on the day his symptom started. Denies dysuria. There are no other associated systemic symptoms, there are no other alleviating or modifying factors.    Past Medical History  Diagnosis Date  . Hard of hearing   . Coronary artery disease   . Hearing deficit     hearing aids- bilateral  . Anemia   . Arthritis     stenosis - back, neck  . Cancer     renal neoplasm  . GERD (gastroesophageal reflux disease)   . Hypertension     sees Dr. Wynonia Lawman, stress test recently for prep for surgery  . Chronic kidney disease     renalCa- nephrectomy [facility]) 04/2012  . Hypothyroidism   . Vitamin D deficiency   . Prediabetes    Past Surgical History  Procedure Laterality Date  . Joint replacement      bilateral knee replacements   . Cholecystectomy    . Tonsillectomy    . Appendectomy    . Eye surgery      right eye cataract surgery   . Laparoscopic nephrectomy  04/29/2012    Procedure: LAPAROSCOPIC NEPHRECTOMY;  Surgeon: Dutch Gray, MD;  Location: [facility];  Service: Urology;  Laterality: Left;      . Coronary angioplasty      stents , + 7-104yrs. ago  . Back surgery      lower back surgery - 1990's  . Anterior cervical decomp/discectomy fusion N/A 01/18/2013     Procedure: ANTERIOR CERVICAL DECOMPRESSION/DISCECTOMY FUSION 2 LEVELS;  Surgeon: Faythe Ghee, MD;  Location: [facility];  Service: Neurosurgery;  Laterality: N/A;   History reviewed. No pertinent family history. History  Substance Use Topics  . Smoking status: Former Smoker    Types: Cigarettes    Quit date: 11/10/1970  . Smokeless tobacco: Never Used  . Alcohol Use: 6.6 oz/week    2 Cans of beer, 9 Shots of liquor per week     Comment: friday- beer & 3 drambuie    Review of Systems ROS reviewed and all otherwise negative except for mentioned in HPI    Allergies  Review of patient's allergies indicates no known allergies.  Home Medications   Current Outpatient Rx  Name  Route  Sig  Dispense  Refill  . allopurinol (ZYLOPRIM) 300 MG tablet   Oral   Take 150 mg by mouth daily.         Marland Kitchen amLODipine (NORVASC) 5 MG tablet   Oral   Take 5 mg by mouth daily before breakfast.          . aspirin EC 81 MG tablet   Oral   Take 81 mg by mouth daily.         Marland Kitchen  bumetanide (BUMEX) 2 MG tablet      Takes 1/2-1 pill daily for fluid   90 tablet   1   . Cholecalciferol (VITAMIN D3) 1000 UNITS CAPS   Oral   Take 1,000 capsules by mouth 2 (two) times daily.         . famotidine (PEPCID) 40 MG tablet   Oral   Take 40 mg by mouth daily as needed. stomach         . FERROUS SULFATE PO   Oral   Take 1 tablet by mouth daily.         Marland Kitchen levothyroxine (SYNTHROID, LEVOTHROID) 200 MCG tablet   Oral   Take 100 mcg by mouth daily before breakfast.         . losartan (COZAAR) 50 MG tablet   Oral   Take 50 mg by mouth daily.         . Magnesium 250 MG TABS   Oral   Take 500 mg by mouth daily.          . Multiple Vitamin (MULITIVITAMIN WITH MINERALS) TABS   Oral   Take 1 tablet by mouth daily.         . nitroGLYCERIN (NITROSTAT) 0.4 MG SL tablet   Sublingual   Place 0.4 mg under the tongue every 5 (five) minutes as needed for chest pain.           . pravastatin (PRAVACHOL) 40 MG tablet   Oral   Take 1 tablet (40 mg total) by mouth at bedtime.   90 tablet   1   . traMADol (ULTRAM) 50 MG tablet      TAKE 1 TABLET BY MOUTH THREE TO FOUR TIMES A DAY AS NEEDED FOR PAIN   100 tablet   2     No refills available for controlled drug   . vitamin C (ASCORBIC ACID) 500 MG tablet   Oral   Take 500 mg by mouth 2 (two) times daily.          BP 159/59  Pulse 80  Temp(Src) 97.9 F (36.6 C) (Oral)  Resp 20  SpO2 98% Vitals reviewed Physical Exam Physical Examination: General appearance - alert, well appearing, and in no distress Mental status - alert, oriented to person, place, and time Eyes - no conjunctival injection, no scleral icterus Mouth - mucous membranes tacky, op without lesions Chest - clear to auscultation, no wheezes, rales or rhonchi, symmetric air entry Heart - normal rate, regular rhythm, normal S1, S2, no murmurs, rubs, clicks or gallops Abdomen - soft, nontender, nondistended, no masses or organomegaly, nabs Extremities - peripheral pulses normal, no pedal edema, no clubbing or cyanosis Skin - normal coloration and turgor, no rashes  ED Course  Procedures (including critical care time)  8:21 PM pt rechecked after all results obtained.  Discussed with patient and daughter at bedside.  Here in the ED pt has had normal blood pressure on each measurement.  No signs of significant dehydration requiring admission.  No signs of bowel obstruction.  Renal insufficiency is at baseline.  Signs of lesion on right kidney were present on 7/14- daughter states that they have followup scheduled already for this with urology.  Labs Review Labs Reviewed  CBC WITH DIFFERENTIAL - Abnormal; Notable for the following:    RBC 3.49 (*)    Hemoglobin 10.9 (*)    HCT 33.2 (*)    All other components within normal limits  COMPREHENSIVE METABOLIC PANEL -  Abnormal; Notable for the following:    Sodium 133 (*)    Glucose, Bld 123 (*)     BUN 36 (*)    Creatinine, Ser 2.11 (*)    Albumin 3.2 (*)    GFR calc non Af Amer 27 (*)    GFR calc Af Amer 32 (*)    All other components within normal limits  URINALYSIS, ROUTINE W REFLEX MICROSCOPIC - Abnormal; Notable for the following:    APPearance CLOUDY (*)    Protein, ur 30 (*)    Leukocytes, UA MODERATE (*)    All other components within normal limits  URINE MICROSCOPIC-ADD ON - Abnormal; Notable for the following:    Squamous Epithelial / LPF FEW (*)    Casts HYALINE CASTS (*)    All other components within normal limits  LIPASE, BLOOD   Imaging Review Ct Abdomen Pelvis Wo Contrast  12/20/2013   CLINICAL DATA:  Dysuria  EXAM: CT ABDOMEN AND PELVIS WITHOUT CONTRAST  TECHNIQUE: Multidetector CT imaging of the abdomen and pelvis was performed following the standard protocol without intravenous contrast.  COMPARISON:  Prior CT from 06/07/2013.  FINDINGS: Bibasilar linear opacities are most consistent with atelectasis/scarring.  Small nodular density measuring 1.3 x 1.0 cm extending from the posterior aspect of the inferior right hepatic lobe is unchanged. The liver is otherwise unremarkable.  The gallbladder is surgically absent. No biliary ductal dilatation. The spleen is within normal limits. Adrenal glands and pancreas demonstrate a normal unenhanced appearance.  The patient is status post left nephrectomy. No soft tissue mass or other abnormality identified within the left renal fossa.  On the right, a 1.3 cm exophytic hypodense lesion extending from the upper pole is present (series 2, image 24). While this lesion is visualized on the prior exam, there appears to be slightly increased hyperdensity relative to the prior, which may represent hemorrhage or proteinaceous debris. No nephrolithiasis or hydronephrosis. No stones seen along the course of the right renal collecting system.  No evidence of bowel obstruction. No wall thickening or inflammatory fat stranding seen about the  bowels.  Asymmetric thickening of the left bladder wall with associated 13 mm diverticulum again noted, similar to prior. Additional small diverticula noted.  Prostate is unremarkable.  No free air or fluid. No enlarged intra-abdominal pelvic lymph nodes.  Prominent atherosclerotic disease seen throughout the intra-abdominal aorta and its branch vessels.  Severe degenerative changes noted about both hips and within the visualized spine, unchanged.  IMPRESSION: 1. No acute intra-abdominal or pelvic process identified. 2. 1.3 cm exophytic lesion arising from the anterior aspect of the upper pole of the right kidney. While this lesion is visualized on the prior exam from 06/07/2013, there is question of slightly increased hyperdensity relative to prior, which may represent hemorrhage or proteinaceous debris. Possible renal malignancy is not entirely excluded. Further evaluation with renal mass protocol CT and/or MRI could be performed for further evaluation as clinically indicated. 3. No evidence of locally recurrent disease within the left nephrectomy bed. 4. Stable appearance of asymmetric left bladder wall thickening with associated diverticula. 5. Stable small nodule extending from the posterior aspect of the right hepatic lobe.   Electronically Signed   By: Jeannine Boga M.D.   On: 12/20/2013 18:27    EKG Interpretation   None       MDM   Final diagnoses:  Nausea and vomiting  Renal insufficiency    Pt presenting with c/o emesis and diffuse abdominal pain with  decreased appetite and no BM x 3 days.  Pt has normal blood pressure in the ED. He feels much improved after IV fluids.  Labs are reassuring.  Renal insufficiency is at baseline.  Discharged with strict return precautions.  Pt agreeable with plan.    Threasa Beards, MD 12/21/13 [phone]

## 2013-12-20 NOTE — Discharge Instructions (Signed)
Return to the ED with any concerns including difficulty breathing, vomiting and not able to keep down liquids, fainting, chest pain, decreased level of alertness/lethargy, or any other alarming symptoms

## 2013-12-20 NOTE — ED Notes (Signed)
Patient up to side of bed attempting to use urinal for urine specimen.

## 2013-12-20 NOTE — ED Notes (Signed)
Pt aware of need for urine specimen. 

## 2013-12-20 NOTE — ED Notes (Signed)
Writer gave pt some water for fluid challenge

## 2013-12-20 NOTE — ED Notes (Addendum)
Per EMS patient with Hx of renal cancer and kidney removal from [place] [facility] for dehydration, some transient confusion on EMS arrival which has resolved, stomach "bug" with some nausea and vomiting, denies diarrhea. Per EMS patient is not currently nauseous, pt is currently mental baseline. Family states patient's last BM was last Thursday, that patient began getting sick last Thursday and vomited on Friday and has been anorexic and weak since then.

## 2013-12-27 ENCOUNTER — Ambulatory Visit: Payer: Self-pay | Admitting: Internal Medicine

## 2014-01-05 ENCOUNTER — Ambulatory Visit: Payer: Self-pay | Admitting: Internal Medicine

## 2014-01-23 ENCOUNTER — Ambulatory Visit (INDEPENDENT_AMBULATORY_CARE_PROVIDER_SITE_OTHER): Payer: Medicare Other | Admitting: Internal Medicine

## 2014-01-23 ENCOUNTER — Encounter: Payer: Self-pay | Admitting: Internal Medicine

## 2014-01-23 VITALS — BP 132/60 | HR 80 | Temp 98.8°F | Resp 18

## 2014-01-23 DIAGNOSIS — I10 Essential (primary) hypertension: Secondary | ICD-10-CM

## 2014-01-23 DIAGNOSIS — R5381 Other malaise: Secondary | ICD-10-CM

## 2014-01-23 DIAGNOSIS — Z79899 Other long term (current) drug therapy: Secondary | ICD-10-CM | POA: Insufficient documentation

## 2014-01-23 DIAGNOSIS — R5383 Other fatigue: Secondary | ICD-10-CM

## 2014-01-23 LAB — CBC WITH DIFFERENTIAL/PLATELET
Basophils Absolute: 0.1 10*3/uL (ref 0.0–0.1)
Basophils Relative: 1 % (ref 0–1)
EOS ABS: 0.3 10*3/uL (ref 0.0–0.7)
Eosinophils Relative: 4 % (ref 0–5)
HEMATOCRIT: 32.7 % — AB (ref 39.0–52.0)
HEMOGLOBIN: 10.6 g/dL — AB (ref 13.0–17.0)
Lymphocytes Relative: 23 % (ref 12–46)
Lymphs Abs: 2 10*3/uL (ref 0.7–4.0)
MCH: 30.6 pg (ref 26.0–34.0)
MCHC: 32.4 g/dL (ref 30.0–36.0)
MCV: 94.5 fL (ref 78.0–100.0)
MONO ABS: 0.7 10*3/uL (ref 0.1–1.0)
MONOS PCT: 8 % (ref 3–12)
NEUTROS PCT: 64 % (ref 43–77)
Neutro Abs: 5.6 10*3/uL (ref 1.7–7.7)
Platelets: 264 10*3/uL (ref 150–400)
RBC: 3.46 MIL/uL — ABNORMAL LOW (ref 4.22–5.81)
RDW: 15.4 % (ref 11.5–15.5)
WBC: 8.7 10*3/uL (ref 4.0–10.5)

## 2014-01-23 NOTE — Progress Notes (Signed)
Subjective:    Patient ID: [name], male    DOB: 1929-02-10, 80 y.o.   MRN: [id number]  HPI Patient is seen in 1 month F/U after presenting to the office dehydrated with abdominal distention and was sent by EMS Ambulance service to the ER for evaluation and was suspected to have a partial SBO and was given IV fluids for dehydration and observed and consequently released. Patient remains very sedentary walking minimal amounts at home with a walker and uses a wheel chair away from the house. He denies any recent or current GI Sx's. Primary complaint is of "feeling tired all of the time which I suspect is related to his sedentary life style.   Medication List       This list is accurate as of: 01/23/14  7:25 PM.  Always use your most recent med list.               allopurinol 300 MG tablet  Commonly known as:  ZYLOPRIM  Take 150 mg by mouth daily.     amLODipine 5 MG tablet  Commonly known as:  NORVASC  Take 5 mg by mouth daily before breakfast.     aspirin EC 81 MG tablet  Take 81 mg by mouth daily.     bumetanide 2 MG tablet  Commonly known as:  BUMEX  Takes 1/2-1 pill daily for fluid     famotidine 40 MG tablet  Commonly known as:  PEPCID  Take 40 mg by mouth daily as needed. stomach     FERROUS SULFATE PO  Take 1 tablet by mouth daily.     levothyroxine 200 MCG tablet  Commonly known as:  SYNTHROID, LEVOTHROID  Take 100 mcg by mouth daily before breakfast.     losartan 50 MG tablet  Commonly known as:  COZAAR  Take 50 mg by mouth daily.     Magnesium 250 MG Tabs  Take 500 mg by mouth daily.     multivitamin with minerals Tabs tablet  Take 1 tablet by mouth daily.     nitroGLYCERIN 0.4 MG SL tablet  Commonly known as:  NITROSTAT  Place 0.4 mg under the tongue every 5 (five) minutes as needed for chest pain.     pravastatin 40 MG tablet  Commonly known as:  PRAVACHOL  Take 1 tablet (40 mg total) by mouth at bedtime.     traMADol 50 MG tablet   Commonly known as:  ULTRAM  TAKE 1 TABLET BY MOUTH THREE TO FOUR TIMES A DAY AS NEEDED FOR PAIN     vitamin C 500 MG tablet  Commonly known as:  ASCORBIC ACID  Take 500 mg by mouth 2 (two) times daily.     Vitamin D3 1000 UNITS Caps  Take 1,000 capsules by mouth 2 (two) times daily.        No Known Allergies  Past Medical History  Diagnosis Date  . Hard of hearing   . Coronary artery disease   . Hearing deficit     hearing aids- bilateral  . Anemia   . Arthritis     stenosis - back, neck  . Cancer     renal neoplasm  . GERD (gastroesophageal reflux disease)   . Hypertension     sees Dr. Wynonia Lawman, stress test recently for prep for surgery  . Chronic kidney disease     renalCa- nephrectomy [facility]) 04/2012  . Hypothyroidism   . Vitamin D deficiency   . Prediabetes  Review of Systems 12 pt systems review negative to above.   BP 132/60  Pulse 80  Temp(Src) 98.8 F (37.1 C) (Temporal)  Resp 18  Objective:   Physical Exam  Constitutional: He is oriented to person, place, and time.  Obese endomorph habitus and sitting in a wheel chair.  HENT:  Right Ear: External ear normal.  Left Ear: External ear normal.  Nose: Nose normal.  Mouth/Throat: Oropharynx is clear and moist. No oropharyngeal exudate.  Eyes: Conjunctivae and EOM are normal. Pupils are equal, round, and reactive to light.  Neck: Normal range of motion. Neck supple. No JVD present. No thyromegaly present.  Cardiovascular: Normal rate and regular rhythm.  Exam reveals no gallop.   Murmur heard. Pulmonary/Chest: Effort normal and breath sounds normal. No respiratory distress. He has no wheezes. He has no rales.  Abdominal: Soft. Bowel sounds are normal. He exhibits no distension. There is no tenderness. There is no rebound and no guarding.  Musculoskeletal: Normal range of motion. He exhibits edema.  Lymphadenopathy:    He has no cervical adenopathy.  Neurological: He is alert and oriented to person,  place, and time. No cranial nerve deficit.  Skin: Skin is warm and dry. No rash noted. No erythema. No pallor.   Assessment & Plan:   1. Hypertension  2. Malaise and fatigue  3. Encounter for long-term (current) use of other medications - check labs to monitor hydration status - BASIC METABOLIC PANEL WITH GFR - CBC with Differential

## 2014-01-23 NOTE — Patient Instructions (Signed)
Hypertension As your heart beats, it forces blood through your arteries. This force is your blood pressure. If the pressure is too high, it is called hypertension (HTN) or high blood pressure. HTN is dangerous because you may have it and not know it. High blood pressure may mean that your heart has to work harder to pump blood. Your arteries may be narrow or stiff. The extra work puts you at risk for heart disease, stroke, and other problems.  Blood pressure consists of two numbers, a higher number over a lower, 110/72, for example. It is stated as "110 over 72." The ideal is below 120 for the top number (systolic) and under 80 for the bottom (diastolic). Write down your blood pressure today. You should pay close attention to your blood pressure if you have certain conditions such as:  Heart failure.  Prior heart attack.  Diabetes  Chronic kidney disease.  Prior stroke.  Multiple risk factors for heart disease. To see if you have HTN, your blood pressure should be measured while you are seated with your arm held at the level of the heart. It should be measured at least twice. A one-time elevated blood pressure reading (especially in the Emergency Department) does not mean that you need treatment. There may be conditions in which the blood pressure is different between your right and left arms. It is important to see your caregiver soon for a recheck. Most people have essential hypertension which means that there is not a specific cause. This type of high blood pressure may be lowered by changing lifestyle factors such as:  Stress.  Smoking.  Lack of exercise.  Excessive weight.  Drug/tobacco/alcohol use.  Eating less salt. Most people do not have symptoms from high blood pressure until it has caused damage to the body. Effective treatment can often prevent, delay or reduce that damage. TREATMENT  When a cause has been identified, treatment for high blood pressure is directed at the  cause. There are a large number of medications to treat HTN. These fall into several categories, and your caregiver will help you select the medicines that are best for you. Medications may have side effects. You should review side effects with your caregiver. If your blood pressure stays high after you have made lifestyle changes or started on medicines,   Your medication(s) may need to be changed.  Other problems may need to be addressed.  Be certain you understand your prescriptions, and know how and when to take your medicine.  Be sure to follow up with your caregiver within the time frame advised (usually within two weeks) to have your blood pressure rechecked and to review your medications.  If you are taking more than one medicine to lower your blood pressure, make sure you know how and at what times they should be taken. Taking two medicines at the same time can result in blood pressure that is too low. SEEK IMMEDIATE MEDICAL CARE IF:  You develop a severe headache, blurred or changing vision, or confusion.  You have unusual weakness or numbness, or a faint feeling.  You have severe chest or abdominal pain, vomiting, or breathing problems. MAKE SURE YOU:   Understand these instructions.  Will watch your condition.  Will get help right away if you are not doing well or get worse. Document Released: 10/27/2005 Document Revised: 01/19/2012 Document Reviewed: 06/16/2008 [facility] Patient Information 2014 [place].  Diabetes, Type 2, Am I At Risk? Diabetes is a lasting (chronic) disease. In type  2 diabetes, the pancreas does not make enough insulin, and the body does not respond normally to the insulin that is made. This type of diabetes was also previously called adult onset diabetes. About 90% of all those who have diabetes have type 2. It usually occurs after the age of 93, but can occur at any age.  People develop type 2 diabetes because they do not use insulin properly.  Eventually, the pancreas cannot make enough insulin for the body's needs. Over time, the amount of glucose (sugar) in the blood increases. RISK FACTORS  Overweight  the more weight you have, the more resistant your cells become to insulin.  Family history  you are more likely to get diabetes if a parent or sibling has diabetes.  Race certain races get diabetes more.  African Americans.  American Indians.  Asian Americans.  Hispanics.  Pacific Islander.  Inactive exercise helps control weight and helps your cells be more sensitive to insulin.  Gestational diabetes  some women develop diabetes while they are pregnant. This goes away when they deliver. However, they are 50-60% more likely to develop type 2 diabetes at a later time.  Having a baby over 9 pounds  a sign that you may have had gestational diabetes.  Age the risk of diabetes goes up as you get older, especially after age 4.  High blood pressure (hypertension). SYMPTOMS Many people have no signs or symptoms. Symptoms can be so mild that you might not even notice them. Some of these signs are:  Increased thirst.  Increased hunger.  Tiredness (fatigue).  Increased urination, especially at night.  Weight loss.  Blurred vision.  Sores that do not heal. WHO SHOULD BE TESTED?  Anyone 24 years or older, especially if overweight, should consider getting tested.  If you are younger than 66, overweight, and have one or more of the risk factors, you should consider getting tested. DIAGNOSIS  Fasting blood glucose (FBS). Usually, 2 are done.  FBS 101-125 mg/dl is considered pre-diabetes.  FBS 126 mg/dl or greater is considered diabetes.  2 hour Oral Glucose Tolerance Test (OGTT). This test is preformed by first having you not eat or drink for several hours. You are then given something sweet to drink and your blood glucose is measured fasting, at one hour and 2 hours. This test tells how well you are able to  handle sugars or carbohydrates.  Fasting: 60-100 mg/dl.  1 hour: less than 200 mg/dl.  2 hours: less than 140 mg/dl.  A1c A1c is a blood glucose test that gives and average of your blood glucose over 3 months. It is the accepted method to use to diagnose diabetes.  A1c 5.7-6.4% is considered pre-diabetes.  A1c 6.5% or greater is considered diabetes. WHAT DOES IT MEAN TO HAVE PRE-DIABETES? Pre-diabetes means you are at risk for getting type 2 diabetes. Your blood glucose is higher than normal, but not yet high enough to diagnose diabetes. The good news is, if you have pre-diabetes you can reduce the risk of getting diabetes and even return to normal blood glucose levels. With modest weight loss and moderate physical activity, you can delay or prevent type 2 diabetes.  PREVENTION You cannot do anything about race, age or family history, but you can lower your chances of getting diabetes. You can:   Exercise regularly and be active.  Reduce fat and calorie intake.  Make wise food choices as much as you can.  Reduce your intake of salt and alcohol.  Maintain a reasonable weight.  Keep blood pressure in an acceptable range. Take medication if needed.  Not smoke.  Maintain an acceptable cholesterol level (HDL, LDL, Triglycerides). Take medication if needed. DOING MY PART: GETTING STARTED Making big changes in your life is hard, especially if you are faced with more than one change. You can make it easier by taking these steps:  Make a plan to change behavior.  Decide exactly what you will do and when you will do it.  Plan what you need to get ready.  Think about what might prevent you from reaching your goals.  Find family and friends who will support and encourage you.  Decide how you will reward yourself when you do what you have planned.  Your doctor, dietitian, or counselor can help you make a plan. HERE ARE SOME OF THE AREAS YOU MAY WISH TO CHANGE TO REDUCE YOUR RISK  OF DIABETES. If you are overweight or obese, choose sensible ways to get in shape. Even small amounts of weight loss, like 5-10 pounds, can help reduce the effects of insulin resistance and help blood glucose control. Diet  Avoid crash diets. Instead, eat less of the foods you usually have. Limit the amount of fat you eat.  Increase your physical activity. Aim for at least 30 minutes of exercise most days of the week.  Set a reasonable weight-loss goal, such as losing 1 pound a week. Aim for a long-term goal of losing 5-7% of your total body weight.  Make wise food choices most of the time.  What you eat has a big impact on your health. By making wise food choices, you can help control your body weight, blood pressure, and cholesterol.  Take a hard look at the serving sizes of the foods you eat. Reduce serving sizes of meat, desserts, and foods high in fat. Increase your intake of fruits and vegetables.  Limit your fat intake to about 25% of your total calories. For example, if your food choices add up to about 2,000 calories a day, try to eat no more than 56 grams of fat. Your caregiver or a dietitian can help you figure out how much fat to have. You can check food labels for fat content too.  You may also want to reduce the number of calories you have each day.  Keep a food log. Write down what you eat, how much you eat, and anything else that helps keep you on track.  When you meet your goal, reward yourself with a nonfood item or activity. Exercise  Be physically active every day.  Keep and exercise log. Write down what exercise you did, for how long, and anything else that keeps you on track.  Regular exercise (like brisk walking) tackles several risk factors at once. It helps you lose weight, it keeps your cholesterol and blood pressure under control, and it helps your body use insulin. People who are physically active for 30 minutes a day, 5 days a week, reduced their risk of type  2 diabetes. If you are not very active, you should start slowly at first. Talk with your caregiver first about what kinds of exercise would be safe for you. Make a plan to increase your activity level with the goal of being active for at least 30 minutes a day, most days of the week.  Choose activities you enjoy. Here are some ways to work extra activity into your daily routine:  Take the stairs rather than an elevator  or escalator.  Park at the far end of the lot and walk.  Get off the bus a few stops early and walk the rest of the way.  Walk or bicycle instead of drive whenever you can. Medications Some people need medication to help control their blood pressure or cholesterol levels. If you do, take your medicines as directed. Ask your caregiver whether there are any medicines you can take to prevent type 2 diabetes. Document Released: 10/30/2003 Document Revised: 01/19/2012 Document Reviewed: 07/25/2009 [facility] Patient Information 2014 [place].

## 2014-01-24 LAB — BASIC METABOLIC PANEL WITH GFR
BUN: 31 mg/dL — AB (ref 6–23)
CO2: 25 mEq/L (ref 19–32)
Calcium: 8.9 mg/dL (ref 8.4–10.5)
Chloride: 103 mEq/L (ref 96–112)
Creat: 1.97 mg/dL — ABNORMAL HIGH (ref 0.50–1.35)
GFR, EST AFRICAN AMERICAN: 35 mL/min — AB
GFR, Est Non African American: 31 mL/min — ABNORMAL LOW
GLUCOSE: 110 mg/dL — AB (ref 70–99)
Potassium: 4.4 mEq/L (ref 3.5–5.3)
Sodium: 139 mEq/L (ref 135–145)

## 2014-03-08 ENCOUNTER — Encounter: Payer: Self-pay | Admitting: Internal Medicine

## 2014-04-13 ENCOUNTER — Encounter: Payer: Self-pay | Admitting: Internal Medicine

## 2014-04-13 ENCOUNTER — Ambulatory Visit (INDEPENDENT_AMBULATORY_CARE_PROVIDER_SITE_OTHER): Payer: Medicare Other | Admitting: Internal Medicine

## 2014-04-13 VITALS — BP 150/74 | HR 80 | Temp 98.4°F | Resp 16 | Ht 68.0 in | Wt 235.0 lb

## 2014-04-13 DIAGNOSIS — Z1331 Encounter for screening for depression: Secondary | ICD-10-CM

## 2014-04-13 DIAGNOSIS — I10 Essential (primary) hypertension: Secondary | ICD-10-CM

## 2014-04-13 DIAGNOSIS — E782 Mixed hyperlipidemia: Secondary | ICD-10-CM

## 2014-04-13 DIAGNOSIS — Z1212 Encounter for screening for malignant neoplasm of rectum: Secondary | ICD-10-CM

## 2014-04-13 DIAGNOSIS — Z79899 Other long term (current) drug therapy: Secondary | ICD-10-CM

## 2014-04-13 DIAGNOSIS — Z789 Other specified health status: Secondary | ICD-10-CM

## 2014-04-13 DIAGNOSIS — E559 Vitamin D deficiency, unspecified: Secondary | ICD-10-CM

## 2014-04-13 DIAGNOSIS — R7303 Prediabetes: Secondary | ICD-10-CM

## 2014-04-13 DIAGNOSIS — Z0000 Encounter for general adult medical examination without abnormal findings: Secondary | ICD-10-CM

## 2014-04-13 DIAGNOSIS — Z125 Encounter for screening for malignant neoplasm of prostate: Secondary | ICD-10-CM

## 2014-04-13 LAB — CBC WITH DIFFERENTIAL/PLATELET
BASOS PCT: 1 % (ref 0–1)
Basophils Absolute: 0.1 10*3/uL (ref 0.0–0.1)
EOS ABS: 0.3 10*3/uL (ref 0.0–0.7)
EOS PCT: 5 % (ref 0–5)
HCT: 31.7 % — ABNORMAL LOW (ref 39.0–52.0)
Hemoglobin: 10.3 g/dL — ABNORMAL LOW (ref 13.0–17.0)
Lymphocytes Relative: 23 % (ref 12–46)
Lymphs Abs: 1.5 10*3/uL (ref 0.7–4.0)
MCH: 30.8 pg (ref 26.0–34.0)
MCHC: 32.5 g/dL (ref 30.0–36.0)
MCV: 94.9 fL (ref 78.0–100.0)
Monocytes Absolute: 0.6 10*3/uL (ref 0.1–1.0)
Monocytes Relative: 10 % (ref 3–12)
Neutro Abs: 3.9 10*3/uL (ref 1.7–7.7)
Neutrophils Relative %: 61 % (ref 43–77)
PLATELETS: 241 10*3/uL (ref 150–400)
RBC: 3.34 MIL/uL — ABNORMAL LOW (ref 4.22–5.81)
RDW: 14.7 % (ref 11.5–15.5)
WBC: 6.4 10*3/uL (ref 4.0–10.5)

## 2014-04-13 MED ORDER — TRAMADOL HCL 50 MG PO TABS: ORAL_TABLET | ORAL | Status: AC

## 2014-04-13 NOTE — Progress Notes (Signed)
Patient ID: [name], male   DOB: October 05, 1929, 80 y.o.   MRN: [id number]   Annual Screening Comprehensive Examination  This very nice 80 y.o.WWM presents for complete physical.  Patient has been followed for HTN, ASCAD/Stents, Morbid Obesity, Prediabetes, Hyperlipidemia, and Vitamin D Deficiency.   HTN predates since 1997. Patient's BP has been controlled at home.Today's BP: 150/74 mmHg. Patient underwent PTCA/Stents in 2009. Patient denies any cardiac symptoms as chest pain, palpitations, shortness of breath, dizziness or ankle swelling.   Patient's hyperlipidemia is controlled with diet and medications. Patient denies myalgias or other medication SE's. Last Lipids in Jan 2015 were at goal.  Lab Results  Component Value Date   CHOL 133 11/24/2013   HDL 40 11/24/2013   LDLCALC 78 11/24/2013   TRIG 74 11/24/2013   CHOLHDL 3.3 11/24/2013    Patient has prediabetes/insulin resistance since    and last A1c 6.2% in Jan 2015. Patient denies reactive hypoglycemic symptoms, visual blurring, diabetic polys or paresthesias.    Finally, patient has history of Vitamin D Deficiency of 24 in 2008  and last vitamin D wa s62 in Oct 2015.   Medication Sig  . allopurinol  300 MG tablet Take 150 mg by mouth daily.  Marland Kitchen amLODipine (NORVASC) 5 MG  Take 5 mg by mouth daily before breakfast.   . aspirin EC 81 MG tablet Take 81 mg by mouth daily.  . bumetanide  2 MG tablet Takes 1/2-1 pill daily for fluid  . VITAMIN D 1000 U Take 1 capsules by mouth 2  times daily.  . famotidine (PEPCID) 40 MG tablet Take 40 mg by mouth daily as needed. stomach  . FERROUS SULFATE PO Take 1 tablet by mouth daily.  Marland Kitchen levothyroxine  200 MCG tablet Take 100 mcg by mouth daily before breakfast.  . losartan  50 MG tablet Take 50 mg by mouth daily.  . Magnesium 250 MG TABS Take 500 mg by mouth daily.   [name] WITH MINERALS  Take 1 tablet by mouth daily.  . nitroGLYCERIN  0.4 MG SL tab Place 0.4 mg under the tongue every 5   minas needed  . pravastatin  40 MG tablet Take 1 tablet (40 mg total) by mouth at bedtime.  . vitamin C 500 MG tablet Take 500 mg by mouth 2 (two) times daily.   No Known Allergies  Past Medical History  Diagnosis Date  . Hard of hearing   . Coronary artery disease   . Hearing deficit     hearing aids- bilateral  . Anemia   . Arthritis     stenosis - back, neck  . Cancer     renal neoplasm  . GERD (gastroesophageal reflux disease)   . Hypertension     sees Dr. Wynonia Lawman, stress test recently for prep for surgery  . Chronic kidney disease     renalCa- nephrectomy [facility]) 04/2012  . Hypothyroidism   . Vitamin D deficiency   . Prediabetes    Past Surgical History  Procedure Laterality Date  . Joint replacement      bilateral knee replacements   . Cholecystectomy    . Tonsillectomy    . Appendectomy    . Eye surgery      right eye cataract surgery   . Laparoscopic nephrectomy  04/29/2012    Procedure: LAPAROSCOPIC NEPHRECTOMY;  Surgeon: Dutch Gray, MD;  Location: [facility] ORS;  Service: Urology;  Laterality: Left;      . Coronary angioplasty  stents , + 7-89yrs. ago  . Back surgery      lower back surgery - 1990's  . Anterior cervical decomp/discectomy fusion N/A 01/18/2013    Procedure: ANTERIOR CERVICAL DECOMPRESSION/DISCECTOMY FUSION 2 LEVELS;  Surgeon: Faythe Ghee, MD;  Location: [facility] OR[facility];  Service: Neurosurgery;  Laterality: N/A;   History - per his daughter he is very sedentary - essentially non- ambulatory. Sleeps til 11 am every day , then gets out of bed about noon , then stays up until about 2 am. Usually eats 2 meals /day - ie a bagel at [employer] about 2 pm and the eats only one meal daily about 7 pm - warming up foods pre-prepared by his daughter.   Social History  . Marital Status: Widowed    Spouse Name: N/A    Number of Children: N/A  . Years of Education: N/A   Occupational History  . Ret [employer] in 1999 after 40 yrs.   Social History Main Topics  .  Smoking status: Former Smoker    Types: Cigarettes    Quit date: 11/10/1970  . Smokeless tobacco: Never Used  . Alcohol Use: 6.6 oz/week    2 Cans of beer, 9 Shots of liquor per week     Comment: friday- beer & 3 drambuie  . Drug Use: No  . Sexual Activity: No     ROS Constitutional: Denies fever, chills, weight loss/gain, headaches, insomnia, fatigue, night sweats or change in appetite. Eyes: Denies redness, blurred vision, diplopia, discharge, itchy or watery eyes.  ENT: Denies discharge, congestion, post nasal drip, epistaxis, sore throat, earache, hearing loss, dental pain, Tinnitus, Vertigo, Sinus pain or snoring.  Cardio: Denies chest pain, palpitations, irregular heartbeat, syncope, dyspnea, diaphoresis, orthopnea, PND, claudication or edema Respiratory: denies cough, dyspnea, DOE, pleurisy, hoarseness, laryngitis or wheezing.  Gastrointestinal: Denies dysphagia, heartburn, reflux, water brash, pain, cramps, nausea, vomiting, bloating, diarrhea, constipation, hematemesis, melena, hematochezia, jaundice or hemorrhoids Genitourinary: Denies dysuria, frequency, urgency, nocturia, hesitancy, discharge, hematuria or flank pain Musculoskeletal: C/o B. Knee pains precluding ambulation. Skin: Denies puritis, rash, hives, warts, acne, eczema or change in skin lesion Neuro: No weakness, tremor, incoordination, spasms, paresthesia or pain Psychiatric: Denies confusion, memory loss or sensory loss Endocrine: Denies change in weight, skin, hair change, nocturia, and paresthesia, diabetic polys, visual blurring or hyper / hypo glycemic episodes.  Heme/Lymph: No excessive bleeding, bruising or enlarged lymph nodes.  Physical Exam  BP 150/74  P 80  T98.4 F   Resp 16  Ht $R'5\' 8"'YK$    Wt 235 lb   BMI 35.74 kg/m2  General Appearance: Over nourished, severely Obese and  in no apparent distress. Eyes: PERRLA, EOMs, conjunctiva no swelling or erythema, normal fundi and vessels. Sinuses: No  frontal/maxillary tenderness ENT/Mouth: EACs patent / TMs  nl. Nares clear without erythema, swelling, mucoid exudates. Oral hygiene is good. No erythema, swelling, or exudate. Tongue normal, non-obstructing. Tonsils not swollen or erythematous. Hearing normal.  Neck: Supple, thyroid normal. No bruits, nodes or JVD. Respiratory: Respiratory effort normal.  BS equal and clear bilateral without rales, rhonci, wheezing or stridor. Cardio: Heart sounds are normal with regular rate and rhythm and no murmurs, rubs or gallops. Peripheral pulses are normal and equal bilaterally without edema. No aortic or femoral bruits. Chest: symmetric with normal excursions and percussion.  Abdomen: Flat, soft, with bowl sounds. Nontender, no guarding, rebound, hernias, masses, or organomegaly.  Lymphatics: Non tender without lymphadenopathy.  Genitourinary: No hernias.Testes nl. DRE - deferred for age.  Musculoskeletal: Generalized severe decrease in muscle power, tone & bulk. Full ROM all peripheral extremities, joint stability, 5/5 strength, and in a wheelchair. Skin: Warm and dry without rashes, lesions, cyanosis, clubbing or  ecchymosis.  Neuro: Cranial nerves intact, reflexes equal bilaterally. Normal muscle tone, no cerebellar symptoms. Sensation intact.  Pysch: Awake and oriented X 3, normal affect, insight and judgment appropriate.   Assessment and Plan  1. Annual Screening Examination 2. Hypertension  3. Hyperlipidemia 4. Pre Diabetes 5. Vitamin D Deficiency 6. ASCAD/CABG 7. Hx/o Renal Cell Carcinoma , S/P catheter HFA (04/2012)   Continue prudent diet as discussed, weight control, BP monitoring, regular exercise, and medications as discussed.  Discussed med effects and SE's. Routine screening labs and tests as requested with regular follow-up as recommended.   Discussed with patient to take his tramadol 40 mg qid to hopefully lessen his chronic knee pains to allow him to increase his mobility.

## 2014-04-13 NOTE — Patient Instructions (Signed)

## 2014-04-14 LAB — URINALYSIS, MICROSCOPIC ONLY
BACTERIA UA: NONE SEEN
Casts: NONE SEEN
Crystals: NONE SEEN
Squamous Epithelial / LPF: NONE SEEN

## 2014-04-14 LAB — BASIC METABOLIC PANEL WITH GFR
BUN: 41 mg/dL — ABNORMAL HIGH (ref 6–23)
CALCIUM: 9.1 mg/dL (ref 8.4–10.5)
CO2: 24 mEq/L (ref 19–32)
CREATININE: 1.86 mg/dL — AB (ref 0.50–1.35)
Chloride: 104 mEq/L (ref 96–112)
GFR, EST AFRICAN AMERICAN: 38 mL/min — AB
GFR, Est Non African American: 33 mL/min — ABNORMAL LOW
GLUCOSE: 131 mg/dL — AB (ref 70–99)
Potassium: 4.3 mEq/L (ref 3.5–5.3)
SODIUM: 136 meq/L (ref 135–145)

## 2014-04-14 LAB — MICROALBUMIN / CREATININE URINE RATIO
CREATININE, URINE: 99.8 mg/dL
Microalb Creat Ratio: 32.5 mg/g — ABNORMAL HIGH (ref 0.0–30.0)
Microalb, Ur: 3.24 mg/dL — ABNORMAL HIGH (ref 0.00–1.89)

## 2014-04-14 LAB — LIPID PANEL
CHOL/HDL RATIO: 3.4 ratio
CHOLESTEROL: 136 mg/dL (ref 0–200)
HDL: 40 mg/dL (ref >39)
LDL Cholesterol: 82 mg/dL (ref 0–99)
Triglycerides: 68 mg/dL (ref <150)
VLDL: 14 mg/dL (ref 0–40)

## 2014-04-14 LAB — HEMOGLOBIN A1C
HEMOGLOBIN A1C: 5.7 % — AB (ref <5.7)
Mean Plasma Glucose: 117 mg/dL — ABNORMAL HIGH (ref <117)

## 2014-04-14 LAB — HEPATIC FUNCTION PANEL
ALT: 9 U/L (ref 0–53)
AST: 13 U/L (ref 0–37)
Albumin: 3.6 g/dL (ref 3.5–5.2)
Alkaline Phosphatase: 83 U/L (ref 39–117)
BILIRUBIN DIRECT: 0.1 mg/dL (ref 0.0–0.3)
Indirect Bilirubin: 0.3 mg/dL (ref 0.2–1.2)
Total Bilirubin: 0.4 mg/dL (ref 0.2–1.2)
Total Protein: 6.8 g/dL (ref 6.0–8.3)

## 2014-04-14 LAB — VITAMIN D 25 HYDROXY (VIT D DEFICIENCY, FRACTURES): VIT D 25 HYDROXY: 76 ng/mL (ref 30–89)

## 2014-04-14 LAB — MAGNESIUM: MAGNESIUM: 2.3 mg/dL (ref 1.5–2.5)

## 2014-04-14 LAB — INSULIN, FASTING: INSULIN FASTING, SERUM: 71 u[IU]/mL — AB (ref 3–28)

## 2014-04-14 LAB — TSH: TSH: 1.602 u[IU]/mL (ref 0.350–4.500)

## 2014-04-14 LAB — PSA: PSA: 3.56 ng/mL (ref <=4.00)

## 2014-04-17 ENCOUNTER — Encounter: Payer: Self-pay | Admitting: *Deleted

## 2014-04-18 ENCOUNTER — Encounter: Payer: Self-pay | Admitting: Cardiology

## 2014-04-18 DIAGNOSIS — E669 Obesity, unspecified: Secondary | ICD-10-CM

## 2014-04-18 DIAGNOSIS — I2510 Atherosclerotic heart disease of native coronary artery without angina pectoris: Secondary | ICD-10-CM | POA: Insufficient documentation

## 2014-04-18 DIAGNOSIS — M4800 Spinal stenosis, site unspecified: Secondary | ICD-10-CM | POA: Insufficient documentation

## 2014-04-18 NOTE — Progress Notes (Unsigned)
Patient ID: [name], male   DOB: 1928/12/24, 80 y.o.   MRN: [id number]   [name], Rajewski  Date of visit:  04/18/2014 DOB:  05-Nov-1929    Age:  80 yrs. Medical record number:  [id number]     Account number:  [id number] Primary Care Provider: Unk Pinto D ____________________________ CURRENT DIAGNOSES  1. CAD,Native  2. Hyperlipidemia, unspecified  3. Stent Placement  4. Hypertension,Essential (Benign)  5. Obesity(BMI30-40)  6. Hypothyroidism  7. Diabetes Mellitus-NIDD ____________________________ ALLERGIES  No Known Allergies ____________________________ MEDICATIONS  1. pravastatin 40 mg tablet, 1 p.o. daily  2. allopurinol 300 mg tablet, 1/2 tab daily  3. famotidine 40 mg Tablet, PRN  4. bumetanide 2 mg tablet, 1/2 tab daily  5. multivitamin tablet, 1 p.o. daily  6. Vitamin D3 2,000 unit capsule, 2 qd  7. magnesium 250 mg tablet, 500 mg qd  8. iron 325 mg (65 mg iron) tablet, 3 p.o. daily  9. aspirin 81 mg tablet,chewable, 1 p.o. daily  10. amlodipine 5 mg tablet, 1 p.o. daily  11. losartan 50 mg tablet, 1 p.o. daily  12. tramadol 50 mg tablet, PRN  13. levothyroxine 200 mcg tablet, 1 p.o. daily  14. nitroglycerin 0.4 mg sublingual tablet, Take as directed ____________________________ CHIEF COMPLAINTS  Followup of CAD,Native  Followup of Hyperlipidemia, unspecified  Followup of Hypertension,Essential (Benign) ____________________________ HISTORY OF PRESENT ILLNESS Patient seen for cardiac followup. He has become limited with severe pain in his legs to the point that he really has to just go around in a wheelchair now. He is unable to do much in the way of significant walking. He denies angina and has no PND, orthopnea or claudication. He is quite sedentary and inactive. Although he is obese he has been able to lose some weight. ____________________________ PAST HISTORY  Past Medical Illnesses:  hypertension, TMJ, DM-non-insulin dependent, GERD, hypothyroidism, Bells  Palsy 2001, obesity, BPH, spinal stenosis;  Cardiovascular Illnesses:  CAD, ventricular tachycardia;  Surgical Procedures:  appendectomy, cataract extraction OS, laminectomy lumbar, laparoscopic cholecystectomy, tonsillectomy, circumcision, knee replacement-bil, laminectomy cervical;  NYHA Classification:  II;  Cardiology Procedures-Invasive:  cardiac cath (left) July 2009, stent  ostial RCA August 2009 Dr. Olevia Perches;  Cardiology Procedures-Noninvasive:  treadmill, adenosine cardiolite May 2009;  Cardiac Cath Results:  calcified Left main, 30% stenosis Left main, calcified LAD, 30% stenosis LAD, diffusely diseased LAD, calcified CFX, scattered irregularities CFX, calcified ostial RCA, 95% stenosis ostial RCA;  LVEF of 64% documented via nuclear study on 12/16/2012,   ____________________________ CARDIO-PULMONARY TEST DATES EKG Date:  04/22/2012;   Cardiac Cath Date:  06/06/2008;  Stent Placement Date: 06/12/2008;  Holter/Event Monitor Date: 07/27/2008;  Nuclear Study Date:  04/05/2008;  Chest Xray Date: 06/02/2008;   ____________________________ FAMILY HISTORY Brother -- Brother dead, Cancer, Deceased Father -- Father dead, Gastrointestinal Disorder, Deceased Mother -- Mother dead, Hypertension, Deceased Sister -- Sister dead, Unknown Disease, Deceased ____________________________ SOCIAL HISTORY Alcohol Use:  mixed drinks 1 per day;  Smoking:  used to smoke but quit Prior to 1980;  Diet:  regular diet;  Lifestyle:  widower and 1 daughter;  Exercise:  exercise is limited due to physical disability;  Occupation:  retired AT;  Residence:  lives alone;   ____________________________ REVIEW OF SYSTEMS General:  obesity, weight loss of approximately 10 lbs Ears, Nose, Throat, Mouth:  partial hearing loss Respiratory: mild dyspnea with exertion Cardiovascular:  please review HPI Abdominal: denies dyspepsia, GI bleeding, constipation, or diarrhea Genitourinary-Male: nocturia  Musculoskeletal:  arthritis of the  knees, chronic low back pain Neurological:  unsteady gait  ____________________________ PHYSICAL EXAMINATION VITAL SIGNS  Blood Pressure:  132/68 Sitting, Left arm, regular cuff  , 144/70 Standing, Left arm and regular cuff   Pulse:  88/min. Weight:  235.00 lbs. Height:  70"BMI: 33  Constitutional:  pleasant white male in no acute distress, moderately obese in wheelchair Skin:  multiple sebaceous cysts, scattered ecchymosis present Head:  normocephalic, normal hair pattern, no masses or tenderness ENT:  hard of hearing, ears, nose and throat unremarkable, bilateral hearing aides present Neck:  supple, without massess. No JVD, thyromegaly or carotid bruits. Carotid upstroke normal. Chest:  normal symmetry, clear to auscultation and percussion. Cardiac:  regular rhythm, normal S1 and S2, No S3 or S4, no murmurs, gallops or rubs detected. Peripheral Pulses:  femoral pulses 2+, dorsalis pedis and posterior tibial pulses diminished Extremities & Back:  scar from knee surgery bilaterally, bilateral venous insufficiency changes present, 1+ edema Neurological:  unsteady gait ____________________________ MOST RECENT LIPID PANEL 03/02/13  CHOL TOTL 148 mg/dl, LDL 91 calc, HDL 35 mg/dl, TRIGLYCER 108 mg/dl and CHOL/HDL 4.2 (Calc) ____________________________ IMPRESSIONS/PLAN  1. Coronary artery disease with previous stenting of the right coronary artery with no angina 2. Hypertension controlled 3. Obesity with need to lose weight 4. Hyperlipidemia  Recommendations:  His major limitations are related to his weight and his spinal stenosis. He is asymptomatic cardiac-wise. Followup in 6 months. ____________________________ TODAYS ORDERS  1. Return Visit: 6 months  2. 12 Lead EKG: 6 months                       ____________________________ Cardiology Physician:  Kerry Hough MD [facility]

## 2014-04-19 ENCOUNTER — Encounter: Payer: Self-pay | Admitting: Internal Medicine

## 2014-04-20 ENCOUNTER — Other Ambulatory Visit: Payer: Self-pay | Admitting: Internal Medicine

## 2014-04-20 MED ORDER — CIPROFLOXACIN HCL 500 MG PO TABS: 500.0000 mg | ORAL_TABLET | Freq: Two times a day (BID) | ORAL | Status: AC

## 2014-05-30 ENCOUNTER — Encounter: Payer: Self-pay | Admitting: Internal Medicine

## 2014-05-30 MED ORDER — PRAVASTATIN SODIUM 40 MG PO TABS: 40.0000 mg | ORAL_TABLET | Freq: Every day | ORAL | Status: DC

## 2014-05-31 ENCOUNTER — Other Ambulatory Visit: Payer: Self-pay | Admitting: Physician Assistant

## 2014-05-31 MED ORDER — LEVOTHYROXINE SODIUM 200 MCG PO TABS: 100.0000 ug | ORAL_TABLET | Freq: Every day | ORAL | Status: DC

## 2014-05-31 MED ORDER — PRAVASTATIN SODIUM 40 MG PO TABS: 40.0000 mg | ORAL_TABLET | Freq: Every day | ORAL | Status: DC

## 2014-07-20 ENCOUNTER — Ambulatory Visit (INDEPENDENT_AMBULATORY_CARE_PROVIDER_SITE_OTHER): Payer: Medicare Other | Admitting: Physician Assistant

## 2014-07-20 ENCOUNTER — Encounter: Payer: Self-pay | Admitting: Physician Assistant

## 2014-07-20 VITALS — BP 138/68 | HR 76 | Temp 98.6°F | Resp 16 | Ht 68.0 in | Wt 232.0 lb

## 2014-07-20 DIAGNOSIS — R7303 Prediabetes: Secondary | ICD-10-CM

## 2014-07-20 DIAGNOSIS — M109 Gout, unspecified: Secondary | ICD-10-CM

## 2014-07-20 DIAGNOSIS — M4800 Spinal stenosis, site unspecified: Secondary | ICD-10-CM

## 2014-07-20 DIAGNOSIS — E039 Hypothyroidism, unspecified: Secondary | ICD-10-CM

## 2014-07-20 DIAGNOSIS — D649 Anemia, unspecified: Secondary | ICD-10-CM

## 2014-07-20 DIAGNOSIS — M159 Polyosteoarthritis, unspecified: Secondary | ICD-10-CM

## 2014-07-20 DIAGNOSIS — E669 Obesity, unspecified: Secondary | ICD-10-CM

## 2014-07-20 DIAGNOSIS — K219 Gastro-esophageal reflux disease without esophagitis: Secondary | ICD-10-CM

## 2014-07-20 DIAGNOSIS — C649 Malignant neoplasm of unspecified kidney, except renal pelvis: Secondary | ICD-10-CM

## 2014-07-20 DIAGNOSIS — C642 Malignant neoplasm of left kidney, except renal pelvis: Secondary | ICD-10-CM

## 2014-07-20 DIAGNOSIS — Z79899 Other long term (current) drug therapy: Secondary | ICD-10-CM

## 2014-07-20 DIAGNOSIS — Z9181 History of falling: Secondary | ICD-10-CM

## 2014-07-20 DIAGNOSIS — Z23 Encounter for immunization: Secondary | ICD-10-CM

## 2014-07-20 DIAGNOSIS — N184 Chronic kidney disease, stage 4 (severe): Secondary | ICD-10-CM

## 2014-07-20 DIAGNOSIS — Z0000 Encounter for general adult medical examination without abnormal findings: Secondary | ICD-10-CM

## 2014-07-20 DIAGNOSIS — R7309 Other abnormal glucose: Secondary | ICD-10-CM

## 2014-07-20 DIAGNOSIS — Z1331 Encounter for screening for depression: Secondary | ICD-10-CM

## 2014-07-20 DIAGNOSIS — I10 Essential (primary) hypertension: Secondary | ICD-10-CM

## 2014-07-20 DIAGNOSIS — E782 Mixed hyperlipidemia: Secondary | ICD-10-CM

## 2014-07-20 DIAGNOSIS — E559 Vitamin D deficiency, unspecified: Secondary | ICD-10-CM

## 2014-07-20 DIAGNOSIS — I2510 Atherosclerotic heart disease of native coronary artery without angina pectoris: Secondary | ICD-10-CM

## 2014-07-20 LAB — CBC WITH DIFFERENTIAL/PLATELET
BASOS ABS: 0.1 10*3/uL (ref 0.0–0.1)
Basophils Relative: 1 % (ref 0–1)
Eosinophils Absolute: 0.2 10*3/uL (ref 0.0–0.7)
Eosinophils Relative: 3 % (ref 0–5)
HEMATOCRIT: 31.5 % — AB (ref 39.0–52.0)
Hemoglobin: 9.9 g/dL — ABNORMAL LOW (ref 13.0–17.0)
Lymphocytes Relative: 26 % (ref 12–46)
Lymphs Abs: 1.8 10*3/uL (ref 0.7–4.0)
MCH: 30.6 pg (ref 26.0–34.0)
MCHC: 31.4 g/dL (ref 30.0–36.0)
MCV: 97.2 fL (ref 78.0–100.0)
MONO ABS: 0.8 10*3/uL (ref 0.1–1.0)
Monocytes Relative: 11 % (ref 3–12)
NEUTROS ABS: 4.1 10*3/uL (ref 1.7–7.7)
Neutrophils Relative %: 59 % (ref 43–77)
Platelets: 239 10*3/uL (ref 150–400)
RBC: 3.24 MIL/uL — ABNORMAL LOW (ref 4.22–5.81)
RDW: 14.8 % (ref 11.5–15.5)
WBC: 7 10*3/uL (ref 4.0–10.5)

## 2014-07-20 MED ORDER — NYSTATIN 100000 UNIT/GM EX CREA: TOPICAL_CREAM | CUTANEOUS | Status: DC

## 2014-07-20 MED ORDER — CLOTRIMAZOLE-BETAMETHASONE 1-0.05 % EX CREA: TOPICAL_CREAM | CUTANEOUS | Status: DC

## 2014-07-20 NOTE — Progress Notes (Signed)
MEDICARE ANNUAL WELLNESS VISIT AND FOLLOW UP Assessment:   1. Coronary artery disease involving native coronary artery of native heart without angina pectoris Continue follow up Dr. Wynonia Lawman, he has NTG but does not use likely due to inactivity.   2. Essential hypertension - continue medications, DASH diet, exercise and monitor at home. Call if greater than 130/80.  - CBC with Differential - BASIC METABOLIC PANEL WITH GFR - Hepatic function panel  3. Gastroesophageal reflux disease without esophagitis controlled  4. Hypothyroidism, unspecified hypothyroidism type - TSH  5. Prediabetes Discussed general issues about diabetes pathophysiology and management., Educational material distributed., Suggested low cholesterol diet., Encouraged aerobic exercise., Discussed foot care., Reminded to get yearly retinal exam. - Hemoglobin A1c - HM DIABETES FOOT EXAM  6. DJD Long discussion- patient is unable to ambulate/move due to pain- he is on tramadol, we discussed switching to norco or sending to pain management Declines PT  7. Cancer of kidney, left Continue follow up - BASIC METABOLIC PANEL WITH GFR  8. Chronic kidney disease, stage 4 (severe) Continue follow up  - BASIC METABOLIC PANEL WITH GFR  9. Anemia, unspecified anemia type Likely due to CKD - CBC with Differential  10. Encounter for long-term (current) use of other medications - Magnesium  11. Hyperlipidemia -continue medications, check lipids, decrease fatty foods, increase activity. ; - Lipid panel  12. Obesity (BMI 30-39.9) Obesity with co morbidities- long discussion about weight loss, diet, and exercise  13. Spinal stenosis, unspecified spinal region ? Need follow up ortho/pain management  14. Vitamin D deficiency - Vit D  25 hydroxy (rtn osteoporosis monitoring)  15. Gout without tophus, unspecified cause, unspecified chronicity, unspecified site - Uric acid  16. Need for prophylactic vaccination and  inoculation against influenza Prevnar next visit - Flu vaccine HIGH DOSE PF  17. Pressure ulcer/yeast gluteal fold Medication sent in, movement increased  18. Tinea Pedis Nystatin cream sent in   Plan:   During the course of the visit the patient was educated and counseled about appropriate screening and preventive services including:    Pneumococcal vaccine   Influenza vaccine  Td vaccine  Screening electrocardiogram  Colorectal cancer screening  Diabetes screening  Glaucoma screening  Nutrition counseling   Screening recommendations, referrals: Vaccinations: Tdap vaccine not indicated Influenza vaccine ordered Pneumococcal vaccine prevnar next visit Shingles vaccine not indicated Hep B vaccine not indicated  Nutrition assessed and recommended  Colonoscopy declined Recommended yearly ophthalmology/optometry visit for glaucoma screening and checkup Recommended yearly dental visit for hygiene and checkup Advanced directives - patient has it  Conditions/risks identified: BMI: Discussed weight loss, diet, and increase physical activity.  Increase physical activity: AHA recommends 150 minutes of physical activity a week.  Medications reviewed Diabetes is at goal, ACE/ARB therapy: Yes. Urinary Incontinence is not an issue: discussed non pharmacology and pharmacology options.  Fall risk: high- discussed PT, home fall assessment, medications.    Subjective:  [name] is a 80 y.o. male who presents for Medicare Annual Wellness Visit and 3 month follow up for HTN, hyperlipidemia, prediabetes, and vitamin D Def.  Date of last medicare wellness visit was is unknown.  His blood pressure has been controlled at home, today their BP is BP: 138/68 mmHg He does not workout. He denies chest pain, shortness of breath, dizziness.  He is on cholesterol medication and denies myalgias. His cholesterol is at goal. The cholesterol last visit was:   Lab Results  Component  Value Date   CHOL 136  04/13/2014   HDL 40 04/13/2014   LDLCALC 82 04/13/2014   TRIG 68 04/13/2014   CHOLHDL 3.4 04/13/2014   He has been working on diet and exercise for prediabetes, and denies polydipsia and polyuria. Last A1C in the office was:  Lab Results  Component Value Date   HGBA1C 5.7* 04/13/2014   Patient is on Vitamin D supplement.   Lab Results  Component Value Date   VD25OH 76 04/13/2014     Patient is on allopurinol for gout and does not report a recent flare.  He is on thyroid medication. His medication was not changed last visit. Patient denies nervousness, palpitations and weight changes.  Lab Results  Component Value Date   TSH 1.602 04/13/2014  .  He is in a wheelchair due to severe pain in his legs and is very inactive, does not use NTG due to this.     Names of Other Physician/Practitioners you currently use: 1. [facility] here for primary care 2. No dentist, has dentures.  Patient Care Team: Unk Pinto, MD as PCP - General (Internal Medicine) Jacolyn Reedy, MD as Consulting Physician (Cardiology) Rob Hickman, MD as Consulting Physician (Ophthalmology)- 1 year due Missy Sabins, MD as Consulting Physician (Gastroenterology) Ninetta Lights, MD as Consulting Physician (Orthopedic Surgery)  Medication Review: Current Outpatient Prescriptions on File Prior to Visit  Medication Sig Dispense Refill  . allopurinol (ZYLOPRIM) 300 MG tablet Take 150 mg by mouth daily.      Marland Kitchen amLODipine (NORVASC) 5 MG tablet Take 5 mg by mouth daily before breakfast.       . aspirin EC 81 MG tablet Take 81 mg by mouth daily.      . bumetanide (BUMEX) 2 MG tablet Takes 1/2-1 pill daily for fluid  90 tablet  1  . Cholecalciferol (VITAMIN D3) 1000 UNITS CAPS Take 1,000 capsules by mouth 2 (two) times daily.      . famotidine (PEPCID) 40 MG tablet Take 40 mg by mouth daily as needed. stomach      . FERROUS SULFATE PO Take 1 tablet by mouth  daily.      Marland Kitchen levothyroxine (SYNTHROID, LEVOTHROID) 200 MCG tablet Take 0.5 tablets (100 mcg total) by mouth daily before breakfast.  90 tablet  1  . losartan (COZAAR) 50 MG tablet Take 50 mg by mouth daily.      . Magnesium 250 MG TABS Take 500 mg by mouth daily.       . Multiple Vitamin (MULITIVITAMIN WITH MINERALS) TABS Take 1 tablet by mouth daily.      . nitroGLYCERIN (NITROSTAT) 0.4 MG SL tablet Place 0.4 mg under the tongue every 5 (five) minutes as needed for chest pain.       . pravastatin (PRAVACHOL) 40 MG tablet Take 1 tablet (40 mg total) by mouth at bedtime.  90 tablet  3  . traMADol (ULTRAM) 50 MG tablet Take 1 tablet every 4 hour --- 4 x day at 11 am  -  3 pm  -  7 pm  And 11 pm to help control pain  120 tablet  5  . vitamin C (ASCORBIC ACID) 500 MG tablet Take 500 mg by mouth 2 (two) times daily.       No current facility-administered medications on file prior to visit.    Current Problems (verified) Patient Active Problem List   Diagnosis Date Noted  . CAD (coronary artery disease), native coronary artery   .  Obesity (BMI 30-39.9)   . Spinal stenosis   . Encounter for long-term (current) use of other medications 01/23/2014  . Hyperlipidemia 11/24/2013  . Anemia   . DJD   . Cancer of kidney   . GERD (gastroesophageal reflux disease)   . Hypertension   . Chronic kidney disease   . Hypothyroidism   . Vitamin D deficiency   . Prediabetes     Screening Tests Health Maintenance  Topic Date Due  . Colonoscopy  12/20/1980  . Influenza Vaccine  06/10/2014  . Tetanus/tdap  02/17/2022  . Pneumococcal Polysaccharide Vaccine Age 51 And Over  Completed  . Zostavax  Completed    Immunization History  Administered Date(s) Administered  . Influenza Split 09/06/2013  . Pneumococcal Polysaccharide-23 02/18/2012  . Td 02/18/2012  . Zoster 03/03/2013    Preventative care: Last colonoscopy: 2010 due 10/2014 Cardiac Cath Date: 06/06/2008;  Stent Placement Date:  06/12/2008;  Holter/Event Monitor Date: 07/27/2008;  Nuclear Study Date: 04/05/2008;  Chest Xray Date: 06/02/2008   Prior vaccinations: TD or Tdap: 2013  Influenza: 2014 DUE  Pneumococcal: 2013 Prevnar 13: DUE  Shingles/Zostavax: 2014  History reviewed: allergies, current medications, past family history, past medical history, past social history, past surgical history and problem list   Risk Factors: Tobacco History  Substance Use Topics  . Smoking status: Former Smoker    Types: Cigarettes    Quit date: 11/10/1970  . Smokeless tobacco: Never Used  . Alcohol Use: 6.6 oz/week    2 Cans of beer, 9 Shots of liquor per week     Comment: friday- beer & 3 drambuie   He does not smoke.  Patient is a former smoker. Are there smokers in your home (other than you)?  No  Alcohol Current alcohol use: social drinker  Caffeine Current caffeine use: coffee 2 /day  Exercise Current exercise: none  Nutrition/Diet Current diet: in general, an "unhealthy" diet  Cardiac risk factors: advanced age (older than 38 for men, 7 for women), diabetes mellitus, dyslipidemia, family history of premature cardiovascular disease, hypertension, male gender, obesity (BMI >= 30 kg/m2) and sedentary lifestyle.  Depression Screen (Note: if answer to either of the following is "Yes", a more complete depression screening is indicated)   Q1: Over the past two weeks, have you felt down, depressed or hopeless? No  Q2: Over the past two weeks, have you felt little interest or pleasure in doing things? No  Have you lost interest or pleasure in daily life? No  Do you often feel hopeless? No  Do you cry easily over simple problems? No  Activities of Daily Living In your present state of health, do you have any difficulty performing the following activities?:  Driving? No- patient is still driving Managing money?  No Feeding yourself? No Getting from bed to chair? No Climbing a flight of stairs?  Yes Preparing food and eating?: Yes Bathing or showering? Yes, his daughter comes over and helps Getting dressed: No Getting to the toilet? Yes he uses a bottle at home Using the toilet:No Moving around from place to place: Yes In the past year have you fallen or had a near fall?:No   Are you sexually active?  No  Do you have more than one partner?  No  Vision Difficulties: Yes  Hearing Difficulties: Yes Do you often ask people to speak up or repeat themselves? Yes Do you experience ringing or noises in your ears? No Do you have difficulty understanding soft or whispered voices?  Yes  Cognition  Do you feel that you have a problem with memory?Yes  Do you often misplace items? No  Do you feel safe at home?  Yes Lives by himself  Advanced directives Does patient have a Jessamine? Yes Does patient have a Living Will? Yes   Objective:   Blood pressure 138/68, pulse 76, temperature 98.6 F (37 C), resp. rate 16, height 5\' 8"  (1.727 m), weight 232 lb (105.235 kg). Body mass index is 35.28 kg/(m^2).  General appearance: alert, no distress, WD/WN, male Cognitive Testing  Alert? Yes  Normal Appearance?Yes  Oriented to person? Yes  Place? Yes   Time? Yes  Recall of three objects?  Yes  Can perform simple calculations? Yes  Displays appropriate judgment?Yes  Can read the correct time from a watch face?Yes  HEENT: normocephalic, sclerae anicteric, TMs pearly, nares patent, no discharge or erythema, pharynx normal Oral cavity: MMM, no lesions Neck: supple, no lymphadenopathy, no thyromegaly, no masses Heart: RRR, normal S1, S2, no murmurs Lungs: CTA bilaterally, no wheezes, rhonchi, or rales Abdomen: +bs, soft, obese non tender, non distended, no masses, no hepatomegaly, no splenomegaly Musculoskeletal: patient has swelling bilateral knees, with tenderness.  Extremities: 1-2+  edema, no cyanosis, no clubbing Pulses: 2+ symmetric, upper and absent DP and  decrease TP Neurological: alert, oriented x 3, CN2-12 intact, strength normal upper extremities and decreased 3/5 lower extremities, sensation normal throughout, DTRs 2+ throughout, no cerebellar signs, patient in wheel chair Skin: complains of a pressure ulcer on his gluteal area for 3 months, erythema, and stage 1 ulcer/yeast in gluteal fold.  Psychiatric: normal affect, behavior normal, pleasant   Medicare Attestation I have personally reviewed: The patient's medical and social history Their use of alcohol, tobacco or illicit drugs Their current medications and supplements The patient's functional ability including ADLs,fall risks, home safety risks, cognitive, and hearing and visual impairment Diet and physical activities Evidence for depression or mood disorders  The patient's weight, height, BMI, and visual acuity have been recorded in the chart.  I have made referrals, counseling, and provided education to the patient based on review of the above and I have provided the patient with a written personalized care plan for preventive services.     Vicie Mutters, PA-C   07/20/2014

## 2014-07-20 NOTE — Patient Instructions (Signed)
Preventative Care for Adults, Male       REGULAR HEALTH EXAMS:  A routine yearly physical is a good way to check in with your primary care provider about your health and preventive screening. It is also an opportunity to share updates about your health and any concerns you have, and receive a thorough all-over exam.   Most health insurance companies pay for at least some preventative services.  Check with your health plan for specific coverages.  WHAT PREVENTATIVE SERVICES DO MEN NEED?  Adult men should have their weight and blood pressure checked regularly.   Men age 35 and older should have their cholesterol levels checked regularly.  Beginning at age 50 and continuing to age 75, men should be screened for colorectal cancer.  Certain people should may need continued testing until age 85.  Other cancer screening may include exams for testicular and prostate cancer.  Updating vaccinations is part of preventative care.  Vaccinations help protect against diseases such as the flu.  Lab tests are generally done as part of preventative care to screen for anemia and blood disorders, to screen for problems with the kidneys and liver, to screen for bladder problems, to check blood sugar, and to check your cholesterol level.  Preventative services generally include counseling about diet, exercise, avoiding tobacco, drugs, excessive alcohol consumption, and sexually transmitted infections.    GENERAL RECOMMENDATIONS FOR GOOD HEALTH:  Healthy diet:  Eat a variety of foods, including fruit, vegetables, animal or vegetable protein, such as meat, fish, chicken, and eggs, or beans, lentils, tofu, and grains, such as rice.  Drink plenty of water daily.  Decrease saturated fat in the diet, avoid lots of red meat, processed foods, sweets, fast foods, and fried foods.  Exercise:  Aerobic exercise helps maintain good heart health. At least 30-40 minutes of moderate-intensity exercise is recommended.  For example, a brisk walk that increases your heart rate and breathing. This should be done on most days of the week.   Find a type of exercise or a variety of exercises that you enjoy so that it becomes a part of your daily life.  Examples are running, walking, swimming, water aerobics, and biking.  For motivation and support, explore group exercise such as aerobic class, spin class, Zumba, Yoga,or  martial arts, etc.    Set exercise goals for yourself, such as a certain weight goal, walk or run in a race such as a 5k walk/run.  Speak to your primary care provider about exercise goals.  Disease prevention:  If you smoke or chew tobacco, find out from your caregiver how to quit. It can literally save your life, no matter how long you have been a tobacco user. If you do not use tobacco, never begin.   Maintain a healthy diet and normal weight. Increased weight leads to problems with blood pressure and diabetes.   The Body Mass Index or BMI is a way of measuring how much of your body is fat. Having a BMI above 27 increases the risk of heart disease, diabetes, hypertension, stroke and other problems related to obesity. Your caregiver can help determine your BMI and based on it develop an exercise and dietary program to help you achieve or maintain this important measurement at a healthful level.  High blood pressure causes heart and blood vessel problems.  Persistent high blood pressure should be treated with medicine if weight loss and exercise do not work.   Fat and cholesterol leaves deposits in your arteries   that can block them. This causes heart disease and vessel disease elsewhere in your body.  If your cholesterol is found to be high, or if you have heart disease or certain other medical conditions, then you may need to have your cholesterol monitored frequently and be treated with medication.   Ask if you should have a stress test if your history suggests this. A stress test is a test done on  a treadmill that looks for heart disease. This test can find disease prior to there being a problem.  Avoid drinking alcohol in excess (more than two drinks per day).  Avoid use of street drugs. Do not share needles with anyone. Ask for professional help if you need assistance or instructions on stopping the use of alcohol, cigarettes, and/or drugs.  Brush your teeth twice a day with fluoride toothpaste, and floss once a day. Good oral hygiene prevents tooth decay and gum disease. The problems can be painful, unattractive, and can cause other health problems. Visit your dentist for a routine oral and dental check up and preventive care every 6-12 months.   Look at your skin regularly.  Use a mirror to look at your back. Notify your caregivers of changes in moles, especially if there are changes in shapes, colors, a size larger than a pencil eraser, an irregular border, or development of new moles.  Safety:  Use seatbelts 100% of the time, whether driving or as a passenger.  Use safety devices such as hearing protection if you work in environments with loud noise or significant background noise.  Use safety glasses when doing any work that could send debris in to the eyes.  Use a helmet if you ride a bike or motorcycle.  Use appropriate safety gear for contact sports.  Talk to your caregiver about gun safety.  Use sunscreen with a SPF (or skin protection factor) of 15 or greater.  Lighter skinned people are at a greater risk of skin cancer. Don't forget to also wear sunglasses in order to protect your eyes from too much damaging sunlight. Damaging sunlight can accelerate cataract formation.   Practice safe sex. Use condoms. Condoms are used for birth control and to help reduce the spread of sexually transmitted infections (or STIs).  Some of the STIs are gonorrhea (the clap), chlamydia, syphilis, trichomonas, herpes, HPV (human papilloma virus) and HIV (human immunodeficiency virus) which causes AIDS.  The herpes, HIV and HPV are viral illnesses that have no cure. These can result in disability, cancer and death.   Keep carbon monoxide and smoke detectors in your home functioning at all times. Change the batteries every 6 months or use a model that plugs into the wall.   Vaccinations:  Stay up to date with your tetanus shots and other required immunizations. You should have a booster for tetanus every 10 years. Be sure to get your flu shot every year, since 5%-20% of the [place] population comes down with the flu. The flu vaccine changes each year, so being vaccinated once is not enough. Get your shot in the fall, before the flu season peaks.   Other vaccines to consider:  Pneumococcal vaccine to protect against certain types of pneumonia.  This is normally recommended for adults age 65 or older.  However, adults younger than 78 years old with certain underlying conditions such as diabetes, heart or lung disease should also receive the vaccine.  Shingles vaccine to protect against Varicella Zoster if you are older than age 60, or younger   than 78 years old with certain underlying illness.  Hepatitis A vaccine to protect against a form of infection of the liver by a virus acquired from food.  Hepatitis B vaccine to protect against a form of infection of the liver by a virus acquired from blood or body fluids, particularly if you work in health care.  If you plan to travel internationally, check with your local health department for specific vaccination recommendations.  Cancer Screening:  Most routine colon cancer screening begins at the age of 50. On a yearly basis, doctors may provide special easy to use take-home tests to check for hidden blood in the stool. Sigmoidoscopy or colonoscopy can detect the earliest forms of colon cancer and is life saving. These tests use a small camera at the end of a tube to directly examine the colon. Speak to your caregiver about this at age 50, when routine  screening begins (and is repeated every 5 years unless early forms of pre-cancerous polyps or small growths are found).   At the age of 50 men usually start screening for prostate cancer every year. Screening may begin at a younger age for those with higher risk. Those at higher risk include African-Americans or having a family history of prostate cancer. There are two types of tests for prostate cancer:   Prostate-specific antigen (PSA) testing. Recent studies raise questions about prostate cancer using PSA and you should discuss this with your caregiver.   Digital rectal exam (in which your doctor's lubricated and gloved finger feels for enlargement of the prostate through the anus).   Screening for testicular cancer.  Do a monthly exam of your testicles. Gently roll each testicle between your thumb and fingers, feeling for any abnormal lumps. The best time to do this is after a hot shower or bath when the tissues are looser. Notify your caregivers of any lumps, tenderness or changes in size or shape immediately.     

## 2014-07-21 LAB — BASIC METABOLIC PANEL WITH GFR
BUN: 37 mg/dL — ABNORMAL HIGH (ref 6–23)
CO2: 24 mEq/L (ref 19–32)
Calcium: 8.8 mg/dL (ref 8.4–10.5)
Chloride: 104 mEq/L (ref 96–112)
Creat: 1.98 mg/dL — ABNORMAL HIGH (ref 0.50–1.35)
GFR, EST NON AFRICAN AMERICAN: 30 mL/min — AB
GFR, Est African American: 35 mL/min — ABNORMAL LOW
Glucose, Bld: 134 mg/dL — ABNORMAL HIGH (ref 70–99)
Potassium: 4.5 mEq/L (ref 3.5–5.3)
Sodium: 136 mEq/L (ref 135–145)

## 2014-07-21 LAB — LIPID PANEL
Cholesterol: 127 mg/dL (ref 0–200)
HDL: 38 mg/dL — AB (ref >39)
LDL Cholesterol: 70 mg/dL (ref 0–99)
Total CHOL/HDL Ratio: 3.3 Ratio
Triglycerides: 93 mg/dL (ref <150)
VLDL: 19 mg/dL (ref 0–40)

## 2014-07-21 LAB — HEMOGLOBIN A1C
Hgb A1c MFr Bld: 6.1 % — ABNORMAL HIGH (ref <5.7)
Mean Plasma Glucose: 128 mg/dL — ABNORMAL HIGH (ref <117)

## 2014-07-21 LAB — MAGNESIUM: MAGNESIUM: 2.3 mg/dL (ref 1.5–2.5)

## 2014-07-21 LAB — VITAMIN D 25 HYDROXY (VIT D DEFICIENCY, FRACTURES): VIT D 25 HYDROXY: 76 ng/mL (ref 30–89)

## 2014-07-21 LAB — HEPATIC FUNCTION PANEL
ALBUMIN: 3.6 g/dL (ref 3.5–5.2)
ALT: 11 U/L (ref 0–53)
AST: 14 U/L (ref 0–37)
Alkaline Phosphatase: 76 U/L (ref 39–117)
Bilirubin, Direct: 0.1 mg/dL (ref 0.0–0.3)
Indirect Bilirubin: 0.3 mg/dL (ref 0.2–1.2)
Total Bilirubin: 0.4 mg/dL (ref 0.2–1.2)
Total Protein: 6.6 g/dL (ref 6.0–8.3)

## 2014-07-21 LAB — TSH: TSH: 2.255 u[IU]/mL (ref 0.350–4.500)

## 2014-07-21 LAB — URIC ACID: Uric Acid, Serum: 4.4 mg/dL (ref 4.0–7.8)

## 2014-07-24 ENCOUNTER — Other Ambulatory Visit: Payer: Self-pay

## 2014-07-24 ENCOUNTER — Encounter: Payer: Self-pay | Admitting: Internal Medicine

## 2014-07-24 MED ORDER — BUMETANIDE 2 MG PO TABS: ORAL_TABLET | ORAL | Status: DC

## 2014-08-30 ENCOUNTER — Encounter: Payer: Self-pay | Admitting: Internal Medicine

## 2014-08-30 MED ORDER — AMLODIPINE BESYLATE 5 MG PO TABS: 5.0000 mg | ORAL_TABLET | Freq: Every day | ORAL | Status: DC

## 2014-08-30 MED ORDER — LOSARTAN POTASSIUM 50 MG PO TABS: 50.0000 mg | ORAL_TABLET | Freq: Every day | ORAL | Status: DC

## 2014-10-30 ENCOUNTER — Encounter: Payer: Self-pay | Admitting: Internal Medicine

## 2014-10-30 ENCOUNTER — Other Ambulatory Visit: Payer: Self-pay | Admitting: *Deleted

## 2014-10-30 ENCOUNTER — Ambulatory Visit (INDEPENDENT_AMBULATORY_CARE_PROVIDER_SITE_OTHER): Payer: Medicare Other | Admitting: Physician Assistant

## 2014-10-30 ENCOUNTER — Other Ambulatory Visit: Payer: Self-pay | Admitting: Internal Medicine

## 2014-10-30 VITALS — BP 148/60 | HR 72 | Temp 98.8°F | Resp 16 | Ht 70.0 in | Wt 234.0 lb

## 2014-10-30 DIAGNOSIS — M25562 Pain in left knee: Secondary | ICD-10-CM

## 2014-10-30 DIAGNOSIS — R7303 Prediabetes: Secondary | ICD-10-CM

## 2014-10-30 DIAGNOSIS — Z79899 Other long term (current) drug therapy: Secondary | ICD-10-CM

## 2014-10-30 DIAGNOSIS — R7309 Other abnormal glucose: Secondary | ICD-10-CM

## 2014-10-30 DIAGNOSIS — M25561 Pain in right knee: Secondary | ICD-10-CM

## 2014-10-30 DIAGNOSIS — E039 Hypothyroidism, unspecified: Secondary | ICD-10-CM

## 2014-10-30 DIAGNOSIS — E782 Mixed hyperlipidemia: Secondary | ICD-10-CM

## 2014-10-30 DIAGNOSIS — E559 Vitamin D deficiency, unspecified: Secondary | ICD-10-CM

## 2014-10-30 DIAGNOSIS — Z23 Encounter for immunization: Secondary | ICD-10-CM

## 2014-10-30 DIAGNOSIS — I150 Renovascular hypertension: Secondary | ICD-10-CM

## 2014-10-30 MED ORDER — ALLOPURINOL 300 MG PO TABS: 300.0000 mg | ORAL_TABLET | Freq: Every day | ORAL | Status: DC

## 2014-10-30 NOTE — Patient Instructions (Signed)

## 2014-10-30 NOTE — Progress Notes (Signed)
Assessment and Plan:  Hypertension: Continue medication, monitor blood pressure at home. Continue DASH diet.  Reminder to go to the ER if any CP, SOB, nausea, dizziness, severe HA, changes vision/speech, left arm numbness and tingling, and jaw pain. Cholesterol: Continue diet and exercise. Check cholesterol.  Pre-diabetes-Continue diet and exercise. Check A1C Vitamin D Def- check level and continue medications.  Obesity with co morbidities- long discussion about weight loss, diet, and exercise Bilateral knee pain- will send for corticosteroid infections Prevnar 13  Continue diet and meds as discussed. Further disposition pending results of labs.  HPI 80 y.o. male  presents for 3 month follow up with hypertension, hyperlipidemia, prediabetes and vitamin D. His blood pressure has been controlled at home, today their BP is BP: (!) 148/60 mmHg He does not workout. He denies chest pain, shortness of breath, dizziness.  He is on cholesterol medication, pravastatin 40 and denies myalgias. His cholesterol is not at goal. The cholesterol last visit was:   Lab Results  Component Value Date   CHOL 127 07/20/2014   HDL 38* 07/20/2014   LDLCALC 70 07/20/2014   TRIG 93 07/20/2014   CHOLHDL 3.3 07/20/2014   He has been working on diet and exercise for prediabetes, and denies paresthesia of the feet, polydipsia and polyuria. Last A1C in the office was:  Lab Results  Component Value Date   HGBA1C 6.1* 07/20/2014   Patient is on Vitamin D supplement.   Lab Results  Component Value Date   VD25OH 33 07/20/2014     He is on thyroid medication. His medication was not changed last visit.  Lab Results  Component Value Date   TSH 2.255 07/20/2014  .  He is in a wheel chair now, will walk with walker at home, due to spinal stenosis and bilateral knee pain. Would like to get infection of both knees. Marland Kitchen  He follows with Dr. Wynonia Lawman and saw him Tuesday.   Current Medications:  Current Outpatient  Prescriptions on File Prior to Visit  Medication Sig Dispense Refill  . amLODipine (NORVASC) 5 MG tablet Take 1 tablet (5 mg total) by mouth daily before breakfast. 90 tablet 1  . aspirin EC 81 MG tablet Take 81 mg by mouth daily.    . bumetanide (BUMEX) 2 MG tablet Takes 1/2-1 pill daily for fluid 90 tablet 3  . Cholecalciferol (VITAMIN D3) 1000 UNITS CAPS Take 1,000 capsules by mouth 2 (two) times daily.    . clotrimazole-betamethasone (LOTRISONE) cream Apply twice daily to affected skin 45 g 2  . famotidine (PEPCID) 40 MG tablet Take 40 mg by mouth daily as needed. stomach    . FERROUS SULFATE PO Take 1 tablet by mouth daily.    Marland Kitchen levothyroxine (SYNTHROID, LEVOTHROID) 200 MCG tablet Take 0.5 tablets (100 mcg total) by mouth daily before breakfast. 90 tablet 1  . losartan (COZAAR) 50 MG tablet Take 1 tablet (50 mg total) by mouth daily. 90 tablet 1  . Magnesium 250 MG TABS Take 500 mg by mouth daily.     . Multiple Vitamin (MULITIVITAMIN WITH MINERALS) TABS Take 1 tablet by mouth daily.    . nitroGLYCERIN (NITROSTAT) 0.4 MG SL tablet Place 0.4 mg under the tongue every 5 (five) minutes as needed for chest pain.     Marland Kitchen nystatin cream (MYCOSTATIN) Apply twice a day to your feet 30 g 3  . pravastatin (PRAVACHOL) 40 MG tablet Take 1 tablet (40 mg total) by mouth at bedtime. 90 tablet 3  .  vitamin C (ASCORBIC ACID) 500 MG tablet Take 500 mg by mouth 2 (two) times daily.     No current facility-administered medications on file prior to visit.   Medical History:  Past Medical History  Diagnosis Date  . Hard of hearing   . Coronary artery disease   . Hearing deficit     hearing aids- bilateral  . Anemia   . Arthritis     stenosis - back, neck  . Cancer     renal neoplasm  . GERD (gastroesophageal reflux disease)   . Hypertension     sees Dr. Wynonia Lawman, stress test recently for prep for surgery  . Chronic kidney disease     renalCa- nephrectomy [facility]) 04/2012  . Hypothyroidism   . Vitamin D  deficiency   . Prediabetes    Allergies: No Known Allergies   Review of Systems:  Review of Systems  Constitutional: Negative.   HENT: Negative.   Eyes: Negative.   Respiratory: Negative.   Cardiovascular: Negative.   Gastrointestinal: Negative.   Genitourinary: Negative.   Musculoskeletal: Positive for back pain and joint pain. Negative for myalgias, falls and neck pain.  Skin: Negative.   Neurological: Negative.   Psychiatric/Behavioral: Negative.    Family history- Review and unchanged Social history- Review and unchanged Physical Exam: BP 148/60 mmHg  Pulse 72  Temp(Src) 98.8 F (37.1 C)  Resp 16  Ht 5\' 10"  (1.778 m)  Wt 234 lb (106.142 kg)  BMI 33.58 kg/m2 Wt Readings from Last 3 Encounters:  10/30/14 234 lb (106.142 kg)  07/20/14 232 lb (105.235 kg)  04/13/14 235 lb (106.595 kg)    HEENT: normocephalic, sclerae anicteric, TMs pearly, nares patent, no discharge or erythema, pharynx normal Oral cavity: MMM, no lesions Neck: supple, no lymphadenopathy, no thyromegaly, no masses Heart: RRR, normal S1, S2, no murmurs Lungs: CTA bilaterally, no wheezes, rhonchi, or rales Abdomen: +bs, soft, obese non tender, non distended, no masses, no hepatomegaly, no splenomegaly Musculoskeletal: patient has swelling bilateral knees, with tenderness., in wheelchair  Extremities: 1-2+  edema, no cyanosis, no clubbing Pulses: 2+ symmetric, upper and absent DP and decrease TP Neurological: alert, oriented x 3, CN2-12 intact, strength normal upper extremities and decreased 3/5 lower extremities, s DTRs 2+ throughout, no cerebellar signs, patient in wheel chair Psychiatric: normal affect, behavior normal, pleasant   Vicie Mutters, PA-C 5:32 PM [facility]

## 2014-10-31 LAB — HEPATIC FUNCTION PANEL
ALT: 10 U/L (ref 0–53)
AST: 13 U/L (ref 0–37)
Albumin: 3.4 g/dL — ABNORMAL LOW (ref 3.5–5.2)
Alkaline Phosphatase: 80 U/L (ref 39–117)
Bilirubin, Direct: 0.1 mg/dL (ref 0.0–0.3)
Indirect Bilirubin: 0.3 mg/dL (ref 0.2–1.2)
Total Bilirubin: 0.4 mg/dL (ref 0.2–1.2)
Total Protein: 6.5 g/dL (ref 6.0–8.3)

## 2014-10-31 LAB — LIPID PANEL
CHOL/HDL RATIO: 3.6 ratio
CHOLESTEROL: 134 mg/dL (ref 0–200)
HDL: 37 mg/dL — ABNORMAL LOW (ref >39)
LDL Cholesterol: 80 mg/dL (ref 0–99)
TRIGLYCERIDES: 86 mg/dL (ref <150)
VLDL: 17 mg/dL (ref 0–40)

## 2014-10-31 LAB — CBC WITH DIFFERENTIAL/PLATELET
BASOS ABS: 0.1 10*3/uL (ref 0.0–0.1)
Basophils Relative: 1 % (ref 0–1)
EOS ABS: 0.4 10*3/uL (ref 0.0–0.7)
EOS PCT: 6 % — AB (ref 0–5)
HCT: 31 % — ABNORMAL LOW (ref 39.0–52.0)
Hemoglobin: 9.9 g/dL — ABNORMAL LOW (ref 13.0–17.0)
Lymphocytes Relative: 24 % (ref 12–46)
Lymphs Abs: 1.6 10*3/uL (ref 0.7–4.0)
MCH: 30.4 pg (ref 26.0–34.0)
MCHC: 31.9 g/dL (ref 30.0–36.0)
MCV: 95.1 fL (ref 78.0–100.0)
MPV: 9.8 fL (ref 9.4–12.4)
Monocytes Absolute: 0.7 10*3/uL (ref 0.1–1.0)
Monocytes Relative: 11 % (ref 3–12)
Neutro Abs: 3.9 10*3/uL (ref 1.7–7.7)
Neutrophils Relative %: 58 % (ref 43–77)
PLATELETS: 263 10*3/uL (ref 150–400)
RBC: 3.26 MIL/uL — ABNORMAL LOW (ref 4.22–5.81)
RDW: 14.9 % (ref 11.5–15.5)
WBC: 6.8 10*3/uL (ref 4.0–10.5)

## 2014-10-31 LAB — BASIC METABOLIC PANEL WITH GFR
BUN: 39 mg/dL — ABNORMAL HIGH (ref 6–23)
CO2: 26 meq/L (ref 19–32)
Calcium: 8.9 mg/dL (ref 8.4–10.5)
Chloride: 104 mEq/L (ref 96–112)
Creat: 2.07 mg/dL — ABNORMAL HIGH (ref 0.50–1.35)
GFR, EST AFRICAN AMERICAN: 33 mL/min — AB
GFR, Est Non African American: 29 mL/min — ABNORMAL LOW
Glucose, Bld: 113 mg/dL — ABNORMAL HIGH (ref 70–99)
Potassium: 4.7 mEq/L (ref 3.5–5.3)
Sodium: 137 mEq/L (ref 135–145)

## 2014-10-31 LAB — TSH: TSH: 2.732 u[IU]/mL (ref 0.350–4.500)

## 2014-10-31 LAB — HEMOGLOBIN A1C
HEMOGLOBIN A1C: 6 % — AB (ref <5.7)
Mean Plasma Glucose: 126 mg/dL — ABNORMAL HIGH (ref <117)

## 2014-10-31 LAB — VITAMIN D 25 HYDROXY (VIT D DEFICIENCY, FRACTURES): VIT D 25 HYDROXY: 60 ng/mL (ref 30–100)

## 2014-10-31 LAB — INSULIN, FASTING: Insulin fasting, serum: 24 u[IU]/mL — ABNORMAL HIGH (ref 2.0–19.6)

## 2014-10-31 LAB — MAGNESIUM: Magnesium: 2.4 mg/dL (ref 1.5–2.5)

## 2014-11-14 ENCOUNTER — Other Ambulatory Visit: Payer: Self-pay | Admitting: Internal Medicine

## 2014-11-14 ENCOUNTER — Other Ambulatory Visit: Payer: Self-pay | Admitting: Physician Assistant

## 2014-11-14 MED ORDER — TRAMADOL HCL 50 MG PO TABS: 50.0000 mg | ORAL_TABLET | Freq: Four times a day (QID) | ORAL | Status: DC

## 2014-11-14 NOTE — Telephone Encounter (Signed)
The Tramadol Rx that was sent to the pharmacy was 04/13/14 with 5 refills.  Those refills have expired d/t longer then 6 months.  Patient did not fill rx in December.  Last fill was in November 2015.

## 2014-12-14 ENCOUNTER — Other Ambulatory Visit: Payer: Self-pay

## 2014-12-14 ENCOUNTER — Encounter: Payer: Self-pay | Admitting: Internal Medicine

## 2014-12-14 MED ORDER — AMLODIPINE BESYLATE 5 MG PO TABS: 5.0000 mg | ORAL_TABLET | Freq: Every day | ORAL | Status: DC

## 2014-12-18 ENCOUNTER — Other Ambulatory Visit: Payer: Self-pay | Admitting: Internal Medicine

## 2015-02-08 ENCOUNTER — Encounter: Payer: Self-pay | Admitting: Internal Medicine

## 2015-02-08 ENCOUNTER — Ambulatory Visit (INDEPENDENT_AMBULATORY_CARE_PROVIDER_SITE_OTHER): Payer: Medicare Other | Admitting: Internal Medicine

## 2015-02-08 VITALS — BP 134/62 | HR 64 | Temp 97.5°F | Resp 16 | Ht 70.0 in | Wt 234.0 lb

## 2015-02-08 DIAGNOSIS — R7309 Other abnormal glucose: Secondary | ICD-10-CM

## 2015-02-08 DIAGNOSIS — R6889 Other general symptoms and signs: Secondary | ICD-10-CM

## 2015-02-08 DIAGNOSIS — E039 Hypothyroidism, unspecified: Secondary | ICD-10-CM

## 2015-02-08 DIAGNOSIS — E559 Vitamin D deficiency, unspecified: Secondary | ICD-10-CM

## 2015-02-08 DIAGNOSIS — Z79899 Other long term (current) drug therapy: Secondary | ICD-10-CM

## 2015-02-08 DIAGNOSIS — E669 Obesity, unspecified: Secondary | ICD-10-CM

## 2015-02-08 DIAGNOSIS — Z0000 Encounter for general adult medical examination without abnormal findings: Secondary | ICD-10-CM

## 2015-02-08 DIAGNOSIS — R7303 Prediabetes: Secondary | ICD-10-CM

## 2015-02-08 DIAGNOSIS — E782 Mixed hyperlipidemia: Secondary | ICD-10-CM

## 2015-02-08 DIAGNOSIS — I150 Renovascular hypertension: Secondary | ICD-10-CM

## 2015-02-08 DIAGNOSIS — Z9181 History of falling: Secondary | ICD-10-CM

## 2015-02-08 DIAGNOSIS — I2510 Atherosclerotic heart disease of native coronary artery without angina pectoris: Secondary | ICD-10-CM

## 2015-02-08 DIAGNOSIS — Z1331 Encounter for screening for depression: Secondary | ICD-10-CM

## 2015-02-08 DIAGNOSIS — K219 Gastro-esophageal reflux disease without esophagitis: Secondary | ICD-10-CM

## 2015-02-08 DIAGNOSIS — Z0001 Encounter for general adult medical examination with abnormal findings: Secondary | ICD-10-CM

## 2015-02-08 DIAGNOSIS — N184 Chronic kidney disease, stage 4 (severe): Secondary | ICD-10-CM

## 2015-02-08 LAB — LIPID PANEL
Cholesterol: 129 mg/dL (ref 0–200)
HDL: 34 mg/dL — ABNORMAL LOW (ref >=40)
LDL CALC: 85 mg/dL (ref 0–99)
Total CHOL/HDL Ratio: 3.8 Ratio
Triglycerides: 49 mg/dL (ref <150)
VLDL: 10 mg/dL (ref 0–40)

## 2015-02-08 LAB — CBC WITH DIFFERENTIAL/PLATELET
Basophils Absolute: 0.1 10*3/uL (ref 0.0–0.1)
Basophils Relative: 1 % (ref 0–1)
Eosinophils Absolute: 0.5 10*3/uL (ref 0.0–0.7)
Eosinophils Relative: 6 % — ABNORMAL HIGH (ref 0–5)
HEMATOCRIT: 31 % — AB (ref 39.0–52.0)
Hemoglobin: 10 g/dL — ABNORMAL LOW (ref 13.0–17.0)
LYMPHS PCT: 23 % (ref 12–46)
Lymphs Abs: 2 10*3/uL (ref 0.7–4.0)
MCH: 30.6 pg (ref 26.0–34.0)
MCHC: 32.3 g/dL (ref 30.0–36.0)
MCV: 94.8 fL (ref 78.0–100.0)
MONO ABS: 0.9 10*3/uL (ref 0.1–1.0)
MONOS PCT: 10 % (ref 3–12)
MPV: 9.4 fL (ref 8.6–12.4)
Neutro Abs: 5.2 10*3/uL (ref 1.7–7.7)
Neutrophils Relative %: 60 % (ref 43–77)
Platelets: 252 10*3/uL (ref 150–400)
RBC: 3.27 MIL/uL — AB (ref 4.22–5.81)
RDW: 15.1 % (ref 11.5–15.5)
WBC: 8.7 10*3/uL (ref 4.0–10.5)

## 2015-02-08 LAB — BASIC METABOLIC PANEL WITH GFR
BUN: 48 mg/dL — AB (ref 6–23)
CALCIUM: 8.6 mg/dL (ref 8.4–10.5)
CO2: 24 mEq/L (ref 19–32)
Chloride: 102 mEq/L (ref 96–112)
Creat: 1.97 mg/dL — ABNORMAL HIGH (ref 0.50–1.35)
GFR, EST AFRICAN AMERICAN: 35 mL/min — AB
GFR, Est Non African American: 30 mL/min — ABNORMAL LOW
GLUCOSE: 112 mg/dL — AB (ref 70–99)
Potassium: 4.6 mEq/L (ref 3.5–5.3)
Sodium: 136 mEq/L (ref 135–145)

## 2015-02-08 LAB — HEPATIC FUNCTION PANEL
ALT: 11 U/L (ref 0–53)
AST: 13 U/L (ref 0–37)
Albumin: 3.4 g/dL — ABNORMAL LOW (ref 3.5–5.2)
Alkaline Phosphatase: 77 U/L (ref 39–117)
BILIRUBIN DIRECT: 0.1 mg/dL (ref 0.0–0.3)
Indirect Bilirubin: 0.1 mg/dL — ABNORMAL LOW (ref 0.2–1.2)
TOTAL PROTEIN: 6.3 g/dL (ref 6.0–8.3)
Total Bilirubin: 0.2 mg/dL (ref 0.2–1.2)

## 2015-02-08 LAB — INSULIN, RANDOM: Insulin: 7.8 u[IU]/mL (ref 2.0–19.6)

## 2015-02-08 LAB — MAGNESIUM: Magnesium: 2.7 mg/dL — ABNORMAL HIGH (ref 1.5–2.5)

## 2015-02-08 LAB — HEMOGLOBIN A1C
Hgb A1c MFr Bld: 6.3 % — ABNORMAL HIGH (ref <5.7)
Mean Plasma Glucose: 134 mg/dL — ABNORMAL HIGH (ref <117)

## 2015-02-08 NOTE — Progress Notes (Signed)
Patient ID: SANEL STEMMER, male   DOB: 05-29-1929, 81 y.o.   MRN: [id number]  MEDICARE ANNUAL WELLNESS VISIT AND OV  Assessment:   1. Renovascular hypertension  - TSH  2. Hyperlipidemia  - Lipid panel  3. Prediabetes  - Hemoglobin A1c - Insulin, random  4. Vitamin D deficiency  - Vit D  25 hydroxy   5. Hypothyroidism   6. GERD   7. ASCAD s/p Stents   8. Obesity (BMI 37)   9. Chronic kidney disease, stage 4 (severe)   10. Medication management  - CBC with Differential/Platelet - BASIC METABOLIC PANEL WITH GFR - Hepatic function panel - Magnesium  11. Routine general medical examination at a health care facility   Plan:   During the course of the visit the patient was educated and counseled about appropriate screening and preventive services including:    Pneumococcal vaccine   Influenza vaccine  Td vaccine  Screening electrocardiogram  Bone densitometry screening  Colorectal cancer screening  Diabetes screening  Glaucoma screening  Nutrition counseling   Advanced directives: requested  Screening recommendations, referrals: Vaccinations: Immunization History  Administered Date(s) Administered  . Influenza Split 09/06/2013  . Influenza, High Dose Seasonal PF 07/20/2014  . Pneumococcal Conjugate-13 10/30/2014  . Pneumococcal Polysaccharide-23 02/18/2012  . Td 02/18/2012  . Zoster 03/03/2013  Hep B vaccine not indicated  Nutrition assessed and recommended  Colonoscopy 2010 Recommended yearly ophthalmology/optometry visit for glaucoma screening and checkup Recommended yearly dental visit for hygiene and checkup Advanced directives - yes  Conditions/risks identified: BMI: Discussed weight loss, diet, and increase physical activity.  Increase physical activity: AHA recommends 150 minutes of physical activity a week.  Medications reviewed PreDiabetes is not at goal, ACE/ARB therapy: Yes. Urinary Incontinence is not an issue:  discussed non pharmacology and pharmacology options.  Fall risk: moderate- discussed PT, home fall assessment, medications.   Subjective:    TYEE VANDEVOORDE  presents for [employer] Visit and complete physical.  Date of last medicare wellness visit was 07/20/2014. This very nice 81 y.o. WWM presents for 3 month follow up with Hypertension, Hyperlipidemia, Pre-Diabetes and Vitamin D Deficiency.    Patient is treated for HTN since 1997 and has ASCAD and had Stent placement in 2009 & BP has been controlled at home. Today's BP: 134/62 mmHg. Patient has had no complaints of any cardiac type chest pain, palpitations, dyspnea/orthopnea/PND, dizziness, claudication, or dependent edema. Patient is unable to exercise due to his severe knee pains and general deconditioning.    Hyperlipidemia is controlled with diet & meds. Patient denies myalgias or other med SE's. Last Lipids were at goal - Total Chol 129; HDL 34; LDL 85; Trig 49 on 02/08/2015.   Also, the patient has history of Morbid Obesity (BMI 37) and is very non dietary compliant - and has consequent PreDiabetes and has had no symptoms of reactive hypoglycemia, diabetic polys, paresthesias or visual blurring.  Last A1c was  6.3% on 02/08/2015. Patient has hx/o gout and hyperuricemia has been controlled with Allopurinol.    Patient has compensated hypothyroidism and is stable on current meds w/o sn's / sx's of excess/deficiency. Further, the patient also has history of Vitamin D Deficiency of 24 in 2008 and supplements vitamin D without any suspected side-effects. Last vitamin D was 60 on 10/30/2014  Names of Other Physician/Practitioners you currently use: 1.  [facility] for [facility] 2. Dr Satira Sark, eye doctor, last visit Nov 2015 3. No dentist,  has dentures  Patient Care Team: Unk Pinto, MD as PCP - General (Internal Medicine) Jacolyn Reedy, MD as Consulting Physician  (Cardiology) Shon Hough, MD as Consulting Physician (Ophthalmology) Teena Irani, MD as Consulting Physician (Gastroenterology) Kathryne Hitch, MD as Consulting Physician (Orthopedic Surgery) Raynelle Bring, MD as Consulting Physician (Urology) Karie Chimera, MD as Consulting Physician (Neurosurgery) Marygrace Drought, MD as Consulting Physician (Ophthalmology)  Medication Review: Medication Sig  . allopurinol 300 MG tablet Take 1 tablet daily.  Marland Kitchen amLODipine  5 MG tablet Take 1 tab daily before breakfast.  . aspirin EC 81 MG tablet Take 81 mg  daily.  . bumetanide  2 MG tablet Takes 1/2-1 pill daily for fluid  . VITAMIN D3 1000 UNITS CAPS Take 1,000 cap 2  times daily.  Marland Kitchen  LOTRISONE cream Apply twice daily to affected skin  . famotidine  40 MG tablet Take  daily as needed. stomach  . FERROUS SULFATE PO Take 1 tab daily.  Marland Kitchen levothyroxine  200 MCG tablet Take 0.5 tab daily before breakfast.  . losartan  50 MG tablet Take 1 tab daily.  . Magnesium 250 MG  Take 500 mg daily.   . MULITIVITW/ MIN Take 1 tab daily.  . nitroGLYCERIN  0.4 MG SL tablet  as needed    . nystatin cream (MYCOSTATIN) Apply twice a day to your feet  . pravastatin (PRAVACHOL) 40 MG tablet Take 1 tab at bedtime.  . traMADol (ULTRAM) 50 MG tablet TAKE 1 TAB 4 TIMES A DAY  . vitamin C  500 MG tablet Take  2  times daily.   Current Problems (verified) Patient Active Problem List   Diagnosis Date Noted  . CAD (coronary artery disease), native coronary artery   . Obesity (BMI 30-39.9)   . Spinal stenosis   . Medication management 01/23/2014  . Hyperlipidemia 11/24/2013  . DJD   . Cancer of kidney   . GERD (gastroesophageal reflux disease)   . Hypertension   . Chronic kidney disease   . Hypothyroidism   . Vitamin D deficiency   . Prediabetes    Screening Tests Health Maintenance  Topic Date Due  . COLONOSCOPY  12/20/1980  . INFLUENZA VACCINE  06/11/2015  . TETANUS/TDAP  02/17/2022  . ZOSTAVAX  Completed  .  PNA vac Low Risk Adult  Completed   Immunization History  Administered Date(s) Administered  . Influenza Split 09/06/2013  . Influenza, High Dose Seasonal PF 07/20/2014  . Pneumococcal Conjugate-13 10/30/2014  . Pneumococcal Polysaccharide-23 02/18/2012  . Td 02/18/2012  . Zoster 03/03/2013   Preventative care: Last colonoscopy: 2010  History reviewed: allergies, current medications, past family history, past medical history, past social history, past surgical history and problem list  Risk Factors: Tobacco History  Substance Use Topics  . Smoking status: Former Smoker    Types: Cigarettes    Quit date: 11/10/1970  . Smokeless tobacco: Never Used  . Alcohol Use: 6.6 oz/week    2 Cans of beer, 9 Shots of liquor per week     Comment: friday- beer & 3 drambuie   He does not smoke.  Patient is a former smoker. Are there smokers in your home (other than you)?  No  Alcohol Current alcohol use: 3 beers per week(s)  Caffeine Current caffeine use: coffee 3-4 /day  Exercise Current exercise: none  Nutrition/Diet Current diet: in general, an "unhealthy" diet  Cardiac risk factors: advanced age (older than 52 for men, 21 for women), dyslipidemia, hypertension, male  gender, obesity (BMI >= 30 kg/m2), sedentary lifestyle and smoking/ tobacco exposure.  Depression Screen (Note: if answer to either of the following is "Yes", a more complete depression screening is indicated)   Q1: Over the past two weeks, have you felt down, depressed or hopeless? No  Q2: Over the past two weeks, have you felt little interest or pleasure in doing things? No  Have you lost interest or pleasure in daily life? No  Do you often feel hopeless? No  Do you cry easily over simple problems? No  Activities of Daily Living In your present state of health, do you have any difficulty performing the following activities?:  Driving? No Managing money?  No Feeding yourself? No Getting from bed to chair?  No Climbing a flight of stairs? No Preparing food and eating?: No Bathing or showering? No Getting dressed: No Getting to the toilet? No Using the toilet:No Moving around from place to place: No In the past year have you fallen or had a near fall?:Yes   Are you sexually active?  No  Do you have more than one partner?  No  Vision Difficulties: No  Hearing Difficulties: yes Do you often ask people to speak up or repeat themselves? yes Do you experience ringing or noises in your ears? No Do you have difficulty understanding soft or whispered voices? yes  Cognition  Do you feel that you have a problem with memory?No  Do you often misplace items? No  Do you feel safe at home?  Yes  Advanced directives Does patient have a [place]? Yes Does patient have a Living Will? Yes  Past Medical History  Diagnosis Date  . Hard of hearing   . Coronary artery disease   . Hearing deficit     hearing aids- bilateral  . Anemia   . Arthritis     stenosis - back, neck  . Cancer     renal neoplasm  . GERD (gastroesophageal reflux disease)   . Hypertension     sees Dr. Wynonia Lawman, stress test recently for prep for surgery  . Chronic kidney disease     renalCa- nephrectomy [facility]) 04/2012  . Hypothyroidism   . Vitamin D deficiency   . Prediabetes     Past Surgical History  Procedure Laterality Date  . Joint replacement      bilateral knee replacements   . Cholecystectomy    . Tonsillectomy    . Appendectomy    . Eye surgery      right eye cataract surgery   . Laparoscopic nephrectomy  04/29/2012    Procedure: LAPAROSCOPIC NEPHRECTOMY;  Surgeon: Dutch Gray, MD;  Location: [facility];  Service: Urology;  Laterality: Left;      . Coronary angioplasty      stents , + 7-42yrs. ago  . Back surgery      lower back surgery - 1990's  . Anterior cervical decomp/discectomy fusion N/A 01/18/2013    Procedure: ANTERIOR CERVICAL DECOMPRESSION/DISCECTOMY FUSION 2 LEVELS;  Surgeon:  Faythe Ghee, MD;  Location: [facility];  Service: Neurosurgery;  Laterality: N/A;    ROS: Constitutional: Denies fever, chills, weight loss/gain, headaches, insomnia, fatigue, night sweats or change in appetite. Eyes: Denies redness, blurred vision, diplopia, discharge, itchy or watery eyes.  ENT: Denies discharge, congestion, post nasal drip, epistaxis, sore throat, earache,  dental pain, Tinnitus, Vertigo, Sinus pain or snoring. Has hearing loss & wears hearing aids Cardio: Denies chest pain, palpitations, irregular heartbeat, syncope,  dyspnea, diaphoresis, orthopnea, PND, claudication or edema Respiratory: denies cough, dyspnea, DOE, pleurisy, hoarseness, laryngitis or wheezing.  Gastrointestinal: Denies dysphagia, heartburn, reflux, water brash, pain, cramps, nausea, vomiting, bloating, diarrhea, constipation, hematemesis, melena, hematochezia, jaundice or hemorrhoids Genitourinary: Denies dysuria, frequency, urgency, nocturia, hesitancy, discharge, hematuria or flank pain Musculoskeletal: Denies arthralgia, myalgia, stiffness, Jt. Swelling, pain, limp or strain/sprain. Denies Falls. Skin: Denies puritis, rash, hives, warts, acne, eczema or change in skin lesion Neuro: No weakness, tremor, incoordination, spasms, paresthesia or pain Psychiatric: Denies confusion, memory loss or sensory loss. Denies Depression. Endocrine: Denies change in weight, skin, hair change, nocturia, and paresthesia, diabetic polys, visual blurring or hyper / hypo glycemic episodes.  Heme/Lymph: No excessive bleeding, bruising or enlarged lymph nodes.  Objective:     BP 134/62   Pulse 64  Temp 97.5 F   Resp 16  Ht 5\' 10"    Wt 234 lb     BMI 33.58   General Appearance:  Elderly Obese   male  in no apparent distress. Eyes: PERRLA, EOMs, conjunctiva no swelling or erythema, normal fundi and vessels. Sinuses: No frontal/maxillary tenderness ENT/Mouth: EACs patent / TMs  nl. Nares clear without erythema,  swelling, mucoid exudates. Oral hygiene is good. No erythema, swelling, or exudate. Tongue normal, non-obstructing. Tonsils not swollen or erythematous. Hearing normal.  Neck: Supple, thyroid normal. No bruits, nodes or JVD. Respiratory: Respiratory effort normal.  BS equal and clear bilateral without rales, rhonci, wheezing or stridor. Cardio: Heart sounds are normal with regular rate and rhythm and no murmurs, rubs or gallops. Peripheral pulses are normal and equal bilaterally without edema. No aortic or femoral bruits. Chest: symmetric with normal excursions and percussion.  Abdomen: Flat, soft, with nl bowel sounds. Nontender, no guarding, rebound, hernias, masses, or organomegaly.  Lymphatics: Non tender without lymphadenopathy.  Genitourinary: No hernias.Testes nl. DRE - prostate nl for age - smooth & firm w/o nodules. Musculoskeletal: Generalized decrease muscle power, tone & bulk. Scars over both knees. Gait is  broad based with difficulty going from sitting to standing position.  Skin: Warm and dry without rashes, lesions, cyanosis, clubbing or  ecchymosis.  Neuro: Cranial nerves intact, reflexes equal bilaterally. Normal muscle tone, no cerebellar symptoms. Sensation intact.  Pysch: Awake and oriented X 3 with normal affect, insight and judgment appropriate.   Cognitive Testing  Alert? Yes  Normal Appearance? Yes  Oriented to person? Yes  Place? Yes   Time? Yes  Recall of three objects?  Yes  Can perform simple calculations? Yes  Displays appropriate judgment? Yes  Can read the correct time from a watch/clock? Yes  Medicare Attestation I have personally reviewed: The patient's medical and social history Their use of alcohol, tobacco or illicit drugs Their current medications and supplements The patient's functional ability including ADLs,fall risks, home safety risks, cognitive, and hearing and visual impairment Diet and physical activities Evidence for depression or mood  disorders  The patient's weight, height, BMI, and visual acuity have been recorded in the chart.  I have made referrals, counseling, and provided education to the patient based on review of the above and I have provided the patient with a written personalized care plan for preventive services.  Over 40 minutes of exam, counseling, chart review was performed.   Dewanda Fennema DAVID, MD   02/08/2015

## 2015-02-08 NOTE — Patient Instructions (Signed)

## 2015-02-09 LAB — VITAMIN D 25 HYDROXY (VIT D DEFICIENCY, FRACTURES): Vit D, 25-Hydroxy: 72 ng/mL (ref 30–100)

## 2015-02-09 LAB — TSH: TSH: 2.515 u[IU]/mL (ref 0.350–4.500)

## 2015-03-07 ENCOUNTER — Other Ambulatory Visit: Payer: Self-pay

## 2015-03-07 MED ORDER — LOSARTAN POTASSIUM 50 MG PO TABS: 50.0000 mg | ORAL_TABLET | Freq: Every day | ORAL | Status: DC

## 2015-03-07 MED ORDER — PRAVASTATIN SODIUM 40 MG PO TABS: 40.0000 mg | ORAL_TABLET | Freq: Every day | ORAL | Status: DC

## 2015-04-17 ENCOUNTER — Encounter: Payer: Self-pay | Admitting: Internal Medicine

## 2015-04-20 ENCOUNTER — Encounter: Payer: Self-pay | Admitting: Internal Medicine

## 2015-04-20 ENCOUNTER — Other Ambulatory Visit: Payer: Self-pay | Admitting: Internal Medicine

## 2015-04-20 DIAGNOSIS — M150 Primary generalized (osteo)arthritis: Principal | ICD-10-CM

## 2015-04-20 DIAGNOSIS — M159 Polyosteoarthritis, unspecified: Secondary | ICD-10-CM

## 2015-04-20 MED ORDER — TRAMADOL HCL 50 MG PO TABS: ORAL_TABLET | ORAL | Status: DC

## 2015-05-17 ENCOUNTER — Ambulatory Visit: Payer: Self-pay | Admitting: Internal Medicine

## 2015-05-17 ENCOUNTER — Ambulatory Visit (INDEPENDENT_AMBULATORY_CARE_PROVIDER_SITE_OTHER): Payer: Medicare Other | Admitting: Internal Medicine

## 2015-05-17 ENCOUNTER — Encounter: Payer: Self-pay | Admitting: Internal Medicine

## 2015-05-17 VITALS — BP 126/58 | HR 84 | Temp 98.0°F | Resp 16

## 2015-05-17 DIAGNOSIS — E559 Vitamin D deficiency, unspecified: Secondary | ICD-10-CM

## 2015-05-17 DIAGNOSIS — Z79899 Other long term (current) drug therapy: Secondary | ICD-10-CM

## 2015-05-17 DIAGNOSIS — I150 Renovascular hypertension: Secondary | ICD-10-CM

## 2015-05-17 DIAGNOSIS — R7303 Prediabetes: Secondary | ICD-10-CM

## 2015-05-17 DIAGNOSIS — E039 Hypothyroidism, unspecified: Secondary | ICD-10-CM

## 2015-05-17 DIAGNOSIS — E669 Obesity, unspecified: Secondary | ICD-10-CM

## 2015-05-17 DIAGNOSIS — E782 Mixed hyperlipidemia: Secondary | ICD-10-CM

## 2015-05-17 DIAGNOSIS — R7309 Other abnormal glucose: Secondary | ICD-10-CM

## 2015-05-17 LAB — HEPATIC FUNCTION PANEL
ALBUMIN: 3.6 g/dL (ref 3.5–5.2)
ALK PHOS: 79 U/L (ref 39–117)
ALT: 12 U/L (ref 0–53)
AST: 13 U/L (ref 0–37)
BILIRUBIN INDIRECT: 0.2 mg/dL (ref 0.2–1.2)
BILIRUBIN TOTAL: 0.3 mg/dL (ref 0.2–1.2)
Bilirubin, Direct: 0.1 mg/dL (ref 0.0–0.3)
TOTAL PROTEIN: 6.5 g/dL (ref 6.0–8.3)

## 2015-05-17 LAB — CBC WITH DIFFERENTIAL/PLATELET
BASOS ABS: 0.1 10*3/uL (ref 0.0–0.1)
BASOS PCT: 1 % (ref 0–1)
EOS ABS: 0.4 10*3/uL (ref 0.0–0.7)
EOS PCT: 5 % (ref 0–5)
HCT: 33 % — ABNORMAL LOW (ref 39.0–52.0)
Hemoglobin: 10.6 g/dL — ABNORMAL LOW (ref 13.0–17.0)
Lymphocytes Relative: 31 % (ref 12–46)
Lymphs Abs: 2.4 10*3/uL (ref 0.7–4.0)
MCH: 31.4 pg (ref 26.0–34.0)
MCHC: 32.1 g/dL (ref 30.0–36.0)
MCV: 97.6 fL (ref 78.0–100.0)
MONO ABS: 0.7 10*3/uL (ref 0.1–1.0)
MPV: 9.6 fL (ref 8.6–12.4)
Monocytes Relative: 9 % (ref 3–12)
Neutro Abs: 4.2 10*3/uL (ref 1.7–7.7)
Neutrophils Relative %: 54 % (ref 43–77)
PLATELETS: 233 10*3/uL (ref 150–400)
RBC: 3.38 MIL/uL — ABNORMAL LOW (ref 4.22–5.81)
RDW: 15.3 % (ref 11.5–15.5)
WBC: 7.7 10*3/uL (ref 4.0–10.5)

## 2015-05-17 LAB — BASIC METABOLIC PANEL WITH GFR
BUN: 48 mg/dL — AB (ref 6–23)
CHLORIDE: 104 meq/L (ref 96–112)
CO2: 23 meq/L (ref 19–32)
Calcium: 8.7 mg/dL (ref 8.4–10.5)
Creat: 2.13 mg/dL — ABNORMAL HIGH (ref 0.50–1.35)
GFR, EST AFRICAN AMERICAN: 32 mL/min — AB
GFR, Est Non African American: 28 mL/min — ABNORMAL LOW
GLUCOSE: 103 mg/dL — AB (ref 70–99)
POTASSIUM: 4.7 meq/L (ref 3.5–5.3)
Sodium: 139 mEq/L (ref 135–145)

## 2015-05-17 LAB — LIPID PANEL
Cholesterol: 154 mg/dL (ref 0–200)
HDL: 38 mg/dL — ABNORMAL LOW (ref >=40)
LDL Cholesterol: 99 mg/dL (ref 0–99)
Total CHOL/HDL Ratio: 4.1 Ratio
Triglycerides: 84 mg/dL (ref <150)
VLDL: 17 mg/dL (ref 0–40)

## 2015-05-17 LAB — MAGNESIUM: Magnesium: 2.5 mg/dL (ref 1.5–2.5)

## 2015-05-17 LAB — HEMOGLOBIN A1C
HEMOGLOBIN A1C: 6 % — AB (ref <5.7)
Mean Plasma Glucose: 126 mg/dL — ABNORMAL HIGH (ref <117)

## 2015-05-17 LAB — TSH: TSH: 5.148 u[IU]/mL — AB (ref 0.350–4.500)

## 2015-05-17 MED ORDER — AMLODIPINE BESYLATE 5 MG PO TABS: 5.0000 mg | ORAL_TABLET | Freq: Every day | ORAL | Status: DC

## 2015-05-17 NOTE — Patient Instructions (Signed)

## 2015-05-17 NOTE — Progress Notes (Signed)
Patient ID: [name], male   DOB: 1929/04/12, 81 y.o.   MRN: [id number] Assessment and Plan:  Hypertension:  -Continue medication,  -recommended taking fluid pill earlier to prevent night time urination -monitor blood pressure at home.  -Continue DASH diet.   -Reminder to go to the ER if any CP, SOB, nausea, dizziness, severe HA, changes vision/speech, left arm numbness and tingling, and jaw pain.  Cholesterol: -Continue diet and exercise.  -Check cholesterol.   Pre-diabetes: -Continue diet and exercise.  -Check A1C  Vitamin D Def: -check level -continue medications.   Mild Constipation -increase water intake -increase magnesium to twice daily prn   Continue diet and meds as discussed. Further disposition pending results of labs.  HPI 81 y.o. male  presents for 3 month follow up with hypertension, hyperlipidemia, prediabetes and vitamin D.   His blood pressure has been controlled at home, today their BP is BP: (!) 126/58 mmHg.   He does not workout. He denies chest pain, shortness of breath, dizziness.   He is on cholesterol medication and denies myalgias. His cholesterol is not at goal. The cholesterol last visit was:   Lab Results  Component Value Date   CHOL 129 02/08/2015   HDL 34* 02/08/2015   LDLCALC 85 02/08/2015   TRIG 49 02/08/2015   CHOLHDL 3.8 02/08/2015     He has not been working on diet and exercise for prediabetes, and denies foot ulcerations, hyperglycemia, hypoglycemia , increased appetite, nausea, paresthesia of the feet, polydipsia, polyuria, visual disturbances, vomiting and weight loss. Last A1C in the office was:  Lab Results  Component Value Date   HGBA1C 6.3* 02/08/2015    Patient is on Vitamin D supplement.  Lab Results  Component Value Date   VD25OH 72 02/08/2015     Patient reports that he has been using voltaren gel to help with his knee pain.  He reports that he feels that it is helping.  He is also going to see a Restaurant manager, fast food.   He feels like he is moving better.    Current Medications:  Current Outpatient Prescriptions on File Prior to Visit  Medication Sig Dispense Refill  . allopurinol (ZYLOPRIM) 300 MG tablet Take 1 tablet (300 mg total) by mouth daily. 90 tablet 4  . aspirin EC 81 MG tablet Take 81 mg by mouth daily.    . bumetanide (BUMEX) 2 MG tablet Takes 1/2-1 pill daily for fluid 90 tablet 3  . Cholecalciferol (VITAMIN D3) 1000 UNITS CAPS Take 1,000 capsules by mouth 2 (two) times daily.    . clotrimazole-betamethasone (LOTRISONE) cream Apply twice daily to affected skin 45 g 2  . famotidine (PEPCID) 40 MG tablet Take 40 mg by mouth daily as needed. stomach    . FERROUS SULFATE PO Take 1 tablet by mouth daily.    Marland Kitchen levothyroxine (SYNTHROID, LEVOTHROID) 200 MCG tablet Take 0.5 tablets (100 mcg total) by mouth daily before breakfast. 90 tablet 1  . losartan (COZAAR) 50 MG tablet Take 1 tablet (50 mg total) by mouth daily. 90 tablet 4  . Magnesium 250 MG TABS Take 500 mg by mouth daily.     . Multiple Vitamin (MULITIVITAMIN WITH MINERALS) TABS Take 1 tablet by mouth daily.    . nitroGLYCERIN (NITROSTAT) 0.4 MG SL tablet Place 0.4 mg under the tongue every 5 (five) minutes as needed for chest pain.     Marland Kitchen nystatin cream (MYCOSTATIN) Apply twice a day to your feet 30 g 3  .  pravastatin (PRAVACHOL) 40 MG tablet Take 1 tablet (40 mg total) by mouth at bedtime. 90 tablet 4  . traMADol (ULTRAM) 50 MG tablet Take 1 tablet up to 3 x day if needed for severe pain 90 tablet 2  . vitamin C (ASCORBIC ACID) 500 MG tablet Take 500 mg by mouth 2 (two) times daily.     No current facility-administered medications on file prior to visit.    Medical History:  Past Medical History  Diagnosis Date  . Hard of hearing   . Coronary artery disease   . Hearing deficit     hearing aids- bilateral  . Anemia   . Arthritis     stenosis - back, neck  . Cancer     renal neoplasm  . GERD (gastroesophageal reflux disease)   .  Hypertension     sees Dr. Wynonia Lawman, stress test recently for prep for surgery  . Chronic kidney disease     renalCa- nephrectomy [facility]) 04/2012  . Hypothyroidism   . Vitamin D deficiency   . Prediabetes     Allergies: No Known Allergies   Review of Systems:  Review of Systems  Constitutional: Negative for fever, chills and malaise/fatigue.  HENT: Negative for congestion, ear pain and sore throat.   Eyes: Negative.   Respiratory: Negative for cough, shortness of breath and wheezing.   Cardiovascular: Positive for leg swelling. Negative for chest pain and palpitations.  Gastrointestinal: Positive for heartburn and constipation. Negative for nausea, diarrhea, blood in stool and melena.  Genitourinary: Negative.   Skin: Negative.   Neurological: Negative for dizziness, sensory change, loss of consciousness and headaches.  Psychiatric/Behavioral: Negative for depression. The patient is not nervous/anxious and does not have insomnia.     Family history- Review and unchanged  Social history- Review and unchanged  Physical Exam: BP 126/58 mmHg  Pulse 84  Temp(Src) 98 F (36.7 C) (Temporal)  Resp 16 Wt Readings from Last 3 Encounters:  02/08/15 234 lb (106.142 kg)  10/30/14 234 lb (106.142 kg)  07/20/14 232 lb (105.235 kg)    General Appearance: Well nourished well developed, in no apparent distress. Eyes: PERRLA, EOMs, conjunctiva no swelling or erythema ENT/Mouth: Ear canals normal without obstruction, swelling, erythma, discharge.  TMs normal bilaterally.  Oropharynx moist, clear, without exudate, or postoropharyngeal swelling. Neck: Supple, thyroid normal,no cervical adenopathy  Respiratory: Respiratory effort normal, Breath sounds clear A&P without rhonchi, wheeze, or rale.  No retractions, no accessory usage. Cardio:Distant S1S2 with soft 2/6 murmur on LSB. Brisk peripheral pulses without edema.  Abdomen: Soft, + BS,  Non tender, no guarding, rebound, hernias,  masses. Musculoskeletal: Full ROM, 5/5 strength, Non ambulatory gait.  Patient in wheelchair due to knee pain. Skin: Warm, dry without rashes, lesions, scattered ecchymosis.  Neuro: Awake and oriented X 3, Cranial nerves intact. Normal muscle tone, no cerebellar symptoms. Psych: Normal affect, Insight and Judgment appropriate.    Starlyn Skeans, PA-C 10:07 AM [facility]

## 2015-05-18 ENCOUNTER — Other Ambulatory Visit: Payer: Self-pay | Admitting: Internal Medicine

## 2015-05-18 LAB — INSULIN, RANDOM: Insulin: 6.7 u[IU]/mL (ref 2.0–19.6)

## 2015-05-18 LAB — VITAMIN D 25 HYDROXY (VIT D DEFICIENCY, FRACTURES): Vit D, 25-Hydroxy: 60 ng/mL (ref 30–100)

## 2015-05-18 MED ORDER — LEVOTHYROXINE SODIUM 150 MCG PO TABS: 150.0000 ug | ORAL_TABLET | Freq: Every day | ORAL | Status: DC

## 2015-06-13 ENCOUNTER — Encounter: Payer: Self-pay | Admitting: Internal Medicine

## 2015-06-25 ENCOUNTER — Telehealth: Payer: Self-pay | Admitting: Oncology

## 2015-06-25 NOTE — Telephone Encounter (Signed)
New patient appt-s/w patient and gave np appt for 08/24 @ 1:30 w/Dr. Alen Blew.  Referring Dr. Raynelle Bring Dx- Renal Cell carcinoma w/new growing pulmonary nodule concerning for mets disease  Referral information scanned

## 2015-07-04 ENCOUNTER — Ambulatory Visit: Payer: Medicare Other

## 2015-07-04 ENCOUNTER — Other Ambulatory Visit: Payer: Self-pay

## 2015-07-04 ENCOUNTER — Ambulatory Visit (HOSPITAL_BASED_OUTPATIENT_CLINIC_OR_DEPARTMENT_OTHER): Payer: Medicare Other | Admitting: Oncology

## 2015-07-04 ENCOUNTER — Telehealth: Payer: Self-pay | Admitting: Oncology

## 2015-07-04 DIAGNOSIS — C649 Malignant neoplasm of unspecified kidney, except renal pelvis: Secondary | ICD-10-CM

## 2015-07-04 DIAGNOSIS — R911 Solitary pulmonary nodule: Secondary | ICD-10-CM

## 2015-07-04 NOTE — Progress Notes (Signed)
Please see consult note.  

## 2015-07-04 NOTE — Telephone Encounter (Signed)
Pt confirmed labs/ov per 08/24 POF, gave pt AVS and Calendar.... KJ

## 2015-07-04 NOTE — Consult Note (Signed)
Reason for Referral: Kidney cancer.   HPI: 81 year old gentleman currently of [place] where he lived for many years. He has a history of smoking but quit in the 70s as well as history of coronary artery disease, arthritis and renal insufficiency. He was diagnosed with kidney cancer back in 2013 after presenting with a painless lump at his left flank. He was found to have a 9.6 x 9.2 x 7.8 cm left renal neoplasm with central necrosis suspicious for malignancy. He was evaluated by Dr. Alinda Money and underwent a left laparoscopic radical nephrectomy on 04/29/2012. 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 The margins were negative of tumor without any evidence of local lymphadenopathy. The final pathological staging was T2 1 N0. He remained disease free with a CT scan obtained on August 2015 showed no evidence of metastasis in the chest abdomen and pelvis.Repeat imaging studies in August 2016 showed a right lower lobe nodule measuring 17 mm which is increased significantly in size from 3 mm in comparison to previous imaging studies in February 2015 and August 2015. No evidence of any other pulmonary nodules or any other metastasis noted. He was referred to me for further evaluation regarding these findings. Clinically, he is asymptomatic from his cancer. He does not report any cough or shortness of breath. He does not report any hemoptysis. He does not report any constitutional symptoms of weight loss. He is limited with his performance status and uses a walker for his mobility. He lives alone but depends on his daughter for all of his needs including shopping and helping with his appointments. He does drive short distances. He does have debilitating arthritis and in a wheelchair today.  He does not report any headaches, blurry vision, syncope or seizures. He does not report any fevers, chills, sweats weight loss or appetite changes. He does not  report any chest pain, cough, hemoptysis or hematemesis. He does not report any nausea, vomiting, abdominal pain, early satiety, change in his bowel habits or rectal bleeding. He does not report any frequency, urgency, hesitancy or skeletal complaints. He does not report any lymphadenopathy or petechiae. He does report hearing deficits and the remaining review of system is unremarkable.   Past Medical History  Diagnosis Date  . Hard of hearing   . Coronary artery disease   . Hearing deficit     hearing aids- bilateral  . Anemia   . Arthritis     stenosis - back, neck  . Cancer     renal neoplasm  . GERD (gastroesophageal reflux disease)   . Hypertension     sees Dr. Wynonia Lawman, stress test recently for prep for surgery  . Chronic kidney disease     renalCa- nephrectomy [facility]) 04/2012  . Hypothyroidism   . Vitamin D deficiency   . Prediabetes   :  Past Surgical History  Procedure Laterality Date  . Joint replacement      bilateral knee replacements   . Cholecystectomy    . Tonsillectomy    . Appendectomy    . Eye surgery      right eye cataract surgery   . Laparoscopic nephrectomy  04/29/2012    Procedure: LAPAROSCOPIC NEPHRECTOMY;  Surgeon: Dutch Gray, MD;  Location: [facility];  Service: Urology;  Laterality: Left;      . Coronary angioplasty      stents , + 7-78yr. ago  . Back surgery  lower back surgery - 1990's  . Anterior cervical decomp/discectomy fusion N/A 01/18/2013    Procedure: ANTERIOR CERVICAL DECOMPRESSION/DISCECTOMY FUSION 2 LEVELS;  Surgeon: Faythe Ghee, MD;  Location: [facility];  Service: Neurosurgery;  Laterality: N/A;  :   Current outpatient prescriptions:  .  allopurinol (ZYLOPRIM) 300 MG tablet, Take 1 tablet (300 mg total) by mouth daily., Disp: 90 tablet, Rfl: 4 .  amLODipine (NORVASC) 5 MG tablet, Take 1 tablet (5 mg total) by mouth daily before breakfast., Disp: 90 tablet, Rfl: 0 .  aspirin EC 81 MG tablet, Take 81 mg by mouth daily., Disp: ,  Rfl:  .  bumetanide (BUMEX) 2 MG tablet, Takes 1/2-1 pill daily for fluid, Disp: 90 tablet, Rfl: 3 .  Cholecalciferol (VITAMIN D3) 1000 UNITS CAPS, Take 1,000 capsules by mouth 2 (two) times daily., Disp: , Rfl:  .  clotrimazole-betamethasone (LOTRISONE) cream, Apply twice daily to affected skin, Disp: 45 g, Rfl: 2 .  famotidine (PEPCID) 40 MG tablet, Take 40 mg by mouth daily as needed. stomach, Disp: , Rfl:  .  FERROUS SULFATE PO, Take 1 tablet by mouth daily., Disp: , Rfl:  .  levothyroxine (SYNTHROID, LEVOTHROID) 150 MCG tablet, Take 1 tablet (150 mcg total) by mouth daily before breakfast., Disp: 90 tablet, Rfl: 0 .  losartan (COZAAR) 50 MG tablet, Take 1 tablet (50 mg total) by mouth daily., Disp: 90 tablet, Rfl: 4 .  Magnesium 250 MG TABS, Take 500 mg by mouth daily. , Disp: , Rfl:  .  Multiple Vitamin (MULITIVITAMIN WITH MINERALS) TABS, Take 1 tablet by mouth daily., Disp: , Rfl:  .  nitroGLYCERIN (NITROSTAT) 0.4 MG SL tablet, Place 0.4 mg under the tongue every 5 (five) minutes as needed for chest pain. , Disp: , Rfl:  .  nystatin cream (MYCOSTATIN), Apply twice a day to your feet, Disp: 30 g, Rfl: 3 .  pravastatin (PRAVACHOL) 40 MG tablet, Take 1 tablet (40 mg total) by mouth at bedtime., Disp: 90 tablet, Rfl: 4 .  traMADol (ULTRAM) 50 MG tablet, Take 1 tablet up to 3 x day if needed for severe pain, Disp: 90 tablet, Rfl: 2 .  vitamin C (ASCORBIC ACID) 500 MG tablet, Take 500 mg by mouth 2 (two) times daily., Disp: , Rfl: :  No Known Allergies:  No family history on file.:  Social History   Social History  . Marital Status: Widowed    Spouse Name: N/A  . Number of Children: N/A  . Years of Education: N/A   Occupational History  . Not on file.   Social History Main Topics  . Smoking status: Former Smoker    Types: Cigarettes    Quit date: 11/10/1970  . Smokeless tobacco: Never Used  . Alcohol Use: 6.6 oz/week    2 Cans of beer, 9 Shots of liquor per week     Comment:  friday- beer & 3 drambuie  . Drug Use: No  . Sexual Activity: Not on file   Other Topics Concern  . Not on file   Social History Narrative  :  Pertinent items are noted in HPI.  Exam: There were no vitals taken for this visit. General appearance: alert and cooperative Head: Normocephalic, without obvious abnormality Throat: lips, mucosa, and tongue normal; teeth and gums normal Neck: no adenopathy Back: negative Resp: clear to auscultation bilaterally Chest wall: no tenderness Cardio: regular rate and rhythm, S1, S2 normal, no murmur, click, rub or gallop GI: soft, non-tender; bowel  sounds normal; no masses,  no organomegaly Extremities: extremities normal, atraumatic, no cyanosis or edema Pulses: 2+ and symmetric Skin: Skin color, texture, turgor normal. No rashes or lesions Lymph nodes: Cervical, supraclavicular, and axillary nodes normal.  CBC    Component Value Date/Time   WBC 7.7 05/17/2015 1024   RBC 3.38* 05/17/2015 1024   HGB 10.6* 05/17/2015 1024   HCT 33.0* 05/17/2015 1024   PLT 233 05/17/2015 1024   MCV 97.6 05/17/2015 1024   MCH 31.4 05/17/2015 1024   MCHC 32.1 05/17/2015 1024   RDW 15.3 05/17/2015 1024   LYMPHSABS 2.4 05/17/2015 1024   MONOABS 0.7 05/17/2015 1024   EOSABS 0.4 05/17/2015 1024   BASOSABS 0.1 05/17/2015 1024     Chemistry      Component Value Date/Time   NA 139 05/17/2015 1024   K 4.7 05/17/2015 1024   CL 104 05/17/2015 1024   CO2 23 05/17/2015 1024   BUN 48* 05/17/2015 1024   CREATININE 2.13* 05/17/2015 1024   CREATININE 2.11* 12/20/2013 1600      Component Value Date/Time   CALCIUM 8.7 05/17/2015 1024   ALKPHOS 79 05/17/2015 1024   AST 13 05/17/2015 1024   ALT 12 05/17/2015 1024   BILITOT 0.3 05/17/2015 1024       Assessment and Plan:   81 year old gentleman with the following issues:  1. Renal cell carcinoma diagnosed in June 2013. He presented with a 9 cm mass and underwent a radical nephrectomy on June 2013. The  final pathology showed clear cell renal cell carcinoma with the final pathological staging of T2a without any lymph node involvement. He remained disease-free with imaging studies up to August 2016.  2. 17 mm right lower lung nodule discovered on a CT scan on August 2016. The differential diagnosis was discussed with the patient and his daughter today. The most likely etiology would be metastatic renal cell carcinoma of the lung. Primary lung neoplasm is a  possibility as well. He does have a remote smoking history in the 58s but was rather heavy per his report. A benign etiology is a possibility but less likely.  From a management standpoint, ideally surgical resection of the pulmonary nodule would be diagnostic and potentially therapeutic. I do not think he would be a candidate for lung resection given his comorbid conditions.  Alternatively,  percutaneous biopsy would be reasonable to establish the exact pathology.  If we are dealing with an isolated renal cell neoplasm metastasis or if it's a primary lung tumor, ablative radiation therapy would be an excellent alternative to surgery.  Interval observation and surveillance would be also reasonable given his age and performance status if biopsy cannot be obtained.  Systemic therapy is not indicated at this time given the low volume disease he is experiencing after 3 year disease-free interval. He will likely require systemic therapy in the future and I will like to defer that to a later date.  I will refer him to radiation oncology for an opinion as well as attempt to obtain a percutaneous biopsy by interventional radiology.  3. Follow-up: The next few weeks to follow-up on his progress. I will a happy to coordinate any further imaging for him moving forward given the fact that he has likely stage IV disease.

## 2015-07-10 ENCOUNTER — Encounter: Payer: Self-pay | Admitting: Radiation Oncology

## 2015-07-10 NOTE — Progress Notes (Signed)
Thoracic Location of Tumor / Histology: right lower lobe nodule measuring 17 mm suspicious for metastatic clear cell renal cell neoplasm  Patient had a repeat imaging studies in August 2016 which showed a right lower lobe nodule measuring 17 mm. This was a significant increase in size from 3 mm in previous imaging on February 2015.   Pathology:   Tobacco/Marijuana/Snuff/ETOH use: Former smoker of cigarettes. Quit smoking 11/10/1970  Past/Anticipated interventions by cardiothoracic surgery, if any: Dr. Alen Blew referring patient to Dr. Tammi Klippel for an opinion as well as attempt to obtain a percutaneous biopsy by interventional radiology.  Past/Anticipated interventions by medical oncology, if any: Diagnosed June 2013 with renal cell carcinoma. Remained disease free until August 2016. Surgical resection would ideal if it were for the patient's comorbid conditions. Referral made to radiation oncology to ablate tumor as an alternative to surgery.   Signs/Symptoms  Weight changes, if any: no  Respiratory complaints, if any: no  Hemoptysis, if any: no  Pain issues, if any:  no  SAFETY ISSUES:  Prior radiation? no  Pacemaker/ICD? No but, does have cardiac stents  Possible current pregnancy?no  Is the patient on methotrexate? no  Current Complaints / other details:  81 year old male. Lives alone with help from his daughter. Drives short distances. Uses a walker to get around. Widowed. HOH. Worked for [employer].

## 2015-07-11 ENCOUNTER — Ambulatory Visit
Admission: RE | Admit: 2015-07-11 | Discharge: 2015-07-11 | Disposition: A | Discharge status: Home / Self Care | Payer: Medicare Other | Source: Ambulatory Visit | Attending: Radiation Oncology | Admitting: Radiation Oncology

## 2015-07-11 ENCOUNTER — Encounter: Payer: Self-pay | Admitting: Radiation Oncology

## 2015-07-11 VITALS — BP 135/47 | HR 67 | Temp 98.0°F | Resp 18 | Ht 69.0 in | Wt 230.0 lb

## 2015-07-11 DIAGNOSIS — Z510 Encounter for antineoplastic radiation therapy: Secondary | ICD-10-CM | POA: Diagnosis not present

## 2015-07-11 DIAGNOSIS — C7801 Secondary malignant neoplasm of right lung: Secondary | ICD-10-CM | POA: Insufficient documentation

## 2015-07-11 DIAGNOSIS — C649 Malignant neoplasm of unspecified kidney, except renal pelvis: Secondary | ICD-10-CM | POA: Diagnosis not present

## 2015-07-11 DIAGNOSIS — C801 Malignant (primary) neoplasm, unspecified: Secondary | ICD-10-CM

## 2015-07-11 DIAGNOSIS — D381 Neoplasm of uncertain behavior of trachea, bronchus and lung: Secondary | ICD-10-CM | POA: Diagnosis present

## 2015-07-11 NOTE — Progress Notes (Signed)
Radiation Oncology         ([phone]) [phone] ________________________________  Initial outpatient Consultation  Name: [name] MRN: [id number]  Date: 07/11/2015  DOB: 12/08/1928  MH:[id number] DAVID, MD  Wyatt Portela, MD   REFERRING PHYSICIAN: Wyatt Portela, MD  DIAGNOSIS: The encounter diagnosis was Neoplasm of uncertain behavior of trachea, bronchus, and lung. 81 yo gentleman with right lower lobe nodule measuring 17 mm suspicious for metastatic clear cell renal cell neoplasm    ICD-9-CM ICD-10-CM   1. Neoplasm of uncertain behavior of trachea, bronchus, and lung 235.7 D38.1     HISTORY OF PRESENT ILLNESS::[name] is a 81 y.o. male who oringinally came in with a lump in the left flank area. He was found to have a left kidney mass and it was removed by Dr. Alinda Money on 04/29/12. This was diagnosed in June 2013 as grade 3 renal cell carcinoma 9 cm in size with no evidence of any lymph node enlargement. Mr. Rodier has been followed, with a CT scan in August 2015 showing no recurrence. However, a new right lower lung nodule measuring 17 mm was seen in the August 2016 CT scan.    PREVIOUS RADIATION THERAPY: No  PAST MEDICAL HISTORY:  has a past medical history of Hard of hearing; Coronary artery disease; Hearing deficit; Anemia; Arthritis; Cancer; GERD (gastroesophageal reflux disease); Hypertension; Chronic kidney disease; Hypothyroidism; Vitamin D deficiency; Prediabetes; and Lung cancer.    PAST SURGICAL HISTORY: Past Surgical History  Procedure Laterality Date  . Joint replacement      bilateral knee replacements   . Cholecystectomy    . Tonsillectomy    . Appendectomy    . Eye surgery      right eye cataract surgery   . Laparoscopic nephrectomy  04/29/2012    Procedure: LAPAROSCOPIC NEPHRECTOMY;  Surgeon: Dutch Gray, MD;  Location: [facility];  Service: Urology;  Laterality: Left;      . Coronary angioplasty      stents , + 7-17yr. ago  . Back surgery     lower back surgery - 1990's  . Anterior cervical decomp/discectomy fusion N/A 01/18/2013    Procedure: ANTERIOR CERVICAL DECOMPRESSION/DISCECTOMY FUSION 2 LEVELS;  Surgeon: RFaythe Ghee MD;  Location: [facility];  Service: Neurosurgery;  Laterality: N/A;    FAMILY HISTORY: family history includes Cancer in his brother and sister.  SOCIAL HISTORY:  Social History   Social History  . Marital Status: Widowed    Spouse Name: N/A  . Number of Children: N/A  . Years of Education: N/A   Occupational History  . Not on file.   Social History Main Topics  . Smoking status: Former Smoker    Types: Cigarettes    Quit date: 11/10/1970  . Smokeless tobacco: Never Used  . Alcohol Use: 6.6 oz/week    2 Cans of beer, 9 Shots of liquor per week     Comment: friday- beer & 3 drambuie  . Drug Use: No  . Sexual Activity: No   Other Topics Concern  . Not on file   Social History Narrative    ALLERGIES: Review of patient's allergies indicates no known allergies.  MEDICATIONS:  Current Outpatient Prescriptions  Medication Sig Dispense Refill  . allopurinol (ZYLOPRIM) 300 MG tablet Take 1 tablet (300 mg total) by mouth daily. 90 tablet 4  . amLODipine (NORVASC) 5 MG tablet Take 1 tablet (5 mg total) by mouth daily before breakfast. 90 tablet 0  . aspirin  EC 81 MG tablet Take 81 mg by mouth daily.    . bumetanide (BUMEX) 2 MG tablet Takes 1/2-1 pill daily for fluid 90 tablet 3  . Cholecalciferol (VITAMIN D3) 1000 UNITS CAPS Take 1,000 capsules by mouth 2 (two) times daily.    . diclofenac sodium (VOLTAREN) 1 % GEL Apply topically 4 (four) times daily.    Marland Kitchen FERROUS SULFATE PO Take 1 tablet by mouth daily.    Marland Kitchen levothyroxine (SYNTHROID, LEVOTHROID) 150 MCG tablet Take 1 tablet (150 mcg total) by mouth daily before breakfast. 90 tablet 0  . losartan (COZAAR) 50 MG tablet Take 1 tablet (50 mg total) by mouth daily. 90 tablet 4  . Magnesium 250 MG TABS Take 500 mg by mouth daily.     .  Multiple Vitamin (MULITIVITAMIN WITH MINERALS) TABS Take 1 tablet by mouth daily.    . pravastatin (PRAVACHOL) 40 MG tablet Take 1 tablet (40 mg total) by mouth at bedtime. 90 tablet 4  . traMADol (ULTRAM) 50 MG tablet Take 1 tablet up to 3 x day if needed for severe pain 90 tablet 2  . vitamin C (ASCORBIC ACID) 500 MG tablet Take 500 mg by mouth 2 (two) times daily.    . clotrimazole-betamethasone (LOTRISONE) cream Apply twice daily to affected skin (Patient not taking: Reported on 07/11/2015) 45 g 2  . famotidine (PEPCID) 40 MG tablet Take 40 mg by mouth daily as needed. stomach    . nitroGLYCERIN (NITROSTAT) 0.4 MG SL tablet Place 0.4 mg under the tongue every 5 (five) minutes as needed for chest pain.     Marland Kitchen nystatin cream (MYCOSTATIN) Apply twice a day to your feet (Patient not taking: Reported on 07/11/2015) 30 g 3   No current facility-administered medications for this encounter.    REVIEW OF SYSTEMS:  A 15 point review of systems is documented in the electronic medical record. This was obtained by the nursing staff. However, I reviewed this with the patient to discuss relevant findings and make appropriate changes.  A comprehensive review of systems was negative.   PHYSICAL EXAM:  height is '5\' 9"'$  (1.753 m) and weight is 230 lb (104.327 kg). His oral temperature is 98 F (36.7 C). His blood pressure is 135/47 and his pulse is 67. His respiration is 18 and oxygen saturation is 98%.   per med-onc  General appearance: alert and cooperative Head: Normocephalic, without obvious abnormality Throat: lips, mucosa, and tongue normal; teeth and gums normal Neck: no adenopathy Back: negative Resp: clear to auscultation bilaterally Chest wall: no tenderness Cardio: regular rate and rhythm, S1, S2 normal, no murmur, click, rub or gallop GI: soft, non-tender; bowel sounds normal; no masses, no organomegaly Extremities: extremities normal, atraumatic, no cyanosis or edema Pulses: 2+ and  symmetric Skin: Skin color, texture, turgor normal. No rashes or lesions Lymph nodes: Cervical, supraclavicular, and axillary nodes normal.  KPS = 100  100 - Normal; no complaints; no evidence of disease. 90   - Able to carry on normal activity; minor signs or symptoms of disease. 80   - Normal activity with effort; some signs or symptoms of disease. 70   - Cares for self; unable to carry on normal activity or to do active work. 60   - Requires occasional assistance, but is able to care for most of his personal needs. 50   - Requires considerable assistance and frequent medical care. 7   - Disabled; requires special care and assistance. 30   -  Severely disabled; hospital admission is indicated although death not imminent. 40   - Very sick; hospital admission necessary; active supportive treatment necessary. 10   - Moribund; fatal processes progressing rapidly. 0     - Dead  Karnofsky DA, Abelmann Sarben, Craver LS and Burchenal [facility] [phone]) The use of the nitrogen mustards in the palliative treatment of carcinoma: with particular reference to bronchogenic carcinoma Cancer 1 634-56  LABORATORY DATA:  Lab Results  Component Value Date   WBC 7.7 05/17/2015   HGB 10.6* 05/17/2015   HCT 33.0* 05/17/2015   MCV 97.6 05/17/2015   PLT 233 05/17/2015   Lab Results  Component Value Date   NA 139 05/17/2015   K 4.7 05/17/2015   CL 104 05/17/2015   CO2 23 05/17/2015   Lab Results  Component Value Date   ALT 12 05/17/2015   AST 13 05/17/2015   ALKPHOS 79 05/17/2015   BILITOT 0.3 05/17/2015     RADIOGRAPHY: CT reviewed with patient    IMPRESSION: Mr. Revolorio is a pleasant 81 yo gentleman with right lower lobe nodule measuring 17 mm suspicious for metastatic clear cell renal cell neoplasm, versus early stage primary lung cancer.   PLAN: Today, I talked to the patient and family about the findings and work-up thus far.  We discussed the natural history of solitary small malignant pulmonary  nodules (primary versus metastatic) and general treatment, highlighting the role of radiotherapy in the management.  We discussed the available radiation techniques, and focused on the details of logistics and delivery.  We reviewed the anticipated acute and late sequelae associated with radiation in this setting.  The patient was encouraged to ask questions that I answered to the best of my ability.  I filled out a patient counseling form during our discussion including treatment diagrams.  We retained a copy for our records.    If CT biopsy confirms malignancy, we will likely plan SBRT radiation treatment.   I spent 60 minutes minutes face to face with the patient and more than 50% of that time was spent in counseling and/or coordination of care.   This document serves as a record of services personally performed by [name], MD. It was created on his behalf by Arlyce Harman, a trained medical scribe. The creation of this record is based on the scribe's personal observations and the provider's statements to them. This document has been checked and approved by the attending provider.    ------------------------------------------------  Sheral Apley Tammi Klippel, M.D.

## 2015-07-11 NOTE — Progress Notes (Signed)
See progress note under physician encounter. 

## 2015-07-17 ENCOUNTER — Encounter: Payer: Self-pay | Admitting: Internal Medicine

## 2015-07-18 ENCOUNTER — Other Ambulatory Visit: Payer: Self-pay | Admitting: Internal Medicine

## 2015-07-18 DIAGNOSIS — M159 Polyosteoarthritis, unspecified: Secondary | ICD-10-CM

## 2015-07-18 DIAGNOSIS — M150 Primary generalized (osteo)arthritis: Principal | ICD-10-CM

## 2015-07-18 MED ORDER — TRAMADOL HCL 50 MG PO TABS: ORAL_TABLET | ORAL | Status: AC

## 2015-07-27 ENCOUNTER — Other Ambulatory Visit: Payer: Self-pay | Admitting: Radiology

## 2015-07-30 ENCOUNTER — Ambulatory Visit (HOSPITAL_COMMUNITY)
Admission: RE | Admit: 2015-07-30 | Discharge: 2015-07-30 | Disposition: A | Discharge status: Home / Self Care | Payer: Medicare Other | Source: Ambulatory Visit | Attending: Oncology | Admitting: Oncology

## 2015-07-30 ENCOUNTER — Encounter (HOSPITAL_COMMUNITY): Payer: Self-pay

## 2015-07-30 ENCOUNTER — Ambulatory Visit (HOSPITAL_COMMUNITY)
Admission: RE | Admit: 2015-07-30 | Discharge: 2015-07-30 | Disposition: A | Discharge status: Home / Self Care | Payer: Medicare Other | Source: Ambulatory Visit | Attending: Interventional Radiology | Admitting: Interventional Radiology

## 2015-07-30 DIAGNOSIS — Z87891 Personal history of nicotine dependence: Secondary | ICD-10-CM | POA: Insufficient documentation

## 2015-07-30 DIAGNOSIS — Z7982 Long term (current) use of aspirin: Secondary | ICD-10-CM | POA: Insufficient documentation

## 2015-07-30 DIAGNOSIS — E039 Hypothyroidism, unspecified: Secondary | ICD-10-CM | POA: Diagnosis not present

## 2015-07-30 DIAGNOSIS — R911 Solitary pulmonary nodule: Secondary | ICD-10-CM

## 2015-07-30 DIAGNOSIS — Z85528 Personal history of other malignant neoplasm of kidney: Secondary | ICD-10-CM | POA: Insufficient documentation

## 2015-07-30 DIAGNOSIS — Z905 Acquired absence of kidney: Secondary | ICD-10-CM | POA: Diagnosis not present

## 2015-07-30 DIAGNOSIS — I2510 Atherosclerotic heart disease of native coronary artery without angina pectoris: Secondary | ICD-10-CM | POA: Diagnosis not present

## 2015-07-30 DIAGNOSIS — E559 Vitamin D deficiency, unspecified: Secondary | ICD-10-CM | POA: Diagnosis not present

## 2015-07-30 DIAGNOSIS — C7801 Secondary malignant neoplasm of right lung: Secondary | ICD-10-CM | POA: Diagnosis not present

## 2015-07-30 DIAGNOSIS — R918 Other nonspecific abnormal finding of lung field: Secondary | ICD-10-CM | POA: Diagnosis present

## 2015-07-30 DIAGNOSIS — Z79899 Other long term (current) drug therapy: Secondary | ICD-10-CM | POA: Insufficient documentation

## 2015-07-30 DIAGNOSIS — I129 Hypertensive chronic kidney disease with stage 1 through stage 4 chronic kidney disease, or unspecified chronic kidney disease: Secondary | ICD-10-CM | POA: Diagnosis not present

## 2015-07-30 DIAGNOSIS — K219 Gastro-esophageal reflux disease without esophagitis: Secondary | ICD-10-CM | POA: Diagnosis not present

## 2015-07-30 DIAGNOSIS — C649 Malignant neoplasm of unspecified kidney, except renal pelvis: Secondary | ICD-10-CM

## 2015-07-30 LAB — CBC
HEMATOCRIT: 35.4 % — AB (ref 39.0–52.0)
HEMOGLOBIN: 11.4 g/dL — AB (ref 13.0–17.0)
MCH: 31.8 pg (ref 26.0–34.0)
MCHC: 32.2 g/dL (ref 30.0–36.0)
MCV: 98.6 fL (ref 78.0–100.0)
Platelets: 218 10*3/uL (ref 150–400)
RBC: 3.59 MIL/uL — ABNORMAL LOW (ref 4.22–5.81)
RDW: 14.5 % (ref 11.5–15.5)
WBC: 8.7 10*3/uL (ref 4.0–10.5)

## 2015-07-30 LAB — PROTIME-INR
INR: 1 (ref 0.00–1.49)
Prothrombin Time: 13.4 seconds (ref 11.6–15.2)

## 2015-07-30 LAB — APTT: APTT: 28 s (ref 24–37)

## 2015-07-30 MED ORDER — MIDAZOLAM HCL 2 MG/2ML IJ SOLN
INTRAMUSCULAR | Status: AC
  Filled 2015-07-30: qty 2

## 2015-07-30 MED ORDER — MIDAZOLAM HCL 2 MG/2ML IJ SOLN
INTRAMUSCULAR | Status: AC | PRN
  Administered 2015-07-30: 0.5 mg via INTRAVENOUS

## 2015-07-30 MED ORDER — FENTANYL CITRATE (PF) 100 MCG/2ML IJ SOLN
INTRAMUSCULAR | Status: AC
  Filled 2015-07-30: qty 2

## 2015-07-30 MED ORDER — SODIUM CHLORIDE 0.9 % IV SOLN
INTRAVENOUS | Status: DC
  Administered 2015-07-30: 500 mL via INTRAVENOUS

## 2015-07-30 MED ORDER — FENTANYL CITRATE (PF) 100 MCG/2ML IJ SOLN
INTRAMUSCULAR | Status: AC | PRN
  Administered 2015-07-30: 25 ug via INTRAVENOUS

## 2015-07-30 NOTE — Sedation Documentation (Signed)
Pt began coughing after second sample was removed from from right lower lobe lung mass. 50cc of bright red blood was suctioned immediately from patient. Post images of biopsy were then taken and patient was moved from prone position on table back to supine position on stretcher. O2 sat 98-100% on 2L entire time. Stat chest xray and 1hr post procedure chest xray ordered. Will continue to monitor patient status.

## 2015-07-30 NOTE — Discharge Instructions (Signed)
Needle Biopsy of Lung, Care After °Refer to this sheet in the next few weeks. These instructions provide you with information on caring for yourself after your procedure. Your health care provider may also give you more specific instructions. Your treatment has been planned according to current medical practices, but problems sometimes occur. Call your health care provider if you have any problems or questions after your procedure. °WHAT TO EXPECT AFTER THE PROCEDURE °· A bandage will be applied over the area where the needle was inserted. You may be asked to apply pressure to the bandage for several minutes to ensure there is minimal bleeding. °· In most cases, you can leave when your needle biopsy procedure is completed. Do not drive yourself home. Someone else should take you home. °· If you received an IV sedative or general anesthetic, you will be taken to a comfortable place to relax while the medicine wears off. °· If you have upcoming travel scheduled, talk to your health care provider about when it is safe to travel by air after the procedure. °HOME CARE INSTRUCTIONS °· Expect to take it easy for the rest of the day. °· Protect the area where you received the needle biopsy by keeping the bandage in place for as long as instructed. °· You may feel some mild pain or discomfort in the area, but this should stop in a day or two. °· Take medicines only as directed by your health care provider. °SEEK MEDICAL CARE IF:  °· You have pain at the biopsy site that worsens or is not helped by medicine. °· You have swelling or drainage at the needle biopsy site. °· You have a fever. °SEEK IMMEDIATE MEDICAL CARE IF:  °· You have new or worsening shortness of breath. °· You have chest pain. °· You are coughing up blood. °· You have bleeding that does not stop with pressure or a bandage. °· You develop light-headedness or fainting. °Document Released: 08/24/2007 Document Revised: 03/13/2014 Document Reviewed:  03/21/2013 °ExitCare® Patient Information ©2015 [employer]. This information is not intended to replace advice given to you by your health care provider. Make sure you discuss any questions you have with your health care provider. °Conscious Sedation, Adult, Care After °Refer to this sheet in the next few weeks. These instructions provide you with information on caring for yourself after your procedure. Your health care provider may also give you more specific instructions. Your treatment has been planned according to current medical practices, but problems sometimes occur. Call your health care provider if you have any problems or questions after your procedure. °WHAT TO EXPECT AFTER THE PROCEDURE  °After your procedure: °· You may feel sleepy, clumsy, and have poor balance for several hours. °· Vomiting may occur if you eat too soon after the procedure. °HOME CARE INSTRUCTIONS °· Do not participate in any activities where you could become injured for at least 24 hours. Do not: °¨ Drive. °¨ Swim. °¨ Ride a bicycle. °¨ Operate heavy machinery. °¨ Cook. °¨ Use power tools. °¨ Climb ladders. °¨ Work from a high place. °· Do not make important decisions or sign legal documents until you are improved. °· If you vomit, drink water, juice, or soup when you can drink without vomiting. Make sure you have little or no nausea before eating solid foods. °· Only take over-the-counter or prescription medicines for pain, discomfort, or fever as directed by your health care provider. °· Make sure you and your family fully understand everything about the   medicines given to you, including what side effects may occur. °· You should not drink alcohol, take sleeping pills, or take medicines that cause drowsiness for at least 24 hours. °· If you smoke, do not smoke without supervision. °· If you are feeling better, you may resume normal activities 24 hours after you were sedated. °· Keep all appointments with your health care  provider. °SEEK MEDICAL CARE IF: °· Your skin is pale or bluish in color. °· You continue to feel nauseous or vomit. °· Your pain is getting worse and is not helped by medicine. °· You have bleeding or swelling. °· You are still sleepy or feeling clumsy after 24 hours. °SEEK IMMEDIATE MEDICAL CARE IF: °· You develop a rash. °· You have difficulty breathing. °· You develop any type of allergic problem. °· You have a fever. °MAKE SURE YOU: °· Understand these instructions. °· Will watch your condition. °· Will get help right away if you are not doing well or get worse. °Document Released: 08/17/2013 Document Reviewed: 08/17/2013 °ExitCare® Patient Information ©2015 [employer]. This information is not intended to replace advice given to you by your health care provider. Make sure you discuss any questions you have with your health care provider. ° °

## 2015-07-30 NOTE — Procedures (Signed)
Successful RLL NODULE BX WITH CT GUIDANCE SMALL AMT HEMOPTYSIS POST BX, RESOLVING NO PTX PATH PENDING FULL REPORT IN PACS VSS CXR PENDING

## 2015-07-30 NOTE — H&P (Signed)
Chief Complaint: Right lung mass  Referring Physician(s): Shadad,Firas N  History of Present Illness: [name] is a 81 y.o. male, former smoker,  with history of left RCC 2013/prior nephrectomy and now with 17 mm RLL lung nodule on recent CT . He presents today for CT guided RLL lung mass biopsy.   Past Medical History  Diagnosis Date  . Hard of hearing   . Coronary artery disease   . Hearing deficit     hearing aids- bilateral  . Anemia   . Arthritis     stenosis - back, neck  . Cancer     renal neoplasm  . GERD (gastroesophageal reflux disease)   . Hypertension     sees Dr. Wynonia Lawman, stress test recently for prep for surgery  . Chronic kidney disease     renalCa- nephrectomy [facility]) 04/2012  . Hypothyroidism   . Vitamin D deficiency   . Prediabetes   . 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 Joint replacement      bilateral knee replacements   . Cholecystectomy    . Tonsillectomy    . Appendectomy    . Eye surgery      right eye cataract surgery   . Laparoscopic nephrectomy  04/29/2012    Procedure: LAPAROSCOPIC NEPHRECTOMY;  Surgeon: Dutch Gray, MD;  Location: [facility] ORS;  Service: Urology;  Laterality: Left;      . Coronary angioplasty      stents , + 7-29yr. ago  . Back surgery      lower back surgery - 1990's  . Anterior cervical decomp/discectomy fusion N/A 01/18/2013    Procedure: ANTERIOR CERVICAL DECOMPRESSION/DISCECTOMY FUSION 2 LEVELS;  Surgeon: RFaythe Ghee MD;  Location: [facility];  Service: Neurosurgery;  Laterality: N/A;    Allergies: Review of patient's allergies indicates no known allergies.  Medications: Prior to Admission medications   Medication Sig Start Date End Date Taking? Authorizing Provider  allopurinol (ZYLOPRIM) 300 MG tablet Take 1 tablet (300 mg total) by mouth daily. Patient taking differently: Take 150  mg by mouth daily.  10/30/14  Yes WUnk Pinto MD  amLODipine (NORVASC) 5 MG tablet Take 1 tablet (5 mg total) by mouth daily before breakfast. 05/17/15  Yes Courtney Forcucci, PA-C  aspirin EC 81 MG tablet Take 81 mg by mouth daily.   Yes Historical Provider, MD  bumetanide (BUMEX) 2 MG tablet Takes 1/2-1 pill daily for fluid Patient taking differently: Take 1 mg by mouth daily. Takes 1/2-1 pill daily for fluid 07/24/14  Yes WUnk Pinto MD  Cholecalciferol (VITAMIN D3) 1000 UNITS CAPS Take 2,000 capsules by mouth daily.    Yes Historical Provider, MD  diclofenac sodium (VOLTAREN) 1 % GEL Apply topically 4 (four) times daily.   Yes Historical Provider, MD  famotidine (PEPCID) 40 MG tablet Take 40 mg by mouth daily as needed. stomach   Yes Historical Provider, MD  FERROUS SULFATE PO Take 1 tablet by mouth 2 (two) times daily.    Yes Historical Provider, MD  levothyroxine (SYNTHROID, LEVOTHROID) 200 MCG tablet Take 200 mcg by mouth daily before breakfast.   Yes Historical Provider, MD  losartan (COZAAR) 50 MG tablet Take 1 tablet (50 mg total) by mouth daily. 03/07/15  Yes WUnk Pinto MD  Magnesium 250 MG TABS Take 500 mg by mouth daily.  Yes Historical Provider, MD  Multiple Vitamin (MULITIVITAMIN WITH MINERALS) TABS Take 1 tablet by mouth daily.   Yes Historical Provider, MD  nitroGLYCERIN (NITROSTAT) 0.4 MG SL tablet Place 0.4 mg under the tongue every 5 (five) minutes as needed for chest pain.    Yes Historical Provider, MD  pravastatin (PRAVACHOL) 40 MG tablet Take 1 tablet (40 mg total) by mouth at bedtime. 03/07/15  Yes Unk Pinto, MD  traMADol Veatrice Bourbon) 50 MG tablet Take 1 tablet up to 3 x day if needed for severe pain 07/18/15 10/18/15 Yes Unk Pinto, MD  vitamin C (ASCORBIC ACID) 500 MG tablet Take 500 mg by mouth 2 (two) times daily.   Yes Historical Provider, MD  clotrimazole-betamethasone (LOTRISONE) cream Apply twice daily to affected skin Patient not taking: Reported  on 07/11/2015 07/20/14   Vicie Mutters, PA-C  nystatin cream (MYCOSTATIN) Apply twice a day to your feet Patient not taking: Reported on 07/11/2015 07/20/14   Vicie Mutters, PA-C     Family History  Problem Relation Age of Onset  . Cancer Sister     breast  . Cancer Brother     colon    Social History   Social History  . Marital Status: Widowed    Spouse Name: N/A  . Number of Children: N/A  . Years of Education: N/A   Social History Main Topics  . Smoking status: Former Smoker    Types: Cigarettes    Quit date: 11/10/1970  . Smokeless tobacco: Never Used  . Alcohol Use: 6.6 oz/week    2 Cans of beer, 9 Shots of liquor per week     Comment: friday- beer & 3 drambuie  . Drug Use: No  . Sexual Activity: No   Other Topics Concern  . None   Social History Narrative      Review of Systems  Constitutional: Negative for fever and chills.  HENT: Positive for hearing loss.   Respiratory: Negative for shortness of breath.        Occ cough  Cardiovascular: Negative for chest pain.  Gastrointestinal: Negative for nausea, vomiting, abdominal pain and blood in stool.  Genitourinary: Negative for dysuria and hematuria.  Musculoskeletal:       Occ back pain  Neurological: Negative for headaches.    Vital Signs: BP 149/58 mmHg  Pulse 69  Temp(Src) 97.9 F (36.6 C) (Oral)  Resp 18  Ht '5\' 9"'$  (1.753 m)  Wt 230 lb (104.327 kg)  BMI 33.95 kg/m2  SpO2 98%  Physical Exam  Constitutional: He is oriented to person, place, and time. He appears well-developed and well-nourished.  Cardiovascular: Normal rate and regular rhythm.   Pulmonary/Chest: Effort normal and breath sounds normal.  Abdominal: Soft. Bowel sounds are normal. There is no tenderness.  Musculoskeletal: Normal range of motion.  Trace pretibial edema  Neurological: He is alert and oriented to person, place, and time.    Mallampati Score:     Imaging: No results found.  Labs:  CBC:  Recent Labs   10/30/14 1747 02/08/15 1116 05/17/15 1024 07/30/15 1113  WBC 6.8 8.7 7.7 8.7  HGB 9.9* 10.0* 10.6* 11.4*  HCT 31.0* 31.0* 33.0* 35.4*  PLT 263 252 233 218    COAGS:  Recent Labs  07/30/15 1113  INR 1.00  APTT 28    BMP:  Recent Labs  10/30/14 1747 02/08/15 1116 05/17/15 1024  NA 137 136 139  K 4.7 4.6 4.7  CL 104 102 104  CO2 26 24  23  GLUCOSE 113* 112* 103*  BUN 39* 48* 48*  CALCIUM 8.9 8.6 8.7  CREATININE 2.07* 1.97* 2.13*  GFRNONAA 29* 30* 28*  GFRAA 33* 35* 32*    LIVER FUNCTION TESTS:  Recent Labs  10/30/14 1747 02/08/15 1116 05/17/15 1024  BILITOT 0.4 0.2 0.3  AST '13 13 13  '$ ALT '10 11 12  '$ ALKPHOS 80 77 79  PROT 6.5 6.3 6.5  ALBUMIN 3.4* 3.4* 3.6    TUMOR MARKERS: No results for input(s): AFPTM, CEA, CA199, CHROMGRNA in the last 8760 hours.  Assessment and Plan: [name] is a 81 y.o. male, former smoker,  with history of left RCC 2013/prior nephrectomy and now with 17 mm RLL lung nodule on recent CT . He presents today for CT guided RLL lung mass biopsy. Risks and benefits discussed with the patient/daughter including, but not limited to bleeding, infection, possible chest tube placement, damage to adjacent structures or low yield requiring additional tests and death. All of the patient's questions were answered, patient is agreeable to proceed. Consent signed and in chart.     Thank you for this interesting consult.  I greatly enjoyed meeting [name] and look forward to participating in their care.  A copy of this report was sent to the requesting provider on this date.  Signed: D. Rowe Robert 07/30/2015, 12:58 PM   I spent a total of 30 minutes in face to face in clinical consultation, greater than 50% of which was counseling/coordinating care for CT guided right lung mass biopsy

## 2015-07-30 NOTE — Progress Notes (Signed)
Portable CXR done in room. 

## 2015-07-30 NOTE — Progress Notes (Signed)
Resting comfortably post lung biopsy. Coughed once and small amount clear sputum.

## 2015-08-02 NOTE — Progress Notes (Signed)
  Radiation Oncology         [phone] ________________________________  Name: [name] MRN: [id number]  Date: 08/03/2015  DOB: 1928/11/23  Chart Note:  This patient had a small pulmonary hemorrhage when he had CT biopsy on 9/19.      As a result, he is likely to have increased density around his tumor, obscuring the edges of the actual targeted nodule for the next few weeks.  Over time, the blood products usually dissipate.  So, I talked to his daughter [name] this evening.  I told her the biopsy result, and recommended we delay SBRT simulation for 2 weeks.  She was comfortable with that plan.  I will have our CT simulation staff cancel his 9/22 appointment and look 2 weeks later to make a new appointment for SBRT sim.       [name], MD [place] Director and Director of Stereotactic Radiosurgery Direct Dial: [phone]  Fax: [phone] Website: [email or web address]   Profile: [email or web address]

## 2015-08-03 ENCOUNTER — Ambulatory Visit: Admission: RE | Admit: 2015-08-03 | Payer: Medicare Other | Source: Ambulatory Visit | Admitting: Radiation Oncology

## 2015-08-03 ENCOUNTER — Ambulatory Visit
Admission: RE | Admit: 2015-08-03 | Discharge: 2015-08-03 | Disposition: A | Discharge status: Home / Self Care | Payer: Medicare Other | Source: Ambulatory Visit | Attending: Radiation Oncology | Admitting: Radiation Oncology

## 2015-08-08 ENCOUNTER — Telehealth: Payer: Self-pay | Admitting: Oncology

## 2015-08-08 ENCOUNTER — Ambulatory Visit (HOSPITAL_BASED_OUTPATIENT_CLINIC_OR_DEPARTMENT_OTHER): Payer: Medicare Other | Admitting: Oncology

## 2015-08-08 VITALS — BP 127/47 | HR 66 | Temp 98.1°F | Resp 18 | Ht 69.0 in | Wt 244.8 lb

## 2015-08-08 DIAGNOSIS — C642 Malignant neoplasm of left kidney, except renal pelvis: Secondary | ICD-10-CM

## 2015-08-08 DIAGNOSIS — C7801 Secondary malignant neoplasm of right lung: Secondary | ICD-10-CM | POA: Diagnosis not present

## 2015-08-08 DIAGNOSIS — C649 Malignant neoplasm of unspecified kidney, except renal pelvis: Secondary | ICD-10-CM

## 2015-08-08 NOTE — Progress Notes (Signed)
Hematology and Oncology Follow Up Visit  [name] [id number] 04-06-1929 81 y.o. 08/08/2015 8:34 AM [name], MDMcKeown, [name], MD   Principle Diagnosis: 81 year old gentleman diagnosed with renal cell carcinoma in June 2013. He had a T2a clear cell histology after presenting with a 9 cm left renal mass. Now he has documented pulmonary metastasis.   Prior Therapy: He presented with painless 9.6 x 9.2 x 7.8 cm left renal neoplasm with central necrosis suspicious for malignancy. He underwent a left laparoscopic radical nephrectomy on 04/29/2012 by Dr. Alinda Malone. 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 The margins were negative of tumor without any evidence of local lymphadenopathy. The final pathological staging was T2 1 N0.  He is status post biopsy of right lower lung nodule obtained on 07/30/2015 which confirmed the presence of renal cell carcinoma.  Current therapy: He is under evaluation for ablative radiation to that renal mass.  Interim History: [name] presents today for a follow-up visit. Since the last visit, he underwent percutaneous biopsy of his right lower lung nodule and have tolerated it well. He also was evaluated by Dr. Tammi Malone regarding radiation options. He did have some occasional dyspnea and cough with blood-tinged mucous since that time. But has not reported any back pain, flank pain or dyspnea on exertion. He is otherwise asymptomatic. His appetite and performance status is about the same. His mobility is limited but that is his baseline.  He does not report any headaches, blurry vision, syncope or seizures. He does not report any fevers, chills, sweats weight loss or appetite changes. He does not report any chest pain. He does not report any nausea, vomiting, abdominal pain, early satiety, change in his bowel habits or rectal bleeding. He does not report any frequency, urgency, hesitancy or  skeletal complaints. He does not report any lymphadenopathy or petechiae. He does report hearing deficits and the remaining review of system is unremarkable  Medications: I have reviewed the patient's current medications.  Current Outpatient Prescriptions  Medication Sig Dispense Refill  . allopurinol (ZYLOPRIM) 300 MG tablet Take 1 tablet (300 mg total) by mouth daily. (Patient taking differently: Take 150 mg by mouth daily. ) 90 tablet 4  . amLODipine (NORVASC) 5 MG tablet Take 1 tablet (5 mg total) by mouth daily before breakfast. 90 tablet 0  . aspirin EC 81 MG tablet Take 81 mg by mouth daily.    . bumetanide (BUMEX) 2 MG tablet Takes 1/2-1 pill daily for fluid (Patient taking differently: Take 1 mg by mouth daily. Takes 1/2-1 pill daily for fluid) 90 tablet 3  . Cholecalciferol (VITAMIN D3) 1000 UNITS CAPS Take 2,000 capsules by mouth daily.     . clotrimazole-betamethasone (LOTRISONE) cream Apply twice daily to affected skin 45 g 2  . diclofenac sodium (VOLTAREN) 1 % GEL Apply topically 4 (four) times daily.    . famotidine (PEPCID) 40 MG tablet Take 40 mg by mouth daily as needed. stomach    . FERROUS SULFATE PO Take 1 tablet by mouth 2 (two) times daily.     Marland Kitchen levothyroxine (SYNTHROID, LEVOTHROID) 200 MCG tablet Take 200 mcg by mouth daily before breakfast.    . losartan (COZAAR) 50 MG tablet Take 1 tablet (50 mg total) by mouth daily. 90 tablet 4  . Magnesium 250 MG TABS Take 500 mg by mouth daily.     . Multiple Vitamin (MULITIVITAMIN WITH MINERALS) TABS Take 1 tablet  by mouth daily.    . pravastatin (PRAVACHOL) 40 MG tablet Take 1 tablet (40 mg total) by mouth at bedtime. 90 tablet 4  . traMADol (ULTRAM) 50 MG tablet Take 1 tablet up to 3 x day if needed for severe pain 270 tablet 1  . vitamin C (ASCORBIC ACID) 500 MG tablet Take 500 mg by mouth 2 (two) times daily.    . nitroGLYCERIN (NITROSTAT) 0.4 MG SL tablet Place 0.4 mg under the tongue every 5 (five) minutes as needed for  chest pain.      No current facility-administered medications for this visit.     Allergies: No Known Allergies  Past Medical History, Surgical history, Social history, and Family History were reviewed and updated.   Physical Exam: Blood pressure 127/47, pulse 66, temperature 98.1 F (36.7 C), temperature source Oral, resp. rate 18, height '5\' 9"'$  (1.753 m), weight 244 lb 12.8 oz (111.041 kg), SpO2 99 %. ECOG: 1 General appearance: alert and cooperative elderly gentleman in a wheelchair. Head: Normocephalic, without obvious abnormality Neck: no adenopathy Lymph nodes: Cervical, supraclavicular, and axillary nodes normal. Heart:regular rate and rhythm, S1, S2 normal, no murmur, click, rub or gallop Lung:chest clear, no wheezing, rales, normal symmetric air entry Abdomin: soft, non-tender, without masses or organomegaly EXT:no erythema, induration, or nodules   Lab Results: Lab Results  Component Value Date   WBC 8.7 07/30/2015   HGB 11.4* 07/30/2015   HCT 35.4* 07/30/2015   MCV 98.6 07/30/2015   PLT 218 07/30/2015     Chemistry      Component Value Date/Time   NA 139 05/17/2015 1024   K 4.7 05/17/2015 1024   CL 104 05/17/2015 1024   CO2 23 05/17/2015 1024   BUN 48* 05/17/2015 1024   CREATININE 2.13* 05/17/2015 1024   CREATININE 2.11* 12/20/2013 1600      Component Value Date/Time   CALCIUM 8.7 05/17/2015 1024   ALKPHOS 79 05/17/2015 1024   AST 13 05/17/2015 1024   ALT 12 05/17/2015 1024   BILITOT 0.3 05/17/2015 1024      Impression and Plan:  81 year old gentleman with the following issues:  1. Renal cell carcinoma diagnosed in June 2013. He presented with a 9 cm mass and underwent a radical nephrectomy on June 2013. The final pathology showed clear cell renal cell carcinoma with the final pathological staging of T2a without any lymph node involvement. He remained disease-free with imaging studies up to August 2016. Now has stage IV disease.  Options of  treatment were reviewed again today including systemic therapy versus local therapy to his pulmonary nodule. Given his age and low volume metastasis, we have elected to proceed with local therapy and defer systemic therapy to a later date. He will require imaging studies of the chest in about 6 months or possibly before that to assess his status. If he develops metastatic disease in multiple areas, we will consider systemic therapy at that time.  He is not a candidate for surgical resection at this time.  2. 17 mm right lower lung nodule discovered on a CT scan on August 2016. This is biopsy proven to be metastatic renal cell carcinoma. Currently under evaluation for stereotactic ablative therapy in the care of Dr. Tammi Malone. As mentioned, surgical resection is not an option.  3. Follow-up: Will be in 3 months to check his clinical status. He will require repeat imaging studies in 6 months or sooner if he has any other symptomatology.     Zola Button, MD  9/28/20168:34 AM

## 2015-08-08 NOTE — Telephone Encounter (Signed)
Gave and printed appt sched and avs for DEC

## 2015-08-18 DIAGNOSIS — Z6833 Body mass index (BMI) 33.0-33.9, adult: Secondary | ICD-10-CM | POA: Insufficient documentation

## 2015-08-18 DIAGNOSIS — M109 Gout, unspecified: Secondary | ICD-10-CM | POA: Insufficient documentation

## 2015-08-18 NOTE — Patient Instructions (Signed)

## 2015-08-20 ENCOUNTER — Encounter: Payer: Self-pay | Admitting: Internal Medicine

## 2015-08-20 ENCOUNTER — Ambulatory Visit (INDEPENDENT_AMBULATORY_CARE_PROVIDER_SITE_OTHER): Payer: Medicare Other | Admitting: Internal Medicine

## 2015-08-20 VITALS — BP 134/68 | HR 64 | Temp 97.2°F | Resp 16 | Ht 68.0 in | Wt 245.6 lb

## 2015-08-20 DIAGNOSIS — Z91199 Patient's noncompliance with other medical treatment and regimen due to unspecified reason: Secondary | ICD-10-CM

## 2015-08-20 DIAGNOSIS — Z1212 Encounter for screening for malignant neoplasm of rectum: Secondary | ICD-10-CM

## 2015-08-20 DIAGNOSIS — R296 Repeated falls: Secondary | ICD-10-CM

## 2015-08-20 DIAGNOSIS — Z125 Encounter for screening for malignant neoplasm of prostate: Secondary | ICD-10-CM

## 2015-08-20 DIAGNOSIS — Z0000 Encounter for general adult medical examination without abnormal findings: Secondary | ICD-10-CM | POA: Diagnosis not present

## 2015-08-20 DIAGNOSIS — Z0001 Encounter for general adult medical examination with abnormal findings: Secondary | ICD-10-CM

## 2015-08-20 DIAGNOSIS — Z1389 Encounter for screening for other disorder: Secondary | ICD-10-CM | POA: Diagnosis not present

## 2015-08-20 DIAGNOSIS — Z23 Encounter for immunization: Secondary | ICD-10-CM

## 2015-08-20 DIAGNOSIS — M1000 Idiopathic gout, unspecified site: Secondary | ICD-10-CM

## 2015-08-20 DIAGNOSIS — E039 Hypothyroidism, unspecified: Secondary | ICD-10-CM

## 2015-08-20 DIAGNOSIS — Z79899 Other long term (current) drug therapy: Secondary | ICD-10-CM

## 2015-08-20 DIAGNOSIS — K219 Gastro-esophageal reflux disease without esophagitis: Secondary | ICD-10-CM

## 2015-08-20 DIAGNOSIS — I2510 Atherosclerotic heart disease of native coronary artery without angina pectoris: Secondary | ICD-10-CM

## 2015-08-20 DIAGNOSIS — N184 Chronic kidney disease, stage 4 (severe): Secondary | ICD-10-CM

## 2015-08-20 DIAGNOSIS — Z9119 Patient's noncompliance with other medical treatment and regimen: Secondary | ICD-10-CM

## 2015-08-20 DIAGNOSIS — Z6833 Body mass index (BMI) 33.0-33.9, adult: Secondary | ICD-10-CM

## 2015-08-20 DIAGNOSIS — R7303 Prediabetes: Secondary | ICD-10-CM

## 2015-08-20 DIAGNOSIS — I10 Essential (primary) hypertension: Secondary | ICD-10-CM

## 2015-08-20 DIAGNOSIS — Z9181 History of falling: Secondary | ICD-10-CM

## 2015-08-20 DIAGNOSIS — Z1331 Encounter for screening for depression: Secondary | ICD-10-CM

## 2015-08-20 DIAGNOSIS — E782 Mixed hyperlipidemia: Secondary | ICD-10-CM

## 2015-08-20 DIAGNOSIS — E559 Vitamin D deficiency, unspecified: Secondary | ICD-10-CM

## 2015-08-20 LAB — LIPID PANEL
Cholesterol: 145 mg/dL (ref 125–200)
HDL: 35 mg/dL — ABNORMAL LOW (ref >=40)
LDL Cholesterol: 95 mg/dL (ref <130)
Total CHOL/HDL Ratio: 4.1 Ratio (ref <=5.0)
Triglycerides: 76 mg/dL (ref <150)
VLDL: 15 mg/dL (ref <30)

## 2015-08-20 LAB — HEPATIC FUNCTION PANEL
ALBUMIN: 3.6 g/dL (ref 3.6–5.1)
ALT: 13 U/L (ref 9–46)
AST: 14 U/L (ref 10–35)
Alkaline Phosphatase: 70 U/L (ref 40–115)
Bilirubin, Direct: 0.1 mg/dL (ref <=0.2)
Indirect Bilirubin: 0.2 mg/dL (ref 0.2–1.2)
TOTAL PROTEIN: 6.2 g/dL (ref 6.1–8.1)
Total Bilirubin: 0.3 mg/dL (ref 0.2–1.2)

## 2015-08-20 LAB — BASIC METABOLIC PANEL WITH GFR
BUN: 45 mg/dL — AB (ref 7–25)
CHLORIDE: 105 mmol/L (ref 98–110)
CO2: 23 mmol/L (ref 20–31)
CREATININE: 2.2 mg/dL — AB (ref 0.70–1.11)
Calcium: 8.7 mg/dL (ref 8.6–10.3)
GFR, Est African American: 31 mL/min — ABNORMAL LOW (ref >=60)
GFR, Est Non African American: 27 mL/min — ABNORMAL LOW (ref >=60)
Glucose, Bld: 100 mg/dL — ABNORMAL HIGH (ref 65–99)
Potassium: 4.7 mmol/L (ref 3.5–5.3)
Sodium: 136 mmol/L (ref 135–146)

## 2015-08-20 LAB — CBC WITH DIFFERENTIAL/PLATELET
Basophils Absolute: 0.1 10*3/uL (ref 0.0–0.1)
Basophils Relative: 1 % (ref 0–1)
EOS ABS: 0.4 10*3/uL (ref 0.0–0.7)
Eosinophils Relative: 5 % (ref 0–5)
HCT: 32.9 % — ABNORMAL LOW (ref 39.0–52.0)
HEMOGLOBIN: 10.4 g/dL — AB (ref 13.0–17.0)
LYMPHS ABS: 2.7 10*3/uL (ref 0.7–4.0)
Lymphocytes Relative: 32 % (ref 12–46)
MCH: 30.7 pg (ref 26.0–34.0)
MCHC: 31.6 g/dL (ref 30.0–36.0)
MCV: 97.1 fL (ref 78.0–100.0)
MONO ABS: 0.9 10*3/uL (ref 0.1–1.0)
MONOS PCT: 11 % (ref 3–12)
MPV: 10.1 fL (ref 8.6–12.4)
NEUTROS ABS: 4.3 10*3/uL (ref 1.7–7.7)
NEUTROS PCT: 51 % (ref 43–77)
Platelets: 233 10*3/uL (ref 150–400)
RBC: 3.39 MIL/uL — ABNORMAL LOW (ref 4.22–5.81)
RDW: 14.9 % (ref 11.5–15.5)
WBC: 8.4 10*3/uL (ref 4.0–10.5)

## 2015-08-20 LAB — MAGNESIUM: MAGNESIUM: 2.4 mg/dL (ref 1.5–2.5)

## 2015-08-20 LAB — URIC ACID: Uric Acid, Serum: 4.6 mg/dL (ref 4.0–7.8)

## 2015-08-20 LAB — HEMOGLOBIN A1C
HEMOGLOBIN A1C: 6 % — AB (ref <5.7)
MEAN PLASMA GLUCOSE: 126 mg/dL — AB (ref <117)

## 2015-08-20 MED ORDER — AMLODIPINE BESYLATE 5 MG PO TABS: 5.0000 mg | ORAL_TABLET | Freq: Every day | ORAL | Status: DC

## 2015-08-20 NOTE — Progress Notes (Addendum)
Patient ID: [name], male   DOB: 1929/03/29, 81 y.o.   MRN: [id number]  Medicare Annual Preventative Visit And  Comprehensive Evaluation,  Examination & Management   This very nice 81 y.o. [name] presents for presents for a M/C Annual Preventative  Visit & comprehensive evaluation, examination and management of multiple medical co-morbidities.  Patient has been followed for HTN, ASCAD,  Prediabetes, Hyperlipidemia, Gout  and Vitamin D Deficiency. Patient also has a RCC initially Dx's in June 2013 and treated with Lt Nephrectomy. Recently he was found to have a Met recurrence in the Rt lung and is being scheduled for focused radiation therapy to the lesion. Patient's Oncologist is Dr Alen Blew.   HTN predates since 75 & in 2009 he had PTCA and a Stent.  Pt's Cardiologist is Dr Wynonia Lawman. Patient's BP has been controlled at home.Today's BP: 134/68 mmHg.Patienty has CKD 4 (GFR 28 ml/min). Patient is very sedentary - usu uses a walker & occasionally  Short walks with a walker.  Patient denies any cardiac symptoms as chest pain, palpitations, shortness of breath, dizziness or ankle swelling.   Patient's hyperlipidemia is controlled with diet and medications. Patient denies myalgias or other medication SE's. Last lipids were at goal with Cholesterol 154; HDL 38*; LDL 99; Triglycerides 84 on 05/17/2015.      Patient is Morbidly Obesity (BMI 33.58) and non compliant with recommendations for improved diet & weight loss and thus has consequent prediabetes since 2006  and patient denies reactive hypoglycemic symptoms, visual blurring, diabetic polys or paresthesias. Last A1c was 6.0% on 05/17/2015.   Finally, patient has history of Vitamin D Deficiency of 24 in 2008 and last vitamin D was 60 on 05/17/2015.   Medication Sig  . allopurinol  300 MG tablet Take 1/2 tablet (300 mg total) by mouth daily.  Marland Kitchen amLODipine  5 MG tablet Take 1 tablet (5 mg total) by mouth daily before breakfast.  . aspirin EC 81 MG tablet Take  81 mg by mouth daily.  . bumetanide  2 MG tablet Takes 1/2-1 pill daily for fluid   . VITAMIN D 1000 UNITS CAPS Take 2,000 capsules by mouth daily.   Marland Kitchen LOTRISONE cream Apply twice daily to affected skin  . diclofenac  (VOLTAREN) 1 % GEL Apply topically 4 (four) times daily.  . famotidine (PEPCID) 40 MG tablet Take 40 mg by mouth daily as needed. stomach  . FERROUS SULFATE PO Take 1 tablet by mouth 2 (two) times daily.   Marland Kitchen levothyroxine  200 MCG tablet Take 200 mcg by mouth daily before breakfast.  . losartan (COZAAR) 50 MG tablet Take 1 tablet (50 mg total) by mouth daily.  . Magnesium 250 MG TABS Take 500 mg by mouth daily.   . Multiple Vitamin w/ MINERALS Take 1 tablet by mouth daily.  Marland Kitchen NITROSTAT 0.4 MG SL tablet Place 0.4 mg under the tongue every 5 (five) minutes as needed for chest pain.   . pravastatin  40 MG tablet Take 1 tablet (40 mg total) by mouth at bedtime.  . traMADol  50 MG tablet Take 1 tablet up to 3 x day if needed for severe pain  . vitamin C  500 MG tablet Take 500 mg by mouth 2 (two) times daily.   No Known Allergies   Past Medical History  Diagnosis Date  . Hard of hearing   . Coronary artery disease   . Hearing deficit     hearing aids- bilateral  . Anemia   .  Arthritis     stenosis - back, neck  . Cancer [facility])     renal neoplasm  . GERD (gastroesophageal reflux disease)   . Hypertension     sees Dr. Wynonia Lawman, stress test recently for prep for surgery  . Chronic kidney disease     renalCa- nephrectomy [facility]) 04/2012  . Hypothyroidism   . Vitamin D deficiency   . Prediabetes   . 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 INFLUENZA VACCINE  06/11/2015  . TETANUS/TDAP  02/17/2022  . ZOSTAVAX  Completed  . PNA vac Low Risk Adult  Completed   Immunization History  Administered Date(s) Administered  . Influenza Split 09/06/2013  . Influenza, High Dose  Seasonal PF 07/20/2014  . Pneumococcal Conjugate-13 10/30/2014  . Pneumococcal Polysaccharide-23 02/18/2012  . Td 02/18/2012  . Zoster 03/03/2013   Past Surgical History  Procedure Laterality Date  . Joint replacement      bilateral knee replacements   . Cholecystectomy    . Tonsillectomy    . Appendectomy    . Eye surgery      right eye cataract surgery   . Laparoscopic nephrectomy  04/29/2012    Procedure: LAPAROSCOPIC NEPHRECTOMY;  Surgeon: Dutch Gray, MD;  Location: [facility] ORS;  Service: Urology;  Laterality: Left;      . Coronary angioplasty      stents , + 7-83yr. ago  . Back surgery      lower back surgery - 1990's  . Anterior cervical decomp/discectomy fusion N/A 01/18/2013    Procedure: ANTERIOR CERVICAL DECOMPRESSION/DISCECTOMY FUSION 2 LEVELS;  Surgeon: RFaythe Ghee MD;  Location: [facility] NEURO ORS;  Service: Neurosurgery;  Laterality: N/A;   Family History  Problem Relation Age of Onset  . Cancer Sister     breast  . Cancer Brother     colon   Social History   Social History  . Marital Status: Widowed    Spouse Name: N/A  . Number of Children: N/A  . Years of Education: N/A   Occupational History  . Retired [employer]   Social History Main Topics  . Smoking status: Former Smoker    Types: Cigarettes    Quit date: 11/10/1970  . Smokeless tobacco: Never Used  . Alcohol Use: 6.6 oz/week    2 Cans of beer, 9 Shots of liquor per week     Comment: friday- beer & 3 drambuie  . Drug Use: No  . Sexual Activity: No    ROS Constitutional: Denies fever, chills, weight loss/gain, headaches, insomnia,  night sweats or change in appetite. Does c/o fatigue. Eyes: Denies redness, blurred vision, diplopia, discharge, itchy or watery eyes.  ENT: Denies discharge, congestion, post nasal drip, epistaxis, sore throat, earache, dental pain, Tinnitus, Vertigo, Sinus pain or snoring. Uses a hearing aid for amplification due to hearing loss. Cardio: Denies chest pain,  palpitations, irregular heartbeat, syncope, dyspnea, diaphoresis, orthopnea, PND, claudication or edema Respiratory: denies cough, dyspnea, DOE, pleurisy, hoarseness, laryngitis or wheezing.  Gastrointestinal: Denies dysphagia, heartburn, reflux, water brash, pain, cramps, nausea, vomiting, bloating, diarrhea, constipation, hematemesis, melena, hematochezia, jaundice or hemorrhoids Genitourinary: Denies dysuria, frequency, urgency, nocturia, hesitancy, discharge, hematuria or flank pain Musculoskeletal: Denies arthralgia, myalgia, stiffness, Jt. Swelling, pain, limp or strain/sprain. Denies Falls and does use a walker at home and a wheel chair when out on visits. Skin: Denies puritis, rash, hives,  warts, acne, eczema or change in skin lesion Neuro: No weakness, tremor, incoordination, spasms, paresthesia or pain Psychiatric: Denies confusion, memory loss or sensory loss. Denies Depression. Endocrine: Denies change in weight, skin, hair change, nocturia, and paresthesia, diabetic polys, visual blurring or hyper / hypo glycemic episodes.  Heme/Lymph: No excessive bleeding, bruising or enlarged lymph nodes.  Physical Exam  BP 134/68 mmHg  Pulse 64  Temp(Src) 97.2 F (36.2 C)  Resp 16  Ht 5' 8" (1.727 m)  Wt 245 lb 9.6 oz (111.403 kg)  BMI 37.35 kg/m2  General Appearance: Well nourished &  in no apparent distress. Eyes: PERRLA, EOMs, conjunctiva no swelling or erythema, normal fundi and vessels. Sinuses: No frontal/maxillary tenderness ENT/Mouth: EACs patent / TMs  nl. Nares clear without erythema, swelling, mucoid exudates. Oral hygiene is good. No erythema, swelling, or exudate. Tongue normal, non-obstructing. Tonsils not swollen or erythematous. Hearing normal.  Neck: Supple, thyroid normal. No bruits, nodes or JVD. Respiratory: Respiratory effort normal.  BS equal and clear bilateral without rales, rhonci, wheezing or stridor. Cardio: Heart sounds are normal with regular rate and rhythm  and no murmurs, rubs or gallops. Peripheral pulses are normal and equal bilaterally without edema. No aortic or femoral bruits. Chest: symmetric with normal excursions and percussion.  Abdomen: Soft & rotund with Nl bowel sounds. Nontender, no guarding, rebound, hernias, masses, or organomegaly.  Lymphatics: Non tender without lymphadenopathy.  Genitourinary: Deferred to [employer].  Musculoskeletal: Full ROM all peripheral extremities, gait unstable with patient in wheelchair. Skin: Warm and dry without rashes, lesions, cyanosis, clubbing or  ecchymosis.  Neuro: Cranial nerves intact, reflexes equal bilaterally. Normal muscle tone, no cerebellar symptoms. Sensation intact.  Pysch: Alert and oriented X 3 with normal affect, insight and judgment appropriate.   Assessment and Plan  1. Encounter for general adult medical examination with abnormal findings  - [place], RETROPERITNL ABD,  LTD  2. Essential hypertension  - Microalbumin / creatinine urine ratio - EKG 12-Lead - TSH  3. Hyperlipidemia  - Lipid panel  4. Prediabetes  - Hemoglobin A1c - Insulin, random  5. Vitamin D deficiency  - Vit D  25 hydroxy   6. Hypothyroidism  - TSH  7. Gastroesophageal reflux disease   8. CKD, stage IV (GFR 28 ml/min) (HCC)   9. Coronary artery disease involving native coronary artery of native heart without angina pectoris   10. Idiopathic gout  - Uric acid  11. Depression screen   12. At low risk for fall   13. Screening for rectal cancer  - POC Hemoccult Bld/Stl   14. Prostate cancer screening  - PSA  15. Medication management  - Urinalysis, Routine w reflex microscopic - CBC with Differential/Platelet - BASIC METABOLIC PANEL WITH GFR - Hepatic function panel - Magnesium  16. BMI 33.58,  adult   Continue prudent diet as discussed, weight control, BP monitoring, regular exercise, and medications as discussed.  Discussed med effects and SE's. Routine screening labs  and tests as requested with regular follow-up as recommended.  Over 40 minutes of exam, counseling &  chart review was performed

## 2015-08-21 LAB — INSULIN, RANDOM: INSULIN: 5.2 u[IU]/mL (ref 2.0–19.6)

## 2015-08-21 LAB — TSH: TSH: 4.002 u[IU]/mL (ref 0.350–4.500)

## 2015-08-21 LAB — PSA: PSA: 4.59 ng/mL — AB (ref <=4.00)

## 2015-08-21 LAB — VITAMIN D 25 HYDROXY (VIT D DEFICIENCY, FRACTURES): VIT D 25 HYDROXY: 63 ng/mL (ref 30–100)

## 2015-08-27 ENCOUNTER — Ambulatory Visit
Admission: RE | Admit: 2015-08-27 | Discharge: 2015-08-27 | Disposition: A | Discharge status: Home / Self Care | Payer: Medicare Other | Source: Ambulatory Visit | Attending: Radiation Oncology | Admitting: Radiation Oncology

## 2015-08-27 DIAGNOSIS — Z510 Encounter for antineoplastic radiation therapy: Secondary | ICD-10-CM | POA: Diagnosis not present

## 2015-08-27 DIAGNOSIS — C7801 Secondary malignant neoplasm of right lung: Secondary | ICD-10-CM | POA: Insufficient documentation

## 2015-08-27 NOTE — Progress Notes (Signed)
  Radiation Oncology         [phone] ________________________________  Name: [name] MRN: [id number]  Date: 08/27/2015  DOB: 10/02/1929  SIMULATION AND TREATMENT PLANNING NOTE    ICD-9-CM ICD-10-CM   1. Secondary malignant neoplasm of right lung (HCC) 197.0 C78.01     DIAGNOSIS: 81 yo gentleman with a solitary isolated lung metastasis to the right lower lung from renal cell carcinoma  NARRATIVE:  The patient was brought to the [facility].  Identity was confirmed.  All relevant records and images related to the planned course of therapy were reviewed.  The patient freely provided informed written consent to proceed with treatment after reviewing the details related to the planned course of therapy. The consent form was witnessed and verified by the simulation staff.  Then, the patient was set-up in a stable reproducible  supine position for radiation therapy.  A BodyFix immobilization pillow was fabricated for reproducible positioning.    Then, the spirometer was used for DIBH and static target position was verified with sequential CT images.    Surface markings were placed.  The CT images were loaded into the planning software.  Then, the internal target volume (ITV) and planning target volumes (PTV) were delinieated, and avoidance structures were contoured.  Treatment planning then occurred.  The radiation prescription was entered and confirmed.  A total of two complex treatment devices were fabricated in the form of the BodyFix immobilization pillow and a neck accuform cushion.  I have requested : 3D Simulation  I have requested a DVH of the following structures: Heart, Lungs, Esophagus, Chest Wall, Brachial Plexus, Major Blood Vessels, and targets.  PLAN:  The patient will receive 54 Gy in 3 fractions.  This document serves as a record of services personally performed by [name], MD. It was created on his behalf by Darcus Austin, a trained medical scribe.  The creation of this record is based on the scribe's personal observations and the provider's statements to them. This document has been checked and approved by the attending provider.  ________________________________  Sheral Apley. Tammi Klippel, M.D.

## 2015-08-30 DIAGNOSIS — Z510 Encounter for antineoplastic radiation therapy: Secondary | ICD-10-CM | POA: Diagnosis not present

## 2015-09-07 ENCOUNTER — Ambulatory Visit
Admission: RE | Admit: 2015-09-07 | Discharge: 2015-09-07 | Disposition: A | Discharge status: Home / Self Care | Payer: Medicare Other | Source: Ambulatory Visit | Attending: Radiation Oncology | Admitting: Radiation Oncology

## 2015-09-07 ENCOUNTER — Encounter: Payer: Self-pay | Admitting: Radiation Oncology

## 2015-09-07 VITALS — BP 156/59 | HR 76 | Resp 16

## 2015-09-07 DIAGNOSIS — Z510 Encounter for antineoplastic radiation therapy: Secondary | ICD-10-CM | POA: Diagnosis not present

## 2015-09-07 DIAGNOSIS — C7801 Secondary malignant neoplasm of right lung: Secondary | ICD-10-CM

## 2015-09-07 NOTE — Progress Notes (Signed)
Department of Radiation Oncology  Phone:  [phone] Fax:        [phone]  Weekly Treatment Note    Name: [name] Date: 09/07/2015 MRN: [id number] DOB: 1928-11-18   Current dose: 18 Gy  Current fraction: 1   MEDICATIONS: Current Outpatient Prescriptions  Medication Sig Dispense Refill  . allopurinol (ZYLOPRIM) 300 MG tablet Take 1 tablet (300 mg total) by mouth daily. (Patient taking differently: Take 150 mg by mouth daily. ) 90 tablet 4  . amLODipine (NORVASC) 5 MG tablet Take 1 tablet (5 mg total) by mouth daily before breakfast. 90 tablet 3  . aspirin EC 81 MG tablet Take 81 mg by mouth daily.    . bumetanide (BUMEX) 2 MG tablet Takes 1/2-1 pill daily for fluid (Patient taking differently: Take 1 mg by mouth daily. Takes 1/2-1 pill daily for fluid) 90 tablet 3  . Cholecalciferol (VITAMIN D3) 1000 UNITS CAPS Take 2,000 capsules by mouth daily.     . diclofenac sodium (VOLTAREN) 1 % GEL Apply topically 4 (four) times daily.    . famotidine (PEPCID) 40 MG tablet Take 40 mg by mouth daily as needed. stomach    . FERROUS SULFATE PO Take 1 tablet by mouth 2 (two) times daily.     Marland Kitchen levothyroxine (SYNTHROID, LEVOTHROID) 200 MCG tablet Take 200 mcg by mouth daily before breakfast.    . losartan (COZAAR) 50 MG tablet Take 1 tablet (50 mg total) by mouth daily. 90 tablet 4  . Magnesium 250 MG TABS Take 500 mg by mouth daily.     . Multiple Vitamin (MULITIVITAMIN WITH MINERALS) TABS Take 1 tablet by mouth daily.    . nitroGLYCERIN (NITROSTAT) 0.4 MG SL tablet Place 0.4 mg under the tongue every 5 (five) minutes as needed for chest pain.     . pravastatin (PRAVACHOL) 40 MG tablet Take 1 tablet (40 mg total) by mouth at bedtime. 90 tablet 4  . traMADol (ULTRAM) 50 MG tablet Take 1 tablet up to 3 x day if needed for severe pain 270 tablet 1  . vitamin C (ASCORBIC ACID) 500 MG tablet Take 500 mg by mouth 2 (two) times daily.     No current facility-administered medications  for this encounter.     ALLERGIES: Review of patient's allergies indicates no known allergies.   LABORATORY DATA:  Lab Results  Component Value Date   WBC 8.4 08/20/2015   HGB 10.4* 08/20/2015   HCT 32.9* 08/20/2015   MCV 97.1 08/20/2015   PLT 233 08/20/2015   Lab Results  Component Value Date   NA 136 08/20/2015   K 4.7 08/20/2015   CL 105 08/20/2015   CO2 23 08/20/2015   Lab Results  Component Value Date   ALT 13 08/20/2015   AST 14 08/20/2015   ALKPHOS 70 08/20/2015   BILITOT 0.3 08/20/2015     NARRATIVE: [name] was seen today for weekly treatment management. The chart was checked and the patient's films were reviewed.  Vitals stable. Patient unable to stand for weight. Denies pain. Denies cough, shortness of breath or dyspnea. Denies a sore throat or difficulty swallowing. No skin changes within treatment field. Denies fatigue.   BP 156/59 mmHg  Pulse 76  Resp 16  Wt   SpO2 100% Wt Readings from Last 3 Encounters:  08/20/15 245 lb 9.6 oz (111.403 kg)  08/08/15 244 lb 12.8 oz (111.041 kg)  07/30/15 230 lb (104.327 kg)     PHYSICAL  EXAMINATION: blood pressure is 156/59 and his pulse is 76. His respiration is 16 and oxygen saturation is 100%.        ASSESSMENT: The patient is doing satisfactorily with treatment.  PLAN: We will continue with the patient's radiation treatment as planned.

## 2015-09-07 NOTE — Progress Notes (Signed)
Vitals stable. Patient unable to stand for weight. Denies pain. Denies cough, shortness of breath or dyspnea. Denies a sore throat or difficulty swallowing. No skin changes within treatment field. Denies fatigue.   BP 156/59 mmHg  Pulse 76  Resp 16  Wt   SpO2 100% Wt Readings from Last 3 Encounters:  08/20/15 245 lb 9.6 oz (111.403 kg)  08/08/15 244 lb 12.8 oz (111.041 kg)  07/30/15 230 lb (104.327 kg)

## 2015-09-11 ENCOUNTER — Ambulatory Visit
Admission: RE | Admit: 2015-09-11 | Discharge: 2015-09-11 | Disposition: A | Discharge status: Home / Self Care | Payer: Medicare Other | Source: Ambulatory Visit | Attending: Radiation Oncology | Admitting: Radiation Oncology

## 2015-09-11 DIAGNOSIS — Z510 Encounter for antineoplastic radiation therapy: Secondary | ICD-10-CM | POA: Diagnosis not present

## 2015-09-13 ENCOUNTER — Encounter: Payer: Self-pay | Admitting: Radiation Oncology

## 2015-09-14 ENCOUNTER — Encounter: Payer: Self-pay | Admitting: Radiation Oncology

## 2015-09-14 ENCOUNTER — Ambulatory Visit
Admission: RE | Admit: 2015-09-14 | Discharge: 2015-09-14 | Disposition: A | Discharge status: Home / Self Care | Payer: Medicare Other | Source: Ambulatory Visit | Attending: Radiation Oncology | Admitting: Radiation Oncology

## 2015-09-14 DIAGNOSIS — Z510 Encounter for antineoplastic radiation therapy: Secondary | ICD-10-CM | POA: Diagnosis not present

## 2015-09-14 DIAGNOSIS — C7801 Secondary malignant neoplasm of right lung: Secondary | ICD-10-CM

## 2015-09-14 NOTE — Progress Notes (Signed)
  Radiation Oncology         [phone] ________________________________  Name: [name] MRN: [id number]  Date: 09/14/2015  DOB: [birth year]    Weekly Radiation Therapy Management    ICD-9-CM ICD-10-CM   1. Solitary Right Lower Lung Metastasis from Renal Cell Carcinoma 197.0 C78.01     Current Dose: 54 Gy     Planned Dose:  54 Gy  Narrative . . . . . . . . The patient presents for the final under treatment assessment.                                            The patient has had some continuation of previously noted symptoms.                                  Set-up films were reviewed.                                 The chart was checked. Physical Findings. . . Weight essentially stable.  No significant changes. Impression . . . . . . . The patient tolerated radiation relatively well. Plan . . . . . . . . . . . . Complete radiation today as scheduled, and follow-up in one month. The patient was encouraged to call or return to the clinic in the interim for any worsening symptoms.  ________________________________  Sheral Apley Tammi Klippel, M.D.  This document serves as a record of services personally performed by [name], MD. It was created on his behalf by Arlyce Harman, a trained medical scribe. The creation of this record is based on the scribe's personal observations and the provider's statements to them. This document has been checked and approved by the attending provider.

## 2015-10-06 NOTE — Progress Notes (Signed)
  Radiation Oncology         [phone] ________________________________  Name: [name] MRN: [id number]  Date: 09/14/2015  DOB: [birth year]  End of Treatment Note   ICD-9-CM ICD-10-CM    1. Secondary malignant neoplasm of right lung (HCC) 197.0 C78.01     DIAGNOSIS: 81 yo gentleman with a solitary isolated lung metastasis to the right lower lung from renal cell carcinoma     Indication for treatment:  Curative, Definitive SBRT       Radiation treatment dates:   09/07/2015, 09/11/2015, 09/14/2015  Site/dose:   The right lower lung was treated to 54 Gy in 3 fractions of 18 Gy  Beams/energy:   The patient was treated using stereotactic body radiotherapy according to a 3D conformal radiotherapy plan.  Volumetric arc fields were employed to deliver 6 MV X-rays.  Image guidance was performed with per fraction cone beam CT prior to treatment under personal MD supervision.  Immobilization was achieved using BodyFix Pillow.  Narrative: The patient tolerated radiation treatment relatively well.   He denies sore throat, difficulty swallowing, dyspnea, skin changes, and fatigue. He did not experience any symptoms from radiation treatment.  Plan: The patient has completed radiation treatment. The patient will return to radiation oncology clinic for routine followup in one month. I advised them to call or return sooner if they have any questions or concerns related to their recovery or treatment. ________________________________  Sheral Apley. Tammi Klippel, M.D.   This document serves as a record of services personally performed by [name], MD. It was created on his behalf by Arlyce Harman, a trained medical scribe. The creation of this record is based on the scribe's personal observations and the provider's statements to them. This document has been checked and approved by the attending provider.

## 2015-10-17 ENCOUNTER — Telehealth: Payer: Self-pay | Admitting: Oncology

## 2015-10-17 ENCOUNTER — Ambulatory Visit (HOSPITAL_BASED_OUTPATIENT_CLINIC_OR_DEPARTMENT_OTHER): Payer: Medicare Other | Admitting: Oncology

## 2015-10-17 VITALS — BP 142/58 | HR 76 | Temp 98.0°F | Resp 18 | Ht 68.0 in | Wt 249.9 lb

## 2015-10-17 DIAGNOSIS — C642 Malignant neoplasm of left kidney, except renal pelvis: Secondary | ICD-10-CM

## 2015-10-17 DIAGNOSIS — C649 Malignant neoplasm of unspecified kidney, except renal pelvis: Secondary | ICD-10-CM

## 2015-10-17 DIAGNOSIS — C7801 Secondary malignant neoplasm of right lung: Secondary | ICD-10-CM | POA: Diagnosis not present

## 2015-10-17 NOTE — Telephone Encounter (Signed)
Gave and printed appt sched and avs fo rpt for March...the patient only has one kidney

## 2015-10-17 NOTE — Progress Notes (Signed)
Hematology and Oncology Follow Up Visit  [name] [id number] [birth year] 81 y.o. 10/17/2015 9:02 AM [name], MDMcKeown, Chad Saxon, MD   Principle Diagnosis: 81 year old gentleman diagnosed with renal cell carcinoma in June 2013. He had a T2a clear cell histology after  presented with painless 9.6 x 9.2 x 7.8 cm left renal neoplasm with central necrosis suspicious for malignancy. He has documented pulmonary metastasis.   Prior Therapy:   He underwent a left laparoscopic radical nephrectomy on 04/29/2012 by Dr. Alinda Malone. 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 The margins were negative of tumor without any evidence of local lymphadenopathy. The final pathological staging was T2 1 N0.  He is status post biopsy of right lower lung nodule obtained on 07/30/2015 which confirmed the presence of renal cell carcinoma.  He is status post radiation therapy to an isolated lung metastasis in the right lower lobe. He received total 54 gray in 3 fractions on 10/08/2015, 09/11/2015 and 09/14/2015.  Current therapy: He is on observation and surveillance.  Interim History: Mr.[name] presents today for a follow-up visit. Since the last visit, he underwent radiation therapy for his isolated pulmonary metastasis and have tolerated it well. He reports no delayed complications at this time. He denied any dyspnea on exertion, cough or hemoptysis. His performance status is quite limited but unchanged since the last visit. His appetite have been excellent and have gained close to 15 pounds. Has no other complaints otherwise. Has not reported any hematuria or dysuria. His quality of life is unchanged and remain reasonable.  He does not report any headaches, blurry vision, syncope or seizures. He does not report any fevers, chills, sweats weight loss or appetite changes. He does not report any chest pain. He does not report any nausea,  vomiting, abdominal pain, early satiety, change in his bowel habits or rectal bleeding. He does not report any frequency, urgency, hesitancy or skeletal complaints. He does not report any lymphadenopathy or petechiae. He does report hearing deficits and the remaining review of system is unremarkable  Medications: I have reviewed the patient's current medications.  Current Outpatient Prescriptions  Medication Sig Dispense Refill  . allopurinol (ZYLOPRIM) 300 MG tablet Take 1 tablet (300 mg total) by mouth daily. (Patient taking differently: Take 150 mg by mouth daily. ) 90 tablet 4  . amLODipine (NORVASC) 5 MG tablet Take 1 tablet (5 mg total) by mouth daily before breakfast. 90 tablet 3  . aspirin EC 81 MG tablet Take 81 mg by mouth daily.    . bumetanide (BUMEX) 2 MG tablet Takes 1/2-1 pill daily for fluid (Patient taking differently: Take 1 mg by mouth daily. Takes 1/2-1 pill daily for fluid) 90 tablet 3  . Cholecalciferol (VITAMIN D3) 1000 UNITS CAPS Take 2,000 capsules by mouth daily.     . diclofenac sodium (VOLTAREN) 1 % GEL Apply topically 4 (four) times daily.    . famotidine (PEPCID) 40 MG tablet Take 40 mg by mouth daily as needed. stomach    . FERROUS SULFATE PO Take 1 tablet by mouth 2 (two) times daily.     Marland Kitchen levothyroxine (SYNTHROID, LEVOTHROID) 200 MCG tablet Take 200 mcg by mouth daily before breakfast.    . losartan (COZAAR) 50 MG tablet Take 1 tablet (50 mg total) by mouth daily. 90 tablet 4  . Magnesium 250 MG TABS Take 500 mg by mouth daily.     . Multiple Vitamin (MULITIVITAMIN  WITH MINERALS) TABS Take 1 tablet by mouth daily.    . nitroGLYCERIN (NITROSTAT) 0.4 MG SL tablet Place 0.4 mg under the tongue every 5 (five) minutes as needed for chest pain.     . pravastatin (PRAVACHOL) 40 MG tablet Take 1 tablet (40 mg total) by mouth at bedtime. 90 tablet 4  . traMADol (ULTRAM) 50 MG tablet Take 1 tablet up to 3 x day if needed for severe pain 270 tablet 1  . vitamin C (ASCORBIC  ACID) 500 MG tablet Take 500 mg by mouth 2 (two) times daily.     No current facility-administered medications for this visit.     Allergies: No Known Allergies  Past Medical History, Surgical history, Social history, and Family History were reviewed and updated.   Physical Exam: Blood pressure 142/58, pulse 76, temperature 98 F (36.7 C), temperature source Oral, resp. rate 18, height '5\' 8"'$  (1.727 m), weight 249 lb 14.4 oz (113.354 kg), SpO2 98 %. ECOG: 2 General appearance: alert and cooperative elderly gentleman in a wheelchair. Hard of hearing. Head: Normocephalic, without obvious abnormality no oral ulcers or lesions. Neck: no adenopathy Lymph nodes: Cervical, supraclavicular, and axillary nodes normal. Heart:regular rate and rhythm, S1, S2 normal, no murmur, click, rub or gallop Lung:chest clear, no wheezing, rales, normal symmetric air entry. Scattered rhonchi noted. Abdomin: soft, non-tender, without masses or organomegaly shifting dullness or ascites. EXT:no erythema, induration, or nodules   Lab Results: Lab Results  Component Value Date   WBC 8.4 08/20/2015   HGB 10.4* 08/20/2015   HCT 32.9* 08/20/2015   MCV 97.1 08/20/2015   PLT 233 08/20/2015     Chemistry      Component Value Date/Time   NA 136 08/20/2015 1319   K 4.7 08/20/2015 1319   CL 105 08/20/2015 1319   CO2 23 08/20/2015 1319   BUN 45* 08/20/2015 1319   CREATININE 2.20* 08/20/2015 1319   CREATININE 2.11* 12/20/2013 1600      Component Value Date/Time   CALCIUM 8.7 08/20/2015 1319   ALKPHOS 70 08/20/2015 1319   AST 14 08/20/2015 1319   ALT 13 08/20/2015 1319   BILITOT 0.3 08/20/2015 1319      Impression and Plan:  81 year old gentleman with the following issues:  1. Renal cell carcinoma diagnosed in June 2013. He presented with a 9 cm mass and underwent a radical nephrectomy on June 2013. The final pathology showed clear cell renal cell carcinoma with the final pathological staging of T2a  without any lymph node involvement. He remained disease-free with imaging studies up to August 2016. Now has stage IV disease.  He is status post radiation therapy to an isolated pulmonary metastasis completed in November 2016. The plan is to continue with observation and surveillance of this time and repeat imaging studies in 3 months. Depending on these findings and next course of action will be determined. Systemic therapy can be utilized but he is a marginal candidate for given his poor performance status and advanced age. Certainly it would be an option if he has widespread metastatic disease at that time.   2. 17 mm right lower lung nodule discovered on a CT scan on August 2016. This is biopsy proven to be metastatic renal cell carcinoma. Status post ablative therapy by radiation completed in November 2016.  3. Follow-up: Will be in 3 months and repeat CT scan at that time.     [facility], MD 12/7/20169:02 AM

## 2015-11-17 ENCOUNTER — Other Ambulatory Visit: Payer: Self-pay | Admitting: Internal Medicine

## 2015-11-23 ENCOUNTER — Encounter: Payer: Self-pay | Admitting: Physician Assistant

## 2015-11-23 ENCOUNTER — Ambulatory Visit (INDEPENDENT_AMBULATORY_CARE_PROVIDER_SITE_OTHER): Payer: Medicare Other | Admitting: Physician Assistant

## 2015-11-23 ENCOUNTER — Other Ambulatory Visit: Payer: Self-pay

## 2015-11-23 VITALS — BP 120/66 | HR 76 | Temp 98.1°F | Resp 16 | Ht 68.0 in | Wt 251.0 lb

## 2015-11-23 DIAGNOSIS — Z9119 Patient's noncompliance with other medical treatment and regimen: Secondary | ICD-10-CM

## 2015-11-23 DIAGNOSIS — I2510 Atherosclerotic heart disease of native coronary artery without angina pectoris: Secondary | ICD-10-CM

## 2015-11-23 DIAGNOSIS — R6889 Other general symptoms and signs: Secondary | ICD-10-CM | POA: Diagnosis not present

## 2015-11-23 DIAGNOSIS — M4800 Spinal stenosis, site unspecified: Secondary | ICD-10-CM

## 2015-11-23 DIAGNOSIS — E782 Mixed hyperlipidemia: Secondary | ICD-10-CM

## 2015-11-23 DIAGNOSIS — I10 Essential (primary) hypertension: Secondary | ICD-10-CM

## 2015-11-23 DIAGNOSIS — Z79899 Other long term (current) drug therapy: Secondary | ICD-10-CM

## 2015-11-23 DIAGNOSIS — M1000 Idiopathic gout, unspecified site: Secondary | ICD-10-CM

## 2015-11-23 DIAGNOSIS — Z0001 Encounter for general adult medical examination with abnormal findings: Secondary | ICD-10-CM

## 2015-11-23 DIAGNOSIS — E559 Vitamin D deficiency, unspecified: Secondary | ICD-10-CM

## 2015-11-23 DIAGNOSIS — C7801 Secondary malignant neoplasm of right lung: Secondary | ICD-10-CM

## 2015-11-23 DIAGNOSIS — C649 Malignant neoplasm of unspecified kidney, except renal pelvis: Secondary | ICD-10-CM | POA: Diagnosis not present

## 2015-11-23 DIAGNOSIS — Z91199 Patient's noncompliance with other medical treatment and regimen due to unspecified reason: Secondary | ICD-10-CM

## 2015-11-23 DIAGNOSIS — E039 Hypothyroidism, unspecified: Secondary | ICD-10-CM | POA: Diagnosis not present

## 2015-11-23 DIAGNOSIS — E669 Obesity, unspecified: Secondary | ICD-10-CM

## 2015-11-23 DIAGNOSIS — N184 Chronic kidney disease, stage 4 (severe): Secondary | ICD-10-CM

## 2015-11-23 DIAGNOSIS — R7309 Other abnormal glucose: Secondary | ICD-10-CM | POA: Diagnosis not present

## 2015-11-23 DIAGNOSIS — Z0000 Encounter for general adult medical examination without abnormal findings: Secondary | ICD-10-CM

## 2015-11-23 LAB — MAGNESIUM: MAGNESIUM: 2.4 mg/dL (ref 1.5–2.5)

## 2015-11-23 LAB — BASIC METABOLIC PANEL WITH GFR
BUN: 42 mg/dL — AB (ref 7–25)
CALCIUM: 8.5 mg/dL — AB (ref 8.6–10.3)
CO2: 24 mmol/L (ref 20–31)
CREATININE: 2.25 mg/dL — AB (ref 0.70–1.11)
Chloride: 105 mmol/L (ref 98–110)
GFR, Est African American: 30 mL/min — ABNORMAL LOW (ref >=60)
GFR, Est Non African American: 26 mL/min — ABNORMAL LOW (ref >=60)
Glucose, Bld: 99 mg/dL (ref 65–99)
Potassium: 4.5 mmol/L (ref 3.5–5.3)
Sodium: 137 mmol/L (ref 135–146)

## 2015-11-23 LAB — CBC WITH DIFFERENTIAL/PLATELET
Basophils Absolute: 0.1 10*3/uL (ref 0.0–0.1)
Basophils Relative: 1 % (ref 0–1)
EOS PCT: 6 % — AB (ref 0–5)
Eosinophils Absolute: 0.4 10*3/uL (ref 0.0–0.7)
HEMATOCRIT: 30.8 % — AB (ref 39.0–52.0)
HEMOGLOBIN: 10.5 g/dL — AB (ref 13.0–17.0)
LYMPHS PCT: 25 % (ref 12–46)
Lymphs Abs: 1.8 10*3/uL (ref 0.7–4.0)
MCH: 33.2 pg (ref 26.0–34.0)
MCHC: 34.1 g/dL (ref 30.0–36.0)
MCV: 97.5 fL (ref 78.0–100.0)
MONO ABS: 0.7 10*3/uL (ref 0.1–1.0)
MONOS PCT: 10 % (ref 3–12)
MPV: 9.4 fL (ref 8.6–12.4)
NEUTROS ABS: 4.1 10*3/uL (ref 1.7–7.7)
Neutrophils Relative %: 58 % (ref 43–77)
Platelets: 202 10*3/uL (ref 150–400)
RBC: 3.16 MIL/uL — ABNORMAL LOW (ref 4.22–5.81)
RDW: 15.4 % (ref 11.5–15.5)
WBC: 7.1 10*3/uL (ref 4.0–10.5)

## 2015-11-23 LAB — HEPATIC FUNCTION PANEL
ALBUMIN: 3.4 g/dL — AB (ref 3.6–5.1)
ALT: 10 U/L (ref 9–46)
AST: 14 U/L (ref 10–35)
Alkaline Phosphatase: 68 U/L (ref 40–115)
BILIRUBIN DIRECT: 0.1 mg/dL (ref <=0.2)
BILIRUBIN INDIRECT: 0.2 mg/dL (ref 0.2–1.2)
TOTAL PROTEIN: 6 g/dL — AB (ref 6.1–8.1)
Total Bilirubin: 0.3 mg/dL (ref 0.2–1.2)

## 2015-11-23 LAB — LIPID PANEL
CHOLESTEROL: 130 mg/dL (ref 125–200)
HDL: 36 mg/dL — AB (ref >=40)
LDL CALC: 81 mg/dL (ref <130)
TRIGLYCERIDES: 67 mg/dL (ref <150)
Total CHOL/HDL Ratio: 3.6 Ratio (ref <=5.0)
VLDL: 13 mg/dL (ref <30)

## 2015-11-23 LAB — URIC ACID: URIC ACID, SERUM: 5.2 mg/dL (ref 4.0–7.8)

## 2015-11-23 LAB — TSH: TSH: 6.911 u[IU]/mL — AB (ref 0.350–4.500)

## 2015-11-23 LAB — HEMOGLOBIN A1C
Hgb A1c MFr Bld: 6 % — ABNORMAL HIGH (ref <5.7)
Mean Plasma Glucose: 126 mg/dL — ABNORMAL HIGH (ref <117)

## 2015-11-23 NOTE — Progress Notes (Signed)
MEDICARE ANNUAL WELLNESS VISIT AND FOLLOW UP Assessment:   1. Coronary artery disease involving native coronary artery of native heart without angina pectoris Continue follow up Dr. Wynonia Lawman, he has NTG but does not use likely due to inactivity.   2. Essential hypertension - continue medications, DASH diet, exercise and monitor at home. Call if greater than 130/80.  - CBC with Differential - BASIC METABOLIC PANEL WITH GFR - Hepatic function panel  3. Gastroesophageal reflux disease without esophagitis controlled  4. Hypothyroidism, unspecified hypothyroidism type - TSH  5. Prediabetes Discussed general issues about diabetes pathophysiology and management., Educational material distributed., Suggested low cholesterol diet., Encouraged aerobic exercise., Discussed foot care., Reminded to get yearly retinal exam. - Hemoglobin A1c - HM DIABETES FOOT EXAM  6. DJD Long discussion- patient is unable to ambulate/move due to pain- he is on tramadol, we discussed switching to norco or sending to pain management Declines PT  7. Cancer of kidney, left Continue follow up - BASIC METABOLIC PANEL WITH GFR  8. Chronic kidney disease, stage 4 (severe) Continue follow up  - BASIC METABOLIC PANEL WITH GFR  9. Anemia, unspecified anemia type Likely due to CKD - CBC with Differential  10. Encounter for long-term (current) use of other medications - Magnesium  11. Hyperlipidemia -continue medications, check lipids, decrease fatty foods, increase activity. ; - Lipid panel  12. Obesity (BMI 30-39.9) Obesity with co morbidities- long discussion about weight loss, diet, and exercise  13. Spinal stenosis, unspecified spinal region ? Need follow up ortho/pain management  14. Vitamin D deficiency - Vit D  25 hydroxy (rtn osteoporosis monitoring)  15. Gout without tophus, unspecified cause, unspecified chronicity, unspecified site - Uric acid  16. Left 5th digit with discoloration, no ulcer  or other lesions/ ? Blue toe syndrome - no other discoloration, decrease pulses, likely due to PAD versus pressure - keep pressure off of it, may need ABI/CTA, patient states he would like to become more active/walk again. Will get ABI/ versus CTA - continue bASA - Call if gets worse/progresses.    Plan:   During the course of the visit the patient was educated and counseled about appropriate screening and preventive services including:    Pneumococcal vaccine   Influenza vaccine  Td vaccine  Screening electrocardiogram  Colorectal cancer screening  Diabetes screening  Glaucoma screening  Nutrition counseling    Conditions/risks identified: BMI: Discussed weight loss, diet, and increase physical activity.  Increase physical activity: AHA recommends 150 minutes of physical activity a week.  Medications reviewed Diabetes is at goal, ACE/ARB therapy: Yes. Urinary Incontinence is not an issue: discussed non pharmacology and pharmacology options.  Fall risk: high- discussed PT, home fall assessment, medications.    Subjective:  [name] is a 82 y.o. male who presents for Medicare Annual Wellness Visit and 3 month follow up for HTN, hyperlipidemia, prediabetes, and vitamin D Def.  Date of last medicare wellness visit was 2016  His blood pressure has been controlled at home, today their BP is BP: 120/66 mmHg He does not workout. He denies chest pain, shortness of breath, dizziness likely due to inactivity.  He has history of PTCA and follows with Dr. Wynonia Lawman.  He is on cholesterol medication and denies myalgias. His cholesterol is at goal. The cholesterol last visit was:   Lab Results  Component Value Date   CHOL 145 08/20/2015   HDL 35* 08/20/2015   LDLCALC 95 08/20/2015   TRIG 76 08/20/2015   CHOLHDL 4.1  08/20/2015   He has been working on diet and exercise for prediabetes, and denies polydipsia and polyuria. Last A1C in the office was:  Lab Results  Component  Value Date   HGBA1C 6.0* 08/20/2015   Patient is on Vitamin D supplement.   Lab Results  Component Value Date   VD25OH 63 08/20/2015     Patient is on allopurinol for gout and does not report a recent flare.  He is on thyroid medication. His medication was not changed last visit. Patient denies nervousness, palpitations and weight changes.  Lab Results  Component Value Date   TSH 4.002 08/20/2015  .  He is in a wheelchair due to severe pain in his legs and is very inactive, he is on tramadol.  Follows oncology for renal cell carcinoma x 2013 s/p left nephrectomy, has documented pulmonary mets, has completed radiation.  BMI is Body mass index is 38.17 kg/(m^2)., he is working on diet and exercise. Wt Readings from Last 3 Encounters:  11/23/15 251 lb (113.853 kg)  10/17/15 249 lb 14.4 oz (113.354 kg)  08/20/15 245 lb 9.6 oz (111.403 kg)   He complains of left 5th digit being discolored, no injury, he is very inactive, decreased sensation in feet, no ulcers/pain.    Names of Other Physician/Practitioners you currently use: 1. [facility] here for primary care 2. No dentist, has dentures.  Patient Care Team: Unk Pinto, MD as PCP - General (Internal Medicine) Jacolyn Reedy, MD as Consulting Physician (Cardiology) Rob Hickman, MD as Consulting Physician (Ophthalmology)- 1 year due Missy Sabins, MD as Consulting Physician (Gastroenterology) Ninetta Lights, MD as Consulting Physician (Orthopedic Surgery) Dr. Alen Blew- oncology  Medication Review: Current Outpatient Prescriptions on File Prior to Visit  Medication Sig Dispense Refill  . allopurinol (ZYLOPRIM) 300 MG tablet TAKE 1 TABLET DAILY 90 tablet 1  . amLODipine (NORVASC) 5 MG tablet Take 1 tablet (5 mg total) by mouth daily before breakfast. 90 tablet 3  . aspirin EC 81 MG tablet Take 81 mg by mouth daily.    . bumetanide (BUMEX) 2 MG tablet Takes 1/2-1 pill daily for fluid  (Patient taking differently: Take 1 mg by mouth daily. Takes 1/2-1 pill daily for fluid) 90 tablet 3  . Cholecalciferol (VITAMIN D3) 1000 UNITS CAPS Take 2,000 capsules by mouth daily.     . diclofenac sodium (VOLTAREN) 1 % GEL Apply topically 4 (four) times daily.    . famotidine (PEPCID) 40 MG tablet Take 40 mg by mouth daily as needed. stomach    . FERROUS SULFATE PO Take 1 tablet by mouth 2 (two) times daily.     Marland Kitchen levothyroxine (SYNTHROID, LEVOTHROID) 200 MCG tablet Take 200 mcg by mouth daily before breakfast.    . losartan (COZAAR) 50 MG tablet Take 1 tablet (50 mg total) by mouth daily. 90 tablet 4  . Magnesium 250 MG TABS Take 500 mg by mouth daily.     . Multiple Vitamin (MULITIVITAMIN WITH MINERALS) TABS Take 1 tablet by mouth daily.    . nitroGLYCERIN (NITROSTAT) 0.4 MG SL tablet Place 0.4 mg under the tongue every 5 (five) minutes as needed for chest pain.     . pravastatin (PRAVACHOL) 40 MG tablet Take 1 tablet (40 mg total) by mouth at bedtime. 90 tablet 4  . vitamin C (ASCORBIC ACID) 500 MG tablet Take 500 mg by mouth 2 (two) times daily.     No current facility-administered medications on  file prior to visit.    Current Problems (verified) Patient Active Problem List   Diagnosis Date Noted  . 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 Non compliance with medical treatment 08/20/2015  . Medicare annual wellness visit, subsequent 08/18/2015  . Gout 08/18/2015  . BMI 33.58,  adult 08/18/2015  . CAD (coronary artery disease), native coronary artery   . Obesity (BMI 30-39.9)   . Spinal stenosis   . Medication management 01/23/2014  . Hyperlipidemia 11/24/2013  . DJD   . Cancer of kidney ([place])   . GERD (gastroesophageal reflux disease)   . Hypertension   . CKD, stage IV (GFR 28 ml/min) ([facility])   . Hypothyroidism   . Vitamin D deficiency   . Prediabetes     Screening Tests Health Maintenance  Topic Date Due  . INFLUENZA VACCINE   06/10/2016  . TETANUS/TDAP  02/17/2022  . ZOSTAVAX  Completed  . PNA vac Low Risk Adult  Completed    Immunization History  Administered Date(s) Administered  . Influenza Split 09/06/2013  . Influenza, High Dose Seasonal PF 07/20/2014, 08/20/2015  . Pneumococcal Conjugate-13 10/30/2014  . Pneumococcal Polysaccharide-23 02/18/2012  . Td 02/18/2012  . Zoster 03/03/2013    Preventative care: Last colonoscopy: 2010 due 10/2014 but declines Cardiac Cath Date: 06/06/2008;  Stent Placement Date: 06/12/2008;  Holter/Event Monitor Date: 07/27/2008;  Nuclear Study Date: 04/05/2008;  Chest Xray Date: 06/02/2008   Prior vaccinations: TD or Tdap: 2013  Influenza: 2016 Pneumococcal: 2013 Prevnar 13: 2015 Shingles/Zostavax: 2014  Allergies No Known Allergies Surgical history Past Surgical History  Procedure Laterality Date  . Joint replacement      bilateral knee replacements   . Cholecystectomy    . Tonsillectomy    . Appendectomy    . Eye surgery      right eye cataract surgery   . Laparoscopic nephrectomy  04/29/2012    Procedure: LAPAROSCOPIC NEPHRECTOMY;  Surgeon: Dutch Gray, MD;  Location: [facility];  Service: Urology;  Laterality: Left;      . Coronary angioplasty      stents , + 7-41yr. ago  . Back surgery      lower back surgery - 1990's  . Anterior cervical decomp/discectomy fusion N/A 01/18/2013    Procedure: ANTERIOR CERVICAL DECOMPRESSION/DISCECTOMY FUSION 2 LEVELS;  Surgeon: RFaythe Ghee MD;  Location: [facility];  Service: Neurosurgery;  Laterality: N/A;   Family history Family History  Problem Relation Age of Onset  . Cancer Sister     breast  . Cancer Brother     colon    Risk Factors: Tobacco Social History  Substance Use Topics  . Smoking status: Former Smoker    Types: Cigarettes    Quit date: 11/10/1970  . Smokeless tobacco: Never Used  . Alcohol Use: 6.6 oz/week    2 Cans of beer, 9 Shots of liquor per week     Comment: friday- beer &  3 drambuie   He does not smoke.  Patient is a former smoker. Are there smokers in your home (other than you)?  No  Alcohol Current alcohol use: 1-3 beers a night  Caffeine Current caffeine use: coffee 2 /day  Exercise Current exercise: none   Nutrition/Diet Current diet: in general, an "unhealthy" diet  Cardiac risk factors: advanced age (older than 530for men, 626for women), diabetes mellitus, dyslipidemia, family history of premature cardiovascular disease, hypertension, male gender, obesity (BMI >= 30 kg/m2) and sedentary lifestyle.  Depression Screen (Note: if answer to either of the following is "Yes", a more complete depression screening is indicated)   Q1: Over the past two weeks, have you felt down, depressed or hopeless? No  Q2: Over the past two weeks, have you felt little interest or pleasure in doing things? No  Have you lost interest or pleasure in daily life? No  Do you often feel hopeless? No  Do you cry easily over simple problems? No  Activities of Daily Living In your present state of health, do you have any difficulty performing the following activities?:  Driving? Yes Managing money?  No Feeding yourself? o Getting from bed to chair? No Climbing a flight of stairs? Yes Preparing food and eating?: Yes Bathing or showering? Yes, his daughter comes over and helps Getting dressed: No Getting to the toilet? Yes he uses a bottle at home Using the toilet:No Moving around from place to place: Yes In the past year have you fallen or had a near fall?:No  Vision Difficulties: Yes  Hearing Difficulties: Yes- has hearing aids Do you often ask people to speak up or repeat themselves? Yes Do you experience ringing or noises in your ears? No Do you have difficulty understanding soft or whispered voices? Yes  Cognition  Do you feel that you have a problem with memory?Yes  Do you often misplace items? No  Do you feel safe at home?  Yes Lives by  himself  Advanced directives Does patient have a Everetts? Yes Does patient have a Living Will? Yes   Objective:   Blood pressure 120/66, pulse 76, temperature 98.1 F (36.7 C), temperature source Temporal, resp. rate 16, height '5\' 8"'$  (1.727 m), weight 251 lb (113.853 kg), SpO2 96 %. Body mass index is 38.17 kg/(m^2).  General appearance: alert, no distress, WD/WN, male Cognitive Testing  Alert? Yes  Normal Appearance?Yes  Oriented to person? Yes  Place? Yes   Time? Yes  Recall of three objects?  Yes  Can perform simple calculations? Yes  Displays appropriate judgment?Yes  Can read the correct time from a watch face?Yes  HEENT: normocephalic, sclerae anicteric, TMs pearly, nares patent, no discharge or erythema, pharynx normal Oral cavity: MMM, no lesions Neck: supple, no lymphadenopathy, no thyromegaly, no masses Heart: RRR, distant heart sounds, normal S1, S2, 2/6 systolic murmur LSB Lungs: CTA bilaterally, no wheezes, rhonchi, or rales Abdomen: +bs, soft, obese non tender, non distended, no masses, no hepatomegaly, no splenomegaly Musculoskeletal: patient has swelling bilateral knees, with tenderness.  Extremities: 1-2+  Edema, has 0-1 + DP, and 1+ TP,  Left 5th digit with blue color, still with cap refill, no ulcers. Decreased sensation bilateral feet to shins.  Pulses: 2+ symmetric, upper and absent DP and decrease TP Neurological: alert, oriented x 3, CN2-12 intact, strength normal upper extremities and decreased 3/5 lower extremities,DTRs 2+ throughout, no cerebellar signs, patient in wheel chair Psychiatric: normal affect, behavior normal, pleasant   Medicare Attestation I have personally reviewed: The patient's medical and social history Their use of alcohol, tobacco or illicit drugs Their current medications and supplements The patient's functional ability including ADLs,fall risks, home safety risks, cognitive, and hearing and visual  impairment Diet and physical activities Evidence for depression or mood disorders  The patient's weight, height, BMI, and visual acuity have been recorded in the chart.  I have made referrals, counseling, and provided education to the patient based on review of the above and I have provided the patient  with a written personalized care plan for preventive services.     Vicie Mutters, PA-C   11/23/2015

## 2015-11-23 NOTE — Patient Instructions (Signed)
We want weight loss that will last so you should lose 1-2 pounds a week.  THAT IS IT! Please pick THREE things a month to change. Once it is a habit check off the item. Then pick another three items off the list to become habits.  If you are already doing a habit on the list GREAT!  Cross that item off! o Don't drink your calories. Ie, alcohol, soda, fruit juice, and sweet tea.  o Drink more water. Drink a glass when you feel hungry or before each meal.  o Eat breakfast - Complex carb and protein (likeDannon light and fit yogurt, oatmeal, fruit, eggs, [place] bacon). o Measure your cereal.  Eat no more than one cup a day. (ie [place]) o Eat an apple a day. o Add a vegetable a day. o Try a new vegetable a month. o Use Pam! Stop using oil or butter to cook. o Don't finish your plate or use smaller plates. o Share your dessert. o Eat sugar free Jello for dessert or frozen grapes. o Don't eat 2-3 hours before bed. o Switch to whole wheat bread, pasta, and brown rice. o Make healthier choices when you eat out. No fries! o Pick baked chicken, NOT fried. o Don't forget to SLOW DOWN when you eat. It is not going anywhere.  o Take the stairs. o Park far away in the parking lot o [employer] (or weights) for 10 minutes while watching TV. o Walk at work for 10 minutes during break. o Walk outside 1 time a week with your friend, kids, dog, or significant other. o Start a walking group at [place] the mall as much as you can tolerate.  o Keep a food diary. o Weigh yourself daily. o Walk for 15 minutes 3 days per week. o Cook at home more often and eat out less.  If life happens and you go back to old habits, it is okay.  Just start over. You can do it!   If you experience chest pain, get short of breath, or tired during the exercise, please stop immediately and inform your doctor.   .Intermittent Claudication Intermittent claudication is pain in your leg that occurs when you walk or  exercise and goes away when you rest. The pain can occur in one or both legs. CAUSES Intermittent claudication is caused by the buildup of plaque within the major arteries in the body (atherosclerosis). The plaque, which makes arteries stiff and narrow, prevents enough blood from reaching your leg muscles. The pain occurs when you walk or exercise because your muscles need more blood when you are moving and exercising. RISK FACTORS Risk factors include:  A family history of atherosclerosis.  A personal history of stroke or heart disease.  Older age.  Being inactive or overweight.  Smoking cigarettes.  Having another health condition such as:  Diabetes.  High blood pressure.  High cholesterol. SIGNS AND SYMPTOMS  Your hip or leg may:   Ache.  Cramp.  Feel tight.  Feel weak.  Feel heavy. Over time, you may feel pain in your calf, thigh, or hip. DIAGNOSIS  Your health care provider may diagnose intermittent claudication based on your symptoms and medical history. Your health care provider may also do tests to learn more about your condition. These may include:  Blood tests.  An ultrasound.  Imaging tests such as angiography, magnetic resonance angiography (MRA), and computed tomography angiography (CTA). TREATMENT You may be treated for problems such as:  High blood pressure.  High cholesterol.  Diabetes. Other treatments may include:  Lifestyle changes such as:  Starting an exercise program.  Losing weight.  Quitting smoking.  Medicines to help restore blood flow through your legs.  Blood vessel surgery (angioplasty) to restore blood flow if your intermittent claudication is caused by severe peripheral artery disease. HOME CARE INSTRUCTIONS  Manage any other health conditions you have.  Eat a diet low in saturated fats and calories to maintain a healthy weight.  Quit smoking, if you smoke.  Take medicines only as directed by your health care  provider.  If your health care provider recommended an exercise program for you, follow it as directed. Your exercise program may involve:  Walking three or more times a week.  Walking until you have certain symptoms of intermittent claudication.  Resting until symptoms go away.  Gradually increasing walking time to about 50 minutes a day. SEEK MEDICAL CARE IF: Your condition is not getting better or is getting worse. SEEK IMMEDIATE MEDICAL CARE IF:   You have chest pain.  You have difficulty breathing.  You develop arm weakness.  You have trouble speaking.  Your face begins to droop. MAKE SURE YOU:  Understand these instructions.  Will watch your condition.  Will get help if you are not doing well or get worse.   This information is not intended to replace advice given to you by your health care provider. Make sure you discuss any questions you have with your health care provider.   Document Released: 08/29/2004 Document Revised: 11/17/2014 Document Reviewed: 02/02/2014 Elsevier Interactive Patient Education [employer].

## 2015-11-24 LAB — VITAMIN D 25 HYDROXY (VIT D DEFICIENCY, FRACTURES): VIT D 25 HYDROXY: 67 ng/mL (ref 30–100)

## 2015-12-31 ENCOUNTER — Ambulatory Visit (INDEPENDENT_AMBULATORY_CARE_PROVIDER_SITE_OTHER): Payer: Medicare Other

## 2015-12-31 DIAGNOSIS — E039 Hypothyroidism, unspecified: Secondary | ICD-10-CM

## 2015-12-31 LAB — TSH: TSH: 7.29 mIU/L — ABNORMAL HIGH (ref 0.40–4.50)

## 2016-01-01 ENCOUNTER — Other Ambulatory Visit: Payer: Self-pay | Admitting: Physician Assistant

## 2016-01-07 ENCOUNTER — Encounter: Payer: Self-pay | Admitting: Physician Assistant

## 2016-01-07 ENCOUNTER — Other Ambulatory Visit: Payer: Self-pay | Admitting: Physician Assistant

## 2016-01-07 ENCOUNTER — Other Ambulatory Visit: Payer: Self-pay | Admitting: Internal Medicine

## 2016-01-07 MED ORDER — LEVOTHYROXINE SODIUM 150 MCG PO TABS: ORAL_TABLET | ORAL | Status: DC

## 2016-01-15 ENCOUNTER — Ambulatory Visit (HOSPITAL_COMMUNITY): Payer: Medicare Other

## 2016-01-15 ENCOUNTER — Other Ambulatory Visit (HOSPITAL_BASED_OUTPATIENT_CLINIC_OR_DEPARTMENT_OTHER): Payer: Medicare Other

## 2016-01-15 DIAGNOSIS — C649 Malignant neoplasm of unspecified kidney, except renal pelvis: Secondary | ICD-10-CM | POA: Diagnosis not present

## 2016-01-15 LAB — CBC WITH DIFFERENTIAL/PLATELET
BASO%: 0.5 % (ref 0.0–2.0)
Basophils Absolute: 0 10*3/uL (ref 0.0–0.1)
EOS%: 5.9 % (ref 0.0–7.0)
Eosinophils Absolute: 0.4 10*3/uL (ref 0.0–0.5)
HCT: 32.5 % — ABNORMAL LOW (ref 38.4–49.9)
HGB: 10.4 g/dL — ABNORMAL LOW (ref 13.0–17.1)
LYMPH%: 25.7 % (ref 14.0–49.0)
MCH: 31.6 pg (ref 27.2–33.4)
MCHC: 32 g/dL (ref 32.0–36.0)
MCV: 98.7 fL — ABNORMAL HIGH (ref 79.3–98.0)
MONO#: 0.8 10*3/uL (ref 0.1–0.9)
MONO%: 10.7 % (ref 0.0–14.0)
NEUT%: 57.2 % (ref 39.0–75.0)
NEUTROS ABS: 4 10*3/uL (ref 1.5–6.5)
PLATELETS: 191 10*3/uL (ref 140–400)
RBC: 3.3 10*6/uL — AB (ref 4.20–5.82)
RDW: 14.8 % — ABNORMAL HIGH (ref 11.0–14.6)
WBC: 7.1 10*3/uL (ref 4.0–10.3)
lymph#: 1.8 10*3/uL (ref 0.9–3.3)

## 2016-01-15 LAB — COMPREHENSIVE METABOLIC PANEL
ALT: 12 U/L (ref 0–55)
ANION GAP: 10 meq/L (ref 3–11)
AST: 16 U/L (ref 5–34)
Albumin: 3.3 g/dL — ABNORMAL LOW (ref 3.5–5.0)
Alkaline Phosphatase: 83 U/L (ref 40–150)
BILIRUBIN TOTAL: 0.31 mg/dL (ref 0.20–1.20)
BUN: 49.8 mg/dL — AB (ref 7.0–26.0)
CALCIUM: 8.8 mg/dL (ref 8.4–10.4)
CO2: 20 meq/L — AB (ref 22–29)
CREATININE: 2.5 mg/dL — AB (ref 0.7–1.3)
Chloride: 108 mEq/L (ref 98–109)
EGFR: 23 mL/min/{1.73_m2} — ABNORMAL LOW (ref >90)
Glucose: 112 mg/dl (ref 70–140)
Potassium: 4.7 mEq/L (ref 3.5–5.1)
Sodium: 137 mEq/L (ref 136–145)
TOTAL PROTEIN: 7 g/dL (ref 6.4–8.3)

## 2016-01-16 ENCOUNTER — Ambulatory Visit (HOSPITAL_COMMUNITY)
Admission: RE | Admit: 2016-01-16 | Discharge: 2016-01-16 | Disposition: A | Discharge status: Home / Self Care | Payer: Medicare Other | Source: Ambulatory Visit | Attending: Oncology | Admitting: Oncology

## 2016-01-16 ENCOUNTER — Ambulatory Visit (HOSPITAL_COMMUNITY): Payer: Medicare Other

## 2016-01-16 DIAGNOSIS — Z923 Personal history of irradiation: Secondary | ICD-10-CM | POA: Insufficient documentation

## 2016-01-16 DIAGNOSIS — Z905 Acquired absence of kidney: Secondary | ICD-10-CM | POA: Diagnosis not present

## 2016-01-16 DIAGNOSIS — I700 Atherosclerosis of aorta: Secondary | ICD-10-CM | POA: Diagnosis not present

## 2016-01-16 DIAGNOSIS — C649 Malignant neoplasm of unspecified kidney, except renal pelvis: Secondary | ICD-10-CM | POA: Diagnosis not present

## 2016-01-16 DIAGNOSIS — R911 Solitary pulmonary nodule: Secondary | ICD-10-CM | POA: Insufficient documentation

## 2016-01-17 ENCOUNTER — Telehealth: Payer: Self-pay | Admitting: Oncology

## 2016-01-17 ENCOUNTER — Ambulatory Visit (HOSPITAL_BASED_OUTPATIENT_CLINIC_OR_DEPARTMENT_OTHER): Payer: Medicare Other | Admitting: Oncology

## 2016-01-17 VITALS — BP 145/50 | HR 66 | Temp 98.1°F | Resp 17 | Wt 255.5 lb

## 2016-01-17 DIAGNOSIS — C7801 Secondary malignant neoplasm of right lung: Secondary | ICD-10-CM | POA: Diagnosis not present

## 2016-01-17 DIAGNOSIS — C642 Malignant neoplasm of left kidney, except renal pelvis: Secondary | ICD-10-CM

## 2016-01-17 DIAGNOSIS — C649 Malignant neoplasm of unspecified kidney, except renal pelvis: Secondary | ICD-10-CM

## 2016-01-17 NOTE — Telephone Encounter (Signed)
Gave and printed appt sched and avs for pt for Sept °

## 2016-01-17 NOTE — Progress Notes (Signed)
Hematology and Oncology Follow Up Visit  [name] [id number] June 18, 1929 82 y.o. 01/17/2016 3:25 PM Malone,Chad DAVID, MDMcKeown, Chad Saxon, MD   Principle Diagnosis: 82 year old gentleman diagnosed with renal cell carcinoma in June 2013. He had a T2a clear cell histology after  presented with painless 9.6 x 9.2 x 7.8 cm left renal neoplasm with central necrosis suspicious for malignancy. He has documented pulmonary metastasis.   Prior Therapy:   He underwent a left laparoscopic radical nephrectomy on 04/29/2012 by Dr. Alinda Money. 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 The margins were negative of tumor without any evidence of local lymphadenopathy. The final pathological staging was T2 1 N0.  He is status post biopsy of right lower lung nodule obtained on 07/30/2015 which confirmed the presence of renal cell carcinoma.  He is status post radiation therapy to an isolated lung metastasis in the right lower lobe. He received total 54 gray in 3 fractions on 10/08/2015, 09/11/2015 and 09/14/2015.  Current therapy: He is on observation and surveillance.  Interim History: [name] presents today for a follow-up visit. Since the last visit, he reports no major changes in his health. He does not report any delayed complications related to radiation therapy. He denied any dyspnea on exertion, cough or hemoptysis. His performance status is quite limited but unchanged since the last visit. He reports no recent hospitalization or illnesses. He still elevated and his mobility related to his knees. He does use a walker for mobility without any falls or syncope.  He does not report any headaches, blurry vision, syncope or seizures. He does not report any fevers, chills, sweats weight loss or appetite changes. He does not report any chest pain. He does not report any nausea, vomiting, abdominal pain, early satiety, change in his bowel habits  or rectal bleeding. He does not report any frequency, urgency, hesitancy or skeletal complaints. He does not report any lymphadenopathy or petechiae. He does report hearing deficits which is unchanged. and the remaining review of system is unremarkable  Medications: I have reviewed the patient's current medications.  Current Outpatient Prescriptions  Medication Sig Dispense Refill  . allopurinol (ZYLOPRIM) 300 MG tablet TAKE 1 TABLET DAILY 90 tablet 1  . amLODipine (NORVASC) 5 MG tablet Take 1 tablet (5 mg total) by mouth daily before breakfast. 90 tablet 3  . aspirin EC 81 MG tablet Take 81 mg by mouth daily.    . bumetanide (BUMEX) 2 MG tablet TAKE ONE-HALF (1/2) TO ONE TABLET DAILY FOR FLUID 90 tablet 2  . Cholecalciferol (VITAMIN D3) 1000 UNITS CAPS Take 2,000 capsules by mouth daily.     . diclofenac sodium (VOLTAREN) 1 % GEL Apply topically 4 (four) times daily.    . famotidine (PEPCID) 40 MG tablet Take 40 mg by mouth daily as needed. stomach    . FERROUS SULFATE PO Take 1 tablet by mouth 2 (two) times daily.     Marland Kitchen levothyroxine (SYNTHROID, LEVOTHROID) 150 MCG tablet Take 1 pill daily but take 1.5 pills on Sunday, take 1 hour before food, only with water. 100 tablet 1  . losartan (COZAAR) 50 MG tablet Take 1 tablet (50 mg total) by mouth daily. 90 tablet 4  . Magnesium 250 MG TABS Take 500 mg by mouth daily.     . Multiple Vitamin (MULITIVITAMIN WITH MINERALS) TABS Take 1 tablet by mouth daily.    . nitroGLYCERIN (NITROSTAT) 0.4 MG SL tablet  Place 0.4 mg under the tongue every 5 (five) minutes as needed for chest pain.     . pravastatin (PRAVACHOL) 40 MG tablet Take 1 tablet (40 mg total) by mouth at bedtime. 90 tablet 4  . traMADol (ULTRAM) 50 MG tablet     . vitamin C (ASCORBIC ACID) 500 MG tablet Take 500 mg by mouth 2 (two) times daily.     No current facility-administered medications for this visit.     Allergies: No Known Allergies  Past Medical History, Surgical history,  Social history, and Family History were reviewed and updated.   Physical Exam: Blood pressure 145/50, pulse 66, temperature 98.1 F (36.7 C), temperature source Oral, resp. rate 17, weight 255 lb 8 oz (115.894 kg), SpO2 98 %. ECOG: 2 General appearance: alert and cooperative without distress. Head: Normocephalic, without obvious abnormality no oral ulcers or lesions. Neck: no adenopathy Lymph nodes: Cervical, supraclavicular, and axillary nodes normal. Heart:regular rate and rhythm, S1, S2 normal, no murmur, click, rub or gallop Lung:chest clear, no wheezing, rales, normal symmetric air entry. Scattered rhonchi noted. Abdomin: soft, non-tender, without masses or organomegaly no rebound or guarding. EXT:no erythema, induration, or nodules   Lab Results: Lab Results  Component Value Date   WBC 7.1 01/15/2016   HGB 10.4* 01/15/2016   HCT 32.5* 01/15/2016   MCV 98.7* 01/15/2016   PLT 191 01/15/2016     Chemistry      Component Value Date/Time   NA 137 01/15/2016 0841   NA 137 11/23/2015 0922   K 4.7 01/15/2016 0841   K 4.5 11/23/2015 0922   CL 105 11/23/2015 0922   CO2 20* 01/15/2016 0841   CO2 24 11/23/2015 0922   BUN 49.8* 01/15/2016 0841   BUN 42* 11/23/2015 0922   CREATININE 2.5* 01/15/2016 0841   CREATININE 2.25* 11/23/2015 0922   CREATININE 2.11* 12/20/2013 1600      Component Value Date/Time   CALCIUM 8.8 01/15/2016 0841   CALCIUM 8.5* 11/23/2015 0922   ALKPHOS 83 01/15/2016 0841   ALKPHOS 68 11/23/2015 0922   AST 16 01/15/2016 0841   AST 14 11/23/2015 0922   ALT 12 01/15/2016 0841   ALT 10 11/23/2015 0922   BILITOT 0.31 01/15/2016 0841   BILITOT 0.3 11/23/2015 0922     EXAM: CT CHEST, ABDOMEN AND PELVIS WITHOUT CONTRAST  TECHNIQUE: Multidetector CT imaging of the chest, abdomen and pelvis was performed following the standard protocol without IV contrast.  COMPARISON: CT 06/15/2015  FINDINGS: CT CHEST FINDINGS  Mediastinum/Nodes: No axillary  or supraclavicular lymphadenopathy. No mediastinal or hilar lymphadenopathy. Coronary calcifications noted. Normal esophagus.  Lungs/Pleura: RIGHT lower lobe nodule is difficult to define on the background of the segmental consolidated pattern in the RIGHT lower lobe. 18 mm nodule on image 37, series 4 likely corresponds to 17 mm nodule on comparison exam from 06/15/2015. Again there is peripheral consolidation in around this RIGHT lower lobe nodule. There is an azygos fissure in the RIGHT upper lobe. No new nodularity on the LEFT or RIGHT.  Musculoskeletal: No aggressive osseous lesion.  CT ABDOMEN AND PELVIS FINDINGS  Hepatobiliary: No focal hepatic lesions noncontrast exam. Post cholecystectomy.  Pancreas: Pancreas is normal. No ductal dilatation. No pancreatic inflammation.  Spleen: Normal spleen  Adrenals/urinary tract: Normal adrenal glands. Patient status post LEFT nephrectomy. No nodularity within the nephrectomy bed. RIGHT kidney is normal on this noncontrast exam. RIGHT ureter and bladder normal. Small bladder diverticulum in the LEFT aspect of the dome of the  bladder.  Stomach/Bowel: Stomach, small bowel, appendix, and cecum are normal. The colon and rectosigmoid colon are normal.  Vascular/Lymphatic: Abdominal aorta is normal caliber with atherosclerotic calcification. There is no retroperitoneal or periportal lymphadenopathy. No pelvic lymphadenopathy.  Reproductive: Prostate normal.  Other: No peritoneal metastasis. No free fluid.  Musculoskeletal: No aggressive osseous lesion. Fluid collection adjacent to the LEFT greater trochanter within the subcutaneous tissue measures 7.3 cm and has simple fluid attenuation.  IMPRESSION: Chest Impression:  1. New consolidative pattern surrounding the RIGHT lower lobe nodule following radiation treatment. Nodule is similar in size. 2. No new pulmonary nodules within the lungs. 3. No mediastinal  adenopathy.  Abdomen / Pelvis Impression:  1. LEFT nephrectomy without evidence of local recurrence. 2. No evidence of metastatic disease in the abdomen pelvis. 3. Benign fluid collection in the subcutaneous tissues of the LEFT hip. 4. Atherosclerotic calcification of the abdominal aorta.    Impression and Plan:  82 year old gentleman with the following issues:  1. Renal cell carcinoma diagnosed in June 2013. He presented with a 9 cm mass and underwent a radical nephrectomy on June 2013. The final pathology showed clear cell renal cell carcinoma with the final pathological staging of T2a without any lymph node involvement. He remained disease-free with imaging studies up to August 2016. Now has stage IV disease.  He is status post radiation therapy to an isolated pulmonary metastasis completed in November 2016.   CT scan obtained on 01/16/2016 was reviewed today and showed no evidence of disease progression. His pulmonary nodule had been treated without any evidence of recurrence. The plan is to continue with active surveillance and defer systemic treatment given the lack of high-volume disease at this time. The risks associated with anticancer therapy outweighs any potential benefit. He develops widely metastatic disease, we will consider systemic therapy at that time.   2. 17 mm right lower lung nodule discovered on a CT scan on August 2016. This is biopsy proven to be metastatic renal cell carcinoma. Status post ablative therapy by radiation completed in November 2016.  3. Follow-up: Will be in 6 months and repeat imaging studies.     Zola Button, MD 3/9/20173:25 PM

## 2016-01-23 ENCOUNTER — Other Ambulatory Visit: Payer: Self-pay | Admitting: Internal Medicine

## 2016-01-23 DIAGNOSIS — G894 Chronic pain syndrome: Secondary | ICD-10-CM

## 2016-01-25 ENCOUNTER — Ambulatory Visit (INDEPENDENT_AMBULATORY_CARE_PROVIDER_SITE_OTHER): Payer: Medicare Other

## 2016-01-25 DIAGNOSIS — C649 Malignant neoplasm of unspecified kidney, except renal pelvis: Secondary | ICD-10-CM | POA: Diagnosis not present

## 2016-01-25 DIAGNOSIS — E039 Hypothyroidism, unspecified: Secondary | ICD-10-CM

## 2016-01-25 LAB — TSH: TSH: 2.18 m[IU]/L (ref 0.40–4.50)

## 2016-02-25 ENCOUNTER — Ambulatory Visit: Payer: Self-pay | Admitting: Internal Medicine

## 2016-02-27 ENCOUNTER — Encounter: Payer: Self-pay | Admitting: Internal Medicine

## 2016-02-27 NOTE — Patient Instructions (Signed)

## 2016-02-27 NOTE — Progress Notes (Signed)
Patient ID: [name], male   DOB: 1929-07-05, 82 y.o.   MRN: [id number]   This very nice 82 y.o. Welch presents for 6  month follow up with Hypertension, ASCAD, Hyperlipidemia, Gout, Pre-Diabetes, Hypothyroidism and Vitamin D Deficiency. Patient is also followed for Gout & CKD 4 (GFR 28 ml/min)    Patient is treated for HTN circa 1997 & in 2009 had PCA and stent placed and is followed by Dr Wynonia Lawman.   BP has been controlled at home. Today's BP is 144/68. Patient has had no complaints of any cardiac type chest pain, palpitations, dyspnea/orthopnea/PND, dizziness, claudication, or dependent edema.   Hyperlipidemia is controlled with diet & meds. Patient denies myalgias or other med SE's. Last Lipids were at goal with Cholesterol 130; HDL 36*; LDL 81; Triglycerides 67 on 11/23/2015.   Also, the patient has history of Morbid Obesity (BMI 33+) and consequent PreDiabetes since 2009 with A1c 6.0% and he has had no symptoms of reactive hypoglycemia, diabetic polys, paresthesias or visual blurring.  Last A1c was still 6.0% on 11/23/2015.   Further, the patient also has history of Vitamin D Deficiency of "24" in 2008  and supplements vitamin D without any suspected side-effects. Last vitamin D was 67 on 11/23/2015.  Medication Sig  . allopurinol  300 MG tablet TAKE 1 TABLET DAILY  . amLODipine  5 MG tablet Take 1 tablet (5 mg total) by mouth daily before breakfast.  . aspirin EC 81 MG tablet Take 81 mg by mouth daily.  . bumetanide  2 MG tablet TAKE ONE-HALF (1/2) TO ONE TABLET DAILY FOR FLUID  . VITAMIN D1000 UNITS  Take 2,000 capsules by mouth daily.   . VOLTAREN 1 % GEL Apply topically 4 (four) times daily.  . famotidine (PEPCID) 40 MG  Take 40 mg by mouth daily as needed. stomach  . FERROUS SULFATE Take 1 tablet by mouth 2 (two) times daily.   Marland Kitchen levothyroxine  150 MCG tablet Take 1 pill daily but take 1.5 pills on Sunday, take 1 hour before food, only with water.  Marland Kitchen losartan (COZAAR) 50 MG tablet Take  1 tablet (50 mg total) by mouth daily.  . Magnesium 250 MG TABS Take 500 mg by mouth daily.   [name] WITH MINERALS Take 1 tablet by mouth daily.  Marland Kitchen NITROSTAT 0.4 MG SL tablet Place 0.4 mg under the tongue every 5 (five) minutes as needed for chest pain.   . pravastatin  40 MG  Take 1 tablet (40 mg total) by mouth at bedtime.  . traMADol (ULTRAM) 50 MG tablet TAKE 1 TABLET BY MOUTH UP TO 3 TIMES DAILY AS NEEDED FOR SEVERE PAIN  . vitamin C  500 MG tablet Take 500 mg by mouth 2 (two) times daily.   No Known Allergies  PMHx:   Past Medical History  Diagnosis Date  . Hard of hearing   . Coronary artery disease   . Hearing deficit     hearing aids- bilateral  . Anemia   . Arthritis     stenosis - back, neck  . [facility])     renal neoplasm  . GERD (gastroesophageal reflux disease)   . Hypertension     sees Dr. Wynonia Lawman, stress test recently for prep for surgery  . Chronic kidney disease     renalCa- [facility]) 04/2012  . Hypothyroidism   . Vitamin D deficiency   . Prediabetes   . Lung cancer ([place])     right  lower lobe nodule suspicious for metastatic clear cell renal cell neoplasm   Immunization History  Administered Date(s) Administered  . Influenza Split 09/06/2013  . Influenza, High Dose Seasonal PF 07/20/2014, 08/20/2015  . Pneumococcal Conjugate-13 10/30/2014  . Pneumococcal Polysaccharide-23 02/18/2012  . Td 02/18/2012  . Zoster 03/03/2013   Past Surgical History  Procedure Laterality Date  . Joint replacement      bilateral knee replacements   . Cholecystectomy    . Tonsillectomy    . Appendectomy    . Eye surgery      right eye cataract surgery   . Laparoscopic nephrectomy  04/29/2012    Procedure: LAPAROSCOPIC NEPHRECTOMY;  Surgeon: Dutch Gray, MD;  Location: [facility] ORS;  Service: Urology;  Laterality: Left;      . Coronary angioplasty      stents , + 7-60yr. ago  . Back surgery      lower back surgery - 1990's  . Anterior cervical  decomp/discectomy fusion N/A 01/18/2013    Procedure: ANTERIOR CERVICAL DECOMPRESSION/DISCECTOMY FUSION 2 LEVELS;  Surgeon: RFaythe Ghee MD;  Location: [facility] NEURO ORS;  Service: Neurosurgery;  Laterality: N/A;   FHx:    Reviewed / unchanged  SHx:    Reviewed / unchanged  Systems Review:  Constitutional: Denies fever, chills, wt changes, headaches, insomnia, fatigue, night sweats, change in appetite. Eyes: Denies redness, blurred vision, diplopia, discharge, itchy, watery eyes.  ENT: Denies discharge, congestion, post nasal drip, epistaxis, sore throat, earache, hearing loss, dental pain, tinnitus, vertigo, sinus pain, snoring.  CV: Denies chest pain, palpitations, irregular heartbeat, syncope, dyspnea, diaphoresis, orthopnea, PND, claudication or edema. Respiratory: denies cough, dyspnea, DOE, pleurisy, hoarseness, laryngitis, wheezing.  Gastrointestinal: Denies dysphagia, odynophagia, heartburn, reflux, water brash, abdominal pain or cramps, nausea, vomiting, bloating, diarrhea, constipation, hematemesis, melena, hematochezia  or hemorrhoids. Genitourinary: Denies dysuria, frequency, urgency, nocturia, hesitancy, discharge, hematuria or flank pain. Musculoskeletal: Denies arthralgias, myalgias, stiffness, jt. swelling, pain, limping or strain/sprain.  Skin: Denies pruritus, rash, hives, warts, acne, eczema or change in skin lesion(s). Neuro: No weakness, tremor, incoordination, spasms, paresthesia or pain. Psychiatric: Denies confusion, memory loss or sensory loss. Endo: Denies change in weight, skin or hair change.  Heme/Lymph: No excessive bleeding, bruising or enlarged lymph nodes.  Physical Exam  BP 144/68 mmHg  Pulse 76  Temp(Src) 97.8 F (36.6 C)  Resp 18  Ht '5\' 8"'$  (1.727 m)  Wt 250 lb (113.399 kg)  BMI 38.02 kg/m2  Appears over nourished and in no distress.  Eyes: PERRLA, EOMs, conjunctiva no swelling or erythema. Sinuses: No frontal/maxillary tenderness ENT/Mouth: EAC's  clear, TM's nl w/o erythema, bulging. Nares clear w/o erythema, swelling, exudates. Oropharynx clear without erythema or exudates. Oral hygiene is good. Tongue normal, non obstructing. Hearing intact.  Neck: Supple. Thyroid nl. Car 2+/2+ without bruits, nodes or JVD. Chest: Respirations nl with BS clear & equal w/o rales, rhonchi, wheezing or stridor.  Cor: Heart sounds normal w/ regular rate and rhythm without sig. murmurs, gallops, clicks, or rubs. Peripheral pulses normal and equal  without edema.  Abdomen: Soft, obese  & bowel sounds normal. Non-tender w/o guarding, rebound, hernias, masses, or organomegaly.  Lymphatics: Unremarkable.  Musculoskeletal: Full ROM all peripheral extremities, joint stability, 5/5 strength, and in W/C. Skin: Warm, dry without exposed rashes, lesions or ecchymosis apparent.  Neuro: Cranial nerves intact, reflexes equal bilaterally. Sensory-motor testing grossly intact. Tendon reflexes grossly intact.  Pysch: Alert & oriented x 3.  Insight and judgement nl & appropriate. No ideations.  Assessment and Plan:  1. Essential hypertensio - TSH  2. Hyperlipidemia  - Lipid panel - TSH  3. Abnormal blood sugar  - Hemoglobin A1c - Insulin, random  4. Vitamin D deficiency  - VITAMIN D 25 Hydroxy   5. CKD, stage IV (GFR 28 ml/min) (HCC)   6. DJD   7. Hypothyroidism, unspecified hypothyroidism type  - TSH  8. Coronary artery disease involving native coronary artery of native heart without angina pectoris   9. Medication management  - CBC with Differential/Platelet - BASIC METABOLIC PANEL WITH GFR - Hepatic function panel - Magnesium - uric acid   Recommended regular exercise, BP monitoring, weight control, and discussed med and SE's. Recommended labs to assess and monitor clinical status. Further disposition pending results of labs. Over 30 minutes of exam, counseling, chart review was performed

## 2016-02-28 ENCOUNTER — Ambulatory Visit (INDEPENDENT_AMBULATORY_CARE_PROVIDER_SITE_OTHER): Payer: Medicare Other | Admitting: Internal Medicine

## 2016-02-28 ENCOUNTER — Encounter: Payer: Self-pay | Admitting: Internal Medicine

## 2016-02-28 VITALS — BP 144/68 | HR 76 | Temp 97.8°F | Resp 18 | Ht 68.0 in | Wt 250.0 lb

## 2016-02-28 DIAGNOSIS — Z79899 Other long term (current) drug therapy: Secondary | ICD-10-CM | POA: Diagnosis not present

## 2016-02-28 DIAGNOSIS — E782 Mixed hyperlipidemia: Secondary | ICD-10-CM | POA: Diagnosis not present

## 2016-02-28 DIAGNOSIS — M159 Polyosteoarthritis, unspecified: Secondary | ICD-10-CM

## 2016-02-28 DIAGNOSIS — N184 Chronic kidney disease, stage 4 (severe): Secondary | ICD-10-CM | POA: Diagnosis not present

## 2016-02-28 DIAGNOSIS — M1000 Idiopathic gout, unspecified site: Secondary | ICD-10-CM

## 2016-02-28 DIAGNOSIS — I10 Essential (primary) hypertension: Secondary | ICD-10-CM | POA: Diagnosis not present

## 2016-02-28 DIAGNOSIS — I2510 Atherosclerotic heart disease of native coronary artery without angina pectoris: Secondary | ICD-10-CM | POA: Diagnosis not present

## 2016-02-28 DIAGNOSIS — E039 Hypothyroidism, unspecified: Secondary | ICD-10-CM | POA: Diagnosis not present

## 2016-02-28 DIAGNOSIS — E559 Vitamin D deficiency, unspecified: Secondary | ICD-10-CM | POA: Diagnosis not present

## 2016-02-28 DIAGNOSIS — R7309 Other abnormal glucose: Secondary | ICD-10-CM

## 2016-02-28 LAB — BASIC METABOLIC PANEL WITH GFR
BUN: 39 mg/dL — ABNORMAL HIGH (ref 7–25)
CHLORIDE: 105 mmol/L (ref 98–110)
CO2: 21 mmol/L (ref 20–31)
CREATININE: 2 mg/dL — AB (ref 0.70–1.11)
Calcium: 8.8 mg/dL (ref 8.6–10.3)
GFR, EST AFRICAN AMERICAN: 34 mL/min — AB (ref >=60)
GFR, Est Non African American: 30 mL/min — ABNORMAL LOW (ref >=60)
Glucose, Bld: 106 mg/dL — ABNORMAL HIGH (ref 65–99)
POTASSIUM: 4.5 mmol/L (ref 3.5–5.3)
SODIUM: 138 mmol/L (ref 135–146)

## 2016-02-28 LAB — CBC WITH DIFFERENTIAL/PLATELET
BASOS PCT: 1 %
Basophils Absolute: 73 cells/uL (ref 0–200)
EOS PCT: 6 %
Eosinophils Absolute: 438 cells/uL (ref 15–500)
HCT: 34.3 % — ABNORMAL LOW (ref 38.5–50.0)
Hemoglobin: 10.9 g/dL — ABNORMAL LOW (ref 13.2–17.1)
LYMPHS PCT: 23 %
Lymphs Abs: 1679 cells/uL (ref 850–3900)
MCH: 31 pg (ref 27.0–33.0)
MCHC: 31.8 g/dL — ABNORMAL LOW (ref 32.0–36.0)
MCV: 97.4 fL (ref 80.0–100.0)
MONOS PCT: 11 %
MPV: 10 fL (ref 7.5–12.5)
Monocytes Absolute: 803 cells/uL (ref 200–950)
NEUTROS ABS: 4307 {cells}/uL (ref 1500–7800)
Neutrophils Relative %: 59 %
PLATELETS: 212 10*3/uL (ref 140–400)
RBC: 3.52 MIL/uL — AB (ref 4.20–5.80)
RDW: 14.9 % (ref 11.0–15.0)
WBC: 7.3 10*3/uL (ref 3.8–10.8)

## 2016-02-28 LAB — HEPATIC FUNCTION PANEL
ALBUMIN: 3.4 g/dL — AB (ref 3.6–5.1)
ALK PHOS: 76 U/L (ref 40–115)
ALT: 10 U/L (ref 9–46)
AST: 13 U/L (ref 10–35)
BILIRUBIN DIRECT: 0.1 mg/dL (ref <=0.2)
BILIRUBIN TOTAL: 0.3 mg/dL (ref 0.2–1.2)
Indirect Bilirubin: 0.2 mg/dL (ref 0.2–1.2)
Total Protein: 6.5 g/dL (ref 6.1–8.1)

## 2016-02-28 LAB — LIPID PANEL
CHOL/HDL RATIO: 3.8 ratio (ref <=5.0)
Cholesterol: 135 mg/dL (ref 125–200)
HDL: 36 mg/dL — AB (ref >=40)
LDL Cholesterol: 83 mg/dL (ref <130)
TRIGLYCERIDES: 78 mg/dL (ref <150)
VLDL: 16 mg/dL (ref <30)

## 2016-02-28 LAB — HEMOGLOBIN A1C
Hgb A1c MFr Bld: 5.8 % — ABNORMAL HIGH (ref <5.7)
Mean Plasma Glucose: 120 mg/dL

## 2016-02-28 LAB — MAGNESIUM: MAGNESIUM: 2.3 mg/dL (ref 1.5–2.5)

## 2016-02-28 LAB — URIC ACID: URIC ACID, SERUM: 4.4 mg/dL (ref 4.0–7.8)

## 2016-02-28 LAB — TSH: TSH: 0.81 m[IU]/L (ref 0.40–4.50)

## 2016-02-29 LAB — INSULIN, RANDOM: INSULIN: 9.1 u[IU]/mL (ref 2.0–19.6)

## 2016-02-29 LAB — VITAMIN D 25 HYDROXY (VIT D DEFICIENCY, FRACTURES): Vit D, 25-Hydroxy: 60 ng/mL (ref 30–100)

## 2016-05-30 ENCOUNTER — Ambulatory Visit (INDEPENDENT_AMBULATORY_CARE_PROVIDER_SITE_OTHER): Payer: Medicare Other | Admitting: Physician Assistant

## 2016-05-30 ENCOUNTER — Other Ambulatory Visit: Payer: Self-pay | Admitting: Physician Assistant

## 2016-05-30 ENCOUNTER — Encounter: Payer: Self-pay | Admitting: Physician Assistant

## 2016-05-30 ENCOUNTER — Other Ambulatory Visit: Payer: Self-pay

## 2016-05-30 VITALS — BP 140/66 | HR 84 | Temp 98.4°F | Resp 14 | Ht 68.0 in | Wt 257.4 lb

## 2016-05-30 DIAGNOSIS — E559 Vitamin D deficiency, unspecified: Secondary | ICD-10-CM

## 2016-05-30 DIAGNOSIS — C649 Malignant neoplasm of unspecified kidney, except renal pelvis: Secondary | ICD-10-CM

## 2016-05-30 DIAGNOSIS — Z79899 Other long term (current) drug therapy: Secondary | ICD-10-CM | POA: Diagnosis not present

## 2016-05-30 DIAGNOSIS — C4491 Basal cell carcinoma of skin, unspecified: Secondary | ICD-10-CM

## 2016-05-30 DIAGNOSIS — R059 Cough, unspecified: Secondary | ICD-10-CM

## 2016-05-30 DIAGNOSIS — E782 Mixed hyperlipidemia: Secondary | ICD-10-CM

## 2016-05-30 DIAGNOSIS — I700 Atherosclerosis of aorta: Secondary | ICD-10-CM | POA: Diagnosis not present

## 2016-05-30 DIAGNOSIS — R7309 Other abnormal glucose: Secondary | ICD-10-CM | POA: Diagnosis not present

## 2016-05-30 DIAGNOSIS — N184 Chronic kidney disease, stage 4 (severe): Secondary | ICD-10-CM

## 2016-05-30 DIAGNOSIS — I10 Essential (primary) hypertension: Secondary | ICD-10-CM | POA: Diagnosis not present

## 2016-05-30 DIAGNOSIS — R05 Cough: Secondary | ICD-10-CM

## 2016-05-30 DIAGNOSIS — L97311 Non-pressure chronic ulcer of right ankle limited to breakdown of skin: Secondary | ICD-10-CM

## 2016-05-30 DIAGNOSIS — E039 Hypothyroidism, unspecified: Secondary | ICD-10-CM

## 2016-05-30 DIAGNOSIS — M7021 Olecranon bursitis, right elbow: Secondary | ICD-10-CM

## 2016-05-30 LAB — BASIC METABOLIC PANEL WITH GFR
BUN: 55 mg/dL — ABNORMAL HIGH (ref 7–25)
CALCIUM: 8.6 mg/dL (ref 8.6–10.3)
CO2: 17 mmol/L — ABNORMAL LOW (ref 20–31)
CREATININE: 2.56 mg/dL — AB (ref 0.70–1.11)
Chloride: 106 mmol/L (ref 98–110)
GFR, EST AFRICAN AMERICAN: 25 mL/min — AB (ref >=60)
GFR, EST NON AFRICAN AMERICAN: 22 mL/min — AB (ref >=60)
GLUCOSE: 112 mg/dL — AB (ref 65–99)
Potassium: 4.6 mmol/L (ref 3.5–5.3)
SODIUM: 137 mmol/L (ref 135–146)

## 2016-05-30 LAB — CBC WITH DIFFERENTIAL/PLATELET
BASOS ABS: 80 {cells}/uL (ref 0–200)
Basophils Relative: 1 %
EOS PCT: 5 %
Eosinophils Absolute: 400 cells/uL (ref 15–500)
HCT: 31.6 % — ABNORMAL LOW (ref 38.5–50.0)
Hemoglobin: 9.9 g/dL — ABNORMAL LOW (ref 13.2–17.1)
LYMPHS PCT: 25 %
Lymphs Abs: 2000 cells/uL (ref 850–3900)
MCH: 30.8 pg (ref 27.0–33.0)
MCHC: 31.3 g/dL — AB (ref 32.0–36.0)
MCV: 98.4 fL (ref 80.0–100.0)
MONOS PCT: 12 %
MPV: 9.8 fL (ref 7.5–12.5)
Monocytes Absolute: 960 cells/uL — ABNORMAL HIGH (ref 200–950)
NEUTROS PCT: 57 %
Neutro Abs: 4560 cells/uL (ref 1500–7800)
Platelets: 194 10*3/uL (ref 140–400)
RBC: 3.21 MIL/uL — AB (ref 4.20–5.80)
RDW: 15.7 % — AB (ref 11.0–15.0)
WBC: 8 10*3/uL (ref 3.8–10.8)

## 2016-05-30 LAB — HEPATIC FUNCTION PANEL
ALT: 11 U/L (ref 9–46)
AST: 13 U/L (ref 10–35)
Albumin: 3.3 g/dL — ABNORMAL LOW (ref 3.6–5.1)
Alkaline Phosphatase: 71 U/L (ref 40–115)
BILIRUBIN DIRECT: 0.1 mg/dL (ref <=0.2)
BILIRUBIN INDIRECT: 0.1 mg/dL — AB (ref 0.2–1.2)
TOTAL PROTEIN: 6.2 g/dL (ref 6.1–8.1)
Total Bilirubin: 0.2 mg/dL (ref 0.2–1.2)

## 2016-05-30 LAB — LIPID PANEL
CHOLESTEROL: 132 mg/dL (ref 125–200)
HDL: 38 mg/dL — ABNORMAL LOW (ref >=40)
LDL Cholesterol: 81 mg/dL (ref <130)
TRIGLYCERIDES: 63 mg/dL (ref <150)
Total CHOL/HDL Ratio: 3.5 Ratio (ref <=5.0)
VLDL: 13 mg/dL (ref <30)

## 2016-05-30 LAB — HEMOGLOBIN A1C
Hgb A1c MFr Bld: 5.9 % — ABNORMAL HIGH (ref <5.7)
MEAN PLASMA GLUCOSE: 123 mg/dL

## 2016-05-30 LAB — TSH: TSH: 0.78 m[IU]/L (ref 0.40–4.50)

## 2016-05-30 LAB — MAGNESIUM: MAGNESIUM: 2.3 mg/dL (ref 1.5–2.5)

## 2016-05-30 MED ORDER — DICLOFENAC SODIUM 1 % TD GEL: 4.0000 g | Freq: Four times a day (QID) | TRANSDERMAL | Status: DC

## 2016-05-30 MED ORDER — PRAVASTATIN SODIUM 40 MG PO TABS: 40.0000 mg | ORAL_TABLET | Freq: Every day | ORAL | Status: DC

## 2016-05-30 MED ORDER — LOSARTAN POTASSIUM 50 MG PO TABS: 50.0000 mg | ORAL_TABLET | Freq: Every day | ORAL | Status: DC

## 2016-05-30 NOTE — Patient Instructions (Addendum)
For your right ankle we are going to get a test to check your pulses Please mix betadine and sugar to a slurry and put it on your ankle once daily for 1-2 weeks, do this in the morning and wrap it.   Please ice your elbow and put on the antiinflammatory cream Do not put pressure on that elbow Can wrap with pressure wrap  Please pick one of the over the counter allergy medications below and take it once daily for allergies.  Claritin or loratadine cheapest but likely the weakest  Zyrtec or certizine at night because it can make you sleepy The strongest is allegra or fexafinadine  Cheapest at [employer], [employer], [employer]  Get chest x ray  Venous Ulcer A venous ulcer is a shallow sore on your lower leg that is caused by poor circulation in your veins. This condition used to be called stasis ulcer.  Veins have valves that help return blood to the heart. If these valves do not work properly, it can cause blood to flow backward and to back up into the veins near the skin. When that happens, blood can pool in your lower legs. The blood can then leak out of your veins, which can irritate your skin. This may cause a break in your skin that becomes a venous ulcer. Venous ulcer is the most common type of lower leg ulcer. You may have venous ulcers on one leg or on both legs. The area where this condition most commonly develops is around the ankles. A venous ulcer may last for a long time (chronic ulcer) or it may return repeatedly (recurrent ulcer). CAUSES Any condition that causes poor circulation to your legs can lead to a venous ulcer.  RISK FACTORS This condition is more likely to develop in:  People who are 16 years of age or older.  People who are overweight.  People who are not active.  People who have had a leg ulcer in the past.  People who have clots in their lower leg veins (deep vein thrombosis).  People who have inflammation of their leg veins (phlebitis).  Women who have given  birth.  People who smoke. SYMPTOMS  The most common symptom of this condition is an open sore near your ankle. Other symptoms may include:  Swelling.  Thickening of the skin.  Fluid leaking from the ulcer.  Bleeding.  Itching.  Pain and swelling that gets worse when you stand up and feels better when you raise your leg.  Blotchy skin.  Darkening of the skin. DIAGNOSIS  Your health care provider may suspect a venous ulcer based on your medical history and your risk factors. Your health care provider will check the skin on your legs. Other tests may be done to learn more about the ulcer and to determine the best way to treat it. Tests that may be done include:  Measuring the blood pressure in your arms and legs.  Using sound waves (ultrasound) to measure the blood flow in your leg veins. TREATMENT You may need to try several different types of treatment to get your venous ulcer to heal. Healing may take a long time. Treatment may include:  Keeping your leg raised (elevated).  Wearing a type of bandage or stocking to compress the veins of your leg (compression therapy). Venous wounds are not likely to heal or to stay healed without compression.  Taking medicines to improve blood flow.  Taking antibiotic medicines to treat infection.  Cleaning your ulcer and removing  any dead tissue from the wound (debridement).  Placing various types of medicated bandage (dressings) or medicated wraps on your ulcer. This helps the ulcer to heal and helps to prevent infection. Surgery is sometimes needed to close the wound using a piece of skin taken from another area of your body (graft). You may need surgery if other treatments are not working or if your ulcer is very deep. HOME CARE INSTRUCTIONS Wound Care  Follow instructions from your health care provider about:  How to take care of your wound.  When and how you should change your bandage (dressing).  When you should remove your  dressing. If your dressing is dry and sticks to your leg when you try to remove it, moisten or wet the dressing with saline solution or water so that the dressing can be removed without harming your skin or wound tissue.  Check your wound every day for signs of infection. Have a caregiver do this for you if you are not able to do it yourself. Check for:  More redness, swelling, or pain. More fluid or blood.  Pus, warmth, or a bad smell. Medicines   Take over-the-counter and prescription medicines only as told by your health care provider.  If you were prescribed an antibiotic medicine, take it or apply it as told by your health care provider. Do not stop taking or using the antibiotic even if your condition improves. Activity  Do not stand or sit in one position for a long period of time. Rest with your legs raised during the day. If possible, keep your legs above your heart for 30 minutes, 3-4 times a day, or as told by your health care provider.  Do not sit with your legs crossed.   Walk often to increase the blood flow in your legs.Ask your health care provider what level of activity is safe for you.  If you are taking a long ride in a car or plane, take a break to walk around at least once every two hours, or as often as your health care provider recommends. Ask your health care provider if you should take aspirin before long trips.  General Instructions  Wear elastic stockings, compression stockings, or support hose as told by your health care provider. This is very important.  Raise the foot of your bed as told by your health care provider.  Do not smoke.  Keep all follow-up visits as told by your health care provider. This is important. SEEK MEDICAL CARE IF:   You have a fever.   Your ulcer is getting larger or is not healing.   Your pain gets worse.   You have more redness or swelling around your ulcer.  You have more fluid, blood, or pus coming from your ulcer  after it has been cleaned by you or your health care provider.  You have warmth or a bad smell coming from your ulcer.   This information is not intended to replace advice given to you by your health care provider. Make sure you discuss any questions you have with your health care provider.   Document Released: 07/22/2001 Document Revised: 07/18/2015 Document Reviewed: 03/07/2015 Elsevier Interactive Patient Education 2016 [employer]   Elbow Bursitis Elbow bursitis is inflammation of the fluid-filled sac (bursa) between the tip of your elbow bone (olecranon) and your skin. Elbow bursitis may also be called olecranon bursitis. Normally, the olecranon bursa has only a small amount of fluid in it to cushion and protect your  elbow bone. Elbow bursitis causes fluid to build up inside the bursa. Over time, this swelling and inflammation can cause pain when you bend or lean on your elbow.  CAUSES Elbow bursitis may be caused by:   Elbow injury (acute trauma).  Leaning on hard surfaces for long periods of time.  Infection from an injury that breaks the skin near your elbow.  A bone growth (spur) that forms at the tip of your elbow.  A medical condition that causes inflammation in your body, such as gout or rheumatoid arthritis.  The cause may also be unknown.  SIGNS AND SYMPTOMS  The first sign of elbow bursitis is usually swelling over the tip of your elbow. This can grow to be the size of a golf ball. This may start suddenly or develop gradually. You may also have:  Pain when bending or leaning on your elbow.  Restricted movement of your elbow.  If your bursitis is caused by an infection, symptoms may also include:  Redness, warmth, and tenderness of the elbow.  Drainage of pus from the swollen area over your elbow, if the skin breaks open. DIAGNOSIS  Your health care provider may be able to diagnose elbow bursitis based on your signs and symptoms, especially if you have  recently been injured. Your health care provider will also do a physical exam. This may include:  X-rays to look for a bone spur or a bone fracture.  Draining fluid from the bursa to test it for infection.  Blood tests to rule out gout or rheumatoid arthritis. TREATMENT  Treatment for elbow bursitis depends on the cause. Treatment may include:  Medicines. These may include:  Over-the-counter medicines to relieve pain and inflammation.  Antibiotic medicines to fight infection.  Injections of anti-inflammatory medicines (steroids).  Wrapping your elbow with a bandage.  Draining fluid from the bursa.  Wearing elbow pads.  If your bursitis does not get better with treatment, surgery may be needed to remove the bursa.  HOME CARE INSTRUCTIONS   Take medicines only as directed by your health care provider.  If you were prescribed an antibiotic medicine, finish all of it even if you start to feel better.  If your bursitis is caused by an injury, rest your elbow and wear your bandage as directed by your health care provider. You may alsoapply ice to the injured area as directed by your health care provider:  Put ice in a plastic bag.  Place a towel between your skin and the bag.  Leave the ice on for 20 minutes, 2-3 times per day.  Avoid any activities that cause elbow pain.  Use elbow pads or elbow wraps to cushion your elbow. SEEK MEDICAL CARE IF:  You have a fever.   Your symptoms do not get better with treatment.  Your pain or swelling gets worse.  Your elbow pain or swelling goes away and then returns.  You have drainage of pus from the swollen area over your elbow.   This information is not intended to replace advice given to you by your health care provider. Make sure you discuss any questions you have with your health care provider.   Document Released: 11/26/2006 Document Revised: 11/17/2014 Document Reviewed: 07/05/2014 Elsevier Interactive Patient  Education [employer].

## 2016-05-30 NOTE — Progress Notes (Signed)
Patient ID: [name], male   DOB: 1929/01/25, 82 y.o.   MRN: [id number] Assessment and Plan:  Hypertension:   -Continue medication,   -recommended taking fluid pill earlier to prevent night time urination  -monitor blood pressure at home.   -Continue DASH diet.    -Reminder to go to the ER if any CP, SOB, nausea, dizziness, severe HA, changes vision/speech, left arm numbness and tingling, and jaw pain.  Cholesterol:  -Continue diet and exercise.   -Check cholesterol.   Pre-diabetes:  -Continue diet   -Check A1C  Vitamin D Def:  -check level  -continue medications.   Medication management -     Magnesium  Hypothyroidism, unspecified hypothyroidism type Hypothyroidism-check TSH level, continue medications the same, reminded to take on an empty stomach 30-35mns before food.  - wants decrease med if possible -     TSH  CKD, stage IV (GFR 28 ml/min) (HCC) -     BASIC METABOLIC PANEL WITH GFR -  avoid NSAIDS, monitor sugars, will monitor  Cancer of kidney, unspecified laterality (HCC) -     BASIC METABOLIC PANEL WITH GFR - Continue follow up Dr. SAlen Blew Basal cell carcinoma -     Ambulatory referral to Dermatology  Olecranon bursitis, right without evidence of infection -     diclofenac sodium (VOLTAREN) 1 % GEL; Apply 4 g topically 4 (four) times daily. - RICE, voltaren gel since can not tolerate oral NSAIDS, if not better ortho  Ulcer of right ankle, limited to breakdown of skin (HCC) -     VAS [place] WITH/WO TBI; Future - ? Pressure versus arterial, has decreased pulses, will get ABI - Do betadine/sugar solution, ulcer is healing, if not better in 2-4 weeks will refer wound clinic  Atherosclerosis of aorta ([place]  - Control blood pressure, cholesterol, glucose, increase exercise.   Cough -     DG Chest 2 View; Future - Get on allergy pill   Continue diet and meds as discussed. Further disposition pending results of labs.  HPI 82y.o. male  presents for 3  month follow up with hypertension, hyperlipidemia, prediabetes and vitamin D.   His blood pressure has been controlled at home, today their BP is BP: 140/66 mmHg.   He does not workout. He denies chest pain, shortness of breath, dizziness.  He complains of new fast growing nodule on his left cheek, non healing, x 5 weeks.   He has history of CKD due to renal cell cancer and HTN, follows with Dr. SAlen Blew  Lab Results  Component Value Date   GFRNONAA 30* 02/28/2016    He is on cholesterol medication and denies myalgias. His cholesterol is not at goal. The cholesterol last visit was:   Lab Results  Component Value Date   CHOL 135 02/28/2016   HDL 36* 02/28/2016   LDLCALC 83 02/28/2016   TRIG 78 02/28/2016   CHOLHDL 3.8 02/28/2016    He has not been working on diet and exercise for prediabetes, and denies foot ulcerations, hyperglycemia, hypoglycemia , increased appetite, nausea, paresthesia of the feet, polydipsia, polyuria, visual disturbances, vomiting and weight loss. Last A1C in the office was:  Lab Results  Component Value Date   HGBA1C 5.8* 02/28/2016   Patient is on Vitamin D supplement.  Lab Results  Component Value Date   VD25OH 60 02/28/2016     He is on thyroid medication. His medication was not changed last visit, he is on one pill daily but  1.'5mg'$  on Sunday, complains of having to go to the bathroom too much.    Lab Results  Component Value Date   TSH 0.81 02/28/2016  .  BMI is Body mass index is 39.15 kg/(m^2)., he is working on diet and exercise. Wt Readings from Last 3 Encounters:  05/30/16 257 lb 6.4 oz (116.756 kg)  02/28/16 250 lb (113.399 kg)  01/17/16 255 lb 8 oz (115.894 kg)     Current Medications:  Current Outpatient Prescriptions on File Prior to Visit  Medication Sig Dispense Refill  . allopurinol (ZYLOPRIM) 300 MG tablet TAKE 1 TABLET DAILY 90 tablet 1  . amLODipine (NORVASC) 5 MG tablet Take 1 tablet (5 mg total) by mouth daily before breakfast.  90 tablet 3  . aspirin EC 81 MG tablet Take 81 mg by mouth daily.    . bumetanide (BUMEX) 2 MG tablet TAKE ONE-HALF (1/2) TO ONE TABLET DAILY FOR FLUID 90 tablet 2  . Cholecalciferol (VITAMIN D3) 1000 UNITS CAPS Take 2,000 capsules by mouth daily.     . diclofenac sodium (VOLTAREN) 1 % GEL Apply topically 4 (four) times daily.    . famotidine (PEPCID) 40 MG tablet Take 40 mg by mouth daily as needed. stomach    . FERROUS SULFATE PO Take 1 tablet by mouth 2 (two) times daily.     Marland Kitchen levothyroxine (SYNTHROID, LEVOTHROID) 150 MCG tablet Take 1 pill daily but take 1.5 pills on Sunday, take 1 hour before food, only with water. 100 tablet 1  . Magnesium 250 MG TABS Take 500 mg by mouth daily.     . Multiple Vitamin (MULITIVITAMIN WITH MINERALS) TABS Take 1 tablet by mouth daily.    . nitroGLYCERIN (NITROSTAT) 0.4 MG SL tablet Place 0.4 mg under the tongue every 5 (five) minutes as needed for chest pain.     . traMADol (ULTRAM) 50 MG tablet TAKE 1 TABLET BY MOUTH UP TO 3 TIMES DAILY AS NEEDED FOR SEVERE PAIN 90 tablet 5  . vitamin C (ASCORBIC ACID) 500 MG tablet Take 500 mg by mouth 2 (two) times daily.     No current facility-administered medications on file prior to visit.    Medical History:  Past Medical History  Diagnosis Date  . Hard of hearing   . Coronary artery disease   . Hearing deficit     hearing aids- bilateral  . Anemia   . Arthritis     stenosis - back, neck  . Cancer [facility])     renal neoplasm  . GERD (gastroesophageal reflux disease)   . Hypertension     sees Dr. Wynonia Lawman, stress test recently for prep for surgery  . Chronic kidney disease     renalCa- nephrectomy [facility]) 04/2012  . Hypothyroidism   . Vitamin D deficiency   . Prediabetes   . 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  HENT: Negative for congestion, ear pain and sore throat.   Eyes: Negative.   Respiratory: Positive for cough and sputum production. Negative for shortness of breath and wheezing.   Cardiovascular: Positive for leg swelling. Negative for chest pain and palpitations.  Gastrointestinal: Positive for constipation. Negative for heartburn, nausea, diarrhea, blood in stool and melena.  Genitourinary: Negative.   Musculoskeletal: Positive for joint pain. Negative for  myalgias, back pain, falls and neck pain.  Skin: Positive for rash.  Neurological: Negative for dizziness, sensory change, loss of consciousness and headaches.  Psychiatric/Behavioral: Negative for depression. The patient is not nervous/anxious and does not have insomnia.     Family history- Review and unchanged  Social history- Review and unchanged  Physical Exam: BP 140/66 mmHg  Pulse 84  Temp(Src) 98.4 F (36.9 C)  Resp 14  Ht '5\' 8"'$  (1.727 m)  Wt 257 lb 6.4 oz (116.756 kg)  BMI 39.15 kg/m2  SpO2 95% Wt Readings from Last 3 Encounters:  05/30/16 257 lb 6.4 oz (116.756 kg)  02/28/16 250 lb (113.399 kg)  01/17/16 255 lb 8 oz (115.894 kg)    General Appearance: Well nourished well developed, in no apparent distress. Eyes: PERRLA, EOMs, conjunctiva no swelling or erythema ENT/Mouth: Ear canals normal without obstruction, swelling, erythma, discharge.  TMs normal bilaterally.  Oropharynx moist, clear, without exudate, or postoropharyngeal swelling. Neck: Supple, thyroid normal,no cervical adenopathy  Respiratory: Respiratory effort normal, Breath sounds clear A&P without rhonchi, wheeze, or rale.  No retractions, no accessory usage. Cardio:Distant S1S2 with soft 2/6 murmur on LSB. Decreased bilateral TP/DP with good cap refill, 1-2+ edema.  Abdomen: Soft, + BS,  Non tender, no guarding, rebound, hernias, masses. Musculoskeletal: Right elbow with swelling at bursa, no warmth, redness, tenderness Full ROM,  5/5 strength, Patient in wheelchair due to knee pain. Skin: right ankle with lateral malleolus ulcer 1cm x 1.5cm with eschar/scab, some surrounding erythema, no discharge/warmth. Decrease pulses bilateral feet, right worse than left.  Left cheek with 6x7 mm scaly nodule with dried blood superior. Warm, dry without rashes, lesions, scattered ecchymosis.  Neuro: Awake and oriented X 3, Cranial nerves intact. Normal muscle tone, no cerebellar symptoms. Psych: Normal affect, Insight and Judgment appropriate.    Vicie Mutters, PA-C 8:47 AM [facility]

## 2016-06-02 ENCOUNTER — Other Ambulatory Visit: Payer: Self-pay | Admitting: Physician Assistant

## 2016-06-02 DIAGNOSIS — L97311 Non-pressure chronic ulcer of right ankle limited to breakdown of skin: Secondary | ICD-10-CM

## 2016-06-03 ENCOUNTER — Encounter: Payer: Self-pay | Admitting: Physician Assistant

## 2016-06-06 ENCOUNTER — Encounter (HOSPITAL_COMMUNITY): Payer: Medicare Other

## 2016-07-02 ENCOUNTER — Other Ambulatory Visit: Payer: Self-pay | Admitting: *Deleted

## 2016-07-02 MED ORDER — LEVOTHYROXINE SODIUM 150 MCG PO TABS: ORAL_TABLET | ORAL | 1 refills | Status: DC

## 2016-07-04 ENCOUNTER — Ambulatory Visit (INDEPENDENT_AMBULATORY_CARE_PROVIDER_SITE_OTHER): Payer: Medicare Other | Admitting: *Deleted

## 2016-07-04 DIAGNOSIS — Z79899 Other long term (current) drug therapy: Secondary | ICD-10-CM | POA: Diagnosis not present

## 2016-07-04 DIAGNOSIS — R899 Unspecified abnormal finding in specimens from other organs, systems and tissues: Secondary | ICD-10-CM | POA: Diagnosis not present

## 2016-07-04 LAB — CBC WITH DIFFERENTIAL/PLATELET
BASOS ABS: 77 {cells}/uL (ref 0–200)
Basophils Relative: 1 %
EOS ABS: 462 {cells}/uL (ref 15–500)
EOS PCT: 6 %
HCT: 32.4 % — ABNORMAL LOW (ref 38.5–50.0)
HEMOGLOBIN: 10.3 g/dL — AB (ref 13.2–17.1)
Lymphocytes Relative: 26 %
Lymphs Abs: 2002 cells/uL (ref 850–3900)
MCH: 30.8 pg (ref 27.0–33.0)
MCHC: 31.8 g/dL — AB (ref 32.0–36.0)
MCV: 97 fL (ref 80.0–100.0)
MPV: 10.1 fL (ref 7.5–12.5)
Monocytes Absolute: 1001 cells/uL — ABNORMAL HIGH (ref 200–950)
Monocytes Relative: 13 %
NEUTROS ABS: 4158 {cells}/uL (ref 1500–7800)
NEUTROS PCT: 54 %
PLATELETS: 213 10*3/uL (ref 140–400)
RBC: 3.34 MIL/uL — ABNORMAL LOW (ref 4.20–5.80)
RDW: 15.3 % — ABNORMAL HIGH (ref 11.0–15.0)
WBC: 7.7 10*3/uL (ref 3.8–10.8)

## 2016-07-04 LAB — RETICULOCYTES
ABS RETIC: 33400 {cells}/uL (ref 25000–90000)
RBC.: 3.34 MIL/uL — AB (ref 4.20–5.80)
Retic Ct Pct: 1 %

## 2016-07-04 LAB — IRON AND TIBC
%SAT: 15 % (ref 15–60)
Iron: 44 ug/dL — ABNORMAL LOW (ref 50–180)
TIBC: 290 ug/dL (ref 250–425)
UIBC: 246 ug/dL (ref 125–400)

## 2016-07-04 LAB — FERRITIN: FERRITIN: 112 ng/mL (ref 20–380)

## 2016-07-22 ENCOUNTER — Other Ambulatory Visit: Payer: Medicare Other

## 2016-07-22 ENCOUNTER — Other Ambulatory Visit: Payer: Self-pay | Admitting: Oncology

## 2016-07-22 ENCOUNTER — Encounter (HOSPITAL_COMMUNITY): Payer: Self-pay

## 2016-07-22 ENCOUNTER — Ambulatory Visit (HOSPITAL_COMMUNITY)
Admission: RE | Admit: 2016-07-22 | Discharge: 2016-07-22 | Disposition: A | Discharge status: Home / Self Care | Payer: Medicare Other | Source: Ambulatory Visit | Attending: Oncology | Admitting: Oncology

## 2016-07-22 DIAGNOSIS — C642 Malignant neoplasm of left kidney, except renal pelvis: Secondary | ICD-10-CM | POA: Insufficient documentation

## 2016-07-22 DIAGNOSIS — Z905 Acquired absence of kidney: Secondary | ICD-10-CM | POA: Diagnosis not present

## 2016-07-22 DIAGNOSIS — R911 Solitary pulmonary nodule: Secondary | ICD-10-CM | POA: Insufficient documentation

## 2016-07-22 DIAGNOSIS — C649 Malignant neoplasm of unspecified kidney, except renal pelvis: Secondary | ICD-10-CM

## 2016-07-22 DIAGNOSIS — I700 Atherosclerosis of aorta: Secondary | ICD-10-CM | POA: Insufficient documentation

## 2016-07-24 ENCOUNTER — Telehealth: Payer: Self-pay | Admitting: Oncology

## 2016-07-24 ENCOUNTER — Ambulatory Visit (INDEPENDENT_AMBULATORY_CARE_PROVIDER_SITE_OTHER): Payer: Medicare Other | Admitting: Internal Medicine

## 2016-07-24 ENCOUNTER — Ambulatory Visit (HOSPITAL_BASED_OUTPATIENT_CLINIC_OR_DEPARTMENT_OTHER): Payer: Medicare Other | Admitting: Oncology

## 2016-07-24 ENCOUNTER — Encounter: Payer: Self-pay | Admitting: Internal Medicine

## 2016-07-24 VITALS — BP 150/60 | HR 81 | Temp 98.6°F | Resp 17 | Ht 68.0 in | Wt 259.9 lb

## 2016-07-24 VITALS — BP 146/60 | HR 72 | Temp 97.5°F | Resp 16 | Ht 68.0 in | Wt 259.0 lb

## 2016-07-24 DIAGNOSIS — L03115 Cellulitis of right lower limb: Secondary | ICD-10-CM | POA: Diagnosis not present

## 2016-07-24 DIAGNOSIS — L97311 Non-pressure chronic ulcer of right ankle limited to breakdown of skin: Secondary | ICD-10-CM

## 2016-07-24 DIAGNOSIS — Z23 Encounter for immunization: Secondary | ICD-10-CM | POA: Diagnosis not present

## 2016-07-24 DIAGNOSIS — I10 Essential (primary) hypertension: Secondary | ICD-10-CM | POA: Diagnosis not present

## 2016-07-24 DIAGNOSIS — C642 Malignant neoplasm of left kidney, except renal pelvis: Secondary | ICD-10-CM | POA: Diagnosis not present

## 2016-07-24 DIAGNOSIS — Z79899 Other long term (current) drug therapy: Secondary | ICD-10-CM

## 2016-07-24 DIAGNOSIS — R609 Edema, unspecified: Secondary | ICD-10-CM | POA: Diagnosis not present

## 2016-07-24 DIAGNOSIS — C7801 Secondary malignant neoplasm of right lung: Secondary | ICD-10-CM

## 2016-07-24 DIAGNOSIS — C649 Malignant neoplasm of unspecified kidney, except renal pelvis: Secondary | ICD-10-CM

## 2016-07-24 LAB — CBC WITH DIFFERENTIAL/PLATELET
BASOS ABS: 82 {cells}/uL (ref 0–200)
BASOS PCT: 1 %
EOS PCT: 3 %
Eosinophils Absolute: 246 cells/uL (ref 15–500)
HCT: 32.7 % — ABNORMAL LOW (ref 38.5–50.0)
HEMOGLOBIN: 10.9 g/dL — AB (ref 13.2–17.1)
LYMPHS ABS: 1968 {cells}/uL (ref 850–3900)
Lymphocytes Relative: 24 %
MCH: 31.9 pg (ref 27.0–33.0)
MCHC: 33.3 g/dL (ref 32.0–36.0)
MCV: 95.6 fL (ref 80.0–100.0)
MONOS PCT: 12 %
MPV: 10.2 fL (ref 7.5–12.5)
Monocytes Absolute: 984 cells/uL — ABNORMAL HIGH (ref 200–950)
NEUTROS ABS: 4920 {cells}/uL (ref 1500–7800)
Neutrophils Relative %: 60 %
PLATELETS: 246 10*3/uL (ref 140–400)
RBC: 3.42 MIL/uL — ABNORMAL LOW (ref 4.20–5.80)
RDW: 15.1 % — ABNORMAL HIGH (ref 11.0–15.0)
WBC: 8.2 10*3/uL (ref 3.8–10.8)

## 2016-07-24 MED ORDER — DOXYCYCLINE HYCLATE 100 MG PO CAPS: ORAL_CAPSULE | ORAL | 0 refills | Status: AC

## 2016-07-24 MED ORDER — DOXYCYCLINE HYCLATE 100 MG PO CAPS: ORAL_CAPSULE | ORAL | 0 refills | Status: DC

## 2016-07-24 NOTE — Telephone Encounter (Signed)
Avs report and appointment schedule given to patient, per 07/24/16 los.

## 2016-07-24 NOTE — Progress Notes (Signed)
Hematology and Oncology Follow Up Visit  [name] [id number] 1929/06/22 82 y.o. 07/24/2016 3:24 PM [name], MDMcKeown, Chad Saxon, MD   Principle Diagnosis: 82 year old gentleman diagnosed with renal cell carcinoma in June 2013. He had a T2a clear cell histology after  presented with painless 9.6 x 9.2 x 7.8 cm left renal neoplasm with central necrosis suspicious for malignancy. He has documented pulmonary metastasis.   Prior Therapy:   He underwent a left laparoscopic radical nephrectomy on 04/29/2012 by Dr. Alinda Money. 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 The margins were negative of tumor without any evidence of local lymphadenopathy. The final pathological staging was T2 1 N0.  He is status post biopsy of right lower lung nodule obtained on 07/30/2015 which confirmed the presence of renal cell carcinoma.  He is status post radiation therapy to an isolated lung metastasis in the right lower lobe. He received total 54 gray in 3 fractions on 10/08/2015, 09/11/2015 and 09/14/2015.  Current therapy: He is on observation and surveillance.  Interim History: [name] presents today for a follow-up visit. Since the last visit, he reports no complaints or illnesses. He denied any dyspnea on exertion, cough or hemoptysis. His performance status is quite limited but unchanged since the last visit. He does have increased lower extremity pain and edema and have developed an scab on his right ankle. He also developed a lesion on his shin and seen dermatology for that. He denies any hematuria or dysuria. He denied any constitutional symptoms. He does not report any decline in his appetite or quality of life.  He does not report any headaches, blurry vision, syncope or seizures. He does not report any fevers, chills, sweats weight loss or appetite changes. He does not report any chest pain. He does not report any nausea,  vomiting, abdominal pain, early satiety, change in his bowel habits or rectal bleeding. He does not report any frequency, urgency, hesitancy or skeletal complaints. He does not report any lymphadenopathy or petechiae. He does report hearing deficits which is unchanged. and the remaining review of system is unremarkable  Medications: I have reviewed the patient's current medications.  Current Outpatient Prescriptions  Medication Sig Dispense Refill  . allopurinol (ZYLOPRIM) 300 MG tablet TAKE 1 TABLET DAILY 90 tablet 1  . amLODipine (NORVASC) 5 MG tablet Take 1 tablet (5 mg total) by mouth daily before breakfast. 90 tablet 3  . aspirin EC 81 MG tablet Take 81 mg by mouth daily.    . bumetanide (BUMEX) 2 MG tablet TAKE ONE-HALF (1/2) TO ONE TABLET DAILY FOR FLUID 90 tablet 2  . Cholecalciferol (VITAMIN D3) 1000 UNITS CAPS Take 2,000 capsules by mouth daily.     . diclofenac sodium (VOLTAREN) 1 % GEL Apply 4 g topically 4 (four) times daily. 100 g 3  . famotidine (PEPCID) 40 MG tablet Take 40 mg by mouth daily as needed. stomach    . FERROUS SULFATE PO Take 1 tablet by mouth 2 (two) times daily.     Marland Kitchen levothyroxine (SYNTHROID, LEVOTHROID) 150 MCG tablet Take 1 pill daily but take 1.5 pills on Sunday, take 1 hour before food, only with water. 100 tablet 1  . losartan (COZAAR) 50 MG tablet Take 1 tablet (50 mg total) by mouth daily. 90 tablet 4  . Magnesium 250 MG TABS Take 500 mg by mouth daily.     . Multiple Vitamin (MULITIVITAMIN WITH MINERALS) TABS  Take 1 tablet by mouth daily.    . pravastatin (PRAVACHOL) 40 MG tablet Take 1 tablet (40 mg total) by mouth at bedtime. 90 tablet 4  . traMADol (ULTRAM) 50 MG tablet TAKE 1 TABLET BY MOUTH UP TO 3 TIMES DAILY AS NEEDED FOR SEVERE PAIN 90 tablet 5  . vitamin C (ASCORBIC ACID) 500 MG tablet Take 500 mg by mouth 2 (two) times daily.    . nitroGLYCERIN (NITROSTAT) 0.4 MG SL tablet Place 0.4 mg under the tongue every 5 (five) minutes as needed for chest  pain.      No current facility-administered medications for this visit.      Allergies: No Known Allergies  Past Medical History, Surgical history, Social history, and Family History were reviewed and updated.   Physical Exam: Blood pressure (!) 150/60, pulse 81, temperature 98.6 F (37 C), temperature source Oral, resp. rate 17, height '5\' 8"'$  (1.727 m), weight 259 lb 14.4 oz (117.9 kg), SpO2 97 %. ECOG: 2 General appearance: alert and cooperative appeared without distress. Head: Normocephalic, without obvious abnormality no oral thrush noted. A skin lesion noted on his left side of the face. Neck: no adenopathy Lymph nodes: Cervical, supraclavicular, and axillary nodes normal. Heart:regular rate and rhythm, S1, S2 normal, no murmur, click, rub or gallop Lung:chest clear, no wheezing, rales, normal symmetric air entry.  Abdomin: soft, non-tender, without masses or organomegaly no shifting dullness or ascites. EXT:no erythema, induration, or nodules   Lab Results: Lab Results  Component Value Date   WBC 7.7 07/04/2016   HGB 10.3 (L) 07/04/2016   HCT 32.4 (L) 07/04/2016   MCV 97.0 07/04/2016   PLT 213 07/04/2016     Chemistry      Component Value Date/Time   NA 137 05/30/2016 0901   NA 137 01/15/2016 0841   K 4.6 05/30/2016 0901   K 4.7 01/15/2016 0841   CL 106 05/30/2016 0901   CO2 17 (L) 05/30/2016 0901   CO2 20 (L) 01/15/2016 0841   BUN 55 (H) 05/30/2016 0901   BUN 49.8 (H) 01/15/2016 0841   CREATININE 2.56 (H) 05/30/2016 0901   CREATININE 2.5 (H) 01/15/2016 0841      Component Value Date/Time   CALCIUM 8.6 05/30/2016 0901   CALCIUM 8.8 01/15/2016 0841   ALKPHOS 71 05/30/2016 0901   ALKPHOS 83 01/15/2016 0841   AST 13 05/30/2016 0901   AST 16 01/15/2016 0841   ALT 11 05/30/2016 0901   ALT 12 01/15/2016 0841   BILITOT 0.2 05/30/2016 0901   BILITOT 0.31 01/15/2016 0841      EXAM: CT CHEST, ABDOMEN AND PELVIS WITHOUT CONTRAST  TECHNIQUE: Multidetector  CT imaging of the chest, abdomen and pelvis was performed following the standard protocol without IV contrast.  COMPARISON:  CT 01/16/2016  FINDINGS: CT CHEST FINDINGS  Cardiovascular: Normal mediastinum and cardiac silhouette. Normal pulmonary vasculature. No evidence of effusion, infiltrate, or pneumothorax. Coronary artery calcification and aortic atherosclerotic calcification.  Mediastinum/Nodes: No axillary or supraclavicular adenopathy. No mediastinal hilar adenopathy. No pericardial fluid. Esophagus normal.  Lungs/Pleura: Again demonstrated consolidation within the RIGHT lower lobe surrounding the treated nodule. The nodular portion appears slightly contracted although difficult to measure. Nodule is estimated at 17 by 18 mm compared to 18 mm by 19 mm on comparison exam. No new pulmonary nodules.  Musculoskeletal: No aggressive osseous lesion. Multiple levels of endplate spurring in the spine.  CT ABDOMEN PELVIS FINDINGS  Hepatobiliary: No focal hepatic lesions noncontrast exam. Post cholecystectomy.  Pancreas: Normal pancreas appear  Spleen: In normal spleen  Adrenals/Urinary Tract: Normal adrenal glands. Patient status post LEFT nephrectomy. No nodularity in nephrectomy bed. RIGHT kidney appears normal on this noncontrast exam. No abnormality the RIGHT ureter. Bladder normal.  Stomach/Bowel: Stomach, small bowel, appendix, and cecum are normal. The colon and rectosigmoid colon are normal.  Vascular/Lymphatic: Abdominal aorta is normal caliber with atherosclerotic calcification. There is no retroperitoneal or periportal lymphadenopathy. No pelvic lymphadenopathy.  Reproductive: Prostate normal  Other: No peritoneal metastasis  Musculoskeletal: No skeletal metastasis. Severe degenerate changes the hips.  IMPRESSION: Chest Impression:  1. Stable postradiation consolidation around the treated RIGHT lower lobe pulmonary nodule. No change  in nodule size with potential mild contraction. 2. No new pulmonary nodules.  Abdomen / Pelvis Impression:  1. Post LEFT nephrectomy without evidence of local recurrence. 2. No evidence of metastasis in the abdomen pelvis. 3. No evidence skeletal metastasis. 4.  Atherosclerotic calcification of the abdominal aorta.  Impression and Plan:  82 year old gentleman with the following issues:  1. Renal cell carcinoma diagnosed in June 2013. He presented with a 9 cm mass and underwent a radical nephrectomy on June 2013. The final pathology showed clear cell renal cell carcinoma with the final pathological staging of T2a without any lymph node involvement. He remained disease-free with imaging studies up to August 2016. Now has stage IV disease.  He is status post radiation therapy to an isolated pulmonary metastasis completed in November 2016.   CT scan obtained on 07/22/2016 showed no evidence of new metastatic lesions. His pulmonary nodules have decreased in size indicating response to therapy.  The plan is to continue with active surveillance and repeat imaging studies in 6 months. Systemic therapy will be implemented 3 develops widespread metastatic disease.   2. 17 mm right lower lung nodule discovered on a CT scan on August 2016. This is biopsy proven to be metastatic renal cell carcinoma. Status post ablative therapy by radiation completed in November 2016 positive response to therapy.  3. Follow-up: Will be in 6 months and repeat imaging studies.     [facility], MD 9/14/20173:24 PM

## 2016-07-24 NOTE — Patient Instructions (Signed)
Increase Bumex (Fluid pill) to 1 whole pill daily

## 2016-07-25 LAB — BASIC METABOLIC PANEL WITH GFR
BUN: 47 mg/dL — ABNORMAL HIGH (ref 7–25)
CHLORIDE: 108 mmol/L (ref 98–110)
CO2: 23 mmol/L (ref 20–31)
Calcium: 8.8 mg/dL (ref 8.6–10.3)
Creat: 2.72 mg/dL — ABNORMAL HIGH (ref 0.70–1.11)
GFR, EST NON AFRICAN AMERICAN: 20 mL/min — AB (ref >=60)
GFR, Est African American: 24 mL/min — ABNORMAL LOW (ref >=60)
Glucose, Bld: 105 mg/dL — ABNORMAL HIGH (ref 65–99)
POTASSIUM: 5.3 mmol/L (ref 3.5–5.3)
SODIUM: 139 mmol/L (ref 135–146)

## 2016-07-25 NOTE — Progress Notes (Signed)
Subjective:    Patient ID: [name], male    DOB: Jun 01, 1929, 82 y.o.   MRN: [id number]  HPI  This 82 yo WWM with HTN, ASCAD, HLD, Gout, Pre DM, Hypothyroidism, Vit D Def and recently discovered recurrent metastatic RCCa metastatic to Lung is brought in today by his daughter for concern about worsening bilateral edema and erythema of his RLE about a large stage 4 pressure ulcer of his R ankle lateral malleolus. Daughter relates his severe Morbid obesity prevents patient from being able to see his ankle wound and she dresses & applies Betadine-Sugar compound/poltice about 1 x/week when she comes to visit and became concerned about the new erythema of his RLE.   Medication Sig  . allopurinol (ZYLOPRIM) 300 MG tablet TAKE 1 TABLET DAILY  . amLODipine (NORVASC) 5 MG tablet Take 1 tablet (5 mg total) by mouth daily before breakfast.  . aspirin EC 81 MG tablet Take 81 mg by mouth daily.  . bumetanide (BUMEX) 2 MG tablet TAKE ONE-HALF (1/2) TO ONE TABLET DAILY FOR FLUID  . Cholecalciferol (VITAMIN D3) 1000 UNITS CAPS Take 2,000 capsules by mouth daily.   . diclofenac sodium (VOLTAREN) 1 % GEL Apply 4 g topically 4 (four) times daily.  Marland Kitchen FERROUS SULFATE PO Take 1 tablet by mouth 2 (two) times daily.   Marland Kitchen levothyroxine (SYNTHROID, LEVOTHROID) 150 MCG tablet Take 1 pill daily but take 1.5 pills on Sunday, take 1 hour before food, only with water.  Marland Kitchen losartan (COZAAR) 50 MG tablet Take 1 tablet (50 mg total) by mouth daily.  . Magnesium 250 MG TABS Take 500 mg by mouth daily.   . Multiple Vitamin (MULITIVITAMIN WITH MINERALS) TABS Take 1 tablet by mouth daily.  . nitroGLYCERIN (NITROSTAT) 0.4 MG SL tablet Place 0.4 mg under the tongue every 5 (five) minutes as needed for chest pain.   . pravastatin (PRAVACHOL) 40 MG tablet Take 1 tablet (40 mg total) by mouth at bedtime.  . traMADol (ULTRAM) 50 MG tablet TAKE 1 TABLET BY MOUTH UP TO 3 TIMES DAILY AS NEEDED FOR SEVERE PAIN  . vitamin C (ASCORBIC ACID)  500 MG tablet Take 500 mg by mouth 2 (two) times daily.  . famotidine (PEPCID) 40 MG tablet Take 40 mg by mouth daily as needed. stomach   No Known Allergies   Past Medical History:  Diagnosis Date  . Anemia   . Arthritis    stenosis - back, neck  . Cancer [facility])    renal neoplasm  . Chronic kidney disease    renalCa- nephrectomy [facility]) 04/2012  . Coronary artery disease   . GERD (gastroesophageal reflux disease)   . Hard of hearing   . Hearing deficit    hearing aids- bilateral  . Hypertension    sees Dr. Wynonia Lawman, stress test recently for prep for surgery  . Hypothyroidism   . Lung cancer ([place])    right lower lobe nodule suspicious for metastatic clear cell renal cell neoplasm  . Prediabetes   . Vitamin D deficiency    Past Surgical History:  Procedure Laterality Date  . ANTERIOR CERVICAL DECOMP/DISCECTOMY FUSION N/A 01/18/2013   Procedure: ANTERIOR CERVICAL DECOMPRESSION/DISCECTOMY FUSION 2 LEVELS;  Surgeon: Faythe Ghee, MD;  Location: [facility];  Service: Neurosurgery;  Laterality: N/A;  . APPENDECTOMY    . BACK SURGERY     lower back surgery - 1990's  . CHOLECYSTECTOMY    . CORONARY ANGIOPLASTY     stents , + 7-34yr.  ago  . EYE SURGERY     right eye cataract surgery   . JOINT REPLACEMENT     bilateral knee replacements   . LAPAROSCOPIC NEPHRECTOMY  04/29/2012   Procedure: LAPAROSCOPIC NEPHRECTOMY;  Surgeon: Dutch Gray, MD;  Location: [facility];  Service: Urology;  Laterality: Left;      . TONSILLECTOMY     Review of Systems  10 point systems review negative except as above.    Objective:   Physical Exam  BP146/60   P 72   T 97.5 F    R 16   Ht '5\' 8"'$     Wt 259 lb    BMI 39.38   Chronically ill appearing Morbidly Obese elderly WM  HEENT -WNL. Neck - supple. Nl Thyroid. Carotids 2+ & No bruits, nodes, JVD Chest - Clear equal BS w/o Rales.. Cor - Nl HS. RRR w/o sig M. PP obscured by 2-3(+) edema. Skin-  Noted erythema of the R shin. . No calf  tenderness. 1 x 3 cm stage 4 ulcer of the R lateral malleolus.  MS- FROM w/o deformities. Muscle power, tone and bulk Nl. In W/C.  Neuro - No obvious Cr N abnormalities. Sensory, motor and Cerebellar functions appear Nl w/o focal abnormalities. Psyche - Mental status normal & appropriate.  No delusions, ideations or obvious mood abnormalities.    Assessment & Plan:   1. Essential hypertension   2. Ulcer of right ankle, limited to breakdown of skin (HCC)  - AMB referral to wound care center  3. Edema, venous insufficiency   4. Need for prophylactic vaccination and inoculation against influenza  - Flu vaccine HIGH DOSE PF (Fluzone High dose)  5. Cellulitis of leg, right  - doxycycline (VIBRAMYCIN) 100 MG capsule; Take 1 capsule daily with food for infection  Dispense: 30 capsule; Refill: 0  6. Medication management  - CBC with Differential/Platelet - BASIC METABOLIC PANEL WITH GFR  - ROV 10 days

## 2016-07-31 ENCOUNTER — Encounter: Payer: Medicare Other | Attending: Surgery | Admitting: Surgery

## 2016-07-31 DIAGNOSIS — C7800 Secondary malignant neoplasm of unspecified lung: Secondary | ICD-10-CM | POA: Insufficient documentation

## 2016-07-31 DIAGNOSIS — R7303 Prediabetes: Secondary | ICD-10-CM | POA: Insufficient documentation

## 2016-07-31 DIAGNOSIS — M1990 Unspecified osteoarthritis, unspecified site: Secondary | ICD-10-CM | POA: Insufficient documentation

## 2016-07-31 DIAGNOSIS — L97512 Non-pressure chronic ulcer of other part of right foot with fat layer exposed: Secondary | ICD-10-CM | POA: Insufficient documentation

## 2016-07-31 DIAGNOSIS — I739 Peripheral vascular disease, unspecified: Secondary | ICD-10-CM | POA: Diagnosis not present

## 2016-07-31 DIAGNOSIS — C642 Malignant neoplasm of left kidney, except renal pelvis: Secondary | ICD-10-CM | POA: Diagnosis not present

## 2016-07-31 DIAGNOSIS — I890 Lymphedema, not elsewhere classified: Secondary | ICD-10-CM | POA: Diagnosis not present

## 2016-07-31 DIAGNOSIS — Z79899 Other long term (current) drug therapy: Secondary | ICD-10-CM | POA: Diagnosis not present

## 2016-07-31 DIAGNOSIS — I2510 Atherosclerotic heart disease of native coronary artery without angina pectoris: Secondary | ICD-10-CM | POA: Insufficient documentation

## 2016-07-31 DIAGNOSIS — L97312 Non-pressure chronic ulcer of right ankle with fat layer exposed: Secondary | ICD-10-CM | POA: Insufficient documentation

## 2016-07-31 DIAGNOSIS — I10 Essential (primary) hypertension: Secondary | ICD-10-CM | POA: Diagnosis not present

## 2016-07-31 DIAGNOSIS — Z6838 Body mass index (BMI) 38.0-38.9, adult: Secondary | ICD-10-CM | POA: Insufficient documentation

## 2016-07-31 DIAGNOSIS — Z87891 Personal history of nicotine dependence: Secondary | ICD-10-CM | POA: Diagnosis not present

## 2016-07-31 DIAGNOSIS — M21611 Bunion of right foot: Secondary | ICD-10-CM | POA: Insufficient documentation

## 2016-07-31 DIAGNOSIS — E669 Obesity, unspecified: Secondary | ICD-10-CM | POA: Diagnosis not present

## 2016-07-31 DIAGNOSIS — E7800 Pure hypercholesterolemia, unspecified: Secondary | ICD-10-CM | POA: Insufficient documentation

## 2016-07-31 DIAGNOSIS — M109 Gout, unspecified: Secondary | ICD-10-CM | POA: Insufficient documentation

## 2016-07-31 NOTE — Progress Notes (Signed)
WEBER, MONNIER ([id number]) Visit Report for 07/31/2016 Allergy List Details Patient Name: [name], [name]. Date of Service: 07/31/2016 8:45 AM Medical Record Number: [id number] Patient Account Number: [id number] Date of Birth/Sex: 05-20-1929 (82 y.o. Male) Treating RN: Baruch Gouty, RN, BSN, Velva Harman Primary Care Physician: Unk Pinto Other Clinician: Referring Physician: Unk Pinto Treating Physician/Extender: Frann Rider in Treatment: 0 Allergies Active Allergies No known allergies Allergy Notes Electronic Signature(s) Signed: 07/31/2016 2:51:11 PM By: Regan Lemming BSN, RN Entered By: Regan Lemming on 07/31/2016 08:52:28 [name] ([id number]) -------------------------------------------------------------------------------- Arrival Information Details Patient Name: [name] Date of Service: 07/31/2016 8:45 AM Medical Record Number: [id number] Patient Account Number: [id number] Date of Birth/Sex: Jul 03, 1929 (82 y.o. Male) Treating RN: Baruch Gouty, RN, BSN, Velva Harman Primary Care Physician: Unk Pinto Other Clinician: Referring Physician: Unk Pinto Treating Physician/Extender: Frann Rider in Treatment: 0 Visit Information Patient Arrived: Wheel Chair Arrival Time: 08:46 Accompanied By: dtr Transfer Assistance: None Patient Identification Verified: Yes Secondary Verification Process Yes Completed: Patient Requires Transmission- No Based Precautions: Patient Has Alerts: Yes Patient Alerts: Patient on Blood Thinner ASA Electronic Signature(s) Signed: 07/31/2016 2:51:11 PM By: Regan Lemming BSN, RN Entered By: Regan Lemming on 07/31/2016 08:47:19 [name] ([id number]) -------------------------------------------------------------------------------- Clinic Level of Care Assessment Details Patient Name: [name] Date of Service: 07/31/2016 8:45 AM Medical Record Number: [id number] Patient Account Number: [id number] Date of  Birth/Sex: [birth year] (82 y.o. Male) Treating RN: Baruch Gouty, RN, BSN, Velva Harman Primary Care Physician: Unk Pinto Other Clinician: Referring Physician: Unk Pinto Treating Physician/Extender: Frann Rider in Treatment: 0 Clinic Level of Care Assessment Items TOOL 1 Quantity Score '[]'$  - Use when EandM and Procedure is performed on INITIAL visit 0 ASSESSMENTS - Nursing Assessment / Reassessment X - General Physical Exam (combine w/ comprehensive assessment (listed just 1 20 below) when performed on new pt. evals) X - Comprehensive Assessment (HX, ROS, Risk Assessments, Wounds Hx, etc.) 1 25 ASSESSMENTS - Wound and Skin Assessment / Reassessment '[]'$  - Dermatologic / Skin Assessment (not related to wound area) 0 ASSESSMENTS - Ostomy and/or Continence Assessment and Care '[]'$  - Incontinence Assessment and Management 0 '[]'$  - Ostomy Care Assessment and Management (repouching, etc.) 0 PROCESS - Coordination of Care X - Simple Patient / Family Education for ongoing care 1 15 '[]'$  - Complex (extensive) Patient / Family Education for ongoing care 0 X - Staff obtains Programmer, systems, Records, Test Results / Process Orders 1 10 '[]'$  - Staff telephones HHA, Nursing Homes / Clarify orders / etc 0 '[]'$  - Routine Transfer to another Facility (non-emergent condition) 0 '[]'$  - Routine Hospital Admission (non-emergent condition) 0 X - New Admissions / Biomedical engineer / Ordering NPWT, Apligraf, etc. 1 15 '[]'$  - Emergency Hospital Admission (emergent condition) 0 PROCESS - Special Needs '[]'$  - Pediatric / Minor Patient Management 0 '[]'$  - Isolation Patient Management 0 [name], [name]. ([id number]) '[]'$  - Hearing / Language / Visual special needs 0 '[]'$  - Assessment of Community assistance (transportation, D/C planning, etc.) 0 '[]'$  - Additional assistance / Altered mentation 0 '[]'$  - Support Surface(s) Assessment (bed, cushion, seat, etc.) 0 INTERVENTIONS - Miscellaneous '[]'$  - External ear exam 0 '[]'$  - Patient  Transfer (multiple staff / Civil Service fast streamer / Similar devices) 0 '[]'$  - Simple Staple / Suture removal (25 or less) 0 '[]'$  - Complex Staple / Suture removal (26 or more) 0 '[]'$  - Hypo/Hyperglycemic Management (do not check if billed separately) 0 X - Ankle / Brachial Index (ABI) - do not check if  billed separately 1 15 Has the patient been seen at the hospital within the last three years: Yes Total Score: 100 Level Of Care: New/Established - Level 3 Electronic Signature(s) Signed: 07/31/2016 2:51:11 PM By: Regan Lemming BSN, RN Entered By: Regan Lemming on 07/31/2016 09:42:19 [name] ([id number]) -------------------------------------------------------------------------------- Encounter Discharge Information Details Patient Name: [name], [name]. Date of Service: 07/31/2016 8:45 AM Medical Record Number: [id number] Patient Account Number: [id number] Date of Birth/Sex: 02-Sep-1929 (82 y.o. Male) Treating RN: Cornell Barman Primary Care Physician: Unk Pinto Other Clinician: Referring Physician: Unk Pinto Treating Physician/Extender: Frann Rider in Treatment: 0 Encounter Discharge Information Items Schedule Follow-up Appointment: No Medication Reconciliation completed No and provided to Patient/Care Krisann Mckenna: Provided on Clinical Summary of Care: 07/31/2016 Form Type Recipient Paper Patient TC Electronic Signature(s) Signed: 07/31/2016 9:44:52 AM By: Ruthine Dose Entered By: Ruthine Dose on 07/31/2016 09:44:52 [name] ([id number]) -------------------------------------------------------------------------------- Lower Extremity Assessment Details Patient Name: [name], [name]. Date of Service: 07/31/2016 8:45 AM Medical Record Number: [id number] Patient Account Number: [id number] Date of Birth/Sex: 1929-08-30 (82 y.o. Male) Treating RN: Afful, RN, BSN, [place] Primary Care Physician: Unk Pinto Other Clinician: Referring Physician: Unk Pinto Treating  Physician/Extender: Frann Rider in Treatment: 0 Edema Assessment Assessed: Shirlyn Goltz: No] Patrice Paradise: No] Edema: [Left: Yes] [Right: Yes] Calf Left: Right: Point of Measurement: 34 cm From Medial Instep 42.8 cm 42.6 cm Ankle Left: Right: Point of Measurement: 9 cm From Medial Instep 25.5 cm 24.6 cm Vascular Assessment Claudication: Claudication Assessment [Left:None] [Right:None] Pulses: Posterior Tibial Palpable: [Left:No] [Right:No] Doppler: [Right:Multiphasic] Dorsalis Pedis Palpable: [Left:Yes] [Right:Yes] Doppler: [Right:Multiphasic] Extremity colors, hair growth, and conditions: Extremity Color: [Left:Normal] [Right:Normal] Hair Growth on Extremity: [Left:No] [Right:No] Temperature of Extremity: [Left:Warm] [Right:Warm] Capillary Refill: [Left:< 3 seconds] [Right:< 3 seconds] Blood Pressure: Brachial: [Left:156] [Right:152] Dorsalis Pedis: 160 [Left:Dorsalis Pedis: 536] Ankle: Posterior Tibial: 144 [Left:Posterior Tibial: 1.03] [Right:0.85] Toe Nail Assessment Left: Right: Thick: Yes Yes Discolored: Yes Yes [name], [name] ([id number]) Deformed: No No Improper Length and Hygiene: No Electronic Signature(s) Signed: 07/31/2016 2:51:11 PM By: Regan Lemming BSN, RN Entered By: Regan Lemming on 07/31/2016 09:10:00 [name] ([id number]) -------------------------------------------------------------------------------- Multi Wound Chart Details Patient Name: [name] Date of Service: 07/31/2016 8:45 AM Medical Record Number: [id number] Patient Account Number: [id number] Date of Birth/Sex: 07-18-1929 (82 y.o. Male) Treating RN: Baruch Gouty, RN, BSN, Velva Harman Primary Care Physician: Unk Pinto Other Clinician: Referring Physician: Unk Pinto Treating Physician/Extender: Frann Rider in Treatment: 0 Vital Signs Height(in): 69 Pulse(bpm): 76 Weight(lbs): 259 Blood Pressure 156/60 (mmHg): Body Mass Index(BMI): 38 Temperature(F):  97.6 Respiratory Rate 19 (breaths/min): Photos: [1:No Photos] [2:No Photos] [N/A:N/A] Wound Location: [1:Right Malleolus - Lateral] [2:Right Toe Great - Medial] [N/A:N/A] Wounding Event: [1:Gradually Appeared] [2:Gradually Appeared] [N/A:N/A] Primary Etiology: [1:Venous Leg Ulcer] [2:Pressure Ulcer] [N/A:N/A] Comorbid History: [1:Cataracts, Arrhythmia, Coronary Artery Disease, Hypertension, Gout, Osteoarthritis] [2:Cataracts, Arrhythmia, Coronary Artery Disease, Hypertension, Gout, Osteoarthritis] [N/A:N/A] Date Acquired: [1:06/30/2016] [2:07/03/2016] [N/A:N/A] Weeks of Treatment: [1:0] [2:0] [N/A:N/A] Wound Status: [1:Open] [2:Open] [N/A:N/A] Measurements L x W x D 2x2.5x0.2 [2:0.5x0.5x0.1] [N/A:N/A] (cm) Area (cm) : [1:3.927] [2:0.196] [N/A:N/A] Volume (cm) : [1:0.785] [2:0.02] [N/A:N/A] Classification: [1:Full Thickness Without Exposed Support Structures] [2:Category/Stage II] [N/A:N/A] Exudate Amount: [1:Medium] [2:Medium] [N/A:N/A] Exudate Type: [1:Serosanguineous] [2:Serosanguineous] [N/A:N/A] Exudate Color: [1:red, brown] [2:red, brown] [N/A:N/A] Foul Odor After [1:Yes] [2:No] [N/A:N/A] Cleansing: Odor Anticipated Due to No [2:N/A] [N/A:N/A] Product Use: Wound Margin: [1:Indistinct, nonvisible] [2:Distinct, outline attached] [N/A:N/A] Granulation Amount: [1:Small (1-33%)] [2:Medium (34-66%)] [N/A:N/A] Granulation Quality: [1:Pink, Pale] [2:Pink,  Pale] [N/A:N/A] Necrotic Amount: [1:Large (67-100%)] [2:Small (1-33%)] [N/A:N/A] Necrotic Tissue: [1:Adherent Slough] [2:Eschar, Adherent Slough] [N/A:N/A] Exposed Structures: Fascia: No Fascia: No N/A Fat: No Fat: No Tendon: No Tendon: No Muscle: No Muscle: No Joint: No Joint: No Bone: No Bone: No Limited to Skin Limited to Skin Breakdown Breakdown Epithelialization: None None N/A Periwound Skin Texture: Edema: Yes Edema: No N/A Excoriation: No Excoriation: No Induration: No Induration: No Callus: No Callus:  No Crepitus: No Crepitus: No Fluctuance: No Fluctuance: No Friable: No Friable: No Rash: No Rash: No Scarring: No Scarring: No Periwound Skin Maceration: Yes Moist: Yes N/A Moisture: Moist: Yes Maceration: No Dry/Scaly: No Dry/Scaly: No Periwound Skin Color: Atrophie Blanche: No Atrophie Blanche: No N/A Cyanosis: No Cyanosis: No Ecchymosis: No Ecchymosis: No Erythema: No Erythema: No Hemosiderin Staining: No Hemosiderin Staining: No Mottled: No Mottled: No Pallor: No Pallor: No Rubor: No Rubor: No Temperature: No Abnormality No Abnormality N/A Tenderness on Yes Yes N/A Palpation: Wound Preparation: Ulcer Cleansing: Ulcer Cleansing: N/A Rinsed/Irrigated with Rinsed/Irrigated with Saline Saline Topical Anesthetic Topical Anesthetic Applied: Other: lidocaine Applied: Other: lidocaine 4% 4% Treatment Notes Electronic Signature(s) Signed: 07/31/2016 2:51:11 PM By: Regan Lemming BSN, RN Entered By: Regan Lemming on 07/31/2016 09:18:42 [name] ([id number]) -------------------------------------------------------------------------------- Multi-Disciplinary Care Plan Details Patient Name: [name], [name]. Date of Service: 07/31/2016 8:45 AM Medical Record Number: [id number] Patient Account Number: [id number] Date of Birth/Sex: [birth year] (82 y.o. Male) Treating RN: Baruch Gouty, RN, BSN, Velva Harman Primary Care Physician: Unk Pinto Other Clinician: Referring Physician: Unk Pinto Treating Physician/Extender: Frann Rider in Treatment: 0 Active Inactive Orientation to the Wound Care Program Nursing Diagnoses: Knowledge deficit related to the wound healing center program Goals: Patient/caregiver will verbalize understanding of the [place] Program Date Initiated: 07/31/2016 Goal Status: Active Interventions: Provide education on orientation to the wound center Notes: Venous Leg Ulcer Nursing Diagnoses: Knowledge deficit related to  disease process and management Potential for venous Insuffiency (use before diagnosis confirmed) Goals: Non-invasive venous studies are completed as ordered Date Initiated: 07/31/2016 Goal Status: Active Patient will maintain optimal edema control Date Initiated: 07/31/2016 Goal Status: Active Patient/caregiver will verbalize understanding of disease process and disease management Date Initiated: 07/31/2016 Goal Status: Active Verify adequate tissue perfusion prior to therapeutic compression application Date Initiated: 07/31/2016 Goal Status: Active Interventions: Assess peripheral edema status every visit. [name], [name] ([id number]) Compression as ordered Provide education on venous insufficiency Treatment Activities: Therapeutic compression applied : 07/31/2016 Notes: Wound/Skin Impairment Nursing Diagnoses: Impaired tissue integrity Knowledge deficit related to ulceration/compromised skin integrity Goals: Patient/caregiver will verbalize understanding of skin care regimen Date Initiated: 07/31/2016 Goal Status: Active Ulcer/skin breakdown will have a volume reduction of 30% by week 4 Date Initiated: 07/31/2016 Goal Status: Active Ulcer/skin breakdown will have a volume reduction of 50% by week 8 Date Initiated: 07/31/2016 Goal Status: Active Ulcer/skin breakdown will have a volume reduction of 80% by week 12 Date Initiated: 07/31/2016 Goal Status: Active Ulcer/skin breakdown will heal within 14 weeks Date Initiated: 07/31/2016 Goal Status: Active Interventions: Assess patient/caregiver ability to obtain necessary supplies Assess patient/caregiver ability to perform ulcer/skin care regimen upon admission and as needed Assess ulceration(s) every visit Provide education on ulcer and skin care Treatment Activities: Referred to DME Liley Rake for dressing supplies : 07/31/2016 Skin care regimen initiated : 07/31/2016 Topical wound management initiated :  07/31/2016 Notes: Electronic Signature(s) [name], [name] ([id number]) Signed: 07/31/2016 2:51:11 PM By: Regan Lemming BSN, RN Entered By: Regan Lemming on 07/31/2016 09:18:25 [name]. (  [id number]) -------------------------------------------------------------------------------- Pain Assessment Details Patient Name: [name], [name]. Date of Service: 07/31/2016 8:45 AM Medical Record Number: [id number] Patient Account Number: [id number] Date of Birth/Sex: Aug 20, 1929 (82 y.o. Male) Treating RN: Baruch Gouty, RN, BSN, Velva Harman Primary Care Physician: Unk Pinto Other Clinician: Referring Physician: Unk Pinto Treating Physician/Extender: Frann Rider in Treatment: 0 Active Problems Location of Pain Severity and Description of Pain Patient Has Paino No Site Locations With Dressing Change: No Pain Management and Medication Current Pain Management: Electronic Signature(s) Signed: 07/31/2016 2:51:11 PM By: Regan Lemming BSN, RN Entered By: Regan Lemming on 07/31/2016 08:47:29 [name] ([id number]) -------------------------------------------------------------------------------- Wound Assessment Details Patient Name: [name], [name]. Date of Service: 07/31/2016 8:45 AM Medical Record Number: [id number] Patient Account Number: [id number] Date of Birth/Sex: Feb 07, 1929 (82 y.o. Male) Treating RN: Baruch Gouty, RN, BSN, [place] Primary Care Physician: Unk Pinto Other Clinician: Referring Physician: Unk Pinto Treating Physician/Extender: Frann Rider in Treatment: 0 Wound Status Wound Number: 1 Primary Venous Leg Ulcer Etiology: Wound Location: Right Malleolus - Lateral Wound Open Wounding Event: Gradually Appeared Status: Date Acquired: 06/30/2016 Comorbid Cataracts, Arrhythmia, Coronary Artery Weeks Of Treatment: 0 History: Disease, Hypertension, Gout, Clustered Wound: No Osteoarthritis Photos Photo Uploaded By: Regan Lemming on 07/31/2016 13:57:46 Wound  Measurements Length: (cm) 2 Width: (cm) 2.5 Depth: (cm) 0.2 Area: (cm) 3.927 Volume: (cm) 0.785 % Reduction in Area: % Reduction in Volume: Epithelialization: None Tunneling: No Undermining: No Wound Description Full Thickness Without Exposed Classification: Support Structures Wound Margin: Indistinct, nonvisible Medium ANIVAL, PASHA ([id number]) Foul Odor After Cleansing: Yes Due to Product Use: No Exudate Amount: Exudate Type: Serosanguineous Exudate Color: red, brown Wound Bed Granulation Amount: Small (1-33%) Exposed Structure Granulation Quality: Pink, Pale Fascia Exposed: No Necrotic Amount: Large (67-100%) Fat Layer Exposed: No Necrotic Quality: Adherent Slough Tendon Exposed: No Muscle Exposed: No Joint Exposed: No Bone Exposed: No Limited to Skin Breakdown Periwound Skin Texture Texture Color No Abnormalities Noted: No No Abnormalities Noted: No Callus: No Atrophie Blanche: No Crepitus: No Cyanosis: No Excoriation: No Ecchymosis: No Fluctuance: No Erythema: No Friable: No Hemosiderin Staining: No Induration: No Mottled: No Localized Edema: Yes Pallor: No Rash: No Rubor: No Scarring: No Temperature / Pain Moisture Temperature: No Abnormality No Abnormalities Noted: No Tenderness on Palpation: Yes Dry / Scaly: No Maceration: Yes Moist: Yes Wound Preparation Ulcer Cleansing: Rinsed/Irrigated with Saline Topical Anesthetic Applied: Other: lidocaine 4%, Electronic Signature(s) Signed: 07/31/2016 2:51:11 PM By: Regan Lemming BSN, RN Entered By: Regan Lemming on 07/31/2016 08:58:05 [name], [name] ([id number]) -------------------------------------------------------------------------------- Wound Assessment Details Patient Name: [name], [name]. Date of Service: 07/31/2016 8:45 AM Medical Record Number: [id number] Patient Account Number: [id number] Date of Birth/Sex: 25-Feb-1929 (82 y.o. Male) Treating RN: Baruch Gouty, RN, BSN, [place] Primary  Care Physician: Unk Pinto Other Clinician: Referring Physician: Unk Pinto Treating Physician/Extender: Frann Rider in Treatment: 0 Wound Status Wound Number: 2 Primary Pressure Ulcer Etiology: Wound Location: Right Toe Great - Medial Wound Open Wounding Event: Gradually Appeared Status: Date Acquired: 07/03/2016 Comorbid Cataracts, Arrhythmia, Coronary Artery Weeks Of Treatment: 0 History: Disease, Hypertension, Gout, Clustered Wound: No Osteoarthritis Photos Photo Uploaded By: Regan Lemming on 07/31/2016 13:57:46 Wound Measurements Length: (cm) 0.5 Width: (cm) 0.5 Depth: (cm) 0.1 Area: (cm) 0.196 Volume: (cm) 0.02 % Reduction in Area: % Reduction in Volume: Epithelialization: None Tunneling: No Undermining: No Wound Description Classification: Category/Stage II Wound Margin: Distinct, outline attached Exudate Amount: Medium Exudate Type: Serosanguineous Exudate Color: red, brown Foul Odor After Cleansing: No Wound Bed Granulation Amount:  Medium (34-66%) Exposed Structure Granulation Quality: Pink, Pale Fascia Exposed: No Necrotic Amount: Small (1-33%) Fat Layer Exposed: No Necrotic Quality: Eschar, Adherent Slough Tendon Exposed: No LEXIE, MORINI ([id number]) Muscle Exposed: No Joint Exposed: No Bone Exposed: No Limited to Skin Breakdown Periwound Skin Texture Texture Color No Abnormalities Noted: No No Abnormalities Noted: No Callus: No Atrophie Blanche: No Crepitus: No Cyanosis: No Excoriation: No Ecchymosis: No Fluctuance: No Erythema: No Friable: No Hemosiderin Staining: No Induration: No Mottled: No Localized Edema: No Pallor: No Rash: No Rubor: No Scarring: No Temperature / Pain Moisture Temperature: No Abnormality No Abnormalities Noted: No Tenderness on Palpation: Yes Dry / Scaly: No Maceration: No Moist: Yes Wound Preparation Ulcer Cleansing: Rinsed/Irrigated with Saline Topical Anesthetic  Applied: Other: lidocaine 4%, Electronic Signature(s) Signed: 07/31/2016 2:51:11 PM By: Regan Lemming BSN, RN Entered By: Regan Lemming on 07/31/2016 09:00:16 DAMEION, BRILES ([id number]) -------------------------------------------------------------------------------- Vitals Details Patient Name: [name] Date of Service: 07/31/2016 8:45 AM Medical Record Number: [id number] Patient Account Number: [id number] Date of Birth/Sex: 05-16-1929 (82 y.o. Male) Treating RN: Baruch Gouty, RN, BSN, [place] Primary Care Physician: Unk Pinto Other Clinician: Referring Physician: Unk Pinto Treating Physician/Extender: Frann Rider in Treatment: 0 Vital Signs Time Taken: 08:51 Temperature (F): 97.6 Height (in): 69 Pulse (bpm): 76 Source: Stated Respiratory Rate (breaths/min): 19 Weight (lbs): 259 Blood Pressure (mmHg): 156/60 Source: Stated Reference Range: 80 - 120 mg / dl Body Mass Index (BMI): 38.2 Electronic Signature(s) Signed: 07/31/2016 2:51:11 PM By: Regan Lemming BSN, RN Entered By: Regan Lemming on 07/31/2016 08:51:55

## 2016-07-31 NOTE — Progress Notes (Signed)
KYROLLOS, CORDELL ([id number]) Visit Report for 07/31/2016 Abuse/Suicide Risk Screen Details Patient Name: MARVIE, CALENDER. Date of Service: 07/31/2016 8:45 AM Medical Record Number: [id number] Patient Account Number: [id number] Date of Birth/Sex: 05-Mar-1929 (82 y.o. Male) Treating RN: Baruch Gouty, RN, BSN, Velva Harman Primary Care Physician: Unk Pinto Other Clinician: Referring Physician: Unk Pinto Treating Physician/Extender: Frann Rider in Treatment: 0 Abuse/Suicide Risk Screen Items Answer ABUSE/SUICIDE RISK SCREEN: Has anyone close to you tried to hurt or harm you recentlyo No Do you feel uncomfortable with anyone in your familyo No Has anyone forced you do things that you didnot want to doo No Do you have any thoughts of harming yourselfo No Patient displays signs or symptoms of abuse and/or neglect. No Electronic Signature(s) Signed: 07/31/2016 2:51:11 PM By: Regan Lemming BSN, RN Entered By: Regan Lemming on 07/31/2016 08:47:40 RULON, ABDALLA ([id number]) -------------------------------------------------------------------------------- Activities of Daily Living Details Patient Name: JHAYDEN, DEMURO. Date of Service: 07/31/2016 8:45 AM Medical Record Number: [id number] Patient Account Number: [id number] Date of Birth/Sex: [birth year] (82 y.o. Male) Treating RN: Baruch Gouty, RN, BSN, Velva Harman Primary Care Physician: Unk Pinto Other Clinician: Referring Physician: Unk Pinto Treating Physician/Extender: Frann Rider in Treatment: 0 Activities of Daily Living Items Answer Activities of Daily Living (Please select one for each item) Drive Automobile Not Able Take Medications Completely Able Use Telephone Completely Able Care for Appearance Completely Able Use Toilet Completely Able Bath / Shower Completely Able Dress Self Completely Able Feed Self Completely Able Walk Completely Able Get In / Out Bed Completely Able Housework Completely Able Prepare  Meals Completely [place] for Self Need Assistance Electronic Signature(s) Signed: 07/31/2016 2:51:11 PM By: Regan Lemming BSN, RN Entered By: Regan Lemming on 07/31/2016 08:48:10 Johnny Bridge ([id number]) -------------------------------------------------------------------------------- Education Assessment Details Patient Name: Johnny Bridge Date of Service: 07/31/2016 8:45 AM Medical Record Number: [id number] Patient Account Number: [id number] Date of Birth/Sex: [birth year] (82 y.o. Male) Treating RN: Baruch Gouty, RN, BSN, Velva Harman Primary Care Physician: Unk Pinto Other Clinician: Referring Physician: Unk Pinto Treating Physician/Extender: Frann Rider in Treatment: 0 Primary Learner Assessed: Patient Learning Preferences/Education Level/Primary Language Highest Education Level: High School Preferred Language: English Cognitive Barrier Assessment/Beliefs Language Barrier: No Physical Barrier Assessment Impaired Vision: No Impaired Hearing: No Decreased Hand dexterity: No Knowledge/Comprehension Assessment Knowledge Level: Medium Comprehension Level: Medium Ability to understand written Medium instructions: Ability to understand verbal Medium instructions: Motivation Assessment Anxiety Level: Calm Cooperation: Cooperative Education Importance: Acknowledges Need Interest in Health Problems: Asks Questions Perception: Coherent Willingness to Engage in Self- Medium Management Activities: Readiness to Engage in Self- Medium Management Activities: Electronic Signature(s) Signed: 07/31/2016 2:51:11 PM By: Regan Lemming BSN, RN Entered By: Regan Lemming on 07/31/2016 08:50:21 MARCE, CHARLESWORTH ([id number]) -------------------------------------------------------------------------------- Fall Risk Assessment Details Patient Name: Johnny Bridge Date of Service: 07/31/2016 8:45 AM Medical Record Number: [id number] Patient Account  Number: [id number] Date of Birth/Sex: Sep 19, 1929 (82 y.o. Male) Treating RN: Baruch Gouty, RN, BSN, [place] Primary Care Physician: Unk Pinto Other Clinician: Referring Physician: Unk Pinto Treating Physician/Extender: Frann Rider in Treatment: 0 Fall Risk Assessment Items Have you had 2 or more falls in the last 12 monthso 0 No Have you had any fall that resulted in injury in the last 12 monthso 0 No FALL RISK ASSESSMENT: History of falling - immediate or within 3 months 0 No Secondary diagnosis 0 No Ambulatory aid None/bed rest/wheelchair/nurse 0 Yes Crutches/cane/walker 0 No Furniture 0 No IV Access/Saline Lock 0 No Gait/Training Normal/bed rest/immobile  0 Yes Weak 10 Yes Impaired 20 Yes Mental Status Oriented to own ability 0 Yes Electronic Signature(s) Signed: 07/31/2016 2:51:11 PM By: Regan Lemming BSN, RN Entered By: Regan Lemming on 07/31/2016 08:50:44 TRISTIAN, SICKINGER ([id number]) -------------------------------------------------------------------------------- Foot Assessment Details Patient Name: CORIN, TILLY. Date of Service: 07/31/2016 8:45 AM Medical Record Number: [id number] Patient Account Number: [id number] Date of Birth/Sex: [birth year] (82 y.o. Male) Treating RN: Baruch Gouty, RN, BSN, Velva Harman Primary Care Physician: Unk Pinto Other Clinician: Referring Physician: Unk Pinto Treating Physician/Extender: Frann Rider in Treatment: 0 Foot Assessment Items Site Locations + = Sensation present, - = Sensation absent, C = Callus, U = Ulcer R = Redness, W = Warmth, M = Maceration, PU = Pre-ulcerative lesion F = Fissure, S = Swelling, D = Dryness Assessment Right: Left: Other Deformity: No No Prior Foot Ulcer: No No Prior Amputation: No No Charcot Joint: No No Ambulatory Status: Ambulatory With Help Assistance Device: Wheelchair Gait: Administrator, arts) Signed: 07/31/2016 2:51:11 PM By: Regan Lemming BSN, RN Entered By:  Regan Lemming on 07/31/2016 08:51:00 Johnny Bridge ([id number]) -------------------------------------------------------------------------------- Nutrition Risk Assessment Details Patient Name: Johnny Bridge Date of Service: 07/31/2016 8:45 AM Medical Record Number: [id number] Patient Account Number: [id number] Date of Birth/Sex: 1929/02/27 (82 y.o. Male) Treating RN: Baruch Gouty, RN, BSN, Velva Harman Primary Care Physician: Unk Pinto Other Clinician: Referring Physician: Unk Pinto Treating Physician/Extender: Frann Rider in Treatment: 0 Height (in): Weight (lbs): Body Mass Index (BMI): Nutrition Risk Assessment Items NUTRITION RISK SCREEN: I have an illness or condition that made me change the kind and/or 0 No amount of food I eat I eat fewer than two meals per day 0 No I eat few fruits and vegetables, or milk products 0 No I have three or more drinks of beer, liquor or wine almost every day 0 No I have tooth or mouth problems that make it hard for me to eat 0 No I don't always have enough money to buy the food I need 0 No I eat alone most of the time 0 No I take three or more different prescribed or over-the-counter drugs a 0 No day Without wanting to, I have lost or gained 10 pounds in the last six 0 No months I am not always physically able to shop, cook and/or feed myself 0 No Nutrition Protocols Good Risk Protocol 0 No interventions needed Moderate Risk Protocol Electronic Signature(s) Signed: 07/31/2016 2:51:11 PM By: Regan Lemming BSN, RN Entered By: Regan Lemming on 07/31/2016 08:50:49

## 2016-08-01 NOTE — Progress Notes (Addendum)
KEITHON, MCCOIN ([id number]) Visit Report for 07/31/2016 Chief Complaint Document Details Patient Name: [name], [name]. Date of Service: 07/31/2016 8:45 AM Medical Record Number: [id number] Patient Account Number: [id number] Date of Birth/Sex: December 01, 1928 (82 y.o. Male) Treating RN: Cornell Barman Primary Care Physician: Unk Pinto Other Clinician: Referring Physician: Unk Pinto Treating Physician/Extender: Frann Rider in Treatment: 0 Information Obtained from: Patient Chief Complaint Patient presents to the wound care center for a consult due non healing wound the right lateral ankle and medial foot which she's had for about a month. Electronic Signature(s) Signed: 07/31/2016 9:39:56 AM By: Christin Fudge MD, FACS Entered By: Christin Fudge on 07/31/2016 09:39:56 JONNATHAN, BIRMAN ([id number]) -------------------------------------------------------------------------------- Debridement Details Patient Name: JANI, PLOEGER. Date of Service: 07/31/2016 8:45 AM Medical Record Number: [id number] Patient Account Number: [id number] Date of Birth/Sex: Sep 25, 1929 (82 y.o. Male) Treating RN: Cornell Barman Primary Care Physician: Unk Pinto Other Clinician: Referring Physician: Unk Pinto Treating Physician/Extender: Frann Rider in Treatment: 0 Debridement Performed for Wound #1 Right,Lateral Malleolus Assessment: Performed By: Physician Christin Fudge, MD Debridement: Debridement Pre-procedure Yes - 09:18 Verification/Time Out Taken: Start Time: 09:18 Pain Control: Lidocaine 4% Topical Solution Level: Skin/Subcutaneous Tissue Total Area Debrided (L x 2 (cm) x 2.5 (cm) = 5 (cm) W): Tissue and other Viable, Non-Viable, Fibrin/Slough, Subcutaneous material debrided: Instrument: Curette Bleeding: Minimum Hemostasis Achieved: Pressure End Time: 09:23 Procedural Pain: 0 Post Procedural Pain: 0 Response to Treatment: Procedure was tolerated well Post  Debridement Measurements of Total Wound Length: (cm) 3 Width: (cm) 2 Depth: (cm) 0.2 Volume: (cm) 0.942 Character of Wound/Ulcer Post Requires Further Debridement Debridement: Severity of Tissue Post Debridement: Fat layer exposed Post Procedure Diagnosis Same as Pre-procedure Electronic Signature(s) Signed: 07/31/2016 9:38:48 AM By: Christin Fudge MD, FACS Signed: 08/01/2016 10:50:50 AM By: Gretta Cool, RN, BSN, Kim RN, BSN Entered By: Christin Fudge on 07/31/2016 09:38:48 TADHG, ESKEW ([id number], NOSEWORTHY ([id number]) -------------------------------------------------------------------------------- Debridement Details Patient Name: NICANOR, MENDOLIA. Date of Service: 07/31/2016 8:45 AM Medical Record Number: [id number] Patient Account Number: [id number] Date of Birth/Sex: [birth year] (82 y.o. Male) Treating RN: Cornell Barman Primary Care Physician: Unk Pinto Other Clinician: Referring Physician: Unk Pinto Treating Physician/Extender: Frann Rider in Treatment: 0 Debridement Performed for Wound #2 Right,Medial Toe Great Assessment: Performed By: Physician Christin Fudge, MD Debridement: Debridement Pre-procedure Yes - 09:16 Verification/Time Out Taken: Start Time: 09:16 Pain Control: Lidocaine 4% Topical Solution Level: Skin/Subcutaneous Tissue Total Area Debrided (L x 0.5 (cm) x 0.5 (cm) = 0.25 (cm) W): Tissue and other Non-Viable, Fibrin/Slough, Subcutaneous material debrided: Instrument: Curette Bleeding: Minimum Hemostasis Achieved: Pressure End Time: 09:18 Procedural Pain: 0 Post Procedural Pain: 0 Response to Treatment: Procedure was tolerated well Post Debridement Measurements of Total Wound Length: (cm) 0.5 Stage: Category/Stage II Width: (cm) 0.5 Depth: (cm) 0.1 Volume: (cm) 0.02 Character of Wound/Ulcer Post Stable Debridement: Severity of Tissue Post Fat layer exposed Debridement: Post Procedure Diagnosis Same as  Pre-procedure Electronic Signature(s) Signed: 07/31/2016 9:38:59 AM By: Christin Fudge MD, FACS Signed: 08/01/2016 10:50:50 AM By: Gretta Cool, RN, BSN, Kim RN, BSN Entered By: Christin Fudge on 07/31/2016 09:38:58 VERYL, WINEMILLER ([id number], ELLWOOD ([id number]) -------------------------------------------------------------------------------- HPI Details Patient Name: DACOTAH, CABELLO. Date of Service: 07/31/2016 8:45 AM Medical Record Number: [id number] Patient Account Number: [id number] Date of Birth/Sex: 1929/01/08 (82 y.o. Male) Treating RN: Cornell Barman Primary Care Physician: Unk Pinto Other Clinician: Referring Physician: Unk Pinto Treating Physician/Extender: Frann Rider in Treatment: 0 History of Present Illness Location: right  lateral ankle and medial foot Quality: Patient reports experiencing a dull pain to affected area(s). Severity: Patient states wound are getting worse. Duration: Patient has had the wound for < 4 weeks prior to presenting for treatment Timing: Pain in wound is Intermittent (comes and goes Context: The wound appeared gradually over time Modifying Factors: Other treatment(s) tried include:as been put on doxycycline and is taking local care with hydrogen peroxide Associated Signs and Symptoms: Patient reports having increase swelling. 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 his past medical history is significant for anemia, arthritis, renal cancer with nephrectomy done in June 2013, lung cancer, supposed neck surgery, appendectomy, low back surgery, cholecystectomy, coronary angioplasty, laparoscopic left nephrectomy. PCP recently started him on doxycycline 100 mg. He is not a smoker. Electronic Signature(s) Signed: 07/31/2016 9:46:18 AM By: Christin Fudge MD,  FACS Entered By: Christin Fudge on 07/31/2016 09:46:18 REON, HUNLEY ([id number]) -------------------------------------------------------------------------------- Physical Exam Details Patient Name: RICHERD, GRIME Date of Service: 07/31/2016 8:45 AM Medical Record Number: [id number] Patient Account Number: [id number] Date of Birth/Sex: 1929-05-04 (82 y.o. Male) Treating RN: Cornell Barman Primary Care Physician: Unk Pinto Other Clinician: Referring Physician: Unk Pinto Treating Physician/Extender: Frann Rider in Treatment: 0 Constitutional . Pulse regular. Respirations normal and unlabored. Afebrile. . Eyes Nonicteric. Reactive to light. Ears, Nose, Mouth, and Throat Lips, teeth, and gums WNL.Marland Kitchen Moist mucosa without lesions. Neck supple and nontender. No palpable supraclavicular or cervical adenopathy. Normal sized without goiter. Respiratory WNL. No retractions.. Cardiovascular Pedal Pulses WNL. on the left was 1.03 and the right was 0.85. Stage 1 lymphedema both lower extremities. Chest Breasts symmetical and no nipple discharge.. Breast tissue WNL, no masses, lumps, or tenderness.. Gastrointestinal (GI) Abdomen without masses or tenderness.. No liver or spleen enlargement or tenderness.. Lymphatic No adneopathy. No adenopathy. No adenopathy. Musculoskeletal Adexa without tenderness or enlargement.. Digits and nails w/o clubbing, cyanosis, infection, petechiae, ischemia, or inflammatory conditions.. Integumentary (Hair, Skin) No suspicious lesions. No crepitus or fluctuance. No peri-wound warmth or erythema. No masses.Marland Kitchen Psychiatric Judgement and insight Intact.. No evidence of depression, anxiety, or agitation.. Notes has a prominent bunion on his right forefoot in the region of the first metatarsal head and has got significant callus and an ulcerated area which has slough and was sharply debrided with a #3 curet. in the right lateral malleolus again  he has a significant ulceration with slough and this was sharply debrided with a #3 curet and bleeding controlled with pressure Electronic Signature(s) Signed: 07/31/2016 9:48:45 AM By: Christin Fudge MD, FACS ARTIE, MCINTYRE ([id number]) Entered By: Christin Fudge on 07/31/2016 09:48:45 JAE, SKEET ([id number]) -------------------------------------------------------------------------------- Physician Orders Details Patient Name: DUSHAWN, PUSEY. Date of Service: 07/31/2016 8:45 AM Medical Record Number: [id number] Patient Account Number: [id number] Date of Birth/Sex: 10/02/1929 (82 y.o. Male) Treating RN: Baruch Gouty, RN, BSN, Velva Harman Primary Care Physician: Unk Pinto Other Clinician: Referring Physician: Unk Pinto Treating Physician/Extender: Frann Rider in Treatment: 0 Verbal / Phone Orders: Yes Clinician: Afful, RN, BSN, Rita Read Back and Verified: Yes Diagnosis Coding Wound Cleansing Wound #1 Right,Lateral Malleolus o Cleanse wound with mild soap and water o May Shower, gently pat wound dry prior to applying new dressing. Wound #2 Right,Medial Toe Great o Cleanse wound with mild soap and water o May Shower, gently pat wound dry prior to applying new dressing. Anesthetic Wound #1 Right,Lateral Malleolus o Topical Lidocaine  4% cream applied to wound bed prior to debridement Wound #2 Right,Medial Toe Great o Topical Lidocaine 4% cream applied to wound bed prior to debridement Skin Barriers/Peri-Wound Care Wound #1 Right,Lateral Malleolus o Barrier cream Wound #2 Right,Medial Toe Great o Barrier cream Primary Wound Dressing Wound #1 Right,Lateral Malleolus o Santyl Ointment Wound #2 Right,Medial Toe Great o Sorbalgon Ag Secondary Dressing Wound #1 Right,Lateral Malleolus o Dry Gauze o Boardered Foam Dressing Wound #2 [address] ([id number]) o Dry Gauze o Boardered Foam Dressing Dressing  Change Frequency Wound #1 Right,Lateral Malleolus o Change dressing every day. Wound #2 Right,Medial Toe Great o Change dressing every other day. Follow-up Appointments Wound #1 Right,Lateral Malleolus o Return Appointment in 1 week. Wound #2 Right,Medial Toe Great o Return Appointment in 1 week. Off-Loading Wound #1 Right,Lateral Malleolus o Open toe surgical shoe with peg assist. Wound #2 Right,Medial Toe Great o Open toe surgical shoe with peg assist. Additional Orders / Instructions Wound #1 Right,Lateral Malleolus o Increase protein intake. o Activity as tolerated o Other: - MVI, Vit A, C, Zinc Wound #2 Right,Medial Toe Great o Increase protein intake. o Activity as tolerated o Other: - MVI, Vit A, C, Zinc Consults o Podiatry - Daugther will make appointment Radiology o X-ray, foot - right ankle, right medial boyoin Services and Therapies o Arterial Studies- Bilateral o Venous Studies -Bilateral QUINTAVIS, BRANDS ([id number]) Patient Medications Allergies: No known allergies Notifications Medication Indication Start End Santyl 07/31/2016 DOSE topical 250 unit/gram ointment - ointment topical as directed Electronic Signature(s) Signed: 08/08/2016 4:15:34 PM By: Christin Fudge MD, FACS Signed: 11/07/2016 5:33:36 PM By: Regan Lemming BSN, RN Previous Signature: 07/31/2016 10:54:50 AM Version By: Christin Fudge MD, FACS Entered By: Regan Lemming on 08/07/2016 08:26:36 DELPHIN, FUNES ([id number]) -------------------------------------------------------------------------------- Problem List Details Patient Name: ALVEY, BROCKEL. Date of Service: 07/31/2016 8:45 AM Medical Record Number: [id number] Patient Account Number: [id number] Date of Birth/Sex: 04-29-1929 (82 y.o. Male) Treating RN: Cornell Barman Primary Care Physician: Unk Pinto Other Clinician: Referring Physician: Unk Pinto Treating Physician/Extender: Frann Rider  in Treatment: 0 Active Problems ICD-10 Encounter Code Description Active Date Diagnosis [phone] Non-pressure chronic ulcer of right ankle with fat layer 07/31/2016 Yes exposed L97.512 Non-pressure chronic ulcer of other part of right foot with 07/31/2016 Yes fat layer exposed I73.9 Peripheral vascular disease, unspecified 07/31/2016 Yes M21.611 Bunion of right foot 07/31/2016 Yes I89.0 Lymphedema, not elsewhere classified 07/31/2016 Yes E66.9 Obesity, unspecified 07/31/2016 Yes Inactive Problems Resolved Problems Electronic Signature(s) Signed: 07/31/2016 9:47:19 AM By: Christin Fudge MD, FACS Previous Signature: 07/31/2016 9:47:03 AM Version By: Christin Fudge MD, FACS Previous Signature: 07/31/2016 9:39:24 AM Version By: Christin Fudge MD, FACS Previous Signature: 07/31/2016 9:38:28 AM Version By: Christin Fudge MD, FACS Previous Signature: 07/31/2016 9:28:51 AM Version By: Christin Fudge MD, FACS Entered By: Christin Fudge on 07/31/2016 09:47:19 IYAN, FLETT ([id number]) -------------------------------------------------------------------------------- Progress Note Details Patient Name: HAWK, MONES. Date of Service: 07/31/2016 8:45 AM Medical Record Number: [id number] Patient Account Number: [id number] Date of Birth/Sex: July 17, 1929 (82 y.o. Male) Treating RN: Cornell Barman Primary Care Physician: Unk Pinto Other Clinician: Referring Physician: Unk Pinto Treating Physician/Extender: Frann Rider in Treatment: 0 Subjective Chief Complaint Information obtained from Patient Patient presents to the wound care center for a consult due non healing wound the right lateral ankle and medial foot which she's had for about a month. History of Present Illness (HPI) The following HPI elements were documented for the patient's wound: Location: right  lateral ankle and medial foot Quality: Patient reports experiencing a dull pain to affected area(s). Severity: Patient states  wound are getting worse. Duration: Patient has had the wound for < 4 weeks prior to presenting for treatment Timing: Pain in wound is Intermittent (comes and goes Context: The wound appeared gradually over time Modifying Factors: Other treatment(s) tried include:as been put on doxycycline and is taking local care with hydrogen peroxide Associated Signs and Symptoms: Patient reports having increase swelling. 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 his past medical history is significant for anemia, arthritis, renal cancer with nephrectomy done in June 2013, lung cancer, supposed neck surgery, appendectomy, low back surgery, cholecystectomy, coronary angioplasty, laparoscopic left nephrectomy. PCP recently started him on doxycycline 100 mg. He is not a smoker. Wound History Patient presents with 1 open wound that has been present for approximately 48month Patient has been treating wound in the following manner: betadine. The wound has been healed in the past but has re- opened. Laboratory tests have not been performed in the last month. Patient reportedly has not tested positive for an antibiotic resistant organism. Patient reportedly has not tested positive for osteomyelitis. Patient reportedly has not had testing performed to evaluate circulation in the legs. Patient experiences the following problems associated with their wounds: infection, swelling. Patient History Information obtained from Patient, Caregiver. Allergies No known allergies CWILLIAM, SCHAKE([id number] Social History Former smoker - quit 40 years ago, Marital Status - Widowed, Alcohol Use - Rarely, Drug Use - No History, Caffeine Use - Moderate. Medical History Eyes Patient has history of Cataracts Hematologic/Lymphatic Denies history of Anemia, Hemophilia, Human  Immunodeficiency Virus, Lymphedema, Sickle Cell Disease Respiratory Denies history of Aspiration, Asthma, Chronic Obstructive Pulmonary Disease (COPD), Pneumothorax, Sleep Apnea, Tuberculosis Cardiovascular Patient has history of Arrhythmia, Coronary Artery Disease, Hypertension Gastrointestinal Denies history of Cirrhosis , Colitis, Crohn s, Hepatitis A, Hepatitis B, Hepatitis C Endocrine Denies history of Type I Diabetes, Type II Diabetes Immunological Denies history of Lupus Erythematosus, Raynaud s, Scleroderma Musculoskeletal Patient has history of Gout, Osteoarthritis Neurologic Denies history of Dementia, Neuropathy, Quadriplegia, Seizure Disorder Oncologic Denies history of Received Chemotherapy, Received Radiation Psychiatric Denies history of Anorexia/bulimia, Confinement Anxiety Medical And Surgical History Notes Ear/Nose/Mouth/Throat Hearing aids Cardiovascular High Cholesterol Genitourinary only one kidney...right Review of Systems (ROS) Constitutional Symptoms (General Health) The patient has no complaints or symptoms. Eyes Complains or has symptoms of Glasses / Contacts. Ear/Nose/Mouth/Throat The patient has no complaints or symptoms. Hematologic/Lymphatic The patient has no complaints or symptoms. Respiratory The patient has no complaints or symptoms. Cardiovascular CDVONTAE, RUAN([id number] Complains or has symptoms of LE edema. Gastrointestinal The patient has no complaints or symptoms. Endocrine The patient has no complaints or symptoms. Immunological The patient has no complaints or symptoms. Integumentary (Skin) Complains or has symptoms of Wounds, Breakdown, Swelling. Musculoskeletal The patient has no complaints or symptoms. Neurologic Complains or has symptoms of Numbness/parasthesias - right first 3 fingers. Oncologic The patient has no complaints or symptoms. Psychiatric The patient has no complaints or  symptoms. Medications: bumetanide 18 1 mg daily, allopurinol 150 mg daily, losartan potassium 50 mg per day, pravastatin 40 mg per day, amlodipine 5 mg per day, levothyroxine 200 g per day, vitamin D3 2000 iu daily, tramadol Objective Constitutional Pulse regular. Respirations normal and unlabored. Afebrile. Vitals Time Taken: 8:51 AM, Height: 69 in, Source: Stated, Weight: 259 lbs, Source:  Stated, BMI: 38.2, Temperature: 97.6 F, Pulse: 76 bpm, Respiratory Rate: 19 breaths/min, Blood Pressure: 156/60 mmHg. Eyes Nonicteric. Reactive to light. Ears, Nose, Mouth, and Throat Lips, teeth, and gums WNL.Marland Kitchen Moist mucosa without lesions. Neck supple and nontender. No palpable supraclavicular or cervical adenopathy. Normal sized without goiter. KIRTAN, SADA ([id number]) Respiratory WNL. No retractions.. Cardiovascular Pedal Pulses WNL. on the left was 1.03 and the right was 0.85. Stage 1 lymphedema both lower extremities. Chest Breasts symmetical and no nipple discharge.. Breast tissue WNL, no masses, lumps, or tenderness.. Gastrointestinal (GI) Abdomen without masses or tenderness.. No liver or spleen enlargement or tenderness.. Lymphatic No adneopathy. No adenopathy. No adenopathy. Musculoskeletal Adexa without tenderness or enlargement.. Digits and nails w/o clubbing, cyanosis, infection, petechiae, ischemia, or inflammatory conditions.Marland Kitchen Psychiatric Judgement and insight Intact.. No evidence of depression, anxiety, or agitation.. General Notes: has a prominent bunion on his right forefoot in the region of the first metatarsal head and has got significant callus and an ulcerated area which has slough and was sharply debrided with a #3 curet. in the right lateral malleolus again he has a significant ulceration with slough and this was sharply debrided with a #3 curet and bleeding controlled with pressure Integumentary (Hair, Skin) No suspicious lesions. No crepitus or fluctuance.  No peri-wound warmth or erythema. No masses.. Wound #1 status is Open. Original cause of wound was Gradually Appeared. The wound is located on the Right,Lateral Malleolus. The wound measures 2cm length x 2.5cm width x 0.2cm depth; 3.927cm^2 area and 0.785cm^3 volume. The wound is limited to skin breakdown. There is no tunneling or undermining noted. There is a medium amount of serosanguineous drainage noted. The wound margin is indistinct and nonvisible. There is small (1-33%) pink, pale granulation within the wound bed. There is a large (67-100%) amount of necrotic tissue within the wound bed including Adherent Slough. The periwound skin appearance exhibited: Localized Edema, Maceration, Moist. The periwound skin appearance did not exhibit: Callus, Crepitus, Excoriation, Fluctuance, Friable, Induration, Rash, Scarring, Dry/Scaly, Atrophie Blanche, Cyanosis, Ecchymosis, Hemosiderin Staining, Mottled, Pallor, Rubor, Erythema. Periwound temperature was noted as No Abnormality. The periwound has tenderness on palpation. Wound #2 status is Open. Original cause of wound was Gradually Appeared. The wound is located on the [employer]. The wound measures 0.5cm length x 0.5cm width x 0.1cm depth; 0.196cm^2 area and 0.02cm^3 volume. The wound is limited to skin breakdown. There is no tunneling or undermining noted. There is a medium amount of serosanguineous drainage noted. The wound margin is distinct with the outline attached to the wound base. There is medium (34-66%) pink, pale granulation within the wound bed. There is a small (1-33%) amount of necrotic tissue within the wound bed including Eschar and Adherent Slough. The periwound skin appearance exhibited: Moist. The periwound skin appearance did not exhibit: Callus, Crepitus, Excoriation, Fluctuance, Friable, Induration, Localized Edema, Rash, Scarring, Dry/Scaly, NETANEL, YANNUZZI. ([id number]) Maceration, Atrophie Blanche, Cyanosis,  Ecchymosis, Hemosiderin Staining, Mottled, Pallor, Rubor, Erythema. Periwound temperature was noted as No Abnormality. The periwound has tenderness on palpation. Assessment Active Problems ICD-10 L97.312 - Non-pressure chronic ulcer of right ankle with fat layer exposed L97.512 - Non-pressure chronic ulcer of other part of right foot with fat layer exposed I73.9 - Peripheral vascular disease, unspecified M21.611 - Bunion of right foot I89.0 - Lymphedema, not elsewhere classified E66.9 - Obesity, unspecified 81 year old gentleman with metastatic renal cell carcinoma and morbid obesity has not had definite diagnosis of diabetes mellitus. He has significant issues with his  right ankle and forefoot, after review I have recommended: 1. Santyl ointment locally to the right lateral malleolus to be changed daily. 2. Aquacel Ag to the left forefoot and a protective foam bandage and offloading of this area as much as possible 3. Arterial and venous duplex studies of both lower extremities 4. x-ray of the right ankle and left forefoot 5. Podiatary consult for opinion regarding his bunion Procedures Wound #1 Wound #1 is a Venous Leg Ulcer located on the Right,Lateral Malleolus . There was a Skin/Subcutaneous Tissue Debridement ([id number]) debridement with total area of 5 sq cm performed by Christin Fudge, MD. with the following instrument(s): Curette to remove Viable and Non-Viable tissue/material including Fibrin/Slough and Subcutaneous after achieving pain control using Lidocaine 4% Topical Solution. A time out was conducted at 09:18, prior to the start of the procedure. A Minimum amount of bleeding was controlled with Pressure. The procedure was tolerated well with a pain level of 0 throughout and a pain level of 0 following the procedure. Post Debridement Measurements: 3cm length x 2cm width x 0.2cm depth; 0.942cm^3 volume. Character of Wound/Ulcer Post Debridement requires further  debridement. Severity of Tissue Post Debridement is: Fat layer exposed. Post procedure Diagnosis Wound #1: Same as Pre-Procedure [facility], [facility] ([id number]) Wound #2 Wound #2 is a Pressure Ulcer located on the Right,Medial Toe Great . There was a Skin/Subcutaneous Tissue Debridement ([id number]) debridement with total area of 0.25 sq cm performed by Christin Fudge, MD. with the following instrument(s): Curette to remove Non-Viable tissue/material including Fibrin/Slough and Subcutaneous after achieving pain control using Lidocaine 4% Topical Solution. A time out was conducted at 09:16, prior to the start of the procedure. A Minimum amount of bleeding was controlled with Pressure. The procedure was tolerated well with a pain level of 0 throughout and a pain level of 0 following the procedure. Post Debridement Measurements: 0.5cm length x 0.5cm width x 0.1cm depth; 0.02cm^3 volume. Post debridement Stage noted as Category/Stage II. Character of Wound/Ulcer Post Debridement is stable. Severity of Tissue Post Debridement is: Fat layer exposed. Post procedure Diagnosis Wound #2: Same as Pre-Procedure Plan Wound Cleansing: Wound #1 Right,Lateral Malleolus: Cleanse wound with mild soap and water May Shower, gently pat wound dry prior to applying new dressing. Wound #2 Right,Medial Toe Great: Cleanse wound with mild soap and water May Shower, gently pat wound dry prior to applying new dressing. Anesthetic: Wound #1 Right,Lateral Malleolus: Topical Lidocaine 4% cream applied to wound bed prior to debridement Wound #2 Right,Medial Toe Great: Topical Lidocaine 4% cream applied to wound bed prior to debridement Skin Barriers/Peri-Wound Care: Wound #1 Right,Lateral Malleolus: Barrier cream Wound #2 Right,Medial Toe Great: Barrier cream Primary Wound Dressing: Wound #1 Right,Lateral Malleolus: Santyl Ointment Wound #2 Right,Medial Toe Great: Sorbalgon Ag Secondary Dressing: Wound #1  Right,Lateral Malleolus: Dry Gauze Boardered Foam Dressing Wound #2 Right,Medial Toe Great: Dry Gauze ABEL, HAGEMAN ([id number]) Boardered Foam Dressing Dressing Change Frequency: Wound #1 Right,Lateral Malleolus: Change dressing every day. Wound #2 Right,Medial Toe Great: Change dressing every other day. Follow-up Appointments: Wound #1 Right,Lateral Malleolus: Return Appointment in 1 week. Wound #2 Right,Medial Toe Great: Return Appointment in 1 week. Off-Loading: Wound #1 Right,Lateral Malleolus: Open toe surgical shoe with peg assist. Wound #2 Right,Medial Toe Great: Open toe surgical shoe with peg assist. Additional Orders / Instructions: Wound #1 Right,Lateral Malleolus: Increase protein intake. Activity as tolerated Other: - MVI, Vit A, C, Zinc Wound #2 Right,Medial Toe Great: Increase protein intake. Activity as tolerated Other: -  MVI, Vit A, C, Zinc Radiology ordered were: X-ray, foot - right ankle, right medial boyoin Consults ordered were: Podiatry - Daugther will make appointment Services and Therapies ordered were: Arterial Studies- Bilateral, Venous Studies -Bilateral The following medication(s) was prescribed: Santyl topical 250 unit/gram ointment ointment topical as directed starting 07/31/2016 82 year old gentleman with metastatic renal cell carcinoma and morbid obesity has not had definite diagnosis of diabetes mellitus. He has significant issues with his right ankle and forefoot, after review I have recommended: 1. Santyl ointment locally to the right lateral malleolus to be changed daily. 2. Aquacel Ag to the left forefoot and a protective foam bandage and offloading of this area as much as possible 3. Arterial and venous duplex studies of both lower extremities 4. x-ray of the right ankle and left forefoot KELVON, GIANNINI. ([id number]) 5. Podiatary consult for opinion regarding his bunion 6. Adequate nutrition with protein and vitamin A, vitamin  C and zinc Electronic Signature(s) Signed: 08/07/2016 4:26:38 PM By: Christin Fudge MD, FACS Previous Signature: 07/31/2016 10:55:24 AM Version By: Christin Fudge MD, FACS Previous Signature: 07/31/2016 9:55:10 AM Version By: Christin Fudge MD, FACS Entered By: Christin Fudge on 08/07/2016 16:26:38 REMUS, HAGEDORN ([id number]) -------------------------------------------------------------------------------- ROS/PFSH Details Patient Name: DELMAR, ARRIAGA. Date of Service: 07/31/2016 8:45 AM Medical Record Number: [id number] Patient Account Number: [id number] Date of Birth/Sex: [birth year] (82 y.o. Male) Treating RN: Baruch Gouty, RN, BSN, [place] Primary Care Physician: Unk Pinto Other Clinician: Referring Physician: Unk Pinto Treating Physician/Extender: Frann Rider in Treatment: 0 Information Obtained From Patient Caregiver Wound History Do you currently have one or more open woundso Yes How many open wounds do you currently haveo 1 Approximately how long have you had your woundso 3monthHow have you been treating your wound(s) until nowo betadine Has your wound(s) ever healed and then re-openedo Yes Have you had any lab work done in the past montho No Have you tested positive for an antibiotic resistant organism (MRSA, VRE)o No Have you tested positive for osteomyelitis (bone infection)o No Have you had any tests for circulation on your legso No Have you had other problems associated with your woundso Infection, Swelling Eyes Complaints and Symptoms: Positive for: Glasses / Contacts Medical History: Positive for: Cataracts Cardiovascular Complaints and Symptoms: Positive for: LE edema Medical History: Positive for: Arrhythmia; Coronary Artery Disease; Hypertension Past Medical History Notes: High Cholesterol Integumentary (Skin) Complaints and Symptoms: Positive for: Wounds; Breakdown; Swelling Neurologic Complaints and Symptoms: Positive for:  Numbness/parasthesias - right first 3 fingers CKEMANI, DEMARAIS([id number] Medical History: Negative for: Dementia; Neuropathy; Quadriplegia; Seizure Disorder Constitutional Symptoms (General Health) Complaints and Symptoms: No Complaints or Symptoms Ear/Nose/Mouth/Throat Complaints and Symptoms: No Complaints or Symptoms Medical History: Past Medical History Notes: Hearing aids Hematologic/Lymphatic Complaints and Symptoms: No Complaints or Symptoms Medical History: Negative for: Anemia; Hemophilia; Human Immunodeficiency Virus; Lymphedema; Sickle Cell Disease Respiratory Complaints and Symptoms: No Complaints or Symptoms Medical History: Negative for: Aspiration; Asthma; Chronic Obstructive Pulmonary Disease (COPD); Pneumothorax; Sleep Apnea; Tuberculosis Gastrointestinal Complaints and Symptoms: No Complaints or Symptoms Medical History: Negative for: Cirrhosis ; Colitis; Crohnos; Hepatitis A; Hepatitis B; Hepatitis C Endocrine Complaints and Symptoms: No Complaints or Symptoms Medical History: Negative for: Type I Diabetes; Type II Diabetes Genitourinary CBROOKE, PAYES([id number] Medical History: Past Medical History Notes: only one kidney...right Immunological Complaints and Symptoms: No Complaints or Symptoms Medical History: Negative for: Lupus Erythematosus; Raynaudos; Scleroderma Musculoskeletal Complaints and Symptoms: No Complaints or Symptoms Medical History: Positive for: Gout; Osteoarthritis Oncologic Complaints  and Symptoms: No Complaints or Symptoms Medical History: Negative for: Received Chemotherapy; Received Radiation Psychiatric Complaints and Symptoms: No Complaints or Symptoms Medical History: Negative for: Anorexia/bulimia; Confinement Anxiety HBO Extended History Items Eyes: Cataracts Family and Social History Former smoker - quit 40 years ago; Marital Status - Widowed; Alcohol Use: Rarely; Drug Use: No History; Caffeine  Use: Moderate; Financial Concerns: No; Food, Clothing or Shelter Needs: No; Support System Lacking: No; Transportation Concerns: No; Advanced Directives: Yes (Not Provided); Patient does not want information on Advanced Directives; Living Will: Yes (Not Provided); Medical Power of Attorney: Yes - Lawrence Santiago (Not Provided) Physician Affirmation I have reviewed and agree with the above information. Electronic Signature(s) TEQUAN, REDMON ([id number]) Signed: 07/31/2016 11:43:27 AM By: Christin Fudge MD, FACS Signed: 07/31/2016 2:51:11 PM By: Regan Lemming BSN, RN Entered By: Christin Fudge on 07/31/2016 09:13:52 SEUNG, NIDIFFER ([id number]) -------------------------------------------------------------------------------- SuperBill Details Patient Name: ARNIE, CLINGENPEEL. Date of Service: 07/31/2016 Medical Record Number: [id number] Patient Account Number: [id number] Date of Birth/Sex: 10/20/1929 (82 y.o. Male) Treating RN: Cornell Barman Primary Care Physician: Unk Pinto Other Clinician: Referring Physician: Unk Pinto Treating Physician/Extender: Frann Rider in Treatment: 0 Diagnosis Coding ICD-10 Codes Code Description [phone] Non-pressure chronic ulcer of right ankle with fat layer exposed L97.512 Non-pressure chronic ulcer of other part of right foot with fat layer exposed I73.9 Peripheral vascular disease, unspecified M21.611 Bunion of right foot I89.0 Lymphedema, not elsewhere classified E66.9 Obesity, unspecified Facility Procedures CPT4 Code Description: [id number] 99213 - WOUND CARE VISIT-LEV 3 EST PT Modifier: Quantity: 1 CPT4 Code Description: 14103013 11042 - DEB SUBQ TISSUE 20 SQ CM/< ICD-10 Description Diagnosis L97.312 Non-pressure chronic ulcer of right ankle with fat la L97.512 Non-pressure chronic ulcer of other part of right foo I73.9 Peripheral vascular disease,  unspecified M21.611 Bunion of right foot Modifier: yer exposed t with fat la Quantity: 1  yer exposed Physician Procedures CPT4 Code Description: 1438887 57972 - WC PHYS LEVEL 4 - NEW PT ICD-10 Description Diagnosis L97.312 Non-pressure chronic ulcer of right ankle with fat l I73.9 Peripheral vascular disease, unspecified L97.512 Non-pressure chronic ulcer of other part of  right fo I89.0 Lymphedema, not elsewhere classified Modifier: 25 ayer exposed ot with fat lay Quantity: 1 er exposed CPT4 Code Description: 8206015 11042 - WC PHYS SUBQ TISS 20 SQ CM ICD-10 Description Diagnosis GEORGIO, HATTABAUGH ([id number]) Modifier: Quantity: 1 Electronic Signature(s) Signed: 07/31/2016 9:55:42 AM By: Christin Fudge MD, FACS Entered By: Christin Fudge on 07/31/2016 09:55:41

## 2016-08-04 ENCOUNTER — Ambulatory Visit (INDEPENDENT_AMBULATORY_CARE_PROVIDER_SITE_OTHER): Payer: Medicare Other | Admitting: Internal Medicine

## 2016-08-04 ENCOUNTER — Encounter: Payer: Self-pay | Admitting: Internal Medicine

## 2016-08-04 ENCOUNTER — Other Ambulatory Visit: Payer: Self-pay | Admitting: *Deleted

## 2016-08-04 VITALS — BP 136/60 | HR 72 | Temp 97.3°F | Resp 16

## 2016-08-04 DIAGNOSIS — B351 Tinea unguium: Secondary | ICD-10-CM | POA: Diagnosis not present

## 2016-08-04 DIAGNOSIS — Z79899 Other long term (current) drug therapy: Secondary | ICD-10-CM

## 2016-08-04 DIAGNOSIS — R609 Edema, unspecified: Secondary | ICD-10-CM

## 2016-08-04 DIAGNOSIS — M79676 Pain in unspecified toe(s): Secondary | ICD-10-CM | POA: Diagnosis not present

## 2016-08-04 DIAGNOSIS — I10 Essential (primary) hypertension: Secondary | ICD-10-CM

## 2016-08-04 DIAGNOSIS — G894 Chronic pain syndrome: Secondary | ICD-10-CM

## 2016-08-04 LAB — BASIC METABOLIC PANEL WITH GFR
BUN: 56 mg/dL — ABNORMAL HIGH (ref 7–25)
CHLORIDE: 104 mmol/L (ref 98–110)
CO2: 24 mmol/L (ref 20–31)
Calcium: 9 mg/dL (ref 8.6–10.3)
Creat: 2.68 mg/dL — ABNORMAL HIGH (ref 0.70–1.11)
GFR, EST NON AFRICAN AMERICAN: 21 mL/min — AB (ref >=60)
GFR, Est African American: 24 mL/min — ABNORMAL LOW (ref >=60)
Glucose, Bld: 111 mg/dL — ABNORMAL HIGH (ref 65–99)
POTASSIUM: 4.9 mmol/L (ref 3.5–5.3)
SODIUM: 136 mmol/L (ref 135–146)

## 2016-08-04 MED ORDER — TRAMADOL HCL 50 MG PO TABS: ORAL_TABLET | ORAL | 5 refills | Status: DC

## 2016-08-04 NOTE — Progress Notes (Signed)
Subjective:    Patient ID: [name], male    DOB: 1929/07/29, 81 y.o.   MRN: [id number]  HPI  This very nice 82 yo WWM with multiple co-morbidities was seen 210 days ago for worsening edema and advised to increase his Bumex and is brought in again by his daughter who also notes his dependent edema is improved . He,also, was felt to have a stasis dermatitis compromised by a cellulitis for which he was rx'd Doxycycline and he has since been established at the wound center for debridement of a stasis type pressure ulcer of his R heel and a smaller lesion of a bunion of the R 1st MT area.   Medication Sig  . allopurinol (ZYLOPRIM) 300 MG tablet TAKE 1 TABLET DAILY  . amLODipine (NORVASC) 5 MG tablet Take 1 tablet (5 mg total) by mouth daily before breakfast.  . aspirin EC 81 MG tablet Take 81 mg by mouth daily.  . bumetanide (BUMEX) 2 MG tablet TAKE ONE-HALF (1/2) TO ONE TABLET DAILY FOR FLUID  . Cholecalciferol (VITAMIN D3) 1000 UNITS CAPS Take 2,000 capsules by mouth daily.   . diclofenac sodium (VOLTAREN) 1 % GEL Apply 4 g topically 4 (four) times daily.  Marland Kitchen doxycycline (VIBRAMYCIN) 100 MG capsule Take 1 capsule daily with food for infection  . FERROUS SULFATE PO Take 1 tablet by mouth 2 (two) times daily.   Marland Kitchen levothyroxine (SYNTHROID, LEVOTHROID) 150 MCG tablet Take 1 pill daily but take 1.5 pills on Sunday, take 1 hour before food, only with water.  Marland Kitchen losartan (COZAAR) 50 MG tablet Take 1 tablet (50 mg total) by mouth daily.  . Magnesium 250 MG TABS Take 500 mg by mouth daily.   . Multiple Vitamin (MULITIVITAMIN WITH MINERALS) TABS Take 1 tablet by mouth daily.  . nitroGLYCERIN (NITROSTAT) 0.4 MG SL tablet Place 0.4 mg under the tongue every 5 (five) minutes as needed for chest pain.   . pravastatin (PRAVACHOL) 40 MG tablet Take 1 tablet (40 mg total) by mouth at bedtime.  . vitamin C (ASCORBIC ACID) 500 MG tablet Take 500 mg by mouth 2 (two) times daily.   No Known Allergies Past  Medical History:  Diagnosis Date  . Anemia   . Arthritis    stenosis - back, neck  . Cancer [facility])    renal neoplasm  . Chronic kidney disease    renalCa- nephrectomy [facility]) 04/2012  . Coronary artery disease   . GERD (gastroesophageal reflux disease)   . Hard of hearing   . Hearing deficit    hearing aids- bilateral  . Hypertension    sees Dr. Wynonia Lawman, stress test recently for prep for surgery  . Hypothyroidism   . 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 Prediabetes   . Vitamin D deficiency    Past Surgical History:  Procedure Laterality Date  . ANTERIOR CERVICAL DECOMP/DISCECTOMY FUSION N/A 01/18/2013   Procedure: ANTERIOR CERVICAL DECOMPRESSION/DISCECTOMY FUSION 2 LEVELS;  Surgeon: Faythe Ghee, MD;  Location: [place] [facility];  Service: Neurosurgery;  Laterality: N/A;  . APPENDECTOMY    . BACK SURGERY     lower back surgery - 1990's  . CHOLECYSTECTOMY    . CORONARY ANGIOPLASTY     stents , + 7-30yr. ago  . EYE SURGERY     right eye cataract surgery   . JOINT REPLACEMENT     bilateral knee replacements   .  LAPAROSCOPIC NEPHRECTOMY  04/29/2012   Procedure: LAPAROSCOPIC NEPHRECTOMY;  Surgeon: Dutch Gray, MD;  Location: [facility];  Service: Urology;  Laterality: Left;      . TONSILLECTOMY     Review of Systems  10 point systems review negative except as above.    Objective:   Physical Exam  BP 136/60   Pulse 72   Temp 97.3 F (36.3 C)   Resp 16    Morbidly Obese elderly WM in a W/C  HEENT -WNL. Neck - supple. Nl Thyroid. Carotids 2+ & No bruits, nodes, JVD Chest - Clear equal BS w/o Rales.. Cor - Nl HS. RRR w/o sig M. PP obscured by 2 (+) edema. Skin-  Chronic stasis pigmentation of both LE w/o evidence of cellulitis. L lat malleolus ulcer is bandaged. Bilat thickened dystrophic toenails. MS- FROM w/o deformities. Muscle power, tone and bulk decreased consistant with age and deconditioning. In W/C.   Neuro - No obvious Cr N abnormalities. Sensory, motor and Cerebellar functions appear Nl w/o focal abnormalities. Psyche - Mental status normal & appropriate.  No delusions, ideations or obvious mood abnormalities.    Assessment & Plan:   1. Essential hypertension   2. Edema, Venostasis type  3. Onychomycosis, toenails   - Podiatry referral  4. Medication management  - BASIC METABOLIC PANEL WITH GFR  - Discussed meds & SE's. Over 20 minutes of exam, counseling, chart review and critical decision making was performed.

## 2016-08-04 NOTE — Patient Instructions (Signed)
Edema °Edema is an abnormal buildup of fluids in your body tissues. Edema is somewhat dependent on gravity to pull the fluid to the lowest place in your body. That makes the condition more common in the legs and thighs (lower extremities). Painless swelling of the feet and ankles is common and becomes more likely as you get older. It is also common in looser tissues, like around your eyes.  °When the affected area is squeezed, the fluid may move out of that spot and leave a dent for a few moments. This dent is called pitting.  °CAUSES  °There are many possible causes of edema. Eating too much salt and being on your feet or sitting for a long time can cause edema in your legs and ankles. Hot weather may make edema worse. Common medical causes of edema include: °· Heart failure. °· Liver disease. °· Kidney disease. °· Weak blood vessels in your legs. °· Cancer. °· An injury. °· Pregnancy. °· Some medications. °· Obesity.  °SYMPTOMS  °Edema is usually painless. Your skin may look swollen or shiny.  °DIAGNOSIS  °Your health care provider may be able to diagnose edema by asking about your medical history and doing a physical exam. You may need to have tests such as X-rays, an electrocardiogram, or blood tests to check for medical conditions that may cause edema.  °TREATMENT  °Edema treatment depends on the cause. If you have heart, liver, or kidney disease, you need the treatment appropriate for these conditions. General treatment may include: °· Elevation of the affected body part above the level of your heart. °· Compression of the affected body part. Pressure from elastic bandages or support stockings squeezes the tissues and forces fluid back into the blood vessels. This keeps fluid from entering the tissues. °· Restriction of fluid and salt intake. °· Use of a water pill (diuretic). These medications are appropriate only for some types of edema. They pull fluid out of your body and make you urinate more often. This  gets rid of fluid and reduces swelling, but diuretics can have side effects. Only use diuretics as directed by your health care provider. °HOME CARE INSTRUCTIONS  °· Keep the affected body part above the level of your heart when you are lying down.   °· Do not sit still or stand for prolonged periods.   °· Do not put anything directly under your knees when lying down. °· Do not wear constricting clothing or garters on your upper legs.   °· Exercise your legs to work the fluid back into your blood vessels. This may help the swelling go down.   °· Wear elastic bandages or support stockings to reduce ankle swelling as directed by your health care provider.   °· Eat a low-salt diet to reduce fluid if your health care provider recommends it.   °· Only take medicines as directed by your health care provider.  °SEEK MEDICAL CARE IF:  °· Your edema is not responding to treatment. °· You have heart, liver, or kidney disease and notice symptoms of edema. °· You have edema in your legs that does not improve after elevating them.   °· You have sudden and unexplained weight gain. °SEEK IMMEDIATE MEDICAL CARE IF:  °· You develop shortness of breath or chest pain.   °· You cannot breathe when you lie down. °· You develop pain, redness, or warmth in the swollen areas.   °· You have heart, liver, or kidney disease and suddenly get edema. °· You have a fever and your symptoms suddenly get worse. °MAKE SURE YOU:  °·   Understand these instructions. °· Will watch your condition. °· Will get help right away if you are not doing well or get worse. °  °This information is not intended to replace advice given to you by your health care provider. Make sure you discuss any questions you have with your health care provider. °  °Document Released: 10/27/2005 Document Revised: 11/17/2014 Document Reviewed: 08/19/2013 °[employer] Interactive Patient Education ©2016 [employer] ° °

## 2016-08-07 ENCOUNTER — Ambulatory Visit
Admission: RE | Admit: 2016-08-07 | Discharge: 2016-08-07 | Disposition: A | Discharge status: Home / Self Care | Payer: Medicare Other | Source: Ambulatory Visit | Attending: Surgery | Admitting: Surgery

## 2016-08-07 ENCOUNTER — Encounter: Payer: Medicare Other | Admitting: Surgery

## 2016-08-07 ENCOUNTER — Other Ambulatory Visit: Payer: Self-pay | Admitting: Surgery

## 2016-08-07 DIAGNOSIS — M7989 Other specified soft tissue disorders: Secondary | ICD-10-CM | POA: Insufficient documentation

## 2016-08-07 DIAGNOSIS — L97312 Non-pressure chronic ulcer of right ankle with fat layer exposed: Secondary | ICD-10-CM | POA: Diagnosis not present

## 2016-08-07 DIAGNOSIS — I7090 Unspecified atherosclerosis: Secondary | ICD-10-CM | POA: Insufficient documentation

## 2016-08-07 DIAGNOSIS — T1490 Injury, unspecified: Secondary | ICD-10-CM | POA: Insufficient documentation

## 2016-08-07 DIAGNOSIS — S81801A Unspecified open wound, right lower leg, initial encounter: Secondary | ICD-10-CM

## 2016-08-07 DIAGNOSIS — M2011 Hallux valgus (acquired), right foot: Secondary | ICD-10-CM | POA: Diagnosis not present

## 2016-08-08 NOTE — Progress Notes (Signed)
ROMA, BIERLEIN ([id number]) Visit Report for 08/07/2016 Arrival Information Details Patient Name: [name], [name]. Date of Service: 08/07/2016 1:30 PM Medical Record Number: [id number] Patient Account Number: [id number] Date of Birth/Sex: [birth year] (81 y.o. Male) Treating RN: Cornell Barman Primary Care Physician: Unk Pinto Other Clinician: Referring Physician: Unk Pinto Treating Physician/Extender: Frann Rider in Treatment: 1 Visit Information History Since Last Visit Added or deleted any medications: No Patient Arrived: Wheel Chair Any new allergies or adverse reactions: No Arrival Time: 13:38 Had a fall or experienced change in No Accompanied By: daughter activities of daily living that may affect Transfer Assistance: Manual risk of falls: Patient Identification Verified: Yes Signs or symptoms of abuse/neglect No Secondary Verification Process Yes since last visito Completed: Hospitalized since last visit: No Patient Requires Transmission- No Has Dressing in Place as Prescribed: Yes Based Precautions: Has Footwear/Offloading in Place as Yes Patient Has Alerts: Yes Prescribed: Patient Alerts: Patient on Blood Right: Surgical Shoe with Thinner Pressure Relief Insole ASA Pain Present Now: No Electronic Signature(s) Signed: 08/07/2016 1:56:10 PM By: Gretta Cool, RN, BSN, Kim RN, BSN Entered By: Gretta Cool, RN, BSN, Kim on 08/07/2016 13:39:06 [name] ([id number]) -------------------------------------------------------------------------------- Encounter Discharge Information Details Patient Name: [name], [name]. Date of Service: 08/07/2016 1:30 PM Medical Record Number: [id number] Patient Account Number: [id number] Date of Birth/Sex: [birth year] (82 y.o. Male) Treating RN: Cornell Barman Primary Care Physician: Unk Pinto Other Clinician: Referring Physician: Unk Pinto Treating Physician/Extender: Frann Rider in Treatment:  1 Encounter Discharge Information Items Discharge Pain Level: 0 Discharge Condition: Stable Ambulatory Status: Wheelchair Discharge Destination: Home Transportation: Private Auto Accompanied By: daughter Schedule Follow-up Appointment: Yes Medication Reconciliation completed and provided to Patient/Care Yes [name]: Provided on Clinical Summary of Care: 08/07/2016 Form Type Recipient Paper Patient TC Electronic Signature(s) Signed: 08/07/2016 4:43:23 PM By: Gretta Cool RN, BSN, Kim RN, BSN Previous Signature: 08/07/2016 2:18:23 PM Version By: Ruthine Dose Entered By: Gretta Cool RN, BSN, Kim on 08/07/2016 14:23:34 HAU, SANOR ([id number]) -------------------------------------------------------------------------------- Lower Extremity Assessment Details Patient Name: [name], [name]. Date of Service: 08/07/2016 1:30 PM Medical Record Number: [id number] Patient Account Number: [id number] Date of Birth/Sex: 1929-01-01 (82 y.o. Male) Treating RN: Cornell Barman Primary Care Physician: Unk Pinto Other Clinician: Referring Physician: Unk Pinto Treating Physician/Extender: Frann Rider in Treatment: 1 Vascular Assessment Pulses: Posterior Tibial Dorsalis Pedis Palpable: [Right:Yes] Extremity colors, hair growth, and conditions: Extremity Color: [Right:Normal] Hair Growth on Extremity: [Right:No] Temperature of Extremity: [Right:Warm] Capillary Refill: [Right:< 3 seconds] Dependent Rubor: [Right:No] Blanched when Elevated: [Right:No] Lipodermatosclerosis: [Right:No] Toe Nail Assessment Left: Right: Thick: Yes Discolored: Yes Deformed: No Improper Length and Hygiene: Yes Electronic Signature(s) Signed: 08/07/2016 1:56:10 PM By: Gretta Cool, RN, BSN, Kim RN, BSN Entered By: Gretta Cool, RN, BSN, Kim on 08/07/2016 13:45:30 ZAVIOR, THOMASON ([id number]) -------------------------------------------------------------------------------- Multi Wound Chart Details Patient Name:  [name], [name]. Date of Service: 08/07/2016 1:30 PM Medical Record Number: [id number] Patient Account Number: [id number] Date of Birth/Sex: Aug 17, 1929 (82 y.o. Male) Treating RN: Baruch Gouty, RN, BSN, Velva Harman Primary Care Physician: Unk Pinto Other Clinician: Referring Physician: Unk Pinto Treating Physician/Extender: Frann Rider in Treatment: 1 Vital Signs Height(in): 69 Pulse(bpm): 67 Weight(lbs): 259 Blood Pressure 141/47 (mmHg): Body Mass Index(BMI): 38 Temperature(F): 98.1 Respiratory Rate 16 (breaths/min): Photos: [N/A:N/A] Wound Location: Right Malleolus - Lateral Right Toe Great - Medial N/A Wounding Event: Gradually Appeared Gradually Appeared N/A Primary Etiology: Venous Leg Ulcer Pressure Ulcer N/A Comorbid History: Cataracts, Arrhythmia, Cataracts, Arrhythmia, N/A Coronary Artery Disease, Coronary Artery Disease, Hypertension, Gout,  Hypertension, Gout, Osteoarthritis Osteoarthritis Date Acquired: 06/30/2016 07/03/2016 N/A Weeks of Treatment: 1 1 N/A Wound Status: Open Open N/A Measurements L x W x D 1.5x1.8x0.2 0.6x0.8x0.1 N/A (cm) Area (cm) : 2.121 0.377 N/A Volume (cm) : 0.424 0.038 N/A % Reduction in Area: 46.00% -92.30% N/A % Reduction in Volume: 46.00% -90.00% N/A Classification: Full Thickness Without Category/Stage II N/A Exposed Support Structures Exudate Amount: Medium Medium N/A Exudate Type: Serosanguineous Serosanguineous N/A Exudate Color: red, brown red, brown N/A Foul Odor After Yes No N/A CleansingGORDAN, [name] ([id number]) Odor Anticipated Due to No N/A N/A Product Use: Wound Margin: Indistinct, nonvisible Distinct, outline attached N/A Granulation Amount: Small (1-33%) Medium (34-66%) N/A Granulation Quality: Pink, Pale Pink, Pale N/A Necrotic Amount: Large (67-100%) Small (1-33%) N/A Necrotic Tissue: Adherent Slough Eschar, Adherent Slough N/A Exposed Structures: Fascia: No Fascia: No N/A Fat: No Fat:  No Tendon: No Tendon: No Muscle: No Muscle: No Joint: No Joint: No Bone: No Bone: No Limited to Skin Limited to Skin Breakdown Breakdown Epithelialization: None None N/A Periwound Skin Texture: Edema: Yes Edema: No N/A Excoriation: No Excoriation: No Induration: No Induration: No Callus: No Callus: No Crepitus: No Crepitus: No Fluctuance: No Fluctuance: No Friable: No Friable: No Rash: No Rash: No Scarring: No Scarring: No Periwound Skin Maceration: Yes Moist: Yes N/A Moisture: Moist: Yes Maceration: No Dry/Scaly: No Dry/Scaly: No Periwound Skin Color: Atrophie Blanche: No Atrophie Blanche: No N/A Cyanosis: No Cyanosis: No Ecchymosis: No Ecchymosis: No Erythema: No Erythema: No Hemosiderin Staining: No Hemosiderin Staining: No Mottled: No Mottled: No Pallor: No Pallor: No Rubor: No Rubor: No Temperature: No Abnormality No Abnormality N/A Tenderness on Yes Yes N/A Palpation: Wound Preparation: Ulcer Cleansing: Ulcer Cleansing: N/A Rinsed/Irrigated with Rinsed/Irrigated with Saline Saline Topical Anesthetic Topical Anesthetic Applied: Other: lidocaine Applied: Other: lidocaine 4% 4% Treatment Notes [name], [name] ([id number]) Electronic Signature(s) Signed: 08/07/2016 5:32:18 PM By: Regan Lemming BSN, RN Entered By: Regan Lemming on 08/07/2016 14:04:36 [name], [name] ([id number]) -------------------------------------------------------------------------------- Multi-Disciplinary Care Plan Details Patient Name: [name], [name]. Date of Service: 08/07/2016 1:30 PM Medical Record Number: [id number] Patient Account Number: [id number] Date of Birth/Sex: July 20, 1929 (82 y.o. Male) Treating RN: Baruch Gouty, RN, BSN, Velva Harman Primary Care Physician: Unk Pinto Other Clinician: Referring Physician: Unk Pinto Treating Physician/Extender: Frann Rider in Treatment: 1 Active Inactive Orientation to the Wound Care Program Nursing  Diagnoses: Knowledge deficit related to the wound healing center program Goals: Patient/caregiver will verbalize understanding of the [place] Program Date Initiated: 07/31/2016 Goal Status: Active Interventions: Provide education on orientation to the wound center Notes: Venous Leg Ulcer Nursing Diagnoses: Knowledge deficit related to disease process and management Potential for venous Insuffiency (use before diagnosis confirmed) Goals: Non-invasive venous studies are completed as ordered Date Initiated: 07/31/2016 Goal Status: Active Patient will maintain optimal edema control Date Initiated: 07/31/2016 Goal Status: Active Patient/caregiver will verbalize understanding of disease process and disease management Date Initiated: 07/31/2016 Goal Status: Active Verify adequate tissue perfusion prior to therapeutic compression application Date Initiated: 07/31/2016 Goal Status: Active Interventions: Assess peripheral edema status every visit. [name], [name] ([id number]) Compression as ordered Provide education on venous insufficiency Treatment Activities: Therapeutic compression applied : 07/31/2016 Notes: Wound/Skin Impairment Nursing Diagnoses: Impaired tissue integrity Knowledge deficit related to ulceration/compromised skin integrity Goals: Patient/caregiver will verbalize understanding of skin care regimen Date Initiated: 07/31/2016 Goal Status: Active Ulcer/skin breakdown will have a volume reduction of 30% by week 4 Date Initiated: 07/31/2016 Goal Status: Active Ulcer/skin breakdown will have  a volume reduction of 50% by week 8 Date Initiated: 07/31/2016 Goal Status: Active Ulcer/skin breakdown will have a volume reduction of 80% by week 12 Date Initiated: 07/31/2016 Goal Status: Active Ulcer/skin breakdown will heal within 14 weeks Date Initiated: 07/31/2016 Goal Status: Active Interventions: Assess patient/caregiver ability to obtain necessary  supplies Assess patient/caregiver ability to perform ulcer/skin care regimen upon admission and as needed Assess ulceration(s) every visit Provide education on ulcer and skin care Treatment Activities: Referred to DME Gwendalynn Eckstrom for dressing supplies : 07/31/2016 Skin care regimen initiated : 07/31/2016 Topical wound management initiated : 07/31/2016 Notes: Electronic Signature(s) [name], [name] ([id number]) Signed: 08/07/2016 5:32:18 PM By: Regan Lemming BSN, RN Entered By: Regan Lemming on 08/07/2016 14:04:24 [name] ([id number]) -------------------------------------------------------------------------------- Pain Assessment Details Patient Name: [name], [name]. Date of Service: 08/07/2016 1:30 PM Medical Record Number: [id number] Patient Account Number: [id number] Date of Birth/Sex: 1929-10-31 (82 y.o. Male) Treating RN: Cornell Barman Primary Care Physician: Unk Pinto Other Clinician: Referring Physician: Unk Pinto Treating Physician/Extender: Frann Rider in Treatment: 1 Active Problems Location of Pain Severity and Description of Pain Patient Has Paino No Site Locations With Dressing Change: No Pain Management and Medication Current Pain Management: Electronic Signature(s) Signed: 08/07/2016 1:56:10 PM By: Gretta Cool, RN, BSN, Kim RN, BSN Entered By: Gretta Cool, RN, BSN, Kim on 08/07/2016 13:39:14 [name] ([id number]) -------------------------------------------------------------------------------- Patient/Caregiver Education Details Patient Name: [name] Date of Service: 08/07/2016 1:30 PM Medical Record Number: [id number] Patient Account Number: [id number] Date of Birth/Gender: [birth year] (82 y.o. Male) Treating RN: Cornell Barman Primary Care Physician: Unk Pinto Other Clinician: Referring Physician: Unk Pinto Treating Physician/Extender: Frann Rider in Treatment: 1 Education Assessment Education Provided  To: Patient Education Topics Provided Wound/Skin Impairment: Handouts: Caring for Your Ulcer, Other: continue wound care as prescribed Methods: Demonstration Responses: State content correctly Electronic Signature(s) Signed: 08/07/2016 4:43:23 PM By: Gretta Cool, RN, BSN, Kim RN, BSN Entered By: Gretta Cool, RN, BSN, Kim on 08/07/2016 14:23:59 [name], GALLICK ([id number]) -------------------------------------------------------------------------------- Wound Assessment Details Patient Name: JASIER, CALABRETTA. Date of Service: 08/07/2016 1:30 PM Medical Record Number: [id number] Patient Account Number: [id number] Date of Birth/Sex: 09-12-1929 (82 y.o. Male) Treating RN: Cornell Barman Primary Care Physician: Unk Pinto Other Clinician: Referring Physician: Unk Pinto Treating Physician/Extender: Frann Rider in Treatment: 1 Wound Status Wound Number: 1 Primary Venous Leg Ulcer Etiology: Wound Location: Right Malleolus - Lateral Wound Open Wounding Event: Gradually Appeared Status: Date Acquired: 06/30/2016 Comorbid Cataracts, Arrhythmia, Coronary Artery Weeks Of Treatment: 1 History: Disease, Hypertension, Gout, Clustered Wound: No Osteoarthritis Photos Wound Measurements Length: (cm) 1.5 Width: (cm) 1.8 Depth: (cm) 0.2 Area: (cm) 2.121 Volume: (cm) 0.424 % Reduction in Area: 46% % Reduction in Volume: 46% Epithelialization: None Tunneling: No Undermining: No Wound Description Full Thickness Without Exposed Classification: Support Structures Wound Margin: Indistinct, nonvisible Exudate Medium Amount: Exudate Type: Serosanguineous Exudate Color: red, brown Foul Odor After Cleansing: Yes Due to Product Use: No Wound Bed Granulation Amount: Small (1-33%) Exposed Structure Granulation Quality: Pink, Pale Fascia Exposed: No Necrotic Amount: Large (67-100%) Fat Layer Exposed: No Necrotic Quality: Adherent Slough Tendon Exposed: No Muscle Exposed:  No MIGUELANGEL, KORN ([id number]) Joint Exposed: No Bone Exposed: No Limited to Skin Breakdown Periwound Skin Texture Texture Color No Abnormalities Noted: No No Abnormalities Noted: No Callus: No Atrophie Blanche: No Crepitus: No Cyanosis: No Excoriation: No Ecchymosis: No Fluctuance: No Erythema: No Friable: No Hemosiderin Staining: No Induration: No Mottled: No Localized Edema: Yes Pallor: No Rash: No Rubor:  No Scarring: No Temperature / Pain Moisture Temperature: No Abnormality No Abnormalities Noted: No Tenderness on Palpation: Yes Dry / Scaly: No Maceration: Yes Moist: Yes Wound Preparation Ulcer Cleansing: Rinsed/Irrigated with Saline Topical Anesthetic Applied: Other: lidocaine 4%, Treatment Notes Wound #1 (Right, Lateral Malleolus) 1. Cleansed with: Clean wound with Normal Saline 2. Anesthetic Topical Lidocaine 4% cream to wound bed prior to debridement 4. Dressing Applied: Santyl Ointment 5. Secondary [place] Notes medial met head Environmental health practitioner) Signed: 08/07/2016 1:56:10 PM By: Gretta Cool, RN, BSN, Kim RN, BSN Entered By: Gretta Cool, RN, BSN, Kim on 08/07/2016 13:47:28 NIVIN, BRANIFF ([id number]) -------------------------------------------------------------------------------- Wound Assessment Details Patient Name: MAYSEN, SUDOL. Date of Service: 08/07/2016 1:30 PM Medical Record Number: [id number] Patient Account Number: [id number] Date of Birth/Sex: 1929-09-27 (82 y.o. Male) Treating RN: Cornell Barman Primary Care Physician: Unk Pinto Other Clinician: Referring Physician: Unk Pinto Treating Physician/Extender: Frann Rider in Treatment: 1 Wound Status Wound Number: 2 Primary Pressure Ulcer Etiology: Wound Location: Right Toe Great - Medial Wound Open Wounding Event: Gradually Appeared Status: Date Acquired: 07/03/2016 Comorbid Cataracts, Arrhythmia, Coronary Artery Weeks Of Treatment:  1 History: Disease, Hypertension, Gout, Clustered Wound: No Osteoarthritis Photos Wound Measurements Length: (cm) 0.6 Width: (cm) 0.8 Depth: (cm) 0.1 Area: (cm) 0.377 Volume: (cm) 0.038 % Reduction in Area: -92.3% % Reduction in Volume: -90% Epithelialization: None Tunneling: No Undermining: No Wound Description Classification: Category/Stage II Wound Margin: Distinct, outline attached Exudate Amount: Medium Exudate Type: Serosanguineous Exudate Color: red, brown Foul Odor After Cleansing: No Wound Bed Granulation Amount: Medium (34-66%) Exposed Structure Granulation Quality: Pink, Pale Fascia Exposed: No Necrotic Amount: Small (1-33%) Fat Layer Exposed: No Necrotic Quality: Eschar, Adherent Slough Tendon Exposed: No Muscle Exposed: No Joint Exposed: No Bone Exposed: No ALEKSEI, GOODLIN. ([id number]) Limited to Skin Breakdown Periwound Skin Texture Texture Color No Abnormalities Noted: No No Abnormalities Noted: No Callus: No Atrophie Blanche: No Crepitus: No Cyanosis: No Excoriation: No Ecchymosis: No Fluctuance: No Erythema: No Friable: No Hemosiderin Staining: No Induration: No Mottled: No Localized Edema: No Pallor: No Rash: No Rubor: No Scarring: No Temperature / Pain Moisture Temperature: No Abnormality No Abnormalities Noted: No Tenderness on Palpation: Yes Dry / Scaly: No Maceration: No Moist: Yes Wound Preparation Ulcer Cleansing: Rinsed/Irrigated with Saline Topical Anesthetic Applied: Other: lidocaine 4%, Treatment Notes Wound #2 (Right, Medial Toe Great) 1. Cleansed with: Clean wound with Normal Saline 2. Anesthetic Topical Lidocaine 4% cream to wound bed prior to debridement 4. Dressing Applied: Santyl Ointment 5. Secondary [place] Signature(s) Signed: 08/07/2016 1:56:10 PM By: Gretta Cool, RN, BSN, Kim RN, BSN Entered By: Gretta Cool, RN, BSN, Kim on 08/07/2016 13:47:54 LUCION, DILGER  ([id number]) -------------------------------------------------------------------------------- [place] Details Patient Name: HANIF, RADIN. Date of Service: 08/07/2016 1:30 PM Medical Record Number: [id number] Patient Account Number: [id number] Date of Birth/Sex: [birth year] (82 y.o. Male) Treating RN: Cornell Barman Primary Care Physician: Unk Pinto Other Clinician: Referring Physician: Unk Pinto Treating Physician/Extender: Frann Rider in Treatment: 1 Vital Signs Time Taken: 13:39 Temperature (F): 98.1 Height (in): 69 Pulse (bpm): 67 Weight (lbs): 259 Respiratory Rate (breaths/min): 16 Body Mass Index (BMI): 38.2 Blood Pressure (mmHg): 141/47 Reference Range: 80 - 120 mg / dl Electronic Signature(s) Signed: 08/07/2016 1:56:10 PM By: Gretta Cool, RN, BSN, Kim RN, BSN Entered By: Gretta Cool, RN, BSN, Kim on 08/07/2016 13:40:17

## 2016-08-08 NOTE — Progress Notes (Signed)
[name] ([id number]) Visit Report for 08/07/2016 Chief Complaint Document Details Patient Name: [name], [name]. Date of Service: 08/07/2016 1:30 PM Medical Record Number: [id number] Patient Account Number: [id number] Date of Birth/Sex: 12-Aug-1929 (82 y.o. Male) Treating RN: Cornell Barman Primary Care Physician: Unk Pinto Other Clinician: Referring Physician: Unk Pinto Treating Physician/Extender: Frann Rider in Treatment: 1 Information Obtained from: Patient Chief Complaint Patient presents to the wound care center for a consult due non healing wound the right lateral ankle and medial foot which she's had for about a month. Electronic Signature(s) Signed: 08/07/2016 2:24:21 PM By: Christin Fudge MD, FACS Entered By: Christin Fudge on 08/07/2016 14:24:21 [name] ([id number]) -------------------------------------------------------------------------------- Debridement Details Patient Name: [name], [name]. Date of Service: 08/07/2016 1:30 PM Medical Record Number: [id number] Patient Account Number: [id number] Date of Birth/Sex: Nov 27, 1928 (82 y.o. Male) Treating RN: Cornell Barman Primary Care Physician: Unk Pinto Other Clinician: Referring Physician: Unk Pinto Treating Physician/Extender: Frann Rider in Treatment: 1 Debridement Performed for Wound #1 Right,Lateral Malleolus Assessment: Performed By: Physician Christin Fudge, MD Debridement: Debridement Pre-procedure Yes - 14:04 Verification/Time Out Taken: Start Time: 14:04 Pain Control: Lidocaine 4% Topical Solution Level: Skin/Subcutaneous Tissue Total Area Debrided (L x 1.5 (cm) x 1.8 (cm) = 2.7 (cm) W): Tissue and other Viable, Non-Viable, Exudate, Fibrin/Slough, Subcutaneous material debrided: Instrument: Curette Bleeding: Minimum Hemostasis Achieved: Pressure End Time: 14:09 Procedural Pain: 0 Post Procedural Pain: 0 Response to Treatment: Procedure was  tolerated well Post Debridement Measurements of Total Wound Length: (cm) 1.5 Width: (cm) 1.8 Depth: (cm) 0.2 Volume: (cm) 0.424 Character of Wound/Ulcer Post Stable Debridement: Severity of Tissue Post Debridement: Fat layer exposed Post Procedure Diagnosis Same as Pre-procedure Electronic Signature(s) Signed: 08/07/2016 2:23:54 PM By: Christin Fudge MD, FACS Signed: 08/07/2016 4:43:23 PM By: Gretta Cool RN, BSN, Kim RN, BSN Entered By: Christin Fudge on 08/07/2016 14:23:53 [name], [name] ([name], [name] ([id number]) -------------------------------------------------------------------------------- Debridement Details Patient Name: [name], [name]. Date of Service: 08/07/2016 1:30 PM Medical Record Number: [id number] Patient Account Number: [id number] Date of Birth/Sex: August 21, 1929 (83 y.o. Male) Treating RN: Cornell Barman Primary Care Physician: Unk Pinto Other Clinician: Referring Physician: Unk Pinto Treating Physician/Extender: Frann Rider in Treatment: 1 Debridement Performed for Wound #2 Right,Medial Toe Great Assessment: Performed By: Physician Christin Fudge, MD Debridement: Debridement Pre-procedure Yes - 14:04 Verification/Time Out Taken: Start Time: 14:04 Pain Control: Lidocaine 4% Topical Solution Level: Skin/Subcutaneous Tissue Total Area Debrided (L x 0.6 (cm) x 0.8 (cm) = 0.48 (cm) W): Tissue and other Viable, Non-Viable, Exudate, Fibrin/Slough, Subcutaneous material debrided: Instrument: Curette Bleeding: Minimum Hemostasis Achieved: Pressure End Time: 14:09 Procedural Pain: 0 Post Procedural Pain: 0 Response to Treatment: Procedure was tolerated well Post Debridement Measurements of Total Wound Length: (cm) 0.6 Stage: Category/Stage II Width: (cm) 0.8 Depth: (cm) 0.1 Volume: (cm) 0.038 Character of Wound/Ulcer Post Stable Debridement: Severity of Tissue Post Fat layer exposed Debridement: Post Procedure  Diagnosis Same as Pre-procedure Electronic Signature(s) Signed: 08/07/2016 2:24:14 PM By: Christin Fudge MD, FACS Signed: 08/07/2016 4:43:23 PM By: Gretta Cool RN, BSN, Kim RN, BSN Entered By: Christin Fudge on 08/07/2016 14:24:14 [name], [name] ([name], [name] ([id number]) -------------------------------------------------------------------------------- HPI Details Patient Name: [name], [name]. Date of Service: 08/07/2016 1:30 PM Medical Record Number: [id number] Patient Account Number: [id number] Date of Birth/Sex: 1929/08/06 (82 y.o. Male) Treating RN: Cornell Barman Primary Care Physician: Unk Pinto Other Clinician: Referring Physician: Unk Pinto Treating Physician/Extender: Frann Rider in Treatment: 1 History of Present Illness Location:  right lateral ankle and medial foot Quality: Patient reports experiencing a dull pain to affected area(s). Severity: Patient states wound are getting worse. Duration: Patient has had the wound for < 4 weeks prior to presenting for treatment Timing: Pain in wound is Intermittent (comes and goes Context: The wound appeared gradually over time Modifying Factors: Other treatment(s) tried include:as been put on doxycycline and is taking local care with hydrogen peroxide Associated Signs and Symptoms: Patient reports having increase swelling. 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 his past medical history is significant for anemia, arthritis, renal cancer with nephrectomy done in June 2013, lung cancer, supposed neck surgery, appendectomy, low back surgery, cholecystectomy, coronary angioplasty, laparoscopic left nephrectomy. PCP recently started him on doxycycline 100 mg. He is not a smoker. 08/07/2016 -- arterial and venous studies are still pending and his  x-rays have not been done yet Electronic Signature(s) Signed: 08/07/2016 2:24:45 PM By: Christin Fudge MD, FACS Entered By: Christin Fudge on 08/07/2016 14:24:44 [name] ([id number]) -------------------------------------------------------------------------------- Physical Exam Details Patient Name: [name], [name]. Date of Service: 08/07/2016 1:30 PM Medical Record Number: [id number] Patient Account Number: [id number] Date of Birth/Sex: May 23, 1929 (82 y.o. Male) Treating RN: Cornell Barman Primary Care Physician: Unk Pinto Other Clinician: Referring Physician: Unk Pinto Treating Physician/Extender: Frann Rider in Treatment: 1 Constitutional . Pulse regular. Respirations normal and unlabored. Afebrile. . Eyes Nonicteric. Reactive to light. Ears, Nose, Mouth, and Throat Lips, teeth, and gums WNL.Marland Kitchen Moist mucosa without lesions. Neck supple and nontender. No palpable supraclavicular or cervical adenopathy. Normal sized without goiter. Respiratory WNL. No retractions.. Cardiovascular Pedal Pulses WNL. No clubbing, cyanosis or edema. Lymphatic No adneopathy. No adenopathy. No adenopathy. Musculoskeletal Adexa without tenderness or enlargement.. Digits and nails w/o clubbing, cyanosis, infection, petechiae, ischemia, or inflammatory conditions.. Integumentary (Hair, Skin) No suspicious lesions. No crepitus or fluctuance. No peri-wound warmth or erythema. No masses.Marland Kitchen Psychiatric Judgement and insight Intact.. No evidence of depression, anxiety, or agitation.. Notes has a prominent bunion on his right forefoot in the region of the first metatarsal head and has got significant callus and an ulcerated area which has slough and was sharply debrided with a #3 curet. in the right lateral malleolus again he has a significant ulceration with slough and this was sharply debrided with a #3 curet and bleeding controlled with pressure Electronic Signature(s) Signed:  08/07/2016 2:25:37 PM By: Christin Fudge MD, FACS Entered By: Christin Fudge on 08/07/2016 14:25:37 [name] ([id number]) -------------------------------------------------------------------------------- Physician Orders Details Patient Name: [name], [name]. Date of Service: 08/07/2016 1:30 PM Medical Record Number: [id number] Patient Account Number: [id number] Date of Birth/Sex: 22-Mar-1929 (82 y.o. Male) Treating RN: Baruch Gouty, RN, BSN, Velva Harman Primary Care Physician: Unk Pinto Other Clinician: Referring Physician: Unk Pinto Treating Physician/Extender: Frann Rider in Treatment: 1 Verbal / Phone Orders: Yes Clinician: Afful, RN, BSN, Rita Read Back and Verified: Yes Diagnosis Coding Wound Cleansing Wound #1 Right,Lateral Malleolus o Cleanse wound with mild soap and water o May Shower, gently pat wound dry prior to applying new dressing. Wound #2 Right,Medial Toe Great o Cleanse wound with mild soap and water o May Shower, gently pat wound dry prior to applying new dressing. Anesthetic Wound #1 Right,Lateral Malleolus o Topical Lidocaine 4% cream applied to wound bed prior to debridement Wound #2 Right,Medial Toe Great o Topical Lidocaine 4% cream applied to wound bed prior to debridement  Skin Barriers/Peri-Wound Care Wound #1 Right,Lateral Malleolus o Barrier cream Wound #2 Right,Medial Toe Great o Barrier cream Primary Wound Dressing Wound #1 Right,Lateral Malleolus o Santyl Ointment Wound #2 Right,Medial Toe Great o Sorbalgon Ag Secondary Dressing Wound #1 Right,Lateral Malleolus o Dry Gauze o Boardered Foam Dressing Wound #2 [address] ([id number]) o Dry Gauze o Boardered Foam Dressing Dressing Change Frequency Wound #1 Right,Lateral Malleolus o Change dressing every day. Wound #2 Right,Medial Toe Great o Change dressing every other day. Follow-up Appointments Wound #1  Right,Lateral Malleolus o Return Appointment in 1 week. Wound #2 Right,Medial Toe Great o Return Appointment in 1 week. Off-Loading Wound #1 Right,Lateral Malleolus o Open toe surgical shoe with peg assist. Wound #2 Right,Medial Toe Great o Open toe surgical shoe with peg assist. Additional Orders / Instructions Wound #1 Right,Lateral Malleolus o Increase protein intake. o Activity as tolerated o Other: - MVI, Vit A, C, Zinc Wound #2 Right,Medial Toe Great o Increase protein intake. o Activity as tolerated o Other: - MVI, Vit A, C, Zinc Electronic Signature(s) Signed: 08/07/2016 4:25:16 PM By: Christin Fudge MD, FACS Signed: 08/07/2016 5:32:18 PM By: Regan Lemming BSN, RN Entered By: Regan Lemming on 08/07/2016 14:09:17 KAVONTAE, PRITCHARD ([id number]) -------------------------------------------------------------------------------- Problem List Details Patient Name: [name], [name]. Date of Service: 08/07/2016 1:30 PM Medical Record Number: [id number] Patient Account Number: [id number] Date of Birth/Sex: 1929-07-31 (82 y.o. Male) Treating RN: Cornell Barman Primary Care Physician: Unk Pinto Other Clinician: Referring Physician: Unk Pinto Treating Physician/Extender: Frann Rider in Treatment: 1 Active Problems ICD-10 Encounter Code Description Active Date Diagnosis [phone] Non-pressure chronic ulcer of right ankle with fat layer 07/31/2016 Yes exposed L97.512 Non-pressure chronic ulcer of other part of right foot with 07/31/2016 Yes fat layer exposed I73.9 Peripheral vascular disease, unspecified 07/31/2016 Yes M21.611 Bunion of right foot 07/31/2016 Yes I89.0 Lymphedema, not elsewhere classified 07/31/2016 Yes E66.9 Obesity, unspecified 07/31/2016 Yes Inactive Problems Resolved Problems Electronic Signature(s) Signed: 08/07/2016 2:23:40 PM By: Christin Fudge MD, FACS Entered By: Christin Fudge on 08/07/2016 14:23:40 [name]  ([id number]) -------------------------------------------------------------------------------- Progress Note Details Patient Name: [name] Date of Service: 08/07/2016 1:30 PM Medical Record Number: [id number] Patient Account Number: [id number] Date of Birth/Sex: [birth year] (82 y.o. Male) Treating RN: Cornell Barman Primary Care Physician: Unk Pinto Other Clinician: Referring Physician: Unk Pinto Treating Physician/Extender: Frann Rider in Treatment: 1 Subjective Chief Complaint Information obtained from Patient Patient presents to the wound care center for a consult due non healing wound the right lateral ankle and medial foot which she's had for about a month. History of Present Illness (HPI) The following HPI elements were documented for the patient's wound: Location: right lateral ankle and medial foot Quality: Patient reports experiencing a dull pain to affected area(s). Severity: Patient states wound are getting worse. Duration: Patient has had the wound for < 4 weeks prior to presenting for treatment Timing: Pain in wound is Intermittent (comes and goes Context: The wound appeared gradually over time Modifying Factors: Other treatment(s) tried include:as been put on doxycycline and is taking local care with hydrogen peroxide Associated Signs and Symptoms: Patient reports having increase swelling. 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 his past medical history is significant for anemia, arthritis, renal cancer with nephrectomy done in June 2013, lung cancer, supposed neck surgery, appendectomy, low back surgery, cholecystectomy, coronary angioplasty, laparoscopic left nephrectomy.  PCP recently started him on doxycycline 100 mg. He is not a smoker. 08/07/2016 -- arterial and venous studies are still  pending and his x-rays have not been done yet Objective Constitutional Pulse regular. Respirations normal and unlabored. Afebrile. Vitals Time Taken: 1:39 PM, Height: 69 in, Weight: 259 lbs, BMI: 38.2, Temperature: 98.1 F, Pulse: 67 bpm, Respiratory Rate: 16 breaths/min, Blood Pressure: 141/47 mmHg. AYO, SMOAK ([id number]) Eyes Nonicteric. Reactive to light. Ears, Nose, Mouth, and Throat Lips, teeth, and gums WNL.Marland Kitchen Moist mucosa without lesions. Neck supple and nontender. No palpable supraclavicular or cervical adenopathy. Normal sized without goiter. Respiratory WNL. No retractions.. Cardiovascular Pedal Pulses WNL. No clubbing, cyanosis or edema. Lymphatic No adneopathy. No adenopathy. No adenopathy. Musculoskeletal Adexa without tenderness or enlargement.. Digits and nails w/o clubbing, cyanosis, infection, petechiae, ischemia, or inflammatory conditions.Marland Kitchen Psychiatric Judgement and insight Intact.. No evidence of depression, anxiety, or agitation.. General Notes: has a prominent bunion on his right forefoot in the region of the first metatarsal head and has got significant callus and an ulcerated area which has slough and was sharply debrided with a #3 curet. in the right lateral malleolus again he has a significant ulceration with slough and this was sharply debrided with a #3 curet and bleeding controlled with pressure Integumentary (Hair, Skin) No suspicious lesions. No crepitus or fluctuance. No peri-wound warmth or erythema. No masses.. Wound #1 status is Open. Original cause of wound was Gradually Appeared. The wound is located on the Right,Lateral Malleolus. The wound measures 1.5cm length x 1.8cm width x 0.2cm depth; 2.121cm^2 area and 0.424cm^3 volume. The wound is limited to skin breakdown. There is no tunneling or undermining noted. There is a medium amount of serosanguineous drainage noted. The wound margin is indistinct and nonvisible. There is small (1-33%)  pink, pale granulation within the wound bed. There is a large (67-100%) amount of necrotic tissue within the wound bed including Adherent Slough. The periwound skin appearance exhibited: Localized Edema, Maceration, Moist. The periwound skin appearance did not exhibit: Callus, Crepitus, Excoriation, Fluctuance, Friable, Induration, Rash, Scarring, Dry/Scaly, Atrophie Blanche, Cyanosis, Ecchymosis, Hemosiderin Staining, Mottled, Pallor, Rubor, Erythema. Periwound temperature was noted as No Abnormality. The periwound has tenderness on palpation. Wound #2 status is Open. Original cause of wound was Gradually Appeared. The wound is located on the [employer]. The wound measures 0.6cm length x 0.8cm width x 0.1cm depth; 0.377cm^2 area and 0.038cm^3 volume. The wound is limited to skin breakdown. There is no tunneling or undermining noted. There is a medium amount of serosanguineous drainage noted. The wound margin is distinct with the outline attached to the wound base. There is medium (34-66%) pink, pale granulation within the wound GERARDO, CAIAZZO. ([id number]) bed. There is a small (1-33%) amount of necrotic tissue within the wound bed including Eschar and Adherent Slough. The periwound skin appearance exhibited: Moist. The periwound skin appearance did not exhibit: Callus, Crepitus, Excoriation, Fluctuance, Friable, Induration, Localized Edema, Rash, Scarring, Dry/Scaly, Maceration, Atrophie Blanche, Cyanosis, Ecchymosis, Hemosiderin Staining, Mottled, Pallor, Rubor, Erythema. Periwound temperature was noted as No Abnormality. The periwound has tenderness on palpation. 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 There was a Skin/Subcutaneous Tissue Debridement ([id number]) debridement with total area of 2.7 sq cm performed by Christin Fudge, MD. with the following instrument(s): Curette to remove Viable and Non-Viable tissue/material including Exudate, Fibrin/Slough, and Subcutaneous after achieving pain control using Lidocaine 4% Topical Solution. A time out was conducted at 14:04, prior to the start of the procedure. A Minimum amount of bleeding was controlled with Pressure. The procedure was tolerated well with a pain level of 0 throughout and a pain level of 0 following the procedure. Post Debridement Measurements: 1.5cm length x 1.8cm width x 0.2cm depth; 0.424cm^3 volume. Character of Wound/Ulcer Post Debridement is stable. Severity of Tissue Post Debridement is: Fat layer exposed. Post procedure Diagnosis Wound #1: Same as Pre-Procedure Wound #2 Wound #2 is a Pressure Ulcer located on the Right,Medial Toe Great . There was a Skin/Subcutaneous Tissue Debridement ([id number]) debridement with total area of 0.48 sq cm performed by Christin Fudge, MD. with the following instrument(s): Curette to remove Viable and Non-Viable tissue/material including Exudate, Fibrin/Slough, and Subcutaneous after achieving pain control using Lidocaine 4% Topical Solution. A time out was conducted at 14:04, prior to the start of the procedure. A Minimum amount of bleeding was controlled with Pressure. The procedure was tolerated well with a pain level of 0 throughout Foristell, ARNOLDO HILDRETH. ([id number]) and a pain level of 0 following the procedure. Post Debridement Measurements: 0.6cm length x 0.8cm width x 0.1cm depth; 0.038cm^3 volume. Post debridement Stage noted as Category/Stage II. Character of Wound/Ulcer Post Debridement is stable. Severity of Tissue Post Debridement is: Fat  layer exposed. Post procedure Diagnosis Wound #2: Same as Pre-Procedure Plan Wound Cleansing: Wound #1 Right,Lateral Malleolus: Cleanse wound with mild soap and water May Shower, gently pat wound dry prior to applying new dressing. Wound #2 Right,Medial Toe Great: Cleanse wound with mild soap and water May Shower, gently pat wound dry prior to applying new dressing. Anesthetic: Wound #1 Right,Lateral Malleolus: Topical Lidocaine 4% cream applied to wound bed prior to debridement Wound #2 Right,Medial Toe Great: Topical Lidocaine 4% cream applied to wound bed prior to debridement Skin Barriers/Peri-Wound Care: Wound #1 Right,Lateral Malleolus: Barrier cream Wound #2 Right,Medial Toe Great: Barrier cream Primary Wound Dressing: Wound #1 Right,Lateral Malleolus: Santyl Ointment Wound #2 Right,Medial Toe Great: Sorbalgon Ag Secondary Dressing: Wound #1 Right,Lateral Malleolus: Dry Gauze Boardered Foam Dressing Wound #2 Right,Medial Toe Great: Dry Gauze Boardered Foam Dressing Dressing Change Frequency: Wound #1 Right,Lateral Malleolus: Change dressing every day. Wound #2 Right,Medial Toe Great: Change dressing every other day. Follow-up Appointments: Wound #1 Right,Lateral Malleolus: Return Appointment in 1 week. BERYLE, ZEITZ ([id number]) Wound #2 Right,Medial Toe Great: Return Appointment in 1 week. Off-Loading: Wound #1 Right,Lateral Malleolus: Open toe surgical shoe with peg assist. Wound #2 Right,Medial Toe Great: Open toe surgical shoe with peg assist. Additional Orders / Instructions: Wound #1 Right,Lateral Malleolus: Increase protein intake. Activity as tolerated Other: - MVI, Vit A, C, Zinc Wound #2 Right,Medial Toe Great: Increase protein intake. Activity as tolerated Other: - MVI, Vit A, C, Zinc I have recommended: 1. Santyl ointment locally to the right lateral malleolus to be changed daily. 2. Aquacel Ag to the left forefoot and a protective  foam bandage and offloading of this area as much as possible 3. Arterial and venous duplex studies of both lower extremities -- to be done next week 4. x-ray of the right ankle and left forefoot -- to be done today 5. Podiatary consult for opinion regarding his bunion 6.  Adequate nutrition with protein and vitamin A, vitamin C and zinc Electronic Signature(s) Signed: 08/07/2016 2:26:31 PM By: Christin Fudge MD, FACS Entered By: Christin Fudge on 08/07/2016 14:26:30 GUENTHER, DUNSHEE ([id number]) -------------------------------------------------------------------------------- SuperBill Details Patient Name: [name] Date of Service: 08/07/2016 Medical Record Number: [id number] Patient Account Number: [id number] Date of Birth/Sex: Sep 04, 1929 (82 y.o. Male) Treating RN: Cornell Barman Primary Care Physician: Unk Pinto Other Clinician: Referring Physician: Unk Pinto Treating Physician/Extender: Frann Rider in Treatment: 1 Diagnosis Coding ICD-10 Codes Code Description [phone] Non-pressure chronic ulcer of right ankle with fat layer exposed L97.512 Non-pressure chronic ulcer of other part of right foot with fat layer exposed I73.9 Peripheral vascular disease, unspecified M21.611 Bunion of right foot I89.0 Lymphedema, not elsewhere classified E66.9 Obesity, unspecified Facility Procedures CPT4 Code Description: [id number] 11042 - DEB SUBQ TISSUE 20 SQ CM/< ICD-10 Description Diagnosis L97.312 Non-pressure chronic ulcer of right ankle with fat l L97.512 Non-pressure chronic ulcer of other part of right fo I73.9 Peripheral vascular disease,  unspecified I89.0 Lymphedema, not elsewhere classified Modifier: ayer exposed ot with fat la Quantity: 1 yer exposed Physician Procedures CPT4 Code Description: [id number] 11042 - WC PHYS SUBQ TISS 20 SQ CM ICD-10 Description Diagnosis L97.312 Non-pressure chronic ulcer of right ankle with fat l L97.512 Non-pressure chronic ulcer of  other part of right fo I73.9 Peripheral vascular disease,  unspecified I89.0 Lymphedema, not elsewhere classified Modifier: ayer exposed ot with fat lay Quantity: 1 er exposed Electronic Signature(s) Signed: 08/07/2016 2:26:57 PM By: Christin Fudge MD, FACS Entered By: Christin Fudge on 08/07/2016 14:26:56

## 2016-08-11 ENCOUNTER — Other Ambulatory Visit: Payer: Self-pay | Admitting: Surgery

## 2016-08-11 ENCOUNTER — Ambulatory Visit (HOSPITAL_COMMUNITY)
Admission: RE | Admit: 2016-08-11 | Discharge: 2016-08-11 | Disposition: A | Discharge status: Home / Self Care | Payer: Medicare Other | Source: Ambulatory Visit | Attending: Surgery | Admitting: Surgery

## 2016-08-11 DIAGNOSIS — I739 Peripheral vascular disease, unspecified: Secondary | ICD-10-CM | POA: Diagnosis not present

## 2016-08-11 DIAGNOSIS — S81801A Unspecified open wound, right lower leg, initial encounter: Secondary | ICD-10-CM

## 2016-08-14 ENCOUNTER — Encounter: Payer: Self-pay | Admitting: General Surgery

## 2016-08-14 ENCOUNTER — Encounter: Payer: Medicare Other | Attending: General Surgery | Admitting: General Surgery

## 2016-08-14 DIAGNOSIS — Z87891 Personal history of nicotine dependence: Secondary | ICD-10-CM | POA: Insufficient documentation

## 2016-08-14 DIAGNOSIS — L97312 Non-pressure chronic ulcer of right ankle with fat layer exposed: Secondary | ICD-10-CM | POA: Diagnosis not present

## 2016-08-14 DIAGNOSIS — C7800 Secondary malignant neoplasm of unspecified lung: Secondary | ICD-10-CM | POA: Diagnosis not present

## 2016-08-14 DIAGNOSIS — M21611 Bunion of right foot: Secondary | ICD-10-CM | POA: Diagnosis not present

## 2016-08-14 DIAGNOSIS — I2510 Atherosclerotic heart disease of native coronary artery without angina pectoris: Secondary | ICD-10-CM | POA: Insufficient documentation

## 2016-08-14 DIAGNOSIS — I890 Lymphedema, not elsewhere classified: Secondary | ICD-10-CM | POA: Diagnosis not present

## 2016-08-14 DIAGNOSIS — Z6838 Body mass index (BMI) 38.0-38.9, adult: Secondary | ICD-10-CM | POA: Diagnosis not present

## 2016-08-14 DIAGNOSIS — Z79899 Other long term (current) drug therapy: Secondary | ICD-10-CM | POA: Diagnosis not present

## 2016-08-14 DIAGNOSIS — M1990 Unspecified osteoarthritis, unspecified site: Secondary | ICD-10-CM | POA: Diagnosis not present

## 2016-08-14 DIAGNOSIS — E669 Obesity, unspecified: Secondary | ICD-10-CM | POA: Insufficient documentation

## 2016-08-14 DIAGNOSIS — I739 Peripheral vascular disease, unspecified: Secondary | ICD-10-CM | POA: Diagnosis not present

## 2016-08-14 DIAGNOSIS — I10 Essential (primary) hypertension: Secondary | ICD-10-CM | POA: Diagnosis not present

## 2016-08-14 DIAGNOSIS — C642 Malignant neoplasm of left kidney, except renal pelvis: Secondary | ICD-10-CM | POA: Insufficient documentation

## 2016-08-14 DIAGNOSIS — E7800 Pure hypercholesterolemia, unspecified: Secondary | ICD-10-CM | POA: Insufficient documentation

## 2016-08-14 DIAGNOSIS — R7303 Prediabetes: Secondary | ICD-10-CM | POA: Diagnosis not present

## 2016-08-14 DIAGNOSIS — M109 Gout, unspecified: Secondary | ICD-10-CM | POA: Insufficient documentation

## 2016-08-14 DIAGNOSIS — L97512 Non-pressure chronic ulcer of other part of right foot with fat layer exposed: Secondary | ICD-10-CM | POA: Insufficient documentation

## 2016-08-14 NOTE — Progress Notes (Signed)
See I heal 

## 2016-08-14 NOTE — Progress Notes (Addendum)
[name], [name] ([id number]) Visit Report for 08/14/2016 Chief Complaint Document Details Patient Name: [name], [name]. Date of Service: 08/14/2016 8:00 AM Medical Record Number: [id number] Patient Account Number: [id number] Date of Birth/Sex: 1929-08-14 (82 y.o. Male) Treating RN: Cornell Barman Primary Care Physician: Unk Pinto Other Clinician: Referring Physician: Unk Pinto Treating Physician/Extender: Benjaman Pott in Treatment: 2 Information Obtained from: Patient Chief Complaint Patient presents to the wound care center for a consult due non healing wound the right lateral ankle and medial foot which she's had for about a month. Electronic Signature(s) Signed: 08/14/2016 8:09:29 AM By: Judene Companion MD Entered By: Judene Companion on 08/14/2016 99:24:26 [name] ([id number]) -------------------------------------------------------------------------------- Debridement Details Patient Name: [name], [name]. Date of Service: 08/14/2016 8:00 AM Medical Record Number: [id number] Patient Account Number: [id number] Date of Birth/Sex: Oct 03, 1929 (82 y.o. Male) Treating RN: Baruch Gouty, RN, BSN, [place] Primary Care Physician: Unk Pinto Other Clinician: Referring Physician: Unk Pinto Treating Physician/Extender: Benjaman Pott in Treatment: 2 Debridement Performed for Wound #2 Right,Medial Toe Great Assessment: Performed By: Physician Judene Companion, MD Debridement: Debridement Pre-procedure Yes - 08:15 Verification/Time Out Taken: Start Time: 08:15 Pain Control: Lidocaine 4% Topical Solution Level: Skin/Subcutaneous Tissue Total Area Debrided (L x 1.7 (cm) x 1.5 (cm) = 2.55 (cm) W): Tissue and other Non-Viable, Exudate, Fibrin/Slough, Subcutaneous material debrided: Instrument: Curette Bleeding: Minimum Hemostasis Achieved: Pressure End Time: 08:20 Procedural Pain: 0 Post Procedural Pain: 0 Response to Treatment: Procedure was tolerated  well Post Debridement Measurements of Total Wound Length: (cm) 1.7 Stage: Category/Stage II Width: (cm) 1.5 Depth: (cm) 0.1 Volume: (cm) 0.2 Character of Wound/Ulcer Post Requires Further Debridement: Debridement Severity of Tissue Post Fat layer exposed Debridement: Post Procedure Diagnosis Same as Pre-procedure Electronic Signature(s) Signed: 08/14/2016 3:59:06 PM By: Judene Companion MD Signed: 08/14/2016 4:38:32 PM By: Regan Lemming BSN, RN Entered By: Regan Lemming on 08/14/2016 08:22:07 [name], [name] ([id number], [name] ([id number]) -------------------------------------------------------------------------------- Debridement Details Patient Name: [name], [name]. Date of Service: 08/14/2016 8:00 AM Medical Record Number: [id number] Patient Account Number: [id number] Date of Birth/Sex: [birth year] (82 y.o. Male) Treating RN: Baruch Gouty, RN, BSN, [place] Primary Care Physician: Unk Pinto Other Clinician: Referring Physician: Unk Pinto Treating Physician/Extender: Benjaman Pott in Treatment: 2 Debridement Performed for Wound #1 Right,Lateral Malleolus Assessment: Performed By: Physician Judene Companion, MD Debridement: Debridement Pre-procedure Yes - 08:15 Verification/Time Out Taken: Start Time: 08:15 Pain Control: Lidocaine 4% Topical Solution Level: Skin/Subcutaneous Tissue Total Area Debrided (L x 1.7 (cm) x 2 (cm) = 3.4 (cm) W): Tissue and other Non-Viable, Exudate, Fibrin/Slough, Subcutaneous material debrided: Instrument: Curette Bleeding: Minimum Hemostasis Achieved: Pressure End Time: 08:20 Procedural Pain: 0 Post Procedural Pain: 0 Response to Treatment: Procedure was tolerated well Post Debridement Measurements of Total Wound Length: (cm) 1.7 Width: (cm) 2 Depth: (cm) 0.2 Volume: (cm) 0.534 Character of Wound/Ulcer Post Requires Further Debridement Debridement: Severity of Tissue Post Debridement: Fat layer exposed Post  Procedure Diagnosis Same as Pre-procedure Electronic Signature(s) Signed: 08/14/2016 3:59:06 PM By: Judene Companion MD Signed: 08/14/2016 4:38:32 PM By: Regan Lemming BSN, RN Entered By: Regan Lemming on 08/14/2016 08:22:48 RICHRD, KUZNIAR ([id number], HAYHURST ([id number]) -------------------------------------------------------------------------------- HPI Details Patient Name: [name], [name]. Date of Service: 08/14/2016 8:00 AM Medical Record Number: [id number] Patient Account Number: [id number] Date of Birth/Sex: 31-May-1929 (82 y.o. Male) Treating RN: Cornell Barman Primary Care Physician: Unk Pinto Other Clinician: Referring Physician: Unk Pinto Treating Physician/Extender: Benjaman Pott in Treatment: 2 History of Present Illness Location: right  lateral ankle and medial foot Quality: Patient reports experiencing a dull pain to affected area(s). Severity: Patient states wound are getting worse. Duration: Patient has had the wound for < 4 weeks prior to presenting for treatment Timing: Pain in wound is Intermittent (comes and goes Context: The wound appeared gradually over time Modifying Factors: Other treatment(s) tried include:as been put on doxycycline and is taking local care with hydrogen peroxide Associated Signs and Symptoms: Patient reports having increase swelling. 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 his past medical history is significant for anemia, arthritis, renal cancer with nephrectomy done in June 2013, lung cancer, supposed neck surgery, appendectomy, low back surgery, cholecystectomy, coronary angioplasty, laparoscopic left nephrectomy. PCP recently started him on doxycycline 100 mg. He is not a smoker. 08/07/2016 -- arterial and venous studies are still pending and his x-rays  have not been done yet Electronic Signature(s) Signed: 08/14/2016 8:10:41 AM By: Judene Companion MD Entered By: Judene Companion on 08/14/2016 08:10:41 MICK, TANGUMA ([id number]) -------------------------------------------------------------------------------- Physical Exam Details Patient Name: [name], [name]. Date of Service: 08/14/2016 8:00 AM Medical Record Number: [id number] Patient Account Number: [id number] Date of Birth/Sex: 03/19/1929 (82 y.o. Male) Treating RN: Cornell Barman Primary Care Physician: Unk Pinto Other Clinician: Referring Physician: Unk Pinto Treating Physician/Extender: Benjaman Pott in Treatment: 2 Electronic Signature(s) Signed: 08/14/2016 8:10:50 AM By: Judene Companion MD Entered By: Judene Companion on 08/14/2016 08:10:50 YAVIER, SNIDER ([id number]) -------------------------------------------------------------------------------- Physician Orders Details Patient Name: [name], [name]. Date of Service: 08/14/2016 8:00 AM Medical Record Number: [id number] Patient Account Number: [id number] Date of Birth/Sex: [birth year] (82 y.o. Male) Treating RN: Afful, RN, BSN, [place] Primary Care Physician: Unk Pinto Other Clinician: Referring Physician: Unk Pinto Treating Physician/Extender: Benjaman Pott in Treatment: 2 Verbal / Phone Orders: Yes Clinician: Afful, RN, BSN, Rita Read Back and Verified: Yes Diagnosis Coding ICD-10 Coding Code Description [phone] Non-pressure chronic ulcer of right ankle with fat layer exposed L97.512 Non-pressure chronic ulcer of other part of right foot with fat layer exposed I73.9 Peripheral vascular disease, unspecified M21.611 Bunion of right foot I89.0 Lymphedema, not elsewhere classified E66.9 Obesity, unspecified Wound Cleansing Wound #1 Right,Lateral Malleolus o Cleanse wound with mild soap and water o May Shower, gently pat wound dry prior to applying new dressing. Wound #2 Right,Medial  Toe Great o Cleanse wound with mild soap and water o May Shower, gently pat wound dry prior to applying new dressing. Anesthetic Wound #1 Right,Lateral Malleolus o Topical Lidocaine 4% cream applied to wound bed prior to debridement Wound #2 Right,Medial Toe Great o Topical Lidocaine 4% cream applied to wound bed prior to debridement Skin Barriers/Peri-Wound Care Wound #1 Right,Lateral Malleolus o Barrier cream Wound #2 Right,Medial Toe Great o Barrier cream Primary Wound Dressing Wound #1 Right,Lateral Malleolus o [place] Ag [name], [name] ([id number]) Wound #2 Right,Medial Toe Great o [place] Ag Secondary Dressing Wound #1 Right,Lateral Malleolus o Dry Gauze o Boardered Foam Dressing Wound #2 Right,Medial Toe Great o Dry Gauze o Boardered Foam Dressing Dressing Change Frequency Wound #1 Right,Lateral Malleolus o Change dressing every day. Wound #2 Right,Medial Toe Great o Change dressing every other day. Follow-up Appointments Wound #1 Right,Lateral Malleolus o Return Appointment in 1 week. Wound #2 Right,Medial Toe Great o Return Appointment in 1 week. Off-Loading Wound #1 Right,Lateral Malleolus o Open toe surgical shoe with peg assist. Wound #2 Right,Medial Toe Great o  Open toe surgical shoe with peg assist. Additional Orders / Instructions Wound #1 Right,Lateral Malleolus o Increase protein intake. o Activity as tolerated o Other: - MVI, Vit A, C, Zinc Wound #2 Right,Medial Toe Great o Increase protein intake. o Activity as tolerated o Other: - MVI, Vit A, C, Zinc [name], [name] ([id number]) Electronic Signature(s) Signed: 08/14/2016 3:59:06 PM By: Judene Companion MD Signed: 08/14/2016 4:38:32 PM By: Regan Lemming BSN, RN Entered By: Regan Lemming on 08/14/2016 08:24:57 [name] ([id number]) -------------------------------------------------------------------------------- Problem List  Details Patient Name: [name], [name]. Date of Service: 08/14/2016 8:00 AM Medical Record Number: [id number] Patient Account Number: [id number] Date of Birth/Sex: 07/16/1929 (82 y.o. Male) Treating RN: Cornell Barman Primary Care Physician: Unk Pinto Other Clinician: Referring Physician: Unk Pinto Treating Physician/Extender: Benjaman Pott in Treatment: 2 Active Problems ICD-10 Encounter Code Description Active Date Diagnosis [phone] Non-pressure chronic ulcer of right ankle with fat layer 07/31/2016 Yes exposed L97.512 Non-pressure chronic ulcer of other part of right foot with 07/31/2016 Yes fat layer exposed I73.9 Peripheral vascular disease, unspecified 07/31/2016 Yes M21.611 Bunion of right foot 07/31/2016 Yes I89.0 Lymphedema, not elsewhere classified 07/31/2016 Yes E66.9 Obesity, unspecified 07/31/2016 Yes Inactive Problems Resolved Problems Electronic Signature(s) Signed: 08/14/2016 8:09:02 AM By: Judene Companion MD Entered By: Judene Companion on 08/14/2016 08:09:02 [name] ([id number]) -------------------------------------------------------------------------------- Progress Note Details Patient Name: [name] Date of Service: 08/14/2016 8:00 AM Medical Record Number: [id number] Patient Account Number: [id number] Date of Birth/Sex: [birth year] (82 y.o. Male) Treating RN: Primary Care Physician: Unk Pinto Other Clinician: Referring Physician: Unk Pinto Treating Physician/Extender: Suella [place] in Treatment: 2 Subjective Chief Complaint Information obtained from Patient Patient presents to the wound care center for a consult due non healing wound the right lateral ankle and medial foot which she's had for about a month. History of Present Illness (HPI) The following HPI elements were documented for the patient's wound: Location: right lateral ankle and medial foot Quality: Patient reports experiencing a dull pain to affected  area(s). Severity: Patient states wound are getting worse. Duration: Patient has had the wound for < 4 weeks prior to presenting for treatment Timing: Pain in wound is Intermittent (comes and goes Context: The wound appeared gradually over time Modifying Factors: Other treatment(s) tried include:as been put on doxycycline and is taking local care with hydrogen peroxide Associated Signs and Symptoms: Patient reports having increase swelling. 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 his past medical history is significant for anemia, arthritis, renal cancer with nephrectomy done in June 2013, lung cancer, supposed neck surgery, appendectomy, low back surgery, cholecystectomy, coronary angioplasty, laparoscopic left nephrectomy. PCP recently started him on doxycycline 100 mg. He is not a smoker. 08/07/2016 -- arterial and venous studies are still pending and his x-rays have not been done yet Objective Constitutional Vitals Time Taken: 8:09 AM, Height: 69 in, Weight: 259 lbs, BMI: 38.2, Temperature: 97.5 F, Pulse: 83 bpm, Respiratory Rate: 17 breaths/min, Blood Pressure: 154/65 mmHg. EYAL, GREENHAW ([id number]) Assessment Active Problems ICD-10 [phone] - Non-pressure chronic ulcer of right ankle with fat layer exposed L97.512 - Non-pressure chronic ulcer of other part of right foot with fat layer exposed I73.9 - Peripheral vascular disease, unspecified M21.611 - Bunion of right foot I89.0 - Lymphedema, not elsewhere classified E66.9 - Obesity, unspecified Patient has ulcers right foot being Rx'ed with aquacel daily. Electronic Signature(s) Signed: 08/14/2016 8:39:52 AM  By: Judene Companion MD Entered By: Judene Companion on 08/14/2016 08:39:52 [name]  ([id number]) -------------------------------------------------------------------------------- SuperBill Details Patient Name: [name] Date of Service: 08/14/2016 Medical Record Number: [id number] Patient Account Number: [id number] Date of Birth/Sex: 01/10/1929 (82 y.o. Male) Treating RN: Baruch Gouty, RN, BSN, [place] Primary Care Physician: Unk Pinto Other Clinician: Referring Physician: Unk Pinto Treating Physician/Extender: Benjaman Pott in Treatment: 2 Diagnosis Coding ICD-10 Codes Code Description [phone] Non-pressure chronic ulcer of right ankle with fat layer exposed L97.512 Non-pressure chronic ulcer of other part of right foot with fat layer exposed I73.9 Peripheral vascular disease, unspecified M21.611 Bunion of right foot I89.0 Lymphedema, not elsewhere classified E66.9 Obesity, unspecified Facility Procedures CPT4 Code Description: [id number] 11042 - DEB SUBQ TISSUE 20 SQ CM/< ICD-10 Description Diagnosis L97.512 Non-pressure chronic ulcer of other part of right fo Modifier: ot with fat lay Quantity: 1 er exposed Physician Procedures CPT4 Code Description: [id number] - WC PHYS LEVEL 2 - EST PT ICD-10 Description Diagnosis L97.312 Non-pressure chronic ulcer of right ankle with fat l Modifier: ayer exposed Quantity: 1 CPT4 Code Description: [id number] 11042 - WC PHYS SUBQ TISS 20 SQ CM ICD-10 Description Diagnosis L97.512 Non-pressure chronic ulcer of other part of right fo Modifier: ot with fat lay Quantity: 1 er exposed Electronic Signature(s) Signed: 08/14/2016 8:40:53 AM By: Judene Companion MD Entered By: Judene Companion on 08/14/2016 08:40:52

## 2016-08-15 NOTE — Progress Notes (Signed)
RYDGE, TEXIDOR ([id number]) Visit Report for 08/14/2016 Arrival Information Details Patient Name: [name], [name]. Date of Service: 08/14/2016 8:00 AM Medical Record Number: [id number] Patient Account Number: [id number] Date of Birth/Sex: Oct 10, 1929 (82 y.o. Male) Treating RN: Baruch Gouty, RN, BSN, Velva Harman Primary Care Physician: Unk Pinto Other Clinician: Referring Physician: Unk Pinto Treating Physician/Extender: Benjaman Pott in Treatment: 2 Visit Information History Since Last Visit All ordered tests and consults were completed: No Patient Arrived: Wheel Chair Added or deleted any medications: No Arrival Time: 08:05 Any new allergies or adverse reactions: No Accompanied By: dtr Had a fall or experienced change in No Transfer Assistance: None activities of daily living that may affect Patient Identification Verified: Yes risk of falls: Secondary Verification Process Yes Signs or symptoms of abuse/neglect since last No Completed: visito Patient Requires Transmission- No Hospitalized since last visit: No Based Precautions: Has Dressing in Place as Prescribed: Yes Patient Has Alerts: Yes Pain Present Now: No Patient Alerts: Patient on Blood Thinner ASA Electronic Signature(s) Signed: 08/14/2016 4:38:32 PM By: Regan Lemming BSN, RN Entered By: Regan Lemming on 08/14/2016 08:06:23 [name] ([id number]) -------------------------------------------------------------------------------- Encounter Discharge Information Details Patient Name: [name], [name]. Date of Service: 08/14/2016 8:00 AM Medical Record Number: [id number] Patient Account Number: [id number] Date of Birth/Sex: [birth year] (82 y.o. Male) Treating RN: Baruch Gouty, RN, BSN, Velva Harman Primary Care Physician: Unk Pinto Other Clinician: Referring Physician: Unk Pinto Treating Physician/Extender: Benjaman Pott in Treatment: 2 Encounter Discharge Information Items Discharge Pain Level:  0 Discharge Condition: Stable Ambulatory Status: Wheelchair Discharge Destination: Home Transportation: Private Auto Accompanied By: dtr Schedule Follow-up Appointment: No Medication Reconciliation completed and provided to Patient/Care No Donovin Kraemer: Provided on Clinical Summary of Care: 08/14/2016 Form Type Recipient Paper Patient TC Electronic Signature(s) Signed: 08/14/2016 8:41:38 AM By: Judene Companion MD Previous Signature: 08/14/2016 8:37:20 AM Version By: Ruthine Dose Entered By: Judene Companion on 08/14/2016 08:41:38 [name] ([id number]) -------------------------------------------------------------------------------- Lower Extremity Assessment Details Patient Name: [name], [name]. Date of Service: 08/14/2016 8:00 AM Medical Record Number: [id number] Patient Account Number: [id number] Date of Birth/Sex: 01/25/1929 (82 y.o. Male) Treating RN: Baruch Gouty, RN, BSN, Velva Harman Primary Care Physician: Unk Pinto Other Clinician: Referring Physician: Unk Pinto Treating Physician/Extender: Benjaman Pott in Treatment: 2 Vascular Assessment Pulses: Posterior Tibial Dorsalis Pedis Palpable: [Right:Yes] Extremity colors, hair growth, and conditions: Extremity Color: [Right:Mottled] Hair Growth on Extremity: [Right:No] Temperature of Extremity: [Right:Warm] Capillary Refill: [Right:< 3 seconds] Toe Nail Assessment Left: Right: Thick: Yes Discolored: Yes Deformed: No Improper Length and Hygiene: No Electronic Signature(s) Signed: 08/14/2016 4:38:32 PM By: Regan Lemming BSN, RN Entered By: Regan Lemming on 08/14/2016 08:06:47 [name] ([id number]) -------------------------------------------------------------------------------- Multi Wound Chart Details Patient Name: [name] Date of Service: 08/14/2016 8:00 AM Medical Record Number: [id number] Patient Account Number: [id number] Date of Birth/Sex: December 29, 1928 (82 y.o. Male) Treating RN: Baruch Gouty, RN,  BSN, Velva Harman Primary Care Physician: Unk Pinto Other Clinician: Referring Physician: Unk Pinto Treating Physician/Extender: Benjaman Pott in Treatment: 2 Vital Signs Height(in): 69 Pulse(bpm): 83 Weight(lbs): 259 Blood Pressure 154/65 (mmHg): Body Mass Index(BMI): 38 Temperature(F): 97.5 Respiratory Rate 17 (breaths/min): Photos: [1:No Photos] [2:No Photos] [N/A:N/A] Wound Location: [1:Right Malleolus - Lateral] [2:Right Toe Great - Medial] [N/A:N/A] Wounding Event: [1:Gradually Appeared] [2:Gradually Appeared] [N/A:N/A] Primary Etiology: [1:Venous Leg Ulcer] [2:Pressure Ulcer] [N/A:N/A] Comorbid History: [1:Cataracts, Arrhythmia, Coronary Artery Disease, Hypertension, Gout, Osteoarthritis] [2:Cataracts, Arrhythmia, Coronary Artery Disease, Hypertension, Gout, Osteoarthritis] [N/A:N/A] Date Acquired: [1:06/30/2016] [2:07/03/2016] [N/A:N/A] Weeks of Treatment: [1:2] [2:2] [N/A:N/A] Wound Status: [1:Open] [  2:Open] [N/A:N/A] Measurements L x W x D 1.7x2x0.2 [2:1.7x1.5x0.1] [N/A:N/A] (cm) Area (cm) : [1:2.67] [2:2.003] [N/A:N/A] Volume (cm) : [1:0.534] [2:0.2] [N/A:N/A] % Reduction in Area: [1:32.00%] [2:-921.90%] [N/A:N/A] % Reduction in Volume: 32.00% [2:-900.00%] [N/A:N/A] Classification: [1:Full Thickness Without Exposed Support Structures] [2:Category/Stage II] [N/A:N/A] Exudate Amount: [1:Medium] [2:Medium] [N/A:N/A] Exudate Type: [1:Serosanguineous] [2:Serosanguineous] [N/A:N/A] Exudate Color: [1:red, brown] [2:red, brown] [N/A:N/A] Foul Odor After [1:Yes] [2:No] [N/A:N/A] Cleansing: Odor Anticipated Due to No [2:N/A] [N/A:N/A] Product Use: Wound Margin: [1:Indistinct, nonvisible] [2:Distinct, outline attached N/A] Granulation Amount: [1:Small (1-33%)] [2:Medium (34-66%)] [N/A:N/A] Granulation Quality: [1:Pink, Pale] [2:Pink, Pale] [N/A:N/A] Necrotic Amount: Large (67-100%) Medium (34-66%) N/A Exposed Structures: Fascia: No Fascia: No N/A Fat:  No Fat: No Tendon: No Tendon: No Muscle: No Muscle: No Joint: No Joint: No Bone: No Bone: No Limited to Skin Limited to Skin Breakdown Breakdown Epithelialization: None None N/A Periwound Skin Texture: Edema: Yes Edema: No N/A Excoriation: No Excoriation: No Induration: No Induration: No Callus: No Callus: No Crepitus: No Crepitus: No Fluctuance: No Fluctuance: No Friable: No Friable: No Rash: No Rash: No Scarring: No Scarring: No Periwound Skin Maceration: Yes Maceration: Yes N/A Moisture: Moist: Yes Moist: Yes Dry/Scaly: No Dry/Scaly: No Periwound Skin Color: Atrophie Blanche: No Atrophie Blanche: No N/A Cyanosis: No Cyanosis: No Ecchymosis: No Ecchymosis: No Erythema: No Erythema: No Hemosiderin Staining: No Hemosiderin Staining: No Mottled: No Mottled: No Pallor: No Pallor: No Rubor: No Rubor: No Temperature: No Abnormality No Abnormality N/A Tenderness on Yes Yes N/A Palpation: Wound Preparation: Ulcer Cleansing: Ulcer Cleansing: N/A Rinsed/Irrigated with Rinsed/Irrigated with Saline Saline Topical Anesthetic Topical Anesthetic Applied: Other: lidocaine Applied: Other: lidocaine 4% 4% Treatment Notes Electronic Signature(s) Signed: 08/14/2016 4:38:32 PM By: Regan Lemming BSN, RN Entered By: Regan Lemming on 08/14/2016 08:19:51 [name] ([id number]) -------------------------------------------------------------------------------- [place] Details Patient Name: [name], [name]. Date of Service: 08/14/2016 8:00 AM Medical Record Number: [id number] Patient Account Number: [id number] Date of Birth/Sex: 11-Jun-1929 (82 y.o. Male) Treating RN: Baruch Gouty, RN, BSN, Velva Harman Primary Care Physician: Unk Pinto Other Clinician: Referring Physician: Unk Pinto Treating Physician/Extender: Benjaman Pott in Treatment: 2 Active Inactive Orientation to the Wound Care Program Nursing Diagnoses: Knowledge deficit  related to the wound healing center program Goals: Patient/caregiver will verbalize understanding of the [facility] Date Initiated: 07/31/2016 Goal Status: Active Interventions: Provide education on orientation to the wound center Notes: Venous Leg Ulcer Nursing Diagnoses: Knowledge deficit related to disease process and management Potential for venous Insuffiency (use before diagnosis confirmed) Goals: Non-invasive venous studies are completed as ordered Date Initiated: 07/31/2016 Goal Status: Active Patient will maintain optimal edema control Date Initiated: 07/31/2016 Goal Status: Active Patient/caregiver will verbalize understanding of disease process and disease management Date Initiated: 07/31/2016 Goal Status: Active Verify adequate tissue perfusion prior to therapeutic compression application Date Initiated: 07/31/2016 Goal Status: Active Interventions: Assess peripheral edema status every visit. TERON, BLAIS ([id number]) Compression as ordered Provide education on venous insufficiency Treatment Activities: Therapeutic compression applied : 07/31/2016 Notes: Wound/Skin Impairment Nursing Diagnoses: Impaired tissue integrity Knowledge deficit related to ulceration/compromised skin integrity Goals: Patient/caregiver will verbalize understanding of skin care regimen Date Initiated: 07/31/2016 Goal Status: Active Ulcer/skin breakdown will have a volume reduction of 30% by week 4 Date Initiated: 07/31/2016 Goal Status: Active Ulcer/skin breakdown will have a volume reduction of 50% by week 8 Date Initiated: 07/31/2016 Goal Status: Active Ulcer/skin breakdown will have a volume reduction of 80% by week 12 Date Initiated: 07/31/2016 Goal Status: Active Ulcer/skin breakdown  will heal within 14 weeks Date Initiated: 07/31/2016 Goal Status: Active Interventions: Assess patient/caregiver ability to obtain necessary supplies Assess patient/caregiver  ability to perform ulcer/skin care regimen upon admission and as needed Assess ulceration(s) every visit Provide education on ulcer and skin care Treatment Activities: Referred to DME Sadiq Mccauley for dressing supplies : 07/31/2016 Skin care regimen initiated : 07/31/2016 Topical wound management initiated : 07/31/2016 Notes: Electronic Signature(s) CASHMERE, HARMES ([id number]) Signed: 08/14/2016 4:38:32 PM By: Regan Lemming BSN, RN Entered By: Regan Lemming on 08/14/2016 08:19:28 [name] ([id number]) -------------------------------------------------------------------------------- Pain Assessment Details Patient Name: [name], [name]. Date of Service: 08/14/2016 8:00 AM Medical Record Number: [id number] Patient Account Number: [id number] Date of Birth/Sex: 01/07/1929 (82 y.o. Male) Treating RN: Baruch Gouty, RN, BSN, Velva Harman Primary Care Physician: Unk Pinto Other Clinician: Referring Physician: Unk Pinto Treating Physician/Extender: Benjaman Pott in Treatment: 2 Active Problems Location of Pain Severity and Description of Pain Patient Has Paino No Site Locations With Dressing Change: No Pain Management and Medication Current Pain Management: Electronic Signature(s) Signed: 08/14/2016 4:38:32 PM By: Regan Lemming BSN, RN Entered By: Regan Lemming on 08/14/2016 08:06:30 [name] ([id number]) -------------------------------------------------------------------------------- Patient/Caregiver Education Details Patient Name: [name], [name]. Date of Service: 08/14/2016 8:00 AM Medical Record Number: [id number] Patient Account Number: [id number] Date of Birth/Gender: [birth year] (82 y.o. Male) Treating RN: Baruch Gouty, RN, BSN, Velva Harman Primary Care Physician: Unk Pinto Other Clinician: Referring Physician: Unk Pinto Treating Physician/Extender: Benjaman Pott in Treatment: 2 Education Assessment Education Provided To: Patient Education Topics  Provided Venous: Methods: Explain/Verbal Responses: State content correctly Welcome To [facility] [place]: Methods: Explain/Verbal Responses: State content correctly Wound/Skin Impairment: Methods: Explain/Verbal Responses: State content correctly Electronic Signature(s) Signed: 08/14/2016 3:59:06 PM By: Judene Companion MD Entered By: Judene Companion on 08/14/2016 08:41:51 [name], [name] ([id number]) -------------------------------------------------------------------------------- Wound Assessment Details Patient Name: [name], [name]. Date of Service: 08/14/2016 8:00 AM Medical Record Number: [id number] Patient Account Number: [id number] Date of Birth/Sex: [birth year] (82 y.o. Male) Treating RN: Baruch Gouty, RN, BSN, [place] Primary Care Physician: Unk Pinto Other Clinician: Referring Physician: Unk Pinto Treating Physician/Extender: Benjaman Pott in Treatment: 2 Wound Status Wound Number: 1 Primary Venous Leg Ulcer Etiology: Wound Location: Right Malleolus - Lateral Wound Open Wounding Event: Gradually Appeared Status: Date Acquired: 06/30/2016 Comorbid Cataracts, Arrhythmia, Coronary Artery Weeks Of Treatment: 2 History: Disease, Hypertension, Gout, Clustered Wound: No Osteoarthritis Wound Measurements Length: (cm) 1.7 Width: (cm) 2 Depth: (cm) 0.2 Area: (cm) 2.67 Volume: (cm) 0.534 % Reduction in Area: 32% % Reduction in Volume: 32% Epithelialization: None Tunneling: No Undermining: No Wound Description Full Thickness Without Exposed Classification: Support Structures Wound Margin: Indistinct, nonvisible Exudate Medium Amount: Exudate Type: Serosanguineous Exudate Color: red, brown Foul Odor After Cleansing: Yes Due to Product Use: No Wound Bed Granulation Amount: Small (1-33%) Exposed Structure Granulation Quality: Pink, Pale Fascia Exposed: No Necrotic Amount: Large (67-100%) Fat Layer Exposed: No Necrotic Quality: Adherent  Slough Tendon Exposed: No Muscle Exposed: No Joint Exposed: No Bone Exposed: No Limited to Skin Breakdown Periwound Skin Texture Texture Color No Abnormalities Noted: No No Abnormalities Noted: No Callus: No Atrophie Blanche: No [name], [name] ([id number]) Crepitus: No Cyanosis: No Excoriation: No Ecchymosis: No Fluctuance: No Erythema: No Friable: No Hemosiderin Staining: No Induration: No Mottled: No Localized Edema: Yes Pallor: No Rash: No Rubor: No Scarring: No Temperature / Pain Moisture Temperature: No Abnormality No Abnormalities Noted: No Tenderness on Palpation: Yes Dry / Scaly: No Maceration: Yes Moist: Yes Wound Preparation Ulcer Cleansing:  Rinsed/Irrigated with Saline Topical Anesthetic Applied: Other: lidocaine 4%, Treatment Notes Wound #1 (Right, Lateral Malleolus) 1. Cleansed with: Clean wound with Normal Saline 4. Dressing Applied: Aquacel Ag 5. Secondary Dressing Applied Bordered Foam Dressing Electronic Signature(s) Signed: 08/14/2016 4:38:32 PM By: Regan Lemming BSN, RN Entered By: Regan Lemming on 08/14/2016 08:13:11 [name], [name] ([id number]) -------------------------------------------------------------------------------- Wound Assessment Details Patient Name: [name], [name]. Date of Service: 08/14/2016 8:00 AM Medical Record Number: [id number] Patient Account Number: [id number] Date of Birth/Sex: August 10, 1929 (82 y.o. Male) Treating RN: Baruch Gouty, RN, BSN, [facility] Primary Care Physician: Unk Pinto Other Clinician: Referring Physician: Unk Pinto Treating Physician/Extender: Benjaman Pott in Treatment: 2 Wound Status Wound Number: 2 Primary Pressure Ulcer Etiology: Wound Location: Right Toe Great - Medial Wound Open Wounding Event: Gradually Appeared Status: Date Acquired: 07/03/2016 Comorbid Cataracts, Arrhythmia, Coronary Artery Weeks Of Treatment: 2 History: Disease, Hypertension, Gout, Clustered Wound:  No Osteoarthritis Wound Measurements Length: (cm) 1.7 Width: (cm) 1.5 Depth: (cm) 0.1 Area: (cm) 2.003 Volume: (cm) 0.2 % Reduction in Area: -921.9% % Reduction in Volume: -900% Epithelialization: None Tunneling: No Undermining: No Wound Description Classification: Category/Stage II Wound Margin: Distinct, outline attached Exudate Amount: Medium Exudate Type: Serosanguineous Exudate Color: red, brown Foul Odor After Cleansing: No Wound Bed Granulation Amount: Medium (34-66%) Exposed Structure Granulation Quality: Pink, Pale Fascia Exposed: No Necrotic Amount: Medium (34-66%) Fat Layer Exposed: No Necrotic Quality: Adherent Slough Tendon Exposed: No Muscle Exposed: No Joint Exposed: No Bone Exposed: No Limited to Skin Breakdown Periwound Skin Texture Texture Color No Abnormalities Noted: No No Abnormalities Noted: No Callus: No Atrophie Blanche: No Crepitus: No Cyanosis: No Excoriation: No Ecchymosis: No [name], [name] ([id number]) Fluctuance: No Erythema: No Friable: No Hemosiderin Staining: No Induration: No Mottled: No Localized Edema: No Pallor: No Rash: No Rubor: No Scarring: No Temperature / Pain Moisture Temperature: No Abnormality No Abnormalities Noted: No Tenderness on Palpation: Yes Dry / Scaly: No Maceration: Yes Moist: Yes Wound Preparation Ulcer Cleansing: Rinsed/Irrigated with Saline Topical Anesthetic Applied: Other: lidocaine 4%, Treatment Notes Wound #2 (Right, Medial Toe Great) 1. Cleansed with: Clean wound with Normal Saline 4. Dressing Applied: Aquacel Ag 5. Secondary Dressing Applied Bordered Foam Dressing Electronic Signature(s) Signed: 08/14/2016 4:38:32 PM By: Regan Lemming BSN, RN Entered By: Regan Lemming on 08/14/2016 08:13:27 [name] ([id number]) -------------------------------------------------------------------------------- Vitals Details Patient Name: [name] Date of Service: 08/14/2016  8:00 AM Medical Record Number: [id number] Patient Account Number: [id number] Date of Birth/Sex: [birth year] (82 y.o. Male) Treating RN: Baruch Gouty, RN, BSN, [place] Primary Care Physician: Unk Pinto Other Clinician: Referring Physician: Unk Pinto Treating Physician/Extender: Benjaman Pott in Treatment: 2 Vital Signs Time Taken: 08:09 Temperature (F): 97.5 Height (in): 69 Pulse (bpm): 83 Weight (lbs): 259 Respiratory Rate (breaths/min): 17 Body Mass Index (BMI): 38.2 Blood Pressure (mmHg): 154/65 Reference Range: 80 - 120 mg / dl Electronic Signature(s) Signed: 08/14/2016 4:38:32 PM By: Regan Lemming BSN, RN Entered By: Regan Lemming on 08/14/2016 08:09:59

## 2016-08-18 ENCOUNTER — Ambulatory Visit (HOSPITAL_COMMUNITY): Admission: RE | Admit: 2016-08-18 | Payer: Medicare Other | Source: Ambulatory Visit

## 2016-08-18 ENCOUNTER — Encounter (HOSPITAL_COMMUNITY): Payer: Self-pay

## 2016-08-18 ENCOUNTER — Other Ambulatory Visit: Payer: Self-pay | Admitting: Surgery

## 2016-08-18 ENCOUNTER — Ambulatory Visit (HOSPITAL_COMMUNITY)
Admission: RE | Admit: 2016-08-18 | Discharge: 2016-08-18 | Disposition: A | Discharge status: Home / Self Care | Payer: Medicare Other | Source: Ambulatory Visit | Attending: Vascular Surgery | Admitting: Vascular Surgery

## 2016-08-18 DIAGNOSIS — X58XXXA Exposure to other specified factors, initial encounter: Secondary | ICD-10-CM | POA: Diagnosis not present

## 2016-08-18 DIAGNOSIS — S81801A Unspecified open wound, right lower leg, initial encounter: Secondary | ICD-10-CM

## 2016-08-18 DIAGNOSIS — I739 Peripheral vascular disease, unspecified: Secondary | ICD-10-CM

## 2016-08-18 DIAGNOSIS — R9439 Abnormal result of other cardiovascular function study: Secondary | ICD-10-CM | POA: Diagnosis not present

## 2016-08-19 ENCOUNTER — Other Ambulatory Visit: Payer: Self-pay | Admitting: *Deleted

## 2016-08-19 DIAGNOSIS — I2510 Atherosclerotic heart disease of native coronary artery without angina pectoris: Secondary | ICD-10-CM

## 2016-08-19 DIAGNOSIS — I10 Essential (primary) hypertension: Secondary | ICD-10-CM

## 2016-08-19 MED ORDER — AMLODIPINE BESYLATE 5 MG PO TABS: 5.0000 mg | ORAL_TABLET | Freq: Every day | ORAL | 3 refills | Status: DC

## 2016-08-21 ENCOUNTER — Ambulatory Visit (INDEPENDENT_AMBULATORY_CARE_PROVIDER_SITE_OTHER): Payer: Medicare Other | Admitting: Podiatry

## 2016-08-21 VITALS — BP 177/89 | HR 77 | Resp 16

## 2016-08-21 DIAGNOSIS — L97312 Non-pressure chronic ulcer of right ankle with fat layer exposed: Secondary | ICD-10-CM

## 2016-08-21 DIAGNOSIS — L97502 Non-pressure chronic ulcer of other part of unspecified foot with fat layer exposed: Secondary | ICD-10-CM | POA: Diagnosis not present

## 2016-08-21 DIAGNOSIS — L97519 Non-pressure chronic ulcer of other part of right foot with unspecified severity: Principal | ICD-10-CM

## 2016-08-21 DIAGNOSIS — I83015 Varicose veins of right lower extremity with ulcer other part of foot: Secondary | ICD-10-CM

## 2016-08-21 DIAGNOSIS — I83005 Varicose veins of unspecified lower extremity with ulcer other part of foot: Secondary | ICD-10-CM

## 2016-08-21 NOTE — Progress Notes (Signed)
   Subjective:    Patient ID: [name], male    DOB: 03/15/1929, 82 y.o.   MRN: [id number]  HPI    Review of Systems  HENT: Positive for hearing loss.   All other systems reviewed and are negative.      Objective:   Physical Exam        Assessment & Plan:

## 2016-08-21 NOTE — Patient Instructions (Signed)
Diabetes and Foot Care Diabetes may cause you to have problems because of poor blood supply (circulation) to your feet and legs. This may cause the skin on your feet to become thinner, break easier, and heal more slowly. Your skin may become dry, and the skin may peel and crack. You may also have nerve damage in your legs and feet causing decreased feeling in them. You may not notice minor injuries to your feet that could lead to infections or more serious problems. Taking care of your feet is one of the most important things you can do for yourself.  HOME CARE INSTRUCTIONS  Wear shoes at all times, even in the house. Do not go barefoot. Bare feet are easily injured.  Check your feet daily for blisters, cuts, and redness. If you cannot see the bottom of your feet, use a mirror or ask someone for help.  Wash your feet with warm water (do not use hot water) and mild soap. Then pat your feet and the areas between your toes until they are completely dry. Do not soak your feet as this can dry your skin.  Apply a moisturizing lotion or petroleum jelly (that does not contain alcohol and is unscented) to the skin on your feet and to dry, brittle toenails. Do not apply lotion between your toes.  Trim your toenails straight across. Do not dig under them or around the cuticle. File the edges of your nails with an emery board or nail file.  Do not cut corns or calluses or try to remove them with medicine.  Wear clean socks or stockings every day. Make sure they are not too tight. Do not wear knee-high stockings since they may decrease blood flow to your legs.  Wear shoes that fit properly and have enough cushioning. To break in new shoes, wear them for just a few hours a day. This prevents you from injuring your feet. Always look in your shoes before you put them on to be sure there are no objects inside.  Do not cross your legs. This may decrease the blood flow to your feet.  If you find a minor scrape,  cut, or break in the skin on your feet, keep it and the skin around it clean and dry. These areas may be cleansed with mild soap and water. Do not cleanse the area with peroxide, alcohol, or iodine.  When you remove an adhesive bandage, be sure not to damage the skin around it.  If you have a wound, look at it several times a day to make sure it is healing.  Do not use heating pads or hot water bottles. They may burn your skin. If you have lost feeling in your feet or legs, you may not know it is happening until it is too late.  Make sure your health care provider performs a complete foot exam at least annually or more often if you have foot problems. Report any cuts, sores, or bruises to your health care provider immediately. SEEK MEDICAL CARE IF:   You have an injury that is not healing.  You have cuts or breaks in the skin.  You have an ingrown nail.  You notice redness on your legs or feet.  You feel burning or tingling in your legs or feet.  You have pain or cramps in your legs and feet.  Your legs or feet are numb.  Your feet always feel cold. SEEK IMMEDIATE MEDICAL CARE IF:   There is increasing redness,   swelling, or pain in or around a wound.  There is a red line that goes up your leg.  Pus is coming from a wound.  You develop a fever or as directed by your health care provider.  You notice a bad smell coming from an ulcer or wound.   This information is not intended to replace advice given to you by your health care provider. Make sure you discuss any questions you have with your health care provider.   Document Released: 10/24/2000 Document Revised: 06/29/2013 Document Reviewed: 04/05/2013 [employer] Interactive Patient Education 2016 [employer]  

## 2016-08-22 ENCOUNTER — Encounter: Payer: Self-pay | Admitting: Internal Medicine

## 2016-08-22 ENCOUNTER — Other Ambulatory Visit: Payer: Self-pay | Admitting: *Deleted

## 2016-08-22 ENCOUNTER — Encounter: Payer: Medicare Other | Admitting: Nurse Practitioner

## 2016-08-22 DIAGNOSIS — L97312 Non-pressure chronic ulcer of right ankle with fat layer exposed: Secondary | ICD-10-CM | POA: Diagnosis not present

## 2016-08-22 MED ORDER — DICLOFENAC SODIUM 1 % TD GEL: 4.0000 g | Freq: Four times a day (QID) | TRANSDERMAL | 3 refills | Status: DC

## 2016-08-23 NOTE — Progress Notes (Signed)
QUADARIUS, HENTON ([id number]) Visit Report for 08/22/2016 Chief Complaint Document Details Patient Name: [name], [name]. Date of Service: 08/22/2016 8:00 AM Medical Record Number: [id number] Patient Account Number: [id number] Date of Birth/Sex: [birth year] (82 y.o. Male) Treating RN: Baruch Gouty, RN, BSN, Velva Harman Primary Care Physician: Unk Pinto Other Clinician: Referring Physician: Unk Pinto Treating Physician/Extender: Loistine Chance in Treatment: 3 Information Obtained from: Patient Chief Complaint Patient presents to the wound care center for a consult due non healing wound the right lateral ankle and medial foot which she's had for about a month. Electronic Signature(s) Signed: 08/22/2016 5:31:30 PM By: Londell Moh FNP Entered By: Londell Moh on 08/22/2016 08:39:33 LEMAN, MARTINEK ([id number]) -------------------------------------------------------------------------------- HPI Details Patient Name: [name]. Date of Service: 08/22/2016 8:00 AM Medical Record Number: [id number] Patient Account Number: [id number] Date of Birth/Sex: [birth year] (82 y.o. Male) Treating RN: Baruch Gouty, RN, BSN, Velva Harman Primary Care Physician: Unk Pinto Other Clinician: Referring Physician: Unk Pinto Treating Physician/Extender: Loistine Chance in Treatment: 3 History of Present Illness Location: right lateral ankle and medial foot Quality: Patient reports experiencing a dull pain to affected area(s). Severity: Patient states wound are getting worse. Duration: Patient has had the wound for < 4 weeks prior to presenting for treatment Timing: Pain in wound is Intermittent (comes and goes Context: The wound appeared gradually over time Modifying Factors: Other treatment(s) tried include:as been put on doxycycline and is taking local care with hydrogen peroxide Associated Signs and Symptoms: Patient reports having increase swelling. 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 his past medical history is significant for anemia, arthritis, renal cancer with nephrectomy done in June 2013, lung cancer, supposed neck surgery, appendectomy, low back surgery, cholecystectomy, coronary angioplasty, laparoscopic left nephrectomy. PCP recently started him on doxycycline 100 mg. He is not a smoker. 08/07/2016 -- arterial and venous studies are still pending and his x-rays have not been done yet 08/22/16: pt returns today, with his daughter, for f/u. he followed with podiatry yesterday who is contemplating possible shaving of the 1st metatarsal bone once wound has healed for prevention of further issues. I reviewed xray findings from 07/31/16 that shows no acute findings. no evidence for osteo. arterial and venous studies are still pending. his wounds have improved from prior visit. he continues to have intermittent pain, but not significant and has not progressed. no systemic s/s of infection. Electronic Signature(s) Signed: 08/22/2016 5:31:30 PM By: Londell Moh FNP Entered By: Londell Moh on 08/22/2016 08:43:09 BURNIE, THERIEN ([id number]) -------------------------------------------------------------------------------- Physical Exam Details Patient Name: [name] Date of Service: 08/22/2016 8:00 AM Medical Record Number: [id number] Patient Account Number: [id number] Date of Birth/Sex: [birth year] (82 y.o. Male) Treating RN: Baruch Gouty, RN, BSN, Velva Harman Primary Care Physician: Unk Pinto Other Clinician: Referring Physician: Unk Pinto Treating Physician/Extender: Loistine Chance in Treatment: 3 Constitutional morbidly obese chronically ill appearing male in NAD. Ears, Nose, Mouth, and Throat Patient is hard of hearing.Marland Kitchen Respiratory Respiratory effort  is easy and symmetric bilaterally. Rate is normal at rest and on room air.. Cardiovascular Extremities are free of varicosities, clubbing or edema.Marland Kitchen Psychiatric Judgement and insight intact.. Alert and oriented times 3.. No evidence of depression, anxiety, or agitation. Calm, cooperative, and communicative. Appropriate interactions and affect.. Electronic Signature(s) Signed: 08/22/2016 5:31:30 PM By: Londell Moh FNP Entered By: Londell Moh on 08/22/2016 08:46:28 Johnny Bridge ([id number]) -------------------------------------------------------------------------------- Physician Orders Details  Patient Name: [name]. Date of Service: 08/22/2016 8:00 AM Medical Record Number: [id number] Patient Account Number: [id number] Date of Birth/Sex: 10-21-1929 (82 y.o. Male) Treating RN: Baruch Gouty, RN, BSN, [place] Primary Care Physician: Unk Pinto Other Clinician: Referring Physician: Unk Pinto Treating Physician/Extender: Loistine Chance in Treatment: 3 Verbal / Phone Orders: Yes Clinician: Afful, RN, BSN, Rita Read Back and Verified: Yes Diagnosis Coding ICD-10 Coding Code Description [phone] Non-pressure chronic ulcer of right ankle with fat layer exposed L97.512 Non-pressure chronic ulcer of other part of right foot with fat layer exposed I73.9 Peripheral vascular disease, unspecified M21.611 Bunion of right foot I89.0 Lymphedema, not elsewhere classified E66.9 Obesity, unspecified Wound Cleansing Wound #1 Right,Lateral Malleolus o Cleanse wound with mild soap and water o May Shower, gently pat wound dry prior to applying new dressing. Wound #2 Right,Medial Toe Great o Cleanse wound with mild soap and water o May Shower, gently pat wound dry prior to applying new dressing. Anesthetic Wound #1 Right,Lateral Malleolus o Topical Lidocaine 4% cream applied to wound bed prior to debridement Wound #2 Right,Medial Toe Great o Topical  Lidocaine 4% cream applied to wound bed prior to debridement Skin Barriers/Peri-Wound Care Wound #1 Right,Lateral Malleolus o Barrier cream Wound #2 Right,Medial Toe Great o Barrier cream Primary Wound Dressing Wound #1 Right,Lateral Malleolus o Sorbal[place] Ag RUBLE, PUMPHREY ([id number]) Wound #2 Right,Medial Toe Great o [place]bal[place] Ag Secondary Dressing Wound #1 Right,Lateral Malleolus o Dry Gauze o Boardered Foam Dressing Wound #2 Right,Medial Toe Great o Dry Gauze o Boardered Foam Dressing Dressing Change Frequency Wound #1 Right,Lateral Malleolus o Change dressing every day. Wound #2 Right,Medial Toe Great o Change dressing every other day. Follow-up Appointments Wound #1 Right,Lateral Malleolus o Return Appointment in 2 weeks. Wound #2 Right,Medial Toe Great o Return Appointment in 2 weeks. Off-Loading Wound #1 Right,Lateral Malleolus o Open toe surgical shoe with peg assist. Wound #2 Right,Medial Toe Great o Open toe surgical shoe with peg assist. Additional Orders / Instructions Wound #1 Right,Lateral Malleolus o Increase protein intake. o Activity as tolerated o Other: - MVI, Vit A, C, Zinc Wound #2 Right,Medial Toe Great o Increase protein intake. o Activity as tolerated o Other: - MVI, Vit A, C, Zinc BRAXEN, DOBEK ([id number]) Electronic Signature(s) Signed: 08/22/2016 5:31:30 PM By: Londell Moh FNP Entered By: Londell Moh on 08/22/2016 09:02:05 JADARIUS, COMMONS ([id number]) -------------------------------------------------------------------------------- Problem List Details Patient Name: HONG, MORING. Date of Service: 08/22/2016 8:00 AM Medical Record Number: [id number] Patient Account Number: [id number] Date of Birth/Sex: December 23, 1928 (82 y.o. Male) Treating RN: Baruch Gouty, RN, BSN, Velva Harman Primary Care Physician: Unk Pinto Other Clinician: Referring Physician: Unk Pinto Treating  Physician/Extender: Loistine Chance in Treatment: 3 Active Problems ICD-10 Encounter Code Description Active Date Diagnosis L97.312 Non-pressure chronic ulcer of right ankle with fat layer 07/31/2016 Yes exposed L97.512 Non-pressure chronic ulcer of other part of right foot with 07/31/2016 Yes fat layer exposed I73.9 Peripheral vascular disease, unspecified 07/31/2016 Yes M21.611 Bunion of right foot 07/31/2016 Yes I89.0 Lymphedema, not elsewhere classified 07/31/2016 Yes E66.9 Obesity, unspecified 07/31/2016 Yes Inactive Problems Resolved Problems Electronic Signature(s) Signed: 08/22/2016 5:31:30 PM By: Londell Moh FNP Entered By: Londell Moh on 08/22/2016 08:39:24 Johnny Bridge ([id number]) -------------------------------------------------------------------------------- Progress Note Details Patient Name: Johnny Bridge Date of Service: 08/22/2016 8:00 AM Medical Record Number: [id number] Patient Account Number: [id number] Date of Birth/Sex: [birth year] (82 y.o. Male) Treating RN: Baruch Gouty, RN, BSN, Velva Harman Primary Care Physician: Unk Pinto Other Clinician: Referring Physician: Unk Pinto  Treating Physician/Extender: Loistine Chance in Treatment: 3 Subjective Chief Complaint Information obtained from Patient Patient presents to the wound care center for a consult due non healing wound the right lateral ankle and medial foot which she's had for about a month. History of Present Illness (HPI) The following HPI elements were documented for the patient's wound: Location: right lateral ankle and medial foot Quality: Patient reports experiencing a dull pain to affected area(s). Severity: Patient states wound are getting worse. Duration: Patient has had the wound for < 4 weeks prior to presenting for treatment Timing: Pain in wound is Intermittent (comes and goes Context: The wound appeared gradually over time Modifying Factors: Other treatment(s)  tried include:as been put on doxycycline and is taking local care with hydrogen peroxide Associated Signs and Symptoms: Patient reports having increase swelling. 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 his past medical history is significant for anemia, arthritis, renal cancer with nephrectomy done in June 2013, lung cancer, supposed neck surgery, appendectomy, low back surgery, cholecystectomy, coronary angioplasty, laparoscopic left nephrectomy. PCP recently started him on doxycycline 100 mg. He is not a smoker. 08/07/2016 -- arterial and venous studies are still pending and his x-rays have not been done yet 08/22/16: pt returns today, with his daughter, for f/u. he followed with podiatry yesterday who is contemplating possible shaving of the 1st metatarsal bone once wound has healed for prevention of further issues. I reviewed xray findings from 07/31/16 that shows no acute findings. no evidence for osteo. arterial and venous studies are still pending. his wounds have improved from prior visit. he continues to have intermittent pain, but not significant and has not progressed. no systemic s/s of infection. Objective Constitutional KELDON, LASSEN ([id number]) morbidly obese chronically ill appearing male in NAD. Vitals Time Taken: 8:20 AM, Height: 69 in, Weight: 259 lbs, BMI: 38.2, Temperature: 98.0 F, Pulse: 80 bpm, Respiratory Rate: 18 breaths/min, Blood Pressure: 160/55 mmHg. Ears, Nose, Mouth, and Throat Patient is hard of hearing.Marland Kitchen Respiratory Respiratory effort is easy and symmetric bilaterally. Rate is normal at rest and on room air.. Cardiovascular Extremities are free of varicosities, clubbing or edema.Marland Kitchen Psychiatric Judgement and insight intact.. Alert and oriented times 3.. No evidence of depression, anxiety, or  agitation. Calm, cooperative, and communicative. Appropriate interactions and affect.. Integumentary (Hair, Skin) Wound #1 status is Open. Original cause of wound was Gradually Appeared. The wound is located on the Right,Lateral Malleolus. The wound measures 2.2cm length x 1.4cm width x 0.2cm depth; 2.419cm^2 area and 0.484cm^3 volume. The wound is limited to skin breakdown. There is no tunneling or undermining noted. There is a medium amount of serosanguineous drainage noted. The wound margin is indistinct and nonvisible. There is medium (34-66%) pink, pale granulation within the wound bed. There is a medium (34- 66%) amount of necrotic tissue within the wound bed including Adherent Slough. The periwound skin appearance exhibited: Localized Edema, Maceration, Moist. The periwound skin appearance did not exhibit: Callus, Crepitus, Excoriation, Fluctuance, Friable, Induration, Rash, Scarring, Dry/Scaly, Atrophie Blanche, Cyanosis, Ecchymosis, Hemosiderin Staining, Mottled, Pallor, Rubor, Erythema. Periwound temperature was noted as No Abnormality. The periwound has tenderness on palpation. Wound #2 status is Open. Original cause of wound was Gradually Appeared. The wound is located on the [employer]. The wound measures 1.5cm length x 1.5cm width x 0.1cm depth; 1.767cm^2 area and 0.177cm^3 volume. The wound is limited to skin breakdown.  There is no tunneling or undermining noted. There is a medium amount of serosanguineous drainage noted. The wound margin is distinct with the outline attached to the wound base. There is medium (34-66%) pink, pale granulation within the wound bed. There is a medium (34-66%) amount of necrotic tissue within the wound bed including Adherent Slough. The periwound skin appearance exhibited: Moist. The periwound skin appearance did not exhibit: Callus, Crepitus, Excoriation, Fluctuance, Friable, Induration, Localized Edema, Rash, Scarring,  Dry/Scaly, Maceration, Atrophie Blanche, Cyanosis, Ecchymosis, Hemosiderin Staining, Mottled, Pallor, Rubor, Erythema. Periwound temperature was noted as No Abnormality. The periwound has tenderness on palpation. Assessment JOSTEN, WARMUTH ([id number]) Active Problems ICD-10 [phone] - Non-pressure chronic ulcer of right ankle with fat layer exposed L97.512 - Non-pressure chronic ulcer of other part of right foot with fat layer exposed I73.9 - Peripheral vascular disease, unspecified M21.611 - Bunion of right foot I89.0 - Lymphedema, not elsewhere classified E66.9 - Obesity, unspecified Diagnoses ICD-10 L97.312: Non-pressure chronic ulcer of right ankle with fat layer exposed L97.512: Non-pressure chronic ulcer of other part of right foot with fat layer exposed I73.9: Peripheral vascular disease, unspecified M21.611: Bunion of right foot I89.0: Lymphedema, not elsewhere classified E66.9: Obesity, unspecified Plan Wound Cleansing: Wound #1 Right,Lateral Malleolus: Cleanse wound with mild soap and water May Shower, gently pat wound dry prior to applying new dressing. Wound #2 Right,Medial Toe Great: Cleanse wound with mild soap and water May Shower, gently pat wound dry prior to applying new dressing. Anesthetic: Wound #1 Right,Lateral Malleolus: Topical Lidocaine 4% cream applied to wound bed prior to debridement Wound #2 Right,Medial Toe Great: Topical Lidocaine 4% cream applied to wound bed prior to debridement Skin Barriers/Peri-Wound Care: Wound #1 Right,Lateral Malleolus: Barrier cream Wound #2 Right,Medial Toe Great: Barrier cream Primary Wound Dressing: Wound #1 Right,Lateral Malleolus: Sorbal[place] Ag Wound #2 Right,Medial Toe Great: [place]bal[place] Ag Secondary Dressing: Wound #1 Right,Lateral MalleolusJOEL, COWIN ([id number]) Dry Gauze Boardered Foam Dressing Wound #2 Right,Medial Toe Great: Dry Gauze Boardered Foam Dressing Dressing Change  Frequency: Wound #1 Right,Lateral Malleolus: Change dressing every day. Wound #2 Right,Medial Toe Great: Change dressing every other day. Follow-up Appointments: Wound #1 Right,Lateral Malleolus: Return Appointment in 2 weeks. Wound #2 Right,Medial Toe Great: Return Appointment in 2 weeks. Off-Loading: Wound #1 Right,Lateral Malleolus: Open toe surgical shoe with peg assist. Wound #2 Right,Medial Toe Great: Open toe surgical shoe with peg assist. Additional Orders / Instructions: Wound #1 Right,Lateral Malleolus: Increase protein intake. Activity as tolerated Other: - MVI, Vit A, C, Zinc Wound #2 Right,Medial Toe Great: Increase protein intake. Activity as tolerated Other: - MVI, Vit A, C, Zinc Follow-Up Appointments: A Patient Clinical Summary of Care was provided to TC 1. I discussed clinical findings and implications with pt and daugther. all questions were answered. 2. advised to proceed with vascular f/u as scheduled. 3. I reviewed and discussed right foot xray findings with pt and daughter. all questions were answered. 4. Discussed pathophysiology of osteomyelitis with pt and daughter, who had questions regarding this. BRAILEN, MACNEAL ([id number]) Electronic Signature(s) Signed: 08/22/2016 5:31:30 PM By: Londell Moh FNP Entered By: Londell Moh on 08/22/2016 09:04:18 CREED, KAIL ([id number]) -------------------------------------------------------------------------------- [facility] Details Patient Name: FAROUK, VIVERO. Date of Service: 08/22/2016 Medical Record Number: [id number] Patient Account Number: [id number] Date of Birth/Sex: [birth year] (82 y.o. Male) Treating RN: Baruch Gouty, RN, BSN, Velva Harman Primary Care Physician: Unk Pinto Other Clinician: Referring Physician: Unk Pinto Treating Physician/Extender: Loistine Chance in Treatment: 3 Diagnosis Coding ICD-10 Codes Code Description  L97.312 Non-pressure chronic ulcer of right ankle  with fat layer exposed L97.512 Non-pressure chronic ulcer of other part of right foot with fat layer exposed I73.9 Peripheral vascular disease, unspecified M21.611 Bunion of right foot I89.0 Lymphedema, not elsewhere classified E66.9 Obesity, unspecified Facility Procedures CPT4 Code: [id number] Description: 99213 - WOUND CARE VISIT-LEV 3 EST PT Modifier: Quantity: 1 Physician Procedures CPT4 Code Description: [id number] - WC PHYS LEVEL 3 - EST PT ICD-10 Description Diagnosis L97.312 Non-pressure chronic ulcer of right ankle with fat l L97.512 Non-pressure chronic ulcer of other part of right fo I73.9 Peripheral vascular disease,  unspecified I89.0 Lymphedema, not elsewhere classified Modifier: ayer exposed ot with fat lay Quantity: 1 er exposed Electronic Signature(s) Signed: 08/22/2016 4:31:36 PM By: Regan Lemming BSN, RN Signed: 08/22/2016 5:31:30 PM By: Londell Moh FNP Entered By: Regan Lemming on 08/22/2016 16:31:36

## 2016-08-23 NOTE — Progress Notes (Signed)
ERLING, ARRAZOLA ([id number]) Visit Report for 08/22/2016 Arrival Information Details Patient Name: [name], [name]. Date of Service: 08/22/2016 8:00 AM Medical Record Number: [id number] Patient Account Number: [id number] Date of Birth/Sex: 1929-09-23 (82 y.o. Male) Treating RN: Baruch Gouty, RN, BSN, Velva Harman Primary Care Physician: Unk Pinto Other Clinician: Referring Physician: Unk Pinto Treating Physician/Extender: Loistine Chance in Treatment: 3 Visit Information History Since Last Visit All ordered tests and consults were completed: No Patient Arrived: Wheel Chair Added or deleted any medications: No Arrival Time: 08:16 Any new allergies or adverse reactions: No Accompanied By: dtr Had a fall or experienced change in No Transfer Assistance: None activities of daily living that may affect Patient Identification Verified: Yes risk of falls: Secondary Verification Process Yes Signs or symptoms of abuse/neglect since last No Completed: visito Patient Requires Transmission- No Hospitalized since last visit: No Based Precautions: Has Dressing in Place as Prescribed: Yes Patient Has Alerts: Yes Pain Present Now: No Patient Alerts: Patient on Blood Thinner ASA Electronic Signature(s) Signed: 08/22/2016 4:35:15 PM By: Regan Lemming BSN, RN Entered By: Regan Lemming on 08/22/2016 08:16:56 [name] ([id number]) -------------------------------------------------------------------------------- Clinic Level of Care Assessment Details Patient Name: [name] Date of Service: 08/22/2016 8:00 AM Medical Record Number: [id number] Patient Account Number: [id number] Date of Birth/Sex: [birth year] (82 y.o. Male) Treating RN: Baruch Gouty, RN, BSN, [place] Primary Care Physician: Unk Pinto Other Clinician: Referring Physician: Unk Pinto Treating Physician/Extender: Loistine Chance in Treatment: 3 Clinic Level of Care Assessment Items TOOL 4 Quantity  Score '[]'$  - Use when only an EandM is performed on FOLLOW-UP visit 0 ASSESSMENTS - Nursing Assessment / Reassessment X - Reassessment of Co-morbidities (includes updates in patient status) 1 10 X - Reassessment of Adherence to Treatment Plan 1 5 ASSESSMENTS - Wound and Skin Assessment / Reassessment '[]'$  - Simple Wound Assessment / Reassessment - one wound 0 X - Complex Wound Assessment / Reassessment - multiple wounds 2 5 '[]'$  - Dermatologic / Skin Assessment (not related to wound area) 0 ASSESSMENTS - Focused Assessment '[]'$  - Circumferential Edema Measurements - multi extremities 0 '[]'$  - Nutritional Assessment / Counseling / Intervention 0 X - Lower Extremity Assessment (monofilament, tuning fork, pulses) 1 5 '[]'$  - Peripheral Arterial Disease Assessment (using hand held doppler) 0 ASSESSMENTS - Ostomy and/or Continence Assessment and Care '[]'$  - Incontinence Assessment and Management 0 '[]'$  - Ostomy Care Assessment and Management (repouching, etc.) 0 PROCESS - Coordination of Care X - Simple Patient / Family Education for ongoing care 1 15 '[]'$  - Complex (extensive) Patient / Family Education for ongoing care 0 '[]'$  - Staff obtains Programmer, systems, Records, Test Results / Process Orders 0 '[]'$  - Staff telephones HHA, Nursing Homes / Clarify orders / etc 0 '[]'$  - Routine Transfer to another Facility (non-emergent condition) 0 [name], [name] ([id number]) '[]'$  - Routine Hospital Admission (non-emergent condition) 0 '[]'$  - New Admissions / Biomedical engineer / Ordering NPWT, Apligraf, etc. 0 '[]'$  - Emergency Hospital Admission (emergent condition) 0 '[]'$  - Simple Discharge Coordination 0 '[]'$  - Complex (extensive) Discharge Coordination 0 PROCESS - Special Needs '[]'$  - Pediatric / Minor Patient Management 0 '[]'$  - Isolation Patient Management 0 '[]'$  - Hearing / Language / Visual special needs 0 '[]'$  - Assessment of Community assistance (transportation, D/C planning, etc.) 0 '[]'$  - Additional assistance / Altered mentation  0 '[]'$  - Support Surface(s) Assessment (bed, cushion, seat, etc.) 0 INTERVENTIONS - Wound Cleansing / Measurement '[]'$  - Simple Wound Cleansing - one wound 0 X -  Complex Wound Cleansing - multiple wounds 2 5 X - Wound Imaging (photographs - any number of wounds) 1 5 '[]'$  - Wound Tracing (instead of photographs) 0 '[]'$  - Simple Wound Measurement - one wound 0 X - Complex Wound Measurement - multiple wounds 2 5 INTERVENTIONS - Wound Dressings X - Small Wound Dressing one or multiple wounds 2 10 '[]'$  - Medium Wound Dressing one or multiple wounds 0 '[]'$  - Large Wound Dressing one or multiple wounds 0 '[]'$  - Application of Medications - topical 0 '[]'$  - Application of Medications - injection 0 INTERVENTIONS - Miscellaneous '[]'$  - External ear exam 0 [name], [name] ([id number]) '[]'$  - Specimen Collection (cultures, biopsies, blood, body fluids, etc.) 0 '[]'$  - Specimen(s) / Culture(s) sent or taken to Lab for analysis 0 '[]'$  - Patient Transfer (multiple staff / Harrel Lemon Lift / Similar devices) 0 '[]'$  - Simple Staple / Suture removal (25 or less) 0 '[]'$  - Complex Staple / Suture removal (26 or more) 0 '[]'$  - Hypo / Hyperglycemic Management (close monitor of Blood Glucose) 0 '[]'$  - Ankle / Brachial Index (ABI) - do not check if billed separately 0 X - Vital Signs 1 5 Has the patient been seen at the hospital within the last three years: Yes Total Score: 95 Level Of Care: New/Established - Level 3 Electronic Signature(s) Signed: 08/22/2016 4:35:15 PM By: Regan Lemming BSN, RN Entered By: Regan Lemming on 08/22/2016 08:48:10 [name] ([id number]) -------------------------------------------------------------------------------- Encounter Discharge Information Details Patient Name: [name], [name]. Date of Service: 08/22/2016 8:00 AM Medical Record Number: [id number] Patient Account Number: [id number] Date of Birth/Sex: November 24, 1928 (82 y.o. Male) Treating RN: Baruch Gouty, RN, BSN, Velva Harman Primary Care Physician: Unk Pinto Other Clinician: Referring Physician: Unk Pinto Treating Physician/Extender: Loistine Chance in Treatment: 3 Encounter Discharge Information Items Discharge Pain Level: 0 Discharge Condition: Stable Ambulatory Status: Wheelchair Discharge Destination: Home Transportation: Private Auto Accompanied By: dtr Schedule Follow-up Appointment: No Medication Reconciliation completed and provided to Patient/Care No Francesca Strome: Provided on Clinical Summary of Care: 08/22/2016 Form Type Recipient Paper Patient TC Electronic Signature(s) Signed: 08/22/2016 4:35:15 PM By: Regan Lemming BSN, RN Previous Signature: 08/22/2016 8:48:27 AM Version By: Ruthine Dose Entered By: Regan Lemming on 08/22/2016 08:50:17 [name] ([id number]) -------------------------------------------------------------------------------- Lower Extremity Assessment Details Patient Name: [name], [name]. Date of Service: 08/22/2016 8:00 AM Medical Record Number: [id number] Patient Account Number: [id number] Date of Birth/Sex: [birth year] (82 y.o. Male) Treating RN: Baruch Gouty, RN, BSN, Velva Harman Primary Care Physician: Unk Pinto Other Clinician: Referring Physician: Unk Pinto Treating Physician/Extender: Loistine Chance in Treatment: 3 Vascular Assessment Pulses: Posterior Tibial Dorsalis Pedis Palpable: [Right:Yes] Extremity colors, hair growth, and conditions: Extremity Color: [Right:Mottled] Hair Growth on Extremity: [Right:No] Temperature of Extremity: [Right:Warm] Capillary Refill: [Right:< 3 seconds] Toe Nail Assessment Left: Right: Thick: Yes Discolored: Yes Deformed: No Improper Length and Hygiene: No Electronic Signature(s) Signed: 08/22/2016 4:35:15 PM By: Regan Lemming BSN, RN Entered By: Regan Lemming on 08/22/2016 08:17:30 [name] ([id number]) -------------------------------------------------------------------------------- Multi Wound Chart  Details Patient Name: [name] Date of Service: 08/22/2016 8:00 AM Medical Record Number: [id number] Patient Account Number: [id number] Date of Birth/Sex: February 26, 1929 (82 y.o. Male) Treating RN: Baruch Gouty, RN, BSN, Velva Harman Primary Care Physician: Unk Pinto Other Clinician: Referring Physician: Unk Pinto Treating Physician/Extender: Loistine Chance in Treatment: 3 Vital Signs Height(in): 69 Pulse(bpm): 80 Weight(lbs): 259 Blood Pressure 160/55 (mmHg): Body Mass Index(BMI): 38 Temperature(F): 98.0 Respiratory Rate 18 (breaths/min): Photos: [1:No Photos] [2:No Photos] [N/A:N/A] Wound Location: [1:Right  Malleolus - Lateral] [2:Right Toe Great - Medial] [N/A:N/A] Wounding Event: [1:Gradually Appeared] [2:Gradually Appeared] [N/A:N/A] Primary Etiology: [1:Venous Leg Ulcer] [2:Pressure Ulcer] [N/A:N/A] Comorbid History: [1:Cataracts, Arrhythmia, Coronary Artery Disease, Hypertension, Gout, Osteoarthritis] [2:Cataracts, Arrhythmia, Coronary Artery Disease, Hypertension, Gout, Osteoarthritis] [N/A:N/A] Date Acquired: [1:06/30/2016] [2:07/03/2016] [N/A:N/A] Weeks of Treatment: [1:3] [2:3] [N/A:N/A] Wound Status: [1:Open] [2:Open] [N/A:N/A] Measurements L x W x D 2.2x1.4x0.2 [2:1.5x1.5x0.1] [N/A:N/A] (cm) Area (cm) : [1:2.419] [2:1.767] [N/A:N/A] Volume (cm) : [1:0.484] [2:0.177] [N/A:N/A] % Reduction in Area: [1:38.40%] [2:-801.50%] [N/A:N/A] % Reduction in Volume: 38.30% [2:-785.00%] [N/A:N/A] Classification: [1:Full Thickness Without Exposed Support Structures] [2:Category/Stage II] [N/A:N/A] Exudate Amount: [1:Medium] [2:Medium] [N/A:N/A] Exudate Type: [1:Serosanguineous] [2:Serosanguineous] [N/A:N/A] Exudate Color: [1:red, brown] [2:red, brown] [N/A:N/A] Foul Odor After [1:Yes] [2:No] [N/A:N/A] Cleansing: Odor Anticipated Due to No [2:N/A] [N/A:N/A] Product Use: Wound Margin: [1:Indistinct, nonvisible] [2:Distinct, outline attached N/A] Granulation  Amount: [1:Medium (34-66%)] [2:Medium (34-66%)] [N/A:N/A] Granulation Quality: [1:Pink, Pale] [2:Pink, Pale] [N/A:N/A] Necrotic Amount: Medium (34-66%) Medium (34-66%) N/A Exposed Structures: Fascia: No Fascia: No N/A Fat: No Fat: No Tendon: No Tendon: No Muscle: No Muscle: No Joint: No Joint: No Bone: No Bone: No Limited to Skin Limited to Skin Breakdown Breakdown Epithelialization: None None N/A Periwound Skin Texture: Edema: Yes Edema: No N/A Excoriation: No Excoriation: No Induration: No Induration: No Callus: No Callus: No Crepitus: No Crepitus: No Fluctuance: No Fluctuance: No Friable: No Friable: No Rash: No Rash: No Scarring: No Scarring: No Periwound Skin Maceration: Yes Moist: Yes N/A Moisture: Moist: Yes Maceration: No Dry/Scaly: No Dry/Scaly: No Periwound Skin Color: Atrophie Blanche: No Atrophie Blanche: No N/A Cyanosis: No Cyanosis: No Ecchymosis: No Ecchymosis: No Erythema: No Erythema: No Hemosiderin Staining: No Hemosiderin Staining: No Mottled: No Mottled: No Pallor: No Pallor: No Rubor: No Rubor: No Temperature: No Abnormality No Abnormality N/A Tenderness on Yes Yes N/A Palpation: Wound Preparation: Ulcer Cleansing: Ulcer Cleansing: N/A Rinsed/Irrigated with Rinsed/Irrigated with Saline Saline Topical Anesthetic Topical Anesthetic Applied: Other: lidocaine Applied: Other: lidocaine 4% 4% Treatment Notes Electronic Signature(s) Signed: 08/22/2016 4:35:15 PM By: Regan Lemming BSN, RN Entered By: Regan Lemming on 08/22/2016 08:34:51 [name], [name] ([id number]) -------------------------------------------------------------------------------- [place] Details Patient Name: [name], [name]. Date of Service: 08/22/2016 8:00 AM Medical Record Number: [id number] Patient Account Number: [id number] Date of Birth/Sex: 12-06-1928 (82 y.o. Male) Treating RN: Baruch Gouty, RN, BSN, Velva Harman Primary Care Physician: Unk Pinto Other Clinician: Referring Physician: Unk Pinto Treating Physician/Extender: Loistine Chance in Treatment: 3 Active Inactive Orientation to the Wound Care Program Nursing Diagnoses: Knowledge deficit related to the wound healing center program Goals: Patient/caregiver will verbalize understanding of the [facility] Program Date Initiated: 07/31/2016 Goal Status: Active Interventions: Provide education on orientation to the wound center Notes: Venous Leg Ulcer Nursing Diagnoses: Knowledge deficit related to disease process and management Potential for venous Insuffiency (use before diagnosis confirmed) Goals: Non-invasive venous studies are completed as ordered Date Initiated: 07/31/2016 Goal Status: Active Patient will maintain optimal edema control Date Initiated: 07/31/2016 Goal Status: Active Patient/caregiver will verbalize understanding of disease process and disease management Date Initiated: 07/31/2016 Goal Status: Active Verify adequate tissue perfusion prior to therapeutic compression application Date Initiated: 07/31/2016 Goal Status: Active Interventions: Assess peripheral edema status every visit. [name], [name] ([id number]) Compression as ordered Provide education on venous insufficiency Treatment Activities: Therapeutic compression applied : 07/31/2016 Notes: Wound/Skin Impairment Nursing Diagnoses: Impaired tissue integrity Knowledge deficit related to ulceration/compromised skin integrity Goals: Patient/caregiver will verbalize understanding of skin care regimen Date Initiated: 07/31/2016 Goal Status:  Active Ulcer/skin breakdown will have a volume reduction of 30% by week 4 Date Initiated: 07/31/2016 Goal Status: Active Ulcer/skin breakdown will have a volume reduction of 50% by week 8 Date Initiated: 07/31/2016 Goal Status: Active Ulcer/skin breakdown will have a volume reduction of 80% by week 12 Date Initiated:  07/31/2016 Goal Status: Active Ulcer/skin breakdown will heal within 14 weeks Date Initiated: 07/31/2016 Goal Status: Active Interventions: Assess patient/caregiver ability to obtain necessary supplies Assess patient/caregiver ability to perform ulcer/skin care regimen upon admission and as needed Assess ulceration(s) every visit Provide education on ulcer and skin care Treatment Activities: Referred to DME Kazuma Elena for dressing supplies : 07/31/2016 Skin care regimen initiated : 07/31/2016 Topical wound management initiated : 07/31/2016 Notes: Electronic Signature(s) [name], [name] ([id number]) Signed: 08/22/2016 4:35:15 PM By: Regan Lemming BSN, RN Entered By: Regan Lemming on 08/22/2016 08:34:03 [name] ([id number]) -------------------------------------------------------------------------------- Pain Assessment Details Patient Name: [name]. Date of Service: 08/22/2016 8:00 AM Medical Record Number: [id number] Patient Account Number: [id number] Date of Birth/Sex: 10-17-1929 (82 y.o. Male) Treating RN: Baruch Gouty, RN, BSN, Velva Harman Primary Care Physician: Unk Pinto Other Clinician: Referring Physician: Unk Pinto Treating Physician/Extender: Loistine Chance in Treatment: 3 Active Problems Location of Pain Severity and Description of Pain Patient Has Paino No Site Locations With Dressing Change: No Pain Management and Medication Current Pain Management: Electronic Signature(s) Signed: 08/22/2016 4:35:15 PM By: Regan Lemming BSN, RN Entered By: Regan Lemming on 08/22/2016 08:17:03 [name] ([id number]) -------------------------------------------------------------------------------- Patient/Caregiver Education Details Patient Name: [name], [name]. Date of Service: 08/22/2016 8:00 AM Medical Record Number: [id number] Patient Account Number: [id number] Date of Birth/Gender: 07-02-1929 (82 y.o. Male) Treating RN: Baruch Gouty, RN, BSN, Velva Harman Primary Care  Physician: Unk Pinto Other Clinician: Referring Physician: Unk Pinto Treating Physician/Extender: Loistine Chance in Treatment: 3 Education Assessment Education Provided To: Patient and Caregiver dtr Education Topics Provided Venous: Methods: Explain/Verbal Responses: State content correctly Welcome To [facility] [place]: Methods: Explain/Verbal Responses: State content correctly Wound/Skin Impairment: Methods: Explain/Verbal Responses: State content correctly Electronic Signature(s) Signed: 08/22/2016 4:35:15 PM By: Regan Lemming BSN, RN Entered By: Regan Lemming on 08/22/2016 08:50:48 [name] ([id number]) -------------------------------------------------------------------------------- Wound Assessment Details Patient Name: [name]. Date of Service: 08/22/2016 8:00 AM Medical Record Number: [id number] Patient Account Number: [id number] Date of Birth/Sex: Nov 24, 1928 (82 y.o. Male) Treating RN: Baruch Gouty, RN, BSN, [place] Primary Care Physician: Unk Pinto Other Clinician: Referring Physician: Unk Pinto Treating Physician/Extender: Loistine Chance in Treatment: 3 Wound Status Wound Number: 1 Primary Venous Leg Ulcer Etiology: Wound Location: Right Malleolus - Lateral Wound Open Wounding Event: Gradually Appeared Status: Date Acquired: 06/30/2016 Comorbid Cataracts, Arrhythmia, Coronary Artery Weeks Of Treatment: 3 History: Disease, Hypertension, Gout, Clustered Wound: No Osteoarthritis Photos Photo Uploaded By: Regan Lemming on 08/22/2016 15:34:37 Wound Measurements Length: (cm) 2.2 Width: (cm) 1.4 Depth: (cm) 0.2 Area: (cm) 2.419 Volume: (cm) 0.484 % Reduction in Area: 38.4% % Reduction in Volume: 38.3% Epithelialization: None Tunneling: No Undermining: No Wound Description Full Thickness Without Exposed Classification: Support Structures Wound Margin: Indistinct,  nonvisible Exudate Medium Amount: Exudate Type: Serosanguineous Exudate Color: red, brown Foul Odor After Cleansing: Yes Due to Product Use: No Wound Bed Granulation Amount: Medium (34-66%) Exposed Structure Granulation Quality: Pink, Pale Fascia Exposed: No Necrotic Amount: Medium (34-66%) Fat Layer Exposed: No Necrotic Quality: Adherent Slough Tendon Exposed: No SHANA, YOUNGE ([id number]) Muscle Exposed: No Joint Exposed: No Bone Exposed: No Limited to Skin Breakdown Periwound Skin Texture Texture Color No Abnormalities  Noted: No No Abnormalities Noted: No Callus: No Atrophie Blanche: No Crepitus: No Cyanosis: No Excoriation: No Ecchymosis: No Fluctuance: No Erythema: No Friable: No Hemosiderin Staining: No Induration: No Mottled: No Localized Edema: Yes Pallor: No Rash: No Rubor: No Scarring: No Temperature / Pain Moisture Temperature: No Abnormality No Abnormalities Noted: No Tenderness on Palpation: Yes Dry / Scaly: No Maceration: Yes Moist: Yes Wound Preparation Ulcer Cleansing: Rinsed/Irrigated with Saline Topical Anesthetic Applied: Other: lidocaine 4%, Treatment Notes Wound #1 (Right, Lateral Malleolus) 1. Cleansed with: Clean wound with Normal Saline 4. Dressing Applied: Aquacel Ag 5. Secondary Dressing Applied Bordered Foam Dressing Electronic Signature(s) Signed: 08/22/2016 4:35:15 PM By: Regan Lemming BSN, RN Entered By: Regan Lemming on 08/22/2016 08:29:05 [name], [name] ([id number]) -------------------------------------------------------------------------------- Wound Assessment Details Patient Name: [name], DOMBEK. Date of Service: 08/22/2016 8:00 AM Medical Record Number: [id number] Patient Account Number: [id number] Date of Birth/Sex: 1929-06-06 (82 y.o. Male) Treating RN: Baruch Gouty, RN, BSN, [place] Primary Care Physician: Unk Pinto Other Clinician: Referring Physician: Unk Pinto Treating Physician/Extender:  Loistine Chance in Treatment: 3 Wound Status Wound Number: 2 Primary Pressure Ulcer Etiology: Wound Location: Right Toe Great - Medial Wound Open Wounding Event: Gradually Appeared Status: Date Acquired: 07/03/2016 Comorbid Cataracts, Arrhythmia, Coronary Artery Weeks Of Treatment: 3 History: Disease, Hypertension, Gout, Clustered Wound: No Osteoarthritis Photos Photo Uploaded By: Regan Lemming on 08/22/2016 15:34:37 Wound Measurements Length: (cm) 1.5 Width: (cm) 1.5 Depth: (cm) 0.1 Area: (cm) 1.767 Volume: (cm) 0.177 % Reduction in Area: -801.5% % Reduction in Volume: -785% Epithelialization: None Tunneling: No Undermining: No Wound Description Classification: Category/Stage II Wound Margin: Distinct, outline attached Exudate Amount: Medium Exudate Type: Serosanguineous Exudate Color: red, brown Foul Odor After Cleansing: No Wound Bed Granulation Amount: Medium (34-66%) Exposed Structure Granulation Quality: Pink, Pale Fascia Exposed: No Necrotic Amount: Medium (34-66%) Fat Layer Exposed: No Necrotic Quality: Adherent Slough Tendon Exposed: No Muscle Exposed: No Joint Exposed: No TESLA, BOCHICCHIO ([id number]) Bone Exposed: No Limited to Skin Breakdown Periwound Skin Texture Texture Color No Abnormalities Noted: No No Abnormalities Noted: No Callus: No Atrophie Blanche: No Crepitus: No Cyanosis: No Excoriation: No Ecchymosis: No Fluctuance: No Erythema: No Friable: No Hemosiderin Staining: No Induration: No Mottled: No Localized Edema: No Pallor: No Rash: No Rubor: No Scarring: No Temperature / Pain Moisture Temperature: No Abnormality No Abnormalities Noted: No Tenderness on Palpation: Yes Dry / Scaly: No Maceration: No Moist: Yes Wound Preparation Ulcer Cleansing: Rinsed/Irrigated with Saline Topical Anesthetic Applied: Other: lidocaine 4%, Treatment Notes Wound #2 (Right, Medial Toe Great) 1. Cleansed with: Clean wound with  Normal Saline 4. Dressing Applied: Aquacel Ag 5. Secondary Dressing Applied Bordered Foam Dressing Electronic Signature(s) Signed: 08/22/2016 4:35:15 PM By: Regan Lemming BSN, RN Entered By: Regan Lemming on 08/22/2016 02:58:52 [name] ([id number]) -------------------------------------------------------------------------------- [place] Details Patient Name: [name] Date of Service: 08/22/2016 8:00 AM Medical Record Number: [id number] Patient Account Number: [id number] Date of Birth/Sex: [birth year] (82 y.o. Male) Treating RN: Baruch Gouty, RN, BSN, [place] Primary Care Physician: Unk Pinto Other Clinician: Referring Physician: Unk Pinto Treating Physician/Extender: Loistine Chance in Treatment: 3 Vital Signs Time Taken: 08:20 Temperature (F): 98.0 Height (in): 69 Pulse (bpm): 80 Weight (lbs): 259 Respiratory Rate (breaths/min): 18 Body Mass Index (BMI): 38.2 Blood Pressure (mmHg): 160/55 Reference Range: 80 - 120 mg / dl Electronic Signature(s) Signed: 08/22/2016 4:35:15 PM By: Regan Lemming BSN, RN Entered By: Regan Lemming on 08/22/2016 08:67:61

## 2016-08-24 NOTE — Progress Notes (Signed)
Patient ID: [name], male   DOB: [birth year], 82 y.o.   MRN: [id number] Subjective:  Patient presents today for evaluation of right foot ulcerations and nail care. Patient states that he was sent from the wound care center for routine foot care. Patient states that he has had wounds on his right foot and ankle for greater than 2 weeks. Patient has been seen and managed at the wound care center for approximately 3 weeks now.  Objective/Physical Exam General: The patient is alert and oriented x3 in no acute distress.  Dermatology: Wound #1 is located to the right medial first MPJ measuring approximately 2.5 cm 2.0 cm 0.2 cm (LxWxD). Wound #2 is located to the right lateral ankle overlying the fibular malleolus measuring 2.0 cm 2.0 cm 0.2 cm (LxWxD).   To the above-noted ulcerations there is no eschar there is no extensive amount of slough fibrin and necrotic tissue. Granulation tissue is red. There is exposed bone noted to the first MPJ ulceration. There is a moderate amount of serosanguineous drainage noted. No malodor. Periwound integrity is intact. Skin is warm, dry and supple bilateral lower extremities. Negative for open lesions or macerations.  Vascular: Pedal pulses diminished bilaterally. No edema or erythema noted. Capillary refill within normal limits.  Neurological: Epicritic and protective threshold absent bilaterally.   Musculoskeletal Exam: Range of motion within normal limits to all pedal and ankle joints bilateral. Muscle strength 5/5 in all groups bilateral.   Severe hallux abductovalgus deformity noted to the right first MPJ.  Assessment: #1 ulcerations to the first MPJ and lateral ankle the right lower extremity secondary to venous insufficiency. #2 hallux abductovalgus deformity with an increased IM angle greater than 20 right lower extremity #3 pain in right foot #4 pain in right ankle    Plan of Care:  #1 Patient was evaluated. #2 medically necessary  excisional debridement including subcutaneous tissues performed using a tissue nipper. Excisional debridement of all the necrotic nonviable tissue down to healthy bleeding viable tissue was performed with post-debridement measurements and was pre-.  #3 the wound was cleansed with normal saline #4 dry sterile dressing applied #5 discussed with patient possible surgical exostectomy of the first MPJ right foot due to exposed bone and high likelihood of osteomyelitis. Recommend surgical intervention. #6 patient is to return to the clinic in one month   Dr. Edrick Malone, [place]

## 2016-09-05 ENCOUNTER — Encounter: Payer: Medicare Other | Admitting: Surgery

## 2016-09-05 ENCOUNTER — Ambulatory Visit (INDEPENDENT_AMBULATORY_CARE_PROVIDER_SITE_OTHER): Payer: Medicare Other | Admitting: Podiatry

## 2016-09-05 DIAGNOSIS — I83015 Varicose veins of right lower extremity with ulcer other part of foot: Secondary | ICD-10-CM

## 2016-09-05 DIAGNOSIS — L97312 Non-pressure chronic ulcer of right ankle with fat layer exposed: Secondary | ICD-10-CM

## 2016-09-05 DIAGNOSIS — I83005 Varicose veins of unspecified lower extremity with ulcer other part of foot: Secondary | ICD-10-CM | POA: Diagnosis not present

## 2016-09-05 DIAGNOSIS — M86671 Other chronic osteomyelitis, right ankle and foot: Secondary | ICD-10-CM

## 2016-09-05 DIAGNOSIS — L97504 Non-pressure chronic ulcer of other part of unspecified foot with necrosis of bone: Secondary | ICD-10-CM

## 2016-09-05 DIAGNOSIS — L03115 Cellulitis of right lower limb: Secondary | ICD-10-CM | POA: Diagnosis not present

## 2016-09-05 DIAGNOSIS — L97519 Non-pressure chronic ulcer of other part of right foot with unspecified severity: Principal | ICD-10-CM

## 2016-09-05 NOTE — Progress Notes (Addendum)
CORNELLIUS, KROPP ([id number]) Visit Report for 09/05/2016 Chief Complaint Document Details Patient Name: [name], [name]. Date of Service: 09/05/2016 8:00 AM Medical Record Number: [id number] Patient Account Number: [id number] Date of Birth/Sex: 1929/07/16 (82 y.o. Male) Treating RN: Baruch Gouty, RN, BSN, Velva Harman Primary Care Physician: Unk Pinto Other Clinician: Referring Physician: Unk Pinto Treating Physician/Extender: Frann Rider in Treatment: 5 Information Obtained from: Patient Chief Complaint Patient presents to the wound care center for a consult due non healing wound the right lateral ankle and medial foot which she's had for about a month. Electronic Signature(s) Signed: 09/05/2016 8:58:59 AM By: Christin Fudge MD, FACS Entered By: Christin Fudge on 09/05/2016 08:58:58 [name], [name] ([id number]) -------------------------------------------------------------------------------- Debridement Details Patient Name: [name], [name]. Date of Service: 09/05/2016 8:00 AM Medical Record Number: [id number] Patient Account Number: [id number] Date of Birth/Sex: Mar 21, 1929 (82 y.o. Male) Treating RN: Baruch Gouty, RN, BSN, Velva Harman Primary Care Physician: Unk Pinto Other Clinician: Referring Physician: Unk Pinto Treating Physician/Extender: Frann Rider in Treatment: 5 Debridement Performed for Wound #1 Right,Lateral Malleolus Assessment: Performed By: Physician Christin Fudge, MD Debridement: Debridement Pre-procedure Yes - 08:40 Verification/Time Out Taken: Start Time: 08:40 Pain Control: Lidocaine 4% Topical Solution Level: Skin/Subcutaneous Tissue Total Area Debrided (L x 2 (cm) x 2 (cm) = 4 (cm) W): Tissue and other Non-Viable, Exudate, Fat, Fibrin/Slough, Subcutaneous material debrided: Instrument: Curette Bleeding: Minimum Hemostasis Achieved: Pressure End Time: 08:44 Procedural Pain: 0 Post Procedural Pain: 0 Response to Treatment:  Procedure was tolerated well Post Debridement Measurements of Total Wound Length: (cm) 2 Width: (cm) 2 Depth: (cm) 0.3 Volume: (cm) 0.942 Character of Wound/Ulcer Post Requires Further Debridement Debridement: Severity of Tissue Post Debridement: Fat layer exposed Post Procedure Diagnosis Same as Pre-procedure Electronic Signature(s) Signed: 09/05/2016 8:58:44 AM By: Christin Fudge MD, FACS Signed: 09/05/2016 5:11:59 PM By: Regan Lemming BSN, RN Entered By: Christin Fudge on 09/05/2016 08:58:44 TRYSTAN, AKHTAR ([id number], GEE ([id number]) -------------------------------------------------------------------------------- HPI Details Patient Name: [name], [name]. Date of Service: 09/05/2016 8:00 AM Medical Record Number: [id number] Patient Account Number: [id number] Date of Birth/Sex: 1929-03-27 (82 y.o. Male) Treating RN: Baruch Gouty, RN, BSN, Velva Harman Primary Care Physician: Unk Pinto Other Clinician: Referring Physician: Unk Pinto Treating Physician/Extender: Frann Rider in Treatment: 5 History of Present Illness Location: right lateral ankle and medial foot Quality: Patient reports experiencing a dull pain to affected area(s). Severity: Patient states wound are getting worse. Duration: Patient has had the wound for < 4 weeks prior to presenting for treatment Timing: Pain in wound is Intermittent (comes and goes Context: The wound appeared gradually over time Modifying Factors: Other treatment(s) tried include:as been put on doxycycline and is taking local care with hydrogen peroxide Associated Signs and Symptoms: Patient reports having increase swelling. 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 his past medical history is significant for anemia, arthritis, renal cancer with nephrectomy  done in June 2013, lung cancer, supposed neck surgery, appendectomy, low back surgery, cholecystectomy, coronary angioplasty, laparoscopic left nephrectomy. PCP recently started him on doxycycline 100 mg. He is not a smoker. 08/07/2016 -- arterial and venous studies are still pending and his x-rays have not been done yet 08/22/16: pt returns today, with his daughter, for f/u. he followed with podiatry yesterday who is contemplating possible shaving of the 1st metatarsal bone once wound has healed for prevention of further issues. I reviewed xray findings from  07/31/16 that shows no acute findings. no evidence for osteo. arterial and venous studies are still pending. his wounds have improved from prior visit. he continues to have intermittent pain, but not significant and has not progressed. no systemic s/s of infection. 09/05/2016 - -right foot x-ray -- IMPRESSION:1. Significant dorsal soft tissue swelling of uncertain etiology. 2. No acute fracture. Significant hallux valgus deformity.3. Atherosclerosis. He had an arterial duplex study done and only an ABI was done and the right was 1.02 and the left was 1.09 and there was biphasic flow and the impression was that of normal arterial flow to the extremities. Venous duplex examination done showed reflux in the right greater saphenous vein in the mid calf level but there was no DVT or SVT. The patient was seen by Dr. Daylene Katayama of podiatry on 08/21/2016 -- his examination and debridement was done and his opinion was that the patient needed a surgical exostectomy first MP joint of the right foot due to exposed bone and likely osteomyelitis. He had recommended surgical intervention. Electronic Signature(s) Signed: 09/05/2016 9:05:47 AM By: Christin Fudge MD, FACS Previous Signature: 09/05/2016 8:29:18 AM Version By: Christin Fudge MD, FACS Entered By: Christin Fudge on 09/05/2016 09:05:46 [name], [name] ([id number], [name]  ([id number]) -------------------------------------------------------------------------------- Physical Exam Details Patient Name: [name], [name] Date of Service: 09/05/2016 8:00 AM Medical Record Number: [id number] Patient Account Number: [id number] Date of Birth/Sex: July 25, 1929 (82 y.o. Male) Treating RN: Baruch Gouty, RN, BSN, Velva Harman Primary Care Physician: Unk Pinto Other Clinician: Referring Physician: Unk Pinto Treating Physician/Extender: Frann Rider in Treatment: 5 Constitutional . Pulse regular. Respirations normal and unlabored. Afebrile. . Eyes Nonicteric. Reactive to light. Ears, Nose, Mouth, and Throat Lips, teeth, and gums WNL.Marland Kitchen Moist mucosa without lesions. Neck supple and nontender. No palpable supraclavicular or cervical adenopathy. Normal sized without goiter. Respiratory WNL. No retractions.. Cardiovascular Pedal Pulses WNL. No clubbing, cyanosis or edema. Lymphatic No adneopathy. No adenopathy. No adenopathy. Musculoskeletal Adexa without tenderness or enlargement.. Digits and nails w/o clubbing, cyanosis, infection, petechiae, ischemia, or inflammatory conditions.. Integumentary (Hair, Skin) No suspicious lesions. No crepitus or fluctuance. No peri-wound warmth or erythema. No masses.Marland Kitchen Psychiatric Judgement and insight Intact.. No evidence of depression, anxiety, or agitation.. Notes the right lateral ankle wound has minimal debris which is sharply debrided with #3 curet and bleeding controlled with pressure. The right medial first MTP joint now has gotten much worse with cellulitis surrounding the forefoot and he has exposed bone and necrotic debris. since going to need operative debridement in the OR and I will speak to the podiatrist. Electronic Signature(s) Signed: 09/05/2016 9:07:45 AM By: Christin Fudge MD, FACS Entered By: Christin Fudge on 09/05/2016 09:07:44 [name]  ([id number]) -------------------------------------------------------------------------------- Physician Orders Details Patient Name: [name] Date of Service: 09/05/2016 8:00 AM Medical Record Number: [id number] Patient Account Number: [id number] Date of Birth/Sex: [birth year] (82 y.o. Male) Treating RN: Baruch Gouty, RN, BSN, Velva Harman Primary Care Physician: Unk Pinto Other Clinician: Referring Physician: Unk Pinto Treating Physician/Extender: Frann Rider in Treatment: 5 Verbal / Phone Orders: Yes Clinician: Afful, RN, BSN, Rita Read Back and Verified: Yes Diagnosis Coding Wound Cleansing Wound #1 Right,Lateral Malleolus o Cleanse wound with mild soap and water o May Shower, gently pat wound dry prior to applying new dressing. Wound #2 Right,Medial Toe Great o Cleanse wound with mild soap and water o May Shower, gently pat wound dry prior to applying new dressing. Anesthetic Wound #1 Right,Lateral Malleolus o Topical Lidocaine 4% cream applied  to wound bed prior to debridement Wound #2 Right,Medial Toe Great o Topical Lidocaine 4% cream applied to wound bed prior to debridement Skin Barriers/Peri-Wound Care Wound #1 Right,Lateral Malleolus o Barrier cream Wound #2 Right,Medial Toe Great o Barrier cream Primary Wound Dressing Wound #1 Right,Lateral Malleolus o Sorbal[place] Ag Wound #2 Right,Medial Toe Great o [place]bal[place] Ag Secondary Dressing Wound #1 Right,Lateral Malleolus o Dry Gauze o Boardered Foam Dressing Wound #2 [address] ([id number]) o Dry Gauze o Boardered Foam Dressing Dressing Change Frequency Wound #1 Right,Lateral Malleolus o Change dressing every day. Wound #2 Right,Medial Toe Great o Change dressing every other day. Follow-up Appointments Wound #1 Right,Lateral Malleolus o Return Appointment in 2 weeks. Wound #2 Right,Medial Toe Great o Return Appointment in 2  weeks. Off-Loading Wound #1 Right,Lateral Malleolus o Open toe surgical shoe with peg assist. Wound #2 Right,Medial Toe Great o Open toe surgical shoe with peg assist. Additional Orders / Instructions Wound #1 Right,Lateral Malleolus o Increase protein intake. o Activity as tolerated o Other: - MVI, Vit A, C, Zinc o Other: - 1.Marland KitchenMarland KitchenStart him on ABT 2...PATIENT DTR TO CALL DOPM AND MOVE APPT FORWARD FOR BONE SHAVING PROCEDURE 3...VASCULAR SURGERY FOR VENOUS REFLUX INTERVENTION AFTER DPM PROCDURE. Wound #2 Right,Medial Toe Great o Increase protein intake. o Activity as tolerated o Other: - MVI, Vit A, C, Zinc o Other: - 1.Marland KitchenMarland KitchenStart him on ABT 2...PATIENT DTR TO CALL DOPM AND MOVE APPT FORWARD FOR BONE SHAVING PROCEDURE 3...VASCULAR SURGERY FOR VENOUS REFLUX INTERVENTION AFTER DPM PROCDURE. Patient Medications Allergies: No known allergies Notifications Medication Indication Start End MAXTEN, SHULER ([id number]) Notifications Medication Indication Start End Cipro 09/05/2016 DOSE oral 500 mg tablet - tablet oral bid Notes 1..Will Start antibiotics 2..Patient to call DPM and move appointment forward to have foot surgery and have bone shave 3..Secondary will pursue vascular surgery for venous reflux intervention Electronic Signature(s) Signed: 09/05/2016 8:57:48 AM By: Christin Fudge MD, FACS Entered By: Christin Fudge on 09/05/2016 08:57:47 PRANAV, LINCE ([id number]) -------------------------------------------------------------------------------- Problem List Details Patient Name: STEDMAN, [place]. Date of Service: 09/05/2016 8:00 AM Medical Record Number: [id number] Patient Account Number: [id number] Date of Birth/Sex: 1929/08/29 (82 y.o. Male) Treating RN: Baruch Gouty, RN, BSN, Velva Harman Primary Care Physician: Unk Pinto Other Clinician: Referring Physician: Unk Pinto Treating Physician/Extender: Frann Rider in Treatment: 5 Active  Problems ICD-10 Encounter Code Description Active Date Diagnosis [phone] Non-pressure chronic ulcer of right ankle with fat layer 07/31/2016 Yes exposed L97.512 Non-pressure chronic ulcer of other part of right foot with 07/31/2016 Yes fat layer exposed I73.9 Peripheral vascular disease, unspecified 07/31/2016 Yes M21.611 Bunion of right foot 07/31/2016 Yes I89.0 Lymphedema, not elsewhere classified 07/31/2016 Yes E66.9 Obesity, unspecified 07/31/2016 Yes I83.013 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 Male) Treating RN: Baruch Gouty, RN, BSN, Velva Harman Primary Care Physician: Unk Pinto Other Clinician: Referring Physician: Unk Pinto Treating Physician/Extender: Frann Rider in Treatment: 5 Subjective Chief Complaint Information obtained from Patient Patient presents to the wound care center for a consult due non healing wound the right lateral ankle and medial foot which  she's had for about a month. History of Present Illness (HPI) The following HPI elements were documented for the patient's wound: Location: right lateral ankle and medial foot Quality: Patient reports experiencing a dull pain to affected area(s). Severity: Patient states wound are getting worse. Duration: Patient has had the wound for < 4 weeks prior to presenting for treatment Timing: Pain in wound is Intermittent (comes and  goes Context: The wound appeared gradually over time Modifying Factors: Other treatment(s) tried include:as been put on doxycycline and is taking local care with hydrogen peroxide Associated Signs and Symptoms: Patient reports having increase swelling. 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 his past medical history is significant for anemia, arthritis, renal cancer with nephrectomy done in June 2013, lung cancer, supposed neck surgery, appendectomy, low back surgery, cholecystectomy, coronary angioplasty, laparoscopic left nephrectomy. PCP recently started him on doxycycline 100 mg. He is not a smoker. 08/07/2016 -- arterial and venous studies are still pending and his x-rays have not been done yet 08/22/16: pt returns today, with his daughter, for f/u. he followed with podiatry yesterday who is contemplating possible shaving of the 1st metatarsal bone once wound has healed for prevention of further issues. I reviewed xray findings from 07/31/16 that shows no acute findings. no evidence for osteo. arterial and venous studies are still pending. his wounds have improved from prior visit. he continues to have intermittent pain, but not significant and has not progressed. no systemic s/s of infection. 09/05/2016 - -right foot x-ray -- IMPRESSION:1. Significant dorsal soft tissue swelling of uncertain etiology. 2. No acute fracture. Significant hallux valgus deformity.3. Atherosclerosis. He had an arterial duplex study done and only an ABI was done and the right was 1.02 and the left was 1.09 and there was biphasic flow and the impression was that of normal arterial flow to the extremities. Venous duplex examination done showed reflux in the right greater saphenous vein in the mid calf level but there was no DVT or SVT. [name], [name]  ([id number]) The patient was seen by Dr. Daylene Katayama of podiatry on 08/21/2016 -- his examination and debridement was done and his opinion was that the patient needed a surgical exostectomy first MP joint of the right foot due to exposed bone and likely osteomyelitis. He had recommended surgical intervention. Objective Constitutional Pulse regular. Respirations normal and unlabored. Afebrile. Vitals Time Taken: 8:08 AM, Height: 69 in, Weight: 259 lbs, BMI: 38.2, Temperature: 98.7 F, Pulse: 96 bpm, Respiratory Rate: 17 breaths/min, Blood Pressure: 138/69 mmHg. Eyes Nonicteric. Reactive to light. Ears, Nose, Mouth, and Throat Lips, teeth, and gums WNL.Marland Kitchen Moist mucosa without lesions. Neck supple and nontender. No palpable supraclavicular or cervical adenopathy. Normal sized without goiter. Respiratory WNL. No retractions.. Cardiovascular Pedal Pulses WNL. No clubbing, cyanosis or edema. Lymphatic No adneopathy. No adenopathy. No adenopathy. Musculoskeletal Adexa without tenderness or enlargement.. Digits and nails w/o clubbing, cyanosis, infection, petechiae, ischemia, or inflammatory conditions.Marland Kitchen Psychiatric Judgement and insight Intact.. No evidence of depression, anxiety, or agitation.. General Notes: the right lateral ankle wound has minimal debris which is sharply debrided with #3 curet and bleeding controlled with pressure. The right medial first MTP joint now has gotten much worse with cellulitis surrounding the forefoot and he has exposed bone and necrotic debris. since going to need operative debridement in the OR and I will speak to the podiatrist. Integumentary (Hair, Skin) [name], [name] (  [id number]) No suspicious lesions. No crepitus or fluctuance. No peri-wound warmth or erythema. No masses.. Wound #1 status is Open. Original cause of wound was Gradually Appeared. The wound is located on the Right,Lateral Malleolus. The wound measures 2cm length x 2cm width x 0.2cm  depth; 3.142cm^2 area and 0.628cm^3 volume. The wound is limited to skin breakdown. There is no tunneling or undermining noted. There is a medium amount of serosanguineous drainage noted. The wound margin is indistinct and nonvisible. There is medium (34-66%) pink, pale granulation within the wound bed. There is a medium (34- 66%) amount of necrotic tissue within the wound bed including Adherent Slough. The periwound skin appearance exhibited: Localized Edema, Maceration, Moist. The periwound skin appearance did not exhibit: Callus, Crepitus, Excoriation, Fluctuance, Friable, Induration, Rash, Scarring, Dry/Scaly, Atrophie Blanche, Cyanosis, Ecchymosis, Hemosiderin Staining, Mottled, Pallor, Rubor, Erythema. Periwound temperature was noted as No Abnormality. The periwound has tenderness on palpation. Wound #2 status is Open. Original cause of wound was Gradually Appeared. The wound is located on the [employer]. The wound measures 3.5cm length x 2.5cm width x 0.2cm depth; 6.872cm^2 area and 1.374cm^3 volume. There is bone exposed. There is no tunneling or undermining noted. There is a large amount of serosanguineous drainage noted. The wound margin is distinct with the outline attached to the wound base. There is medium (34-66%) pink, pale granulation within the wound bed. There is a medium (34-66%) amount of necrotic tissue within the wound bed including Adherent Slough. The periwound skin appearance exhibited: Localized Edema, Maceration, Moist. The periwound skin appearance did not exhibit: Callus, Crepitus, Excoriation, Fluctuance, Friable, Induration, Rash, Scarring, Dry/Scaly, Atrophie Blanche, Cyanosis, Ecchymosis, Hemosiderin Staining, Mottled, Pallor, Rubor, Erythema. Periwound temperature was noted as No Abnormality. The periwound has tenderness on palpation. 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 After review today I have recommended: KAORU, [name] ([id number]) 1. The patient needs to be seen by Dr. Amalia Hailey the podiatrist, ASAP for possible debridement and application of a wound VAC with or without antibiotic beads. 2. I have put in a call to speak with Dr. Amalia Hailey personally 3. In the meanwhile I have recommended ciprofloxacin 500 mg twice a day for 14 days 4. The right lateral ankle wound we'll continue to use silver alginate. 5. The arterial and venous studies have been discussed with the patient and his son who is at the bedside. Need a vascular surgeon's opinion regarding his right greater saphenous vein reflux. there understands the treatment plan and will be back to see me next week and will report to the ER immediately if his foot gets worse. Procedures Wound #1 Wound #1 is a Venous Leg Ulcer located on the Right,Lateral Malleolus . There was a Skin/Subcutaneous Tissue Debridement ([id number]) debridement with total area of 4 sq cm performed by Christin Fudge, MD. with the following instrument(s): Curette to remove Non-Viable tissue/material including Exudate, Fat, Fibrin/Slough, and Subcutaneous after achieving pain control using Lidocaine 4% Topical Solution. A  time out was conducted at 08:40, prior to the start of the procedure. A Minimum amount of bleeding was controlled with Pressure. The procedure was tolerated well with a pain level of 0 throughout and a pain  level of 0 following the procedure. Post Debridement Measurements: 2cm length x 2cm width x 0.3cm depth; 0.942cm^3 volume. Character of Wound/Ulcer Post Debridement requires further debridement. Severity of Tissue Post Debridement is: Fat layer exposed. Post procedure Diagnosis Wound #1: Same as Pre-Procedure Plan Wound Cleansing: Wound #1 Right,Lateral Malleolus: Cleanse wound with mild soap and water May Shower, gently pat wound dry prior to applying new dressing. Wound #2 Right,Medial Toe Great: Cleanse wound with mild soap and water May Shower, gently pat wound dry prior to applying new dressing. Anesthetic: Wound #1 Right,Lateral Malleolus: Topical Lidocaine 4% cream applied to wound bed prior to debridement Wound #2 Right,Medial Toe Great: Topical Lidocaine 4% cream applied to wound bed prior to debridement Skin Barriers/Peri-Wound Care: Wound #1 Right,Lateral Malleolus: Barrier cream Wound #2 Right,Medial [address][name], [name] ([id number]) Barrier cream Primary Wound Dressing: Wound #1 Right,Lateral Malleolus: Sorbal[place] Ag Wound #2 Right,Medial Toe Great: [place] Ag Secondary Dressing: Wound #1 Right,Lateral Malleolus: Dry Gauze Boardered Foam Dressing Wound #2 Right,Medial Toe Great: Dry Gauze Boardered Foam Dressing Dressing Change Frequency: Wound #1 Right,Lateral Malleolus: Change dressing every day. Wound #2 Right,Medial Toe Great: Change dressing every other day. Follow-up Appointments: Wound #1 Right,Lateral Malleolus: Return Appointment in 2 weeks. Wound #2 Right,Medial Toe Great: Return Appointment in 2 weeks. Off-Loading: Wound #1 Right,Lateral Malleolus: Open toe surgical shoe with peg assist. Wound #2 Right,Medial Toe Great: Open toe surgical shoe with peg assist. Additional Orders / Instructions: Wound #1 Right,Lateral Malleolus: Increase protein intake. Activity as tolerated Other: - MVI, Vit A, C, Zinc Other: - 1.Marland KitchenMarland KitchenStart him on  ABT 2...PATIENT DTR TO CALL DOPM AND MOVE APPT FORWARD FOR BONE SHAVING PROCEDURE 3...VASCULAR SURGERY FOR VENOUS REFLUX INTERVENTION AFTER DPM PROCDURE. Wound #2 Right,Medial Toe Great: Increase protein intake. Activity as tolerated Other: - MVI, Vit A, C, Zinc Other: - 1.Marland KitchenMarland KitchenStart him on ABT 2...PATIENT DTR TO CALL DOPM AND MOVE APPT FORWARD FOR BONE SHAVING PROCEDURE 3...VASCULAR SURGERY FOR VENOUS REFLUX INTERVENTION AFTER DPM PROCDURE. The following medication(s) was prescribed: Cipro oral 500 mg tablet tablet oral bid starting 09/05/2016 General Notes: 1..Will Start antibiotics 2..Patient to call DPM and move appointment forward to have foot surgery and have bone shave 3..Secondary will pursue vascular surgery for venous reflux intervention [name], [name] ([id number]) The patient has not been here since the last 2 weeks as he was out of town and in the meanwhile his right first MTP joint is definitely infected with osteomyelitis and surrounding cellulitis. After review today I have recommended: 1. The patient needs to be seen by Dr. Amalia Hailey the podiatrist, ASAP for possible debridement and application of a wound VAC with or without antibiotic beads. 2. I have put in a call to speak with Dr. Amalia Hailey personally 3. In the meanwhile I have recommended ciprofloxacin 500 mg twice a day for 14 days 4. The right lateral ankle wound we'll continue to use silver alginate. 5. The arterial and venous studies have been discussed with the patient and his son who is at the bedside. Need a vascular surgeon's opinion regarding his right greater saphenous vein reflux. there understands the treatment plan and will be back to see me next week and will report to the ER immediately if his foot gets worse. Electronic Signature(s) Signed: 09/05/2016  9:14:22 AM By: Christin Fudge MD, FACS Previous Signature: 09/05/2016 9:12:24 AM Version By: Christin Fudge MD, FACS Entered By: Christin Fudge on 09/05/2016  09:14:22 [name] ([id number]) -------------------------------------------------------------------------------- SuperBill Details Patient Name: ALLANTE, WHITMIRE. Date of Service: 09/05/2016 Medical Record Number: [id number] Patient Account Number: [id number] Date of Birth/Sex: 07-Apr-1929 (82 y.o. Male) Treating RN: Baruch Gouty, RN, BSN, [place] Primary Care Physician: Unk Pinto Other Clinician: Referring Physician: Unk Pinto Treating Physician/Extender: Frann Rider in Treatment: 5 Diagnosis Coding ICD-10 Codes Code Description [phone] Non-pressure chronic ulcer of right ankle with fat layer exposed L97.512 Non-pressure chronic ulcer of other part of right foot with fat layer exposed I73.9 Peripheral vascular disease, unspecified M21.611 Bunion of right foot I89.0 Lymphedema, not elsewhere classified E66.9 Obesity, unspecified Facility Procedures CPT4 Code Description: 28638177 11042 - DEB SUBQ TISSUE 20 SQ CM/< ICD-10 Description Diagnosis L97.312 Non-pressure chronic ulcer of right ankle with fat l L97.512 Non-pressure chronic ulcer of other part of right fo I73.9 Peripheral vascular disease,  unspecified I89.0 Lymphedema, not elsewhere classified Modifier: ayer exposed ot with fat la Quantity: 1 yer exposed Physician Procedures CPT4 Code Description: 1165790 38333 - WC PHYS LEVEL 3 - EST PT ICD-10 Description Diagnosis L97.312 Non-pressure chronic ulcer of right ankle with fat l L97.512 Non-pressure chronic ulcer of other part of right fo I73.9 Peripheral vascular disease,  unspecified M21.611 Bunion of right foot Modifier: 25 ayer exposed ot with fat lay Quantity: 1 er exposed CPT4 Code Description: 8329191 11042 - WC PHYS SUBQ TISS 20 SQ CM ICD-10 Description Diagnosis L97.312 Non-pressure chronic ulcer of right ankle with fat l L97.512 Non-pressure chronic ulcer of other part of right fo MYLIK, PRO ([id number]) Modifier: ayer exposed ot with fat  lay Quantity: 1 er exposed Electronic Signature(s) Signed: 09/05/2016 9:13:12 AM By: Christin Fudge MD, FACS Entered By: Christin Fudge on 09/05/2016 09:13:12

## 2016-09-06 NOTE — Progress Notes (Addendum)
IRBIN, FINES ([id number]) Visit Report for 09/05/2016 Arrival Information Details Patient Name: [name], [name]. Date of Service: 09/05/2016 8:00 AM Medical Record Number: [id number] Patient Account Number: [id number] Date of Birth/Sex: 11-03-1929 (82 y.o. Male) Treating RN: Baruch Gouty, RN, BSN, [place] Primary Care Physician: Unk Pinto Other Clinician: Referring Physician: Unk Pinto Treating Physician/Extender: Frann Rider in Treatment: 5 Visit Information History Since Last Visit All ordered tests and consults were No Patient Arrived: Wheel Chair completed: Arrival Time: 08:05 Added or deleted any medications: No Accompanied By: son Any new allergies or adverse reactions: No Transfer Assistance: EasyPivot Patient Had a fall or experienced change in No Lift activities of daily living that may affect Patient Identification Verified: Yes risk of falls: Secondary Verification Process Yes Signs or symptoms of abuse/neglect No Completed: since last visito Patient Requires Transmission- No Hospitalized since last visit: No Based Precautions: Has Dressing in Place as Prescribed: Yes Patient Has Alerts: Yes Has Footwear/Offloading in Place as Yes Patient Alerts: Patient on Blood Prescribed: Thinner Left: Surgical Shoe with ASA Pressure Relief Insole Right: Surgical Shoe with Pressure Relief Insole Pain Present Now: No Electronic Signature(s) Signed: 09/05/2016 5:11:59 PM By: Regan Lemming BSN, RN Entered By: Regan Lemming on 09/05/2016 08:05:58 [name] ([id number]) -------------------------------------------------------------------------------- Encounter Discharge Information Details Patient Name: [name], [name]. Date of Service: 09/05/2016 8:00 AM Medical Record Number: [id number] Patient Account Number: [id number] Date of Birth/Sex: 05-23-1929 (82 y.o. Male) Treating RN: Baruch Gouty, RN, BSN, [place] Primary Care Physician: Unk Pinto Other  Clinician: Referring Physician: Unk Pinto Treating Physician/Extender: Frann Rider in Treatment: 5 Encounter Discharge Information Items Discharge Pain Level: 0 Discharge Condition: Stable Ambulatory Status: Wheelchair Discharge Destination: Home Transportation: Private Auto Accompanied By: son Schedule Follow-up Appointment: No Medication Reconciliation completed and provided to Patient/Care No Roberto Hlavaty: Provided on Clinical Summary of Care: 09/05/2016 Form Type Recipient Paper Patient TC Electronic Signature(s) Signed: 09/05/2016 9:01:37 AM By: Ruthine Dose Entered By: Ruthine Dose on 09/05/2016 09:01:36 [name] ([id number]) -------------------------------------------------------------------------------- Lower Extremity Assessment Details Patient Name: [name] Date of Service: 09/05/2016 8:00 AM Medical Record Number: [id number] Patient Account Number: [id number] Date of Birth/Sex: 19-Jan-1929 (82 y.o. Male) Treating RN: Baruch Gouty, RN, BSN, [place] Primary Care Physician: Unk Pinto Other Clinician: Referring Physician: Unk Pinto Treating Physician/Extender: Frann Rider in Treatment: 5 Vascular Assessment Pulses: Posterior Tibial Dorsalis Pedis Palpable: [Right:Yes] Extremity colors, hair growth, and conditions: Extremity Color: [Right:Mottled] Hair Growth on Extremity: [Right:No] Temperature of Extremity: [Right:Warm] Capillary Refill: [Right:< 3 seconds] Toe Nail Assessment Left: Right: Thick: Yes Discolored: Yes Deformed: No Improper Length and Hygiene: Yes Electronic Signature(s) Signed: 09/05/2016 5:11:59 PM By: Regan Lemming BSN, RN Entered By: Regan Lemming on 09/05/2016 08:06:37 [name] ([id number]) -------------------------------------------------------------------------------- Multi Wound Chart Details Patient Name: [name] Date of Service: 09/05/2016 8:00 AM Medical Record Number:  [id number] Patient Account Number: [id number] Date of Birth/Sex: 1928-12-05 (82 y.o. Male) Treating RN: Baruch Gouty, RN, BSN, [place] Primary Care Physician: Unk Pinto Other Clinician: Referring Physician: Unk Pinto Treating Physician/Extender: Frann Rider in Treatment: 5 Vital Signs Height(in): 69 Pulse(bpm): 96 Weight(lbs): 259 Blood Pressure 138/69 (mmHg): Body Mass Index(BMI): 38 Temperature(F): 98.7 Respiratory Rate 17 (breaths/min): Photos: [1:No Photos] [2:No Photos] [N/A:N/A] Wound Location: [1:Right Malleolus - Lateral] [2:Right Toe Great - Medial] [N/A:N/A] Wounding Event: [1:Gradually Appeared] [2:Gradually Appeared] [N/A:N/A] Primary Etiology: [1:Venous Leg Ulcer] [2:Pressure Ulcer] [N/A:N/A] Comorbid History: [1:Cataracts, Arrhythmia, Coronary Artery Disease, Hypertension, Gout, Osteoarthritis] [2:Cataracts, Arrhythmia, Coronary Artery Disease, Hypertension, Gout, Osteoarthritis] [N/A:N/A] Date  Acquired: [1:06/30/2016] [2:07/03/2016] [N/A:N/A] Weeks of Treatment: [1:5] [2:5] [N/A:N/A] Wound Status: [1:Open] [2:Open] [N/A:N/A] Measurements L x W x D 2x2x0.2 [2:3.5x2.5x0.2] [N/A:N/A] (cm) Area (cm) : [1:3.142] [2:6.872] [N/A:N/A] Volume (cm) : [1:0.628] [2:1.374] [N/A:N/A] % Reduction in Area: [1:20.00%] [2:-3406.10%] [N/A:N/A] % Reduction in Volume: 20.00% [2:-6770.00%] [N/A:N/A] Classification: [1:Full Thickness Without Exposed Support Structures] [2:Category/Stage II] [N/A:N/A] Exudate Amount: [1:Medium] [2:Large] [N/A:N/A] Exudate Type: [1:Serosanguineous] [2:Serosanguineous] [N/A:N/A] Exudate Color: [1:red, brown] [2:red, brown] [N/A:N/A] Foul Odor After [1:Yes] [2:Yes] [N/A:N/A] Cleansing: Odor Anticipated Due to No [2:No] [N/A:N/A] Product Use: Wound Margin: [1:Indistinct, nonvisible] [2:Distinct, outline attached N/A] Granulation Amount: [1:Medium (34-66%)] [2:Medium (34-66%)] [N/A:N/A] Granulation Quality: [1:Pink, Pale] [2:Pink, Pale]  [N/A:N/A] Necrotic Amount: Medium (34-66%) Medium (34-66%) N/A Exposed Structures: Fascia: No Fascia: No N/A Fat: No Fat: No Tendon: No Tendon: No Muscle: No Muscle: No Joint: No Joint: No Bone: No Bone: No Limited to Skin Limited to Skin Breakdown Breakdown Epithelialization: None None N/A Periwound Skin Texture: Edema: Yes Edema: Yes N/A Excoriation: No Excoriation: No Induration: No Induration: No Callus: No Callus: No Crepitus: No Crepitus: No Fluctuance: No Fluctuance: No Friable: No Friable: No Rash: No Rash: No Scarring: No Scarring: No Periwound Skin Maceration: Yes Maceration: Yes N/A Moisture: Moist: Yes Moist: Yes Dry/Scaly: No Dry/Scaly: No Periwound Skin Color: Atrophie Blanche: No Atrophie Blanche: No N/A Cyanosis: No Cyanosis: No Ecchymosis: No Ecchymosis: No Erythema: No Erythema: No Hemosiderin Staining: No Hemosiderin Staining: No Mottled: No Mottled: No Pallor: No Pallor: No Rubor: No Rubor: No Temperature: No Abnormality No Abnormality N/A Tenderness on Yes Yes N/A Palpation: Wound Preparation: Ulcer Cleansing: Ulcer Cleansing: N/A Rinsed/Irrigated with Rinsed/Irrigated with Saline Saline Topical Anesthetic Topical Anesthetic Applied: Other: lidocaine Applied: Other: lidocaine 4% 4% Treatment Notes Electronic Signature(s) Signed: 09/05/2016 5:11:59 PM By: Regan Lemming BSN, RN Entered By: Regan Lemming on 09/05/2016 08:14:09 [name] ([id number]) -------------------------------------------------------------------------------- [place] Details Patient Name: [name], [name]. Date of Service: 09/05/2016 8:00 AM Medical Record Number: [id number] Patient Account Number: [id number] Date of Birth/Sex: 01-May-1929 (82 y.o. Male) Treating RN: Baruch Gouty, RN, BSN, [place] Primary Care Physician: Unk Pinto Other Clinician: Referring Physician: Unk Pinto Treating Physician/Extender: Frann Rider in Treatment: 5 Active Inactive Electronic Signature(s) Signed: 09/25/2016 12:37:55 PM By: Gretta Cool RN, BSN, Kim RN, BSN Signed: 11/07/2016 5:30:14 PM By: Regan Lemming BSN, RN Previous Signature: 09/05/2016 5:11:59 PM Version By: Regan Lemming BSN, RN Entered By: Gretta Cool, RN, BSN, Kim on 09/25/2016 12:37:55 [name], [name] ([id number]) -------------------------------------------------------------------------------- Pain Assessment Details Patient Name: [name], [name]. Date of Service: 09/05/2016 8:00 AM Medical Record Number: [id number] Patient Account Number: [id number] Date of Birth/Sex: [birth year] (82 y.o. Male) Treating RN: Baruch Gouty, RN, BSN, [place] Primary Care Physician: Unk Pinto Other Clinician: Referring Physician: Unk Pinto Treating Physician/Extender: Frann Rider in Treatment: 5 Active Problems Location of Pain Severity and Description of Pain Patient Has Paino No Site Locations With Dressing Change: No Pain Management and Medication Current Pain Management: Electronic Signature(s) Signed: 09/05/2016 5:11:59 PM By: Regan Lemming BSN, RN Entered By: Regan Lemming on 09/05/2016 08:06:13 [name] ([id number]) -------------------------------------------------------------------------------- Patient/Caregiver Education Details Patient Name: TYJON, BOWEN. Date of Service: 09/05/2016 8:00 AM Medical Record Number: [id number] Patient Account Number: [id number] Date of Birth/Gender: Nov 11, 1928 (82 y.o. Male) Treating RN: Baruch Gouty, RN, BSN, [place] Primary Care Physician: Unk Pinto Other Clinician: Referring Physician: Unk Pinto Treating Physician/Extender: Frann Rider in Treatment: 5 Education Assessment Education Provided To: Patient Education Topics Provided Basic Hygiene: Methods: Explain/Verbal Responses: State content  correctly Venous: Methods: Explain/Verbal Responses: State content correctly Welcome To [place]: Methods: Explain/Verbal Wound/Skin Impairment: Methods: Explain/Verbal Responses: State content correctly Electronic Signature(s) Signed: 09/05/2016 5:11:59 PM By: Regan Lemming BSN, RN Entered By: Regan Lemming on 09/05/2016 09:01:30 [name] ([id number]) -------------------------------------------------------------------------------- Wound Assessment Details Patient Name: [name], [name]. Date of Service: 09/05/2016 8:00 AM Medical Record Number: [id number] Patient Account Number: [id number] Date of Birth/Sex: [birth year] (82 y.o. Male) Treating RN: Baruch Gouty, RN, BSN, [place] Primary Care Physician: Unk Pinto Other Clinician: Referring Physician: Unk Pinto Treating Physician/Extender: Frann Rider in Treatment: 5 Wound Status Wound Number: 1 Primary Venous Leg Ulcer Etiology: Wound Location: Right Malleolus - Lateral Wound Open Wounding Event: Gradually Appeared Status: Date Acquired: 06/30/2016 Comorbid Cataracts, Arrhythmia, Coronary Artery Weeks Of Treatment: 5 History: Disease, Hypertension, Gout, Clustered Wound: No Osteoarthritis Photos Photo Uploaded By: Regan Lemming on 09/05/2016 16:57:23 Wound Measurements Length: (cm) 2 Width: (cm) 2 Depth: (cm) 0.2 Area: (cm) 3.142 Volume: (cm) 0.628 % Reduction in Area: 20% % Reduction in Volume: 20% Epithelialization: None Tunneling: No Undermining: No Wound Description Full Thickness Without Exposed Classification: Support Structures Wound Margin: Indistinct, nonvisible Exudate Medium Amount: Exudate Type: Serosanguineous Exudate Color: red, brown Foul Odor After Cleansing: Yes Due to Product Use: No Wound Bed Granulation Amount: Medium (34-66%) Exposed Structure Granulation Quality: Pink, Pale Fascia Exposed: No Necrotic Amount: Medium (34-66%) Fat Layer Exposed: No Necrotic Quality: Adherent Slough Tendon Exposed: No [name], [name] ([id number]) Muscle  Exposed: No Joint Exposed: No Bone Exposed: No Limited to Skin Breakdown Periwound Skin Texture Texture Color No Abnormalities Noted: No No Abnormalities Noted: No Callus: No Atrophie Blanche: No Crepitus: No Cyanosis: No Excoriation: No Ecchymosis: No Fluctuance: No Erythema: No Friable: No Hemosiderin Staining: No Induration: No Mottled: No Localized Edema: Yes Pallor: No Rash: No Rubor: No Scarring: No Temperature / Pain Moisture Temperature: No Abnormality No Abnormalities Noted: No Tenderness on Palpation: Yes Dry / Scaly: No Maceration: Yes Moist: Yes Wound Preparation Ulcer Cleansing: Rinsed/Irrigated with Saline Topical Anesthetic Applied: Other: lidocaine 4%, Electronic Signature(s) Signed: 09/05/2016 5:11:59 PM By: Regan Lemming BSN, RN Entered By: Regan Lemming on 09/05/2016 08:12:49 [name], [name] ([id number]) -------------------------------------------------------------------------------- Wound Assessment Details Patient Name: [name], [name]. Date of Service: 09/05/2016 8:00 AM Medical Record Number: [id number] Patient Account Number: [id number] Date of Birth/Sex: 1928-11-23 (82 y.o. Male) Treating RN: Baruch Gouty, RN, BSN, [place] Primary Care Physician: Unk Pinto Other Clinician: Referring Physician: Unk Pinto Treating Physician/Extender: Frann Rider in Treatment: 5 Wound Status Wound Number: 2 Primary Pressure Ulcer Etiology: Wound Location: Right Toe Great - Medial Wound Open Wounding Event: Gradually Appeared Status: Date Acquired: 07/03/2016 Comorbid Cataracts, Arrhythmia, Coronary Artery Weeks Of Treatment: 5 History: Disease, Hypertension, Gout, Clustered Wound: No Osteoarthritis Photos Photo Uploaded By: Regan Lemming on 09/05/2016 16:57:24 Wound Measurements Length: (cm) 3.5 Width: (cm) 2.5 Depth: (cm) 0.2 Area: (cm) 6.872 Volume: (cm) 1.374 % Reduction in Area: -3406.1% % Reduction in Volume:  -6770% Epithelialization: None Tunneling: No Undermining: No Wound Description Classification: Category/Stage IV Foul Odor Af Wound Margin: Distinct, outline attached Due to Produ Exudate Amount: Large Exudate Type: Serosanguineous Exudate Color: red, brown ter Cleansing: Yes ct Use: No Wound Bed Granulation Amount: Medium (34-66%) Exposed Structure Granulation Quality: Pink, Pale Fascia Exposed: No Necrotic Amount: Medium (34-66%) Fat Layer Exposed: No Necrotic Quality: Adherent Slough Tendon Exposed: No Muscle Exposed: No Joint Exposed: No [name], [name] ([id number]) Bone Exposed: Yes Periwound Skin Texture Texture Color No Abnormalities Noted:  No No Abnormalities Noted: No Callus: No Atrophie Blanche: No Crepitus: No Cyanosis: No Excoriation: No Ecchymosis: No Fluctuance: No Erythema: No Friable: No Hemosiderin Staining: No Induration: No Mottled: No Localized Edema: Yes Pallor: No Rash: No Rubor: No Scarring: No Temperature / Pain Moisture Temperature: No Abnormality No Abnormalities Noted: No Tenderness on Palpation: Yes Dry / Scaly: No Maceration: Yes Moist: Yes Wound Preparation Ulcer Cleansing: Rinsed/Irrigated with Saline Topical Anesthetic Applied: Other: lidocaine 4%, Electronic Signature(s) Signed: 09/05/2016 5:11:59 PM By: Regan Lemming BSN, RN Entered By: Regan Lemming on 09/05/2016 08:43:05 [name], [name] ([id number]) -------------------------------------------------------------------------------- Vitals Details Patient Name: [name] Date of Service: 09/05/2016 8:00 AM Medical Record Number: [id number] Patient Account Number: [id number] Date of Birth/Sex: 1929-05-12 (82 y.o. Male) Treating RN: Baruch Gouty, RN, BSN, [place] Primary Care Physician: Unk Pinto Other Clinician: Referring Physician: Unk Pinto Treating Physician/Extender: Frann Rider in Treatment: 5 Vital Signs Time Taken: 08:08 Temperature (F):  98.7 Height (in): 69 Pulse (bpm): 96 Weight (lbs): 259 Respiratory Rate (breaths/min): 17 Body Mass Index (BMI): 38.2 Blood Pressure (mmHg): 138/69 Reference Range: 80 - 120 mg / dl Electronic Signature(s) Signed: 09/05/2016 5:11:59 PM By: Regan Lemming BSN, RN Entered By: Regan Lemming on 09/05/2016 26:94:85

## 2016-09-07 ENCOUNTER — Emergency Department (HOSPITAL_COMMUNITY): Payer: Medicare Other

## 2016-09-07 ENCOUNTER — Inpatient Hospital Stay (HOSPITAL_COMMUNITY): Payer: Medicare Other

## 2016-09-07 ENCOUNTER — Inpatient Hospital Stay (HOSPITAL_COMMUNITY)
Admission: EM | Admit: 2016-09-07 | Discharge: 2016-09-12 | DRG: 629 | Disposition: A | Discharge status: Skilled Nursing Facility | Payer: Medicare Other | Attending: Internal Medicine | Admitting: Internal Medicine

## 2016-09-07 ENCOUNTER — Encounter (HOSPITAL_COMMUNITY): Payer: Self-pay

## 2016-09-07 DIAGNOSIS — E11621 Type 2 diabetes mellitus with foot ulcer: Secondary | ICD-10-CM | POA: Diagnosis present

## 2016-09-07 DIAGNOSIS — E1165 Type 2 diabetes mellitus with hyperglycemia: Secondary | ICD-10-CM | POA: Diagnosis present

## 2016-09-07 DIAGNOSIS — L97319 Non-pressure chronic ulcer of right ankle with unspecified severity: Secondary | ICD-10-CM | POA: Diagnosis present

## 2016-09-07 DIAGNOSIS — Z974 Presence of external hearing-aid: Secondary | ICD-10-CM

## 2016-09-07 DIAGNOSIS — E1169 Type 2 diabetes mellitus with other specified complication: Principal | ICD-10-CM | POA: Diagnosis present

## 2016-09-07 DIAGNOSIS — M2011 Hallux valgus (acquired), right foot: Secondary | ICD-10-CM | POA: Diagnosis present

## 2016-09-07 DIAGNOSIS — Z905 Acquired absence of kidney: Secondary | ICD-10-CM

## 2016-09-07 DIAGNOSIS — H918X3 Other specified hearing loss, bilateral: Secondary | ICD-10-CM | POA: Diagnosis present

## 2016-09-07 DIAGNOSIS — Z981 Arthrodesis status: Secondary | ICD-10-CM

## 2016-09-07 DIAGNOSIS — Z87891 Personal history of nicotine dependence: Secondary | ICD-10-CM

## 2016-09-07 DIAGNOSIS — E871 Hypo-osmolality and hyponatremia: Secondary | ICD-10-CM | POA: Diagnosis present

## 2016-09-07 DIAGNOSIS — Z955 Presence of coronary angioplasty implant and graft: Secondary | ICD-10-CM

## 2016-09-07 DIAGNOSIS — Z79899 Other long term (current) drug therapy: Secondary | ICD-10-CM

## 2016-09-07 DIAGNOSIS — E1151 Type 2 diabetes mellitus with diabetic peripheral angiopathy without gangrene: Secondary | ICD-10-CM | POA: Diagnosis present

## 2016-09-07 DIAGNOSIS — G894 Chronic pain syndrome: Secondary | ICD-10-CM

## 2016-09-07 DIAGNOSIS — N184 Chronic kidney disease, stage 4 (severe): Secondary | ICD-10-CM | POA: Diagnosis present

## 2016-09-07 DIAGNOSIS — D638 Anemia in other chronic diseases classified elsewhere: Secondary | ICD-10-CM | POA: Diagnosis present

## 2016-09-07 DIAGNOSIS — C649 Malignant neoplasm of unspecified kidney, except renal pelvis: Secondary | ICD-10-CM | POA: Diagnosis present

## 2016-09-07 DIAGNOSIS — Z923 Personal history of irradiation: Secondary | ICD-10-CM

## 2016-09-07 DIAGNOSIS — I10 Essential (primary) hypertension: Secondary | ICD-10-CM

## 2016-09-07 DIAGNOSIS — L97513 Non-pressure chronic ulcer of other part of right foot with necrosis of muscle: Secondary | ICD-10-CM | POA: Diagnosis not present

## 2016-09-07 DIAGNOSIS — Z7982 Long term (current) use of aspirin: Secondary | ICD-10-CM

## 2016-09-07 DIAGNOSIS — E11622 Type 2 diabetes mellitus with other skin ulcer: Secondary | ICD-10-CM | POA: Diagnosis present

## 2016-09-07 DIAGNOSIS — M1000 Idiopathic gout, unspecified site: Secondary | ICD-10-CM

## 2016-09-07 DIAGNOSIS — L02611 Cutaneous abscess of right foot: Secondary | ICD-10-CM | POA: Diagnosis present

## 2016-09-07 DIAGNOSIS — Q6621 Congenital metatarsus primus varus: Secondary | ICD-10-CM | POA: Diagnosis not present

## 2016-09-07 DIAGNOSIS — L03031 Cellulitis of right toe: Secondary | ICD-10-CM | POA: Diagnosis not present

## 2016-09-07 DIAGNOSIS — M109 Gout, unspecified: Secondary | ICD-10-CM | POA: Diagnosis present

## 2016-09-07 DIAGNOSIS — Z9889 Other specified postprocedural states: Secondary | ICD-10-CM

## 2016-09-07 DIAGNOSIS — Z85528 Personal history of other malignant neoplasm of kidney: Secondary | ICD-10-CM

## 2016-09-07 DIAGNOSIS — M86171 Other acute osteomyelitis, right ankle and foot: Secondary | ICD-10-CM | POA: Diagnosis present

## 2016-09-07 DIAGNOSIS — Z809 Family history of malignant neoplasm, unspecified: Secondary | ICD-10-CM

## 2016-09-07 DIAGNOSIS — E782 Mixed hyperlipidemia: Secondary | ICD-10-CM | POA: Diagnosis present

## 2016-09-07 DIAGNOSIS — C7801 Secondary malignant neoplasm of right lung: Secondary | ICD-10-CM | POA: Diagnosis present

## 2016-09-07 DIAGNOSIS — Z9119 Patient's noncompliance with other medical treatment and regimen: Secondary | ICD-10-CM

## 2016-09-07 DIAGNOSIS — Z96653 Presence of artificial knee joint, bilateral: Secondary | ICD-10-CM | POA: Diagnosis present

## 2016-09-07 DIAGNOSIS — N179 Acute kidney failure, unspecified: Secondary | ICD-10-CM | POA: Diagnosis present

## 2016-09-07 DIAGNOSIS — I129 Hypertensive chronic kidney disease with stage 1 through stage 4 chronic kidney disease, or unspecified chronic kidney disease: Secondary | ICD-10-CM | POA: Diagnosis present

## 2016-09-07 DIAGNOSIS — L97518 Non-pressure chronic ulcer of other part of right foot with other specified severity: Secondary | ICD-10-CM | POA: Diagnosis present

## 2016-09-07 DIAGNOSIS — E039 Hypothyroidism, unspecified: Secondary | ICD-10-CM | POA: Diagnosis present

## 2016-09-07 DIAGNOSIS — I2510 Atherosclerotic heart disease of native coronary artery without angina pectoris: Secondary | ICD-10-CM | POA: Diagnosis present

## 2016-09-07 DIAGNOSIS — E669 Obesity, unspecified: Secondary | ICD-10-CM | POA: Diagnosis present

## 2016-09-07 DIAGNOSIS — K219 Gastro-esophageal reflux disease without esophagitis: Secondary | ICD-10-CM | POA: Diagnosis present

## 2016-09-07 DIAGNOSIS — C642 Malignant neoplasm of left kidney, except renal pelvis: Secondary | ICD-10-CM

## 2016-09-07 DIAGNOSIS — E1122 Type 2 diabetes mellitus with diabetic chronic kidney disease: Secondary | ICD-10-CM | POA: Diagnosis present

## 2016-09-07 DIAGNOSIS — L03115 Cellulitis of right lower limb: Secondary | ICD-10-CM | POA: Diagnosis present

## 2016-09-07 DIAGNOSIS — R52 Pain, unspecified: Secondary | ICD-10-CM

## 2016-09-07 DIAGNOSIS — E785 Hyperlipidemia, unspecified: Secondary | ICD-10-CM | POA: Diagnosis present

## 2016-09-07 DIAGNOSIS — L97509 Non-pressure chronic ulcer of other part of unspecified foot with unspecified severity: Secondary | ICD-10-CM

## 2016-09-07 DIAGNOSIS — M869 Osteomyelitis, unspecified: Secondary | ICD-10-CM

## 2016-09-07 LAB — CBC AND DIFFERENTIAL
HEMATOCRIT: 29 % — AB (ref 41–53)
HEMOGLOBIN: 9.5 g/dL — AB (ref 13.5–17.5)
Platelets: 241 10*3/uL (ref 150–399)
WBC: 9.7 10*3/mL

## 2016-09-07 LAB — CBC WITH DIFFERENTIAL/PLATELET
Basophils Absolute: 0.1 10*3/uL (ref 0.0–0.1)
Basophils Relative: 1 %
EOS ABS: 0.2 10*3/uL (ref 0.0–0.7)
Eosinophils Relative: 2 %
HEMATOCRIT: 29.4 % — AB (ref 39.0–52.0)
HEMOGLOBIN: 9.5 g/dL — AB (ref 13.0–17.0)
LYMPHS ABS: 1.1 10*3/uL (ref 0.7–4.0)
LYMPHS PCT: 11 %
MCH: 31.1 pg (ref 26.0–34.0)
MCHC: 32.3 g/dL (ref 30.0–36.0)
MCV: 96.4 fL (ref 78.0–100.0)
MONOS PCT: 11 %
Monocytes Absolute: 1.1 10*3/uL — ABNORMAL HIGH (ref 0.1–1.0)
NEUTROS PCT: 75 %
Neutro Abs: 7.3 10*3/uL (ref 1.7–7.7)
Platelets: 241 10*3/uL (ref 150–400)
RBC: 3.05 MIL/uL — AB (ref 4.22–5.81)
RDW: 13.9 % (ref 11.5–15.5)
WBC: 9.7 10*3/uL (ref 4.0–10.5)

## 2016-09-07 LAB — COMPREHENSIVE METABOLIC PANEL
ALBUMIN: 2.7 g/dL — AB (ref 3.5–5.0)
ALK PHOS: 77 U/L (ref 38–126)
ALT: 17 U/L (ref 17–63)
ANION GAP: 9 (ref 5–15)
AST: 22 U/L (ref 15–41)
BUN: 51 mg/dL — ABNORMAL HIGH (ref 6–20)
CALCIUM: 8.7 mg/dL — AB (ref 8.9–10.3)
CHLORIDE: 101 mmol/L (ref 101–111)
CO2: 22 mmol/L (ref 22–32)
Creatinine, Ser: 2.9 mg/dL — ABNORMAL HIGH (ref 0.61–1.24)
GFR calc non Af Amer: 18 mL/min — ABNORMAL LOW (ref >60)
GFR, EST AFRICAN AMERICAN: 21 mL/min — AB (ref >60)
GLUCOSE: 150 mg/dL — AB (ref 65–99)
Potassium: 4.4 mmol/L (ref 3.5–5.1)
SODIUM: 132 mmol/L — AB (ref 135–145)
Total Bilirubin: 0.5 mg/dL (ref 0.3–1.2)
Total Protein: 6.9 g/dL (ref 6.5–8.1)

## 2016-09-07 LAB — BASIC METABOLIC PANEL
BUN: 51 mg/dL — AB (ref 4–21)
Creatinine: 2.9 mg/dL — AB (ref 0.6–1.3)
GLUCOSE: 150 mg/dL
Potassium: 4.4 mmol/L (ref 3.4–5.3)
Sodium: 132 mmol/L — AB (ref 137–147)

## 2016-09-07 LAB — HEPATIC FUNCTION PANEL
ALT: 17 U/L (ref 10–40)
AST: 22 U/L (ref 14–40)
Alkaline Phosphatase: 77 U/L (ref 25–125)
BILIRUBIN, TOTAL: 0.5 mg/dL

## 2016-09-07 LAB — I-STAT CG4 LACTIC ACID, ED: Lactic Acid, Venous: 1.63 mmol/L (ref 0.5–1.9)

## 2016-09-07 MED ORDER — AMLODIPINE BESYLATE 5 MG PO TABS
5.0000 mg | ORAL_TABLET | Freq: Every day | ORAL | Status: DC
  Administered 2016-09-08 – 2016-09-09 (×2): 5 mg via ORAL
  Filled 2016-09-07 (×2): qty 1

## 2016-09-07 MED ORDER — VITAMIN D 1000 UNITS PO TABS
2000.0000 [IU] | ORAL_TABLET | Freq: Every day | ORAL | Status: DC
  Administered 2016-09-08 – 2016-09-12 (×5): 2000 [IU] via ORAL
  Filled 2016-09-07 (×5): qty 2

## 2016-09-07 MED ORDER — ACETAMINOPHEN 650 MG RE SUPP: 650.0000 mg | Freq: Four times a day (QID) | RECTAL | Status: DC | PRN

## 2016-09-07 MED ORDER — VANCOMYCIN HCL 10 G IV SOLR
2000.0000 mg | Freq: Once | INTRAVENOUS | Status: AC
  Administered 2016-09-07: 2000 mg via INTRAVENOUS
  Filled 2016-09-07: qty 2000

## 2016-09-07 MED ORDER — SODIUM CHLORIDE 0.9% FLUSH
3.0000 mL | Freq: Two times a day (BID) | INTRAVENOUS | Status: DC
  Administered 2016-09-09 – 2016-09-12 (×6): 3 mL via INTRAVENOUS

## 2016-09-07 MED ORDER — LEVOTHYROXINE SODIUM 75 MCG PO TABS: 225.0000 ug | ORAL_TABLET | ORAL | Status: DC

## 2016-09-07 MED ORDER — PRAVASTATIN SODIUM 40 MG PO TABS
40.0000 mg | ORAL_TABLET | Freq: Every day | ORAL | Status: DC
  Administered 2016-09-07 – 2016-09-11 (×4): 40 mg via ORAL
  Filled 2016-09-07 (×4): qty 1

## 2016-09-07 MED ORDER — HEPARIN SODIUM (PORCINE) 5000 UNIT/ML IJ SOLN
5000.0000 [IU] | Freq: Three times a day (TID) | INTRAMUSCULAR | Status: DC
  Administered 2016-09-07 – 2016-09-12 (×10): 5000 [IU] via SUBCUTANEOUS
  Filled 2016-09-07 (×10): qty 1

## 2016-09-07 MED ORDER — NITROGLYCERIN 0.4 MG SL SUBL: 0.4000 mg | SUBLINGUAL_TABLET | SUBLINGUAL | Status: DC | PRN

## 2016-09-07 MED ORDER — VANCOMYCIN HCL 10 G IV SOLR
1500.0000 mg | INTRAVENOUS | Status: DC
  Administered 2016-09-09: 1500 mg via INTRAVENOUS
  Filled 2016-09-07 (×2): qty 1500

## 2016-09-07 MED ORDER — VITAMIN C 500 MG PO TABS
500.0000 mg | ORAL_TABLET | Freq: Two times a day (BID) | ORAL | Status: DC
  Administered 2016-09-07 – 2016-09-12 (×9): 500 mg via ORAL
  Filled 2016-09-07 (×9): qty 1

## 2016-09-07 MED ORDER — ACETAMINOPHEN 325 MG PO TABS: 650.0000 mg | ORAL_TABLET | Freq: Four times a day (QID) | ORAL | Status: DC | PRN

## 2016-09-07 MED ORDER — TRAMADOL HCL 50 MG PO TABS
50.0000 mg | ORAL_TABLET | Freq: Four times a day (QID) | ORAL | Status: DC | PRN
  Administered 2016-09-09: 50 mg via ORAL
  Filled 2016-09-07: qty 1

## 2016-09-07 MED ORDER — SENNOSIDES-DOCUSATE SODIUM 8.6-50 MG PO TABS
1.0000 | ORAL_TABLET | Freq: Every evening | ORAL | Status: DC | PRN
Start: 2016-09-07 — End: 2016-09-12

## 2016-09-07 MED ORDER — ADULT MULTIVITAMIN W/MINERALS CH
1.0000 | ORAL_TABLET | Freq: Every day | ORAL | Status: DC
  Administered 2016-09-08 – 2016-09-12 (×5): 1 via ORAL
  Filled 2016-09-07 (×5): qty 1

## 2016-09-07 MED ORDER — ASPIRIN EC 81 MG PO TBEC
81.0000 mg | DELAYED_RELEASE_TABLET | Freq: Every day | ORAL | Status: DC
  Administered 2016-09-08 – 2016-09-12 (×5): 81 mg via ORAL
  Filled 2016-09-07 (×6): qty 1

## 2016-09-07 MED ORDER — LEVOTHYROXINE SODIUM 75 MCG PO TABS
150.0000 ug | ORAL_TABLET | ORAL | Status: DC
  Administered 2016-09-08 – 2016-09-12 (×5): 150 ug via ORAL
  Filled 2016-09-07 (×5): qty 2

## 2016-09-07 MED ORDER — SODIUM CHLORIDE 0.9 % IV SOLN
INTRAVENOUS | Status: DC
  Administered 2016-09-07 – 2016-09-08 (×2): via INTRAVENOUS

## 2016-09-07 MED ORDER — FERROUS SULFATE 325 (65 FE) MG PO TABS
325.0000 mg | ORAL_TABLET | Freq: Two times a day (BID) | ORAL | Status: DC
  Administered 2016-09-08 – 2016-09-12 (×8): 325 mg via ORAL
  Filled 2016-09-07 (×8): qty 1

## 2016-09-07 NOTE — ED Notes (Addendum)
1st set of cultures in mini-lab if needed

## 2016-09-07 NOTE — Progress Notes (Signed)
Pharmacy Antibiotic Note  [name] is a 82 y.o. male admitted on 09/07/2016 with a persistent R-foot infection/cellulitis. Pharmacy has been consulted for Vancomycin dosing. SCr 2.9, CrCl~15-20 ml/min (normalized)  No antibiotics have been given thus far in the ED.  Plan: 1. Vancomycin 2g IV x 1 dose to load 2. Vancomycin 1500 mg IV every 48 hours starting on 10/31 3. Will continue to follow renal function, culture results, LOT, and antibiotic de-escalation plans   Height: '5\' 9"'$  (175.3 cm) Weight: 254 lb (115.2 kg) IBW/kg (Calculated) : 70.7  Temp (24hrs), Avg:98 F (36.7 C), Min:98 F (36.7 C), Max:98 F (36.7 C)   Recent Labs Lab 09/07/16 1603 09/07/16 1608  WBC  --  9.7  CREATININE  --  2.90*  LATICACIDVEN 1.63  --     Estimated Creatinine Clearance: 23.3 mL/min (by C-G formula based on SCr of 2.9 mg/dL (H)).    No Known Allergies  Antimicrobials this admission: Vanc 10/29 >>  Dose adjustments this admission:   Microbiology results: 10/29 BCx >>  Thank you for allowing pharmacy to be a part of this patient's care.  Alycia Rossetti, PharmD, BCPS Clinical Pharmacist Pager: [phone] 09/07/2016 5:10 PM

## 2016-09-07 NOTE — ED Notes (Signed)
Patient transported to X-ray 

## 2016-09-07 NOTE — Progress Notes (Signed)
Called to get report; spoke with Nicki Reaper, RN in the Ed.

## 2016-09-07 NOTE — ED Triage Notes (Signed)
Patient complains of right foot infection that has been ongoing for 2 months. Has been seen at wound clinic for same and now wound is bigger and appears infected per family, patient denies pain

## 2016-09-07 NOTE — H&P (Signed)
History and Physical    ORLAND VISCONTI QTM:[id number] DOB: 02/27/1929 DOA: 09/07/2016  PCP: Alesia Richards, MD   Patient coming from: Podiatry Office  Chief Complaint: Right Foot Pain and Non-Healing Ulcer  HPI: [name] is a 82 y.o. male with medical history significant of HTN, HLD, Gout, Renal Cell Carcinoma s/p L Nephrectomy with Mets to Lung s/p Radiation Tx, IDA and Anemia of Chronic Disease, CKD Stage 3-4, Difficulty Hearing, GERD, CAD, and other comorbids who presents to the ER with a non-healing medial Right 1st MP joint and Lateral Ankle Ulcer. Patient has been seeing the wound clinic and states it has gotten worse in the last week with increased pain, swelling, and tenderness to the touch. He states the ulcer started 3 months ago, and has progressively gotten worse in the last week. He has been on outpatient Abx with Doxycycline and Ciprofloxacin and has not had improvement. Patient admits to increase redness and swelling in foot and has had ABI testing as an out patient. No other complaints or concerns except pain.  ED Course: Patient had a Foot X-Ray, basic Labwork and placed on Renally dosed Vancomycin.  Review of Systems: As per HPI otherwise 10 point review of systems negative. Denies fevers, chills, nightsweats, chest pain, shortness of breath, nausea, vomiting, lightheadedness, dizziness, urinary discomfort or muscle weakness.  Past Medical History:  Diagnosis Date  . Anemia   . Arthritis    stenosis - back, neck  . Cancer [facility])    renal neoplasm  . Chronic kidney disease    renalCa- nephrectomy [facility]) 04/2012  . Coronary artery disease   . GERD (gastroesophageal reflux disease)   . Hard of hearing   . Hearing deficit    hearing aids- bilateral  . Hypertension    sees Dr. Wynonia Lawman, stress test recently for prep for surgery  . Hypothyroidism   . Lung cancer ([place])    right lower lobe nodule suspicious for metastatic clear cell renal cell neoplasm  .  Prediabetes   . Vitamin D deficiency    Past Surgical History:  Procedure Laterality Date  . ANTERIOR CERVICAL DECOMP/DISCECTOMY FUSION N/A 01/18/2013   Procedure: ANTERIOR CERVICAL DECOMPRESSION/DISCECTOMY FUSION 2 LEVELS;  Surgeon: Faythe Ghee, MD;  Location: [place] NEURO ORS;  Service: Neurosurgery;  Laterality: N/A;  . APPENDECTOMY    . BACK SURGERY     lower back surgery - 1990's  . CHOLECYSTECTOMY    . CORONARY ANGIOPLASTY     stents , + 7-26yr. ago  . EYE SURGERY     right eye cataract surgery   . JOINT REPLACEMENT     bilateral knee replacements   . LAPAROSCOPIC NEPHRECTOMY  04/29/2012   Procedure: LAPAROSCOPIC NEPHRECTOMY;  Surgeon: LDutch Gray MD;  Location: [facility];  Service: Urology;  Laterality: Left;      . TONSILLECTOMY     SOCIAL HISTORY  reports that he quit smoking about 45 years ago. His smoking use included Cigarettes. He has never used smokeless tobacco. He reports that he drinks about 6.6 oz of alcohol per week . He reports that he does not use drugs.  No Known Allergies  Family History  Problem Relation Age of Onset  . Cancer Sister     breast  . Cancer Brother     colon   Prior to Admission medications   Medication Sig Start Date End Date Taking? Authorizing Provider  allopurinol (ZYLOPRIM) 300 MG tablet TAKE 1 TABLET DAILY 11/17/15   WUnk Pinto  MD  amLODipine (NORVASC) 5 MG tablet Take 1 tablet (5 mg total) by mouth daily before breakfast. 08/19/16   Unk Pinto, MD  aspirin EC 81 MG tablet Take 81 mg by mouth daily.    Historical Provider, MD  bumetanide (BUMEX) 2 MG tablet TAKE ONE-HALF (1/2) TO ONE TABLET DAILY FOR FLUID 01/07/16   Unk Pinto, MD  Cholecalciferol (VITAMIN D3) 1000 UNITS CAPS Take 2,000 capsules by mouth daily.     Historical Provider, MD  diclofenac sodium (VOLTAREN) 1 % GEL Apply 4 g topically 4 (four) times daily. 08/22/16   Unk Pinto, MD  FERROUS SULFATE PO Take 1 tablet by mouth 2 (two) times daily.      Historical Provider, MD  levothyroxine (SYNTHROID, LEVOTHROID) 150 MCG tablet Take 1 pill daily but take 1.5 pills on Sunday, take 1 hour before food, only with water. 07/02/16   Unk Pinto, MD  losartan (COZAAR) 50 MG tablet Take 1 tablet (50 mg total) by mouth daily. 05/30/16   Vicie Mutters, PA-C  Magnesium 250 MG TABS Take 500 mg by mouth daily.     Historical Provider, MD  Multiple Vitamin (MULITIVITAMIN WITH MINERALS) TABS Take 1 tablet by mouth daily.    Historical Provider, MD  nitroGLYCERIN (NITROSTAT) 0.4 MG SL tablet Place 0.4 mg under the tongue every 5 (five) minutes as needed for chest pain.     Historical Provider, MD  pravastatin (PRAVACHOL) 40 MG tablet Take 1 tablet (40 mg total) by mouth at bedtime. 05/30/16   Vicie Mutters, PA-C  SANTYL ointment  08/02/16   Historical Provider, MD  traMADol (ULTRAM) 50 MG tablet TAKE 1 TABLET BY MOUTH UP TO 3 TIMES DAILY AS NEEDED FOR SEVERE PAIN 08/04/16   Unk Pinto, MD  vitamin C (ASCORBIC ACID) 500 MG tablet Take 500 mg by mouth 2 (two) times daily.    Historical Provider, MD   Physical Exam: Vitals:   09/07/16 1702 09/07/16 1800 09/07/16 1830 09/07/16 1904  BP:  156/64 162/63 (!) 162/61  Pulse:  78 79 86  Resp:  '18 18 20  '$ Temp:      TempSrc:      SpO2:  96% 98% 100%  Weight: 115.2 kg (254 lb)     Height: '5\' 9"'$  (1.753 m)      Constitutional: WN/WD, NAD and appears calm and comfortable Eyes: Right Eye pupil smaller than Left; lids and conjunctivae normal, sclerae anicteric  ENMT: External Ears, Nose appear normal. Very hard of hearing. Mucous membranes are moist. Posterior pharynx clear of any exudate or lesions. Patient has fake teeth. Scar on cheek from recent Mohs surgery for SCC. Neck: Appears normal, supple, no cervical masses, normal ROM, no appreciable thyromegaly, no JVD Respiratory: Clear to auscultation bilaterally, no wheezing, rales, rhonchi or crackles. Normal respiratory effort and patient is not tachypenic. No  accessory muscle use.  Cardiovascular: RRR, no murmurs / rubs / gallops. S1 and S2 auscultated. 1+ LE edema.  Abdomen: Soft, non-tender, non-distended. No masses palpated. No appreciable hepatosplenomegaly. Bowel sounds positive. Abdominal Bruise noted.  GU: Deferred. Musculoskeletal: No clubbing / cyanosis of digits/nails. No joint deformity upper and lower extremities.  Extremities: Right Medial 1st MP Joint Ulcer with exposed bone and erythema. Right Medial Ankle Ulcer noted. Skin: Diffuse Seborrheic Keratosis noted.  Neurologic: Very hard of hearing. Sensation intact in all 4 Extremities, Strength 5/5 in all 4. Romberg sign cerebellar reflexes not assessed.  Psychiatric: Normal judgment and insight. Alert and oriented x 3. Normal  and pleasant mood and appropriate affect.   Labs on Admission: I have personally reviewed following labs and imaging studies  CBC:  Recent Labs Lab 09/07/16 1608  WBC 9.7  NEUTROABS 7.3  HGB 9.5*  HCT 29.4*  MCV 96.4  PLT 323   Basic Metabolic Panel:  Recent Labs Lab 09/07/16 1608  NA 132*  K 4.4  CL 101  CO2 22  GLUCOSE 150*  BUN 51*  CREATININE 2.90*  CALCIUM 8.7*   GFR: Estimated Creatinine Clearance: 23.3 mL/min (by C-G formula based on SCr of 2.9 mg/dL (H)). Liver Function Tests:  Recent Labs Lab 09/07/16 1608  AST 22  ALT 17  ALKPHOS 77  BILITOT 0.5  PROT 6.9  ALBUMIN 2.7*   No results for input(s): LIPASE, AMYLASE in the last 168 hours. No results for input(s): AMMONIA in the last 168 hours. Coagulation Profile: No results for input(s): INR, PROTIME in the last 168 hours. Cardiac Enzymes: No results for input(s): CKTOTAL, CKMB, CKMBINDEX, TROPONINI in the last 168 hours. BNP (last 3 results) No results for input(s): PROBNP in the last 8760 hours. HbA1C: No results for input(s): HGBA1C in the last 72 hours. CBG: No results for input(s): GLUCAP in the last 168 hours. Lipid Profile: No results for input(s): CHOL, HDL,  LDLCALC, TRIG, CHOLHDL, LDLDIRECT in the last 72 hours. Thyroid Function Tests: No results for input(s): TSH, T4TOTAL, FREET4, T3FREE, THYROIDAB in the last 72 hours. Anemia Panel: No results for input(s): VITAMINB12, FOLATE, FERRITIN, TIBC, IRON, RETICCTPCT in the last 72 hours. Urine analysis:    Component Value Date/Time   COLORURINE YELLOW 12/20/2013 1913   APPEARANCEUR CLOUDY (A) 12/20/2013 1913   LABSPEC 1.017 12/20/2013 1913   PHURINE 5.5 12/20/2013 1913   GLUCOSEU NEGATIVE 12/20/2013 1913   HGBUR NEGATIVE 12/20/2013 1913   BILIRUBINUR NEGATIVE 12/20/2013 [place] 12/20/2013 1913   PROTEINUR 30 (A) 12/20/2013 1913   UROBILINOGEN 0.2 12/20/2013 1913   NITRITE NEGATIVE 12/20/2013 1913   LEUKOCYTESUR MODERATE (A) 12/20/2013 1913   Radiological Exams on Admission: Dg Foot Complete Right  Result Date: 09/07/2016 CLINICAL DATA:  Right foot pain, infection EXAM: RIGHT FOOT COMPLETE - 3+ VIEW COMPARISON:  08/07/2016 FINDINGS: No fracture or dislocation is seen. Degenerative changes at the 1st MTP joint with associated hallux valgus deformity. Soft tissue swelling/wound along the medial aspect of the 1st metatarsal head. No definite underlying cortical destruction. Additional mild degenerative changes at the 2nd and 3rd MTP joints. IMPRESSION: Soft tissue swelling/wound along the medial aspect of the 1st metatarsal head. No definite underlying cortical destruction to suggest acute osteomyelitis. Additional degenerative changes, as above. Electronically Signed   By: Julian Hy M.D.   On: 09/07/2016 17:53   EKG: No EKG to review.  Assessment/Plan Principal Problem:   Foot ulcer ([place]) Active Problems:   Cancer of kidney (HCC)   GERD (gastroesophageal reflux disease)   Hypertension   CKD, stage IV (GFR 28 ml/min) (HCC)   Hypothyroidism   Hyperlipidemia   CAD (coronary artery disease), native coronary artery   Obesity (BMI 30-39.9)   Gout   Cellulitis of  right foot   Acute osteomyelitis of right foot ([place])  1. Right 1st Metatarsal Non-Healing Ulcer with likely Cellulitis concern for Osteomyelitis, failed outpatient Therapy and Antibiotics -Patient sees [facility] and states Pain and Ulcer have gotten worse in the last week -Saw Podiatry today Dr. Amalia Hailey and patient was sent over by his office for Evaluation -Foot Xray 09/05/16 at  [facility] showed Significant dorsal soft tissue swelling of uncertain etiology. No acute fracture. Significant hallux valgus deformity. Atherosclerosis. -Foot Xray today showed no fracture or dislocation is seen. Degenerative changes at the 1st MTP joint with associated hallux valgus deformity. Soft tissue swelling/wound along the medial aspect of the 1st metatarsal head. No definite underlying cortical destruction. Additional mild degenerative changes at the 2nd and 3rd MTP joints. IMPRESSION: Soft tissue swelling/wound along the medial aspect of the 1st metatarsal head. No definite underlying cortical destruction to suggest acute osteomyelitis. Additional degenerative changes, as above.  -MRI of Foot w/o Contrast ordered  -Low dose IVF with NS at 50 mL/hr -Patient not Septic; [place] by Podiatry on 08/21/2016 as well as Today. Dr. Hilaria Ota feels patient will need a surgical exostectomy at the First MP joint of Right Foot and placement of Abx Beads - Will do Surgery Tomorrow 09/08/16 -Will consult Wound Care;  -Dr. Daylene Katayama of [facility] contacted and consulted for Surgical Management -Has been on Doxycycline by PCP and Ciprofloxacin started 10/27 --> as an outpatient -Patient has Right Lateral Ankle Ulcer as well -IV Abx of Vancomycin with Pharmacy to dose  2. Hypertension -Continue with Amlodipine 5 mg; Hold Losartan because of Kidney Fxn  3. Gout -Will Hold Allopurinol because of Patient's Kidney Fxn and Unilateral Kidney  4. Anemia of Chronic Disease  with likely concomitant IDA.  -Patient's Hb/Hct was 9.5/29.4 -Review of Labs shows baseline around 10.5 -C/w Ferrous Sulfate  5. Acute Kidney Injury on CKD Stage 3 -Patient's BUN/Cr were 51/2.90; Baseline Cr was 2.5 -Low Dose IVF at 50 mL/hr -Hold Bumex and Losartan Currently  6. Mild Hyponatremia -Will Rehydrate with Low dose IVF with NS at 50 mL/hr -Repeat BMP in AM  7. Hypothyroidism -Check TSH -C/w Synthroid 150 mcg  8. Hyperglycemia -Daughter states Patient is Pre-Diabetic  -Patient's BS on Arrival was 150 -Obtain HbA1c -If BS are consistently high place on SSI with CBG monitoring  9. History of Renal Cell Carcinoma s/p Left Nephrectomy with subsequent Metastatic Cancer to the Lung s/p Radiation Tx.  -Per Patient's Daughter Patient is Cancer Free  DVT prophylaxis: Heparin sq Code Status: Full Family Communication: Discussed with Patient's Daughter the Plan of Care Disposition Plan: Admission onto the Med/Surg Consults called: WILL NEED TO CALL Dr. Daylene Katayama. Admission status: Inpatient  [facility], D.O. [facility] Pager [phone]  If 7PM-7AM, please contact night-coverage [email or web address] Password [email or web address]  09/07/2016, 7:05 PM

## 2016-09-07 NOTE — ED Notes (Signed)
Original lactic was normal no need for second per MD.

## 2016-09-07 NOTE — Progress Notes (Signed)
New Admission Note:   Arrival Method: Stretcher from ED Mental Orientation: Alert and oriented x4 Telemetry: Box 30 SR Assessment: Completed Skin: ecchymosis and wound on great right toe     Iv: right   AC Pain: DENIES  Tubes: Safety Measures: Safety Fall Prevention Plan has been given, discussed and signed Admission: Completed Unit Orientation: Patient has been orientated to the room, unit and staff.  Family: DAUGHTER AT BEDSIDE  Orders have been reviewed and implemented. Will continue to monitor the patient. Call light has been placed within reach and bed alarm has been activated.

## 2016-09-07 NOTE — ED Provider Notes (Signed)
[place] DEPT Provider Note   CSN: [id number] Arrival date & time: 09/07/16  1533     History   Chief Complaint Chief Complaint  Patient presents with  . Wound Infection    HPI [name] is a 82 y.o. male.  HPI  Patient presents with concern of a worsening wound on his right medial distal foot. Patient has multiple nonhealing wounds, including the ED most concerning one, as well as one on the outside of his foot. With the past few days patient has had noticed increasing erythema, drainage about his right medial foot. Patient started on Cipro Floxin 2 days ago. In spite of this, symptoms are increasing. No new fever, nausea, vomiting, behavioral changes. Today the patient went to his podiatrist, had evaluation, was sent here for concern of cellulitis, possible osteomyelitis   Past Medical History:  Diagnosis Date  . Anemia   . Arthritis    stenosis - back, neck  . Cancer [facility])    renal neoplasm  . Chronic kidney disease    renalCa- nephrectomy [facility]) 04/2012  . Coronary artery disease   . GERD (gastroesophageal reflux disease)   . Hard of hearing   . Hearing deficit    hearing aids- bilateral  . Hypertension    sees Dr. Wynonia Lawman, stress test recently for prep for surgery  . Hypothyroidism   . Lung cancer ([place])    right lower lobe nodule suspicious for metastatic clear cell renal cell neoplasm  . Prediabetes   . Vitamin D deficiency     Patient Active Problem List   Diagnosis Date Noted  . Non-pressure chronic ulcer of right ankle with fat layer exposed ([place]) 08/14/2016  . Atherosclerosis of aorta ([place]) 05/30/2016  . 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 Non compliance with medical treatment 08/20/2015  . Medicare annual wellness visit, subsequent 08/18/2015  . Gout 08/18/2015  . CAD (coronary artery disease), native coronary artery   . Obesity (BMI 30-39.9)   . Spinal stenosis   . Medication management  01/23/2014  . Hyperlipidemia 11/24/2013  . DJD   . Cancer of kidney ([place])   . GERD (gastroesophageal reflux disease)   . Hypertension   . CKD, stage IV (GFR 28 ml/min) (HCC)   . Hypothyroidism   . Vitamin D deficiency   . Abnormal blood sugar     Past Surgical History:  Procedure Laterality Date  . ANTERIOR CERVICAL DECOMP/DISCECTOMY FUSION N/A 01/18/2013   Procedure: ANTERIOR CERVICAL DECOMPRESSION/DISCECTOMY FUSION 2 LEVELS;  Surgeon: Faythe Ghee, MD;  Location: [place] NEURO ORS;  Service: Neurosurgery;  Laterality: N/A;  . APPENDECTOMY    . BACK SURGERY     lower back surgery - 1990's  . CHOLECYSTECTOMY    . CORONARY ANGIOPLASTY     stents , + 7-58yr. ago  . EYE SURGERY     right eye cataract surgery   . JOINT REPLACEMENT     bilateral knee replacements   . LAPAROSCOPIC NEPHRECTOMY  04/29/2012   Procedure: LAPAROSCOPIC NEPHRECTOMY;  Surgeon: LDutch Gray MD;  Location: [facility];  Service: Urology;  Laterality: Left;      . TONSILLECTOMY         Home Medications    Prior to Admission medications   Medication Sig Start Date End Date Taking? Authorizing Provider  allopurinol (ZYLOPRIM) 300 MG tablet TAKE 1 TABLET DAILY 11/17/15   WUnk Pinto MD  amLODipine (NORVASC) 5 MG tablet Take 1 tablet (5 mg  total) by mouth daily before breakfast. 08/19/16   Unk Pinto, MD  aspirin EC 81 MG tablet Take 81 mg by mouth daily.    Historical Provider, MD  bumetanide (BUMEX) 2 MG tablet TAKE ONE-HALF (1/2) TO ONE TABLET DAILY FOR FLUID 01/07/16   Unk Pinto, MD  Cholecalciferol (VITAMIN D3) 1000 UNITS CAPS Take 2,000 capsules by mouth daily.     Historical Provider, MD  diclofenac sodium (VOLTAREN) 1 % GEL Apply 4 g topically 4 (four) times daily. 08/22/16   Unk Pinto, MD  FERROUS SULFATE PO Take 1 tablet by mouth 2 (two) times daily.     Historical Provider, MD  levothyroxine (SYNTHROID, LEVOTHROID) 150 MCG tablet Take 1 pill daily but take 1.5 pills on Sunday, take 1  hour before food, only with water. 07/02/16   Unk Pinto, MD  losartan (COZAAR) 50 MG tablet Take 1 tablet (50 mg total) by mouth daily. 05/30/16   Vicie Mutters, PA-C  Magnesium 250 MG TABS Take 500 mg by mouth daily.     Historical Provider, MD  Multiple Vitamin (MULITIVITAMIN WITH MINERALS) TABS Take 1 tablet by mouth daily.    Historical Provider, MD  nitroGLYCERIN (NITROSTAT) 0.4 MG SL tablet Place 0.4 mg under the tongue every 5 (five) minutes as needed for chest pain.     Historical Provider, MD  pravastatin (PRAVACHOL) 40 MG tablet Take 1 tablet (40 mg total) by mouth at bedtime. 05/30/16   Vicie Mutters, PA-C  SANTYL ointment  08/02/16   Historical Provider, MD  traMADol (ULTRAM) 50 MG tablet TAKE 1 TABLET BY MOUTH UP TO 3 TIMES DAILY AS NEEDED FOR SEVERE PAIN 08/04/16   Unk Pinto, MD  vitamin C (ASCORBIC ACID) 500 MG tablet Take 500 mg by mouth 2 (two) times daily.    Historical Provider, MD    Family History Family History  Problem Relation Age of Onset  . Cancer Sister     breast  . Cancer Brother     colon    Social History Social History  Substance Use Topics  . Smoking status: Former Smoker    Types: Cigarettes    Quit date: 11/10/1970  . Smokeless tobacco: Never Used  . Alcohol use 6.6 oz/week    2 Cans of beer, 9 Shots of liquor per week     Comment: friday- beer & 3 drambuie     Allergies   Review of patient's allergies indicates no known allergies.   Review of Systems Review of Systems  Constitutional:       Per HPI, otherwise negative  HENT:       Per HPI, otherwise negative  Respiratory:       Per HPI, otherwise negative  Cardiovascular:       Per HPI, otherwise negative  Gastrointestinal: Negative for vomiting.  Endocrine:       Negative aside from HPI  Genitourinary:       Neg aside from HPI   Musculoskeletal:       Per HPI, otherwise negative  Skin: Positive for color change and wound.  Allergic/Immunologic: Negative for  immunocompromised state.  Neurological: Negative for syncope.     Physical Exam Updated Vital Signs BP 149/67 (BP Location: Right Arm)   Pulse 83   Temp 98 F (36.7 C) (Oral)   Resp 18   Wt 254 lb (115.2 kg)   SpO2 99%   BMI 38.62 kg/m   Physical Exam  Constitutional: He is oriented to person, place, and  time. He appears well-developed. He has a sickly appearance. No distress.  HENT:  Head: Normocephalic and atraumatic.  Eyes: Conjunctivae and EOM are normal.  Cardiovascular: Normal rate and regular rhythm.   Pulmonary/Chest: Effort normal. No stridor. No respiratory distress.  Abdominal: He exhibits no distension.  Musculoskeletal: He exhibits no edema.  Neurological: He is alert and oriented to person, place, and time.  Patient can move affected toes and foot appropriately.  Gross sensation intact in the foot, though there is no fine sensation.  Skin:     Psychiatric: He has a normal mood and affect.  Nursing note and vitals reviewed.    ED Treatments / Results  Labs (all labs ordered are listed, but only abnormal results are displayed) Labs Reviewed  COMPREHENSIVE METABOLIC PANEL - Abnormal; Notable for the following:       Result Value   Sodium 132 (*)    Glucose, Bld 150 (*)    BUN 51 (*)    Creatinine, Ser 2.90 (*)    Calcium 8.7 (*)    Albumin 2.7 (*)    GFR calc non Af Amer 18 (*)    GFR calc Af Amer 21 (*)    All other components within normal limits  CBC WITH DIFFERENTIAL/PLATELET - Abnormal; Notable for the following:    RBC 3.05 (*)    Hemoglobin 9.5 (*)    HCT 29.4 (*)    Monocytes Absolute 1.1 (*)    All other components within normal limits  CULTURE, BLOOD (SINGLE)  I-STAT CG4 LACTIC ACID, ED  Initial labs notable for persistent kidney disease, creatinine 2.9.    Radiology I reviewed the x-ray, agree with the interpretation.   Procedures Procedures (including critical care time)   Initial Impression / Assessment and Plan / ED  Course  I have reviewed the triage vital signs and the nursing notes.  Pertinent labs & imaging results that were available during my care of the patient were reviewed by me and considered in my medical decision making (see chart for details).  Clinical Course     Final Clinical Impressions(s) / ED Diagnoses  Elderly male multiple medical issues presents with worsening right foot wound. Patient has been on oral antibiotics and given the progression of symptoms in spite of this, there is concern for worsening cellulitis, possible osteomyelitis. Patient transitioned to IV vancomycin. Blood cultures sent. Patient admitted for further evaluation and management.    Carmin Muskrat, MD 09/07/16 [phone]

## 2016-09-07 NOTE — ED Notes (Signed)
Daughter advised the patient has been dealing with a sore on his ankle for the past 2-3 months when the sore developed on his right great toe. Pt has been going to the wound center for care and they thought he needed some IV antibiotics to help with the healing.

## 2016-09-08 ENCOUNTER — Encounter (HOSPITAL_COMMUNITY)
Admission: EM | Disposition: A | Discharge status: Skilled Nursing Facility | Payer: Self-pay | Source: Home / Self Care | Attending: Internal Medicine

## 2016-09-08 ENCOUNTER — Inpatient Hospital Stay (HOSPITAL_COMMUNITY): Payer: Medicare Other | Admitting: Anesthesiology

## 2016-09-08 ENCOUNTER — Encounter (HOSPITAL_COMMUNITY): Payer: Self-pay | Admitting: Anesthesiology

## 2016-09-08 ENCOUNTER — Telehealth: Payer: Self-pay | Admitting: *Deleted

## 2016-09-08 DIAGNOSIS — M2011 Hallux valgus (acquired), right foot: Secondary | ICD-10-CM

## 2016-09-08 DIAGNOSIS — L03031 Cellulitis of right toe: Secondary | ICD-10-CM

## 2016-09-08 DIAGNOSIS — Q6621 Congenital metatarsus primus varus: Secondary | ICD-10-CM

## 2016-09-08 HISTORY — PX: WOUND DEBRIDEMENT: SHX247

## 2016-09-08 HISTORY — PX: I&D EXTREMITY: SHX5045

## 2016-09-08 HISTORY — PX: TOE ARTHROPLASTY: SHX6504

## 2016-09-08 LAB — BASIC METABOLIC PANEL
BUN: 46 mg/dL — AB (ref 4–21)
Creatinine: 2.6 mg/dL — AB (ref 0.6–1.3)
Glucose: 127 mg/dL
Potassium: 4.3 mmol/L (ref 3.4–5.3)
Sodium: 136 mmol/L — AB (ref 137–147)

## 2016-09-08 LAB — COMPREHENSIVE METABOLIC PANEL
ALBUMIN: 2.5 g/dL — AB (ref 3.5–5.0)
ALT: 15 U/L — AB (ref 17–63)
AST: 20 U/L (ref 15–41)
Alkaline Phosphatase: 69 U/L (ref 38–126)
Anion gap: 8 (ref 5–15)
BILIRUBIN TOTAL: 0.5 mg/dL (ref 0.3–1.2)
BUN: 46 mg/dL — AB (ref 6–20)
CHLORIDE: 108 mmol/L (ref 101–111)
CO2: 20 mmol/L — ABNORMAL LOW (ref 22–32)
CREATININE: 2.64 mg/dL — AB (ref 0.61–1.24)
Calcium: 8.5 mg/dL — ABNORMAL LOW (ref 8.9–10.3)
GFR calc Af Amer: 24 mL/min — ABNORMAL LOW (ref >60)
GFR, EST NON AFRICAN AMERICAN: 21 mL/min — AB (ref >60)
GLUCOSE: 127 mg/dL — AB (ref 65–99)
POTASSIUM: 4.3 mmol/L (ref 3.5–5.1)
Sodium: 136 mmol/L (ref 135–145)
Total Protein: 6.5 g/dL (ref 6.5–8.1)

## 2016-09-08 LAB — CBC
HEMATOCRIT: 28.9 % — AB (ref 39.0–52.0)
Hemoglobin: 9.2 g/dL — ABNORMAL LOW (ref 13.0–17.0)
MCH: 30.6 pg (ref 26.0–34.0)
MCHC: 31.8 g/dL (ref 30.0–36.0)
MCV: 96 fL (ref 78.0–100.0)
PLATELETS: 256 10*3/uL (ref 150–400)
RBC: 3.01 MIL/uL — ABNORMAL LOW (ref 4.22–5.81)
RDW: 13.8 % (ref 11.5–15.5)
WBC: 9.3 10*3/uL (ref 4.0–10.5)

## 2016-09-08 LAB — HEMOGLOBIN A1C
HEMOGLOBIN A1C: 6.3 % — AB (ref 4.8–5.6)
MEAN PLASMA GLUCOSE: 134 mg/dL

## 2016-09-08 LAB — HEPATIC FUNCTION PANEL
ALK PHOS: 77 U/L (ref 25–125)
ALT: 17 U/L (ref 10–40)
AST: 22 U/L (ref 14–40)
BILIRUBIN, TOTAL: 0.5 mg/dL

## 2016-09-08 LAB — CBC AND DIFFERENTIAL
HEMATOCRIT: 29 % — AB (ref 41–53)
HEMOGLOBIN: 9.5 g/dL — AB (ref 13.5–17.5)
PLATELETS: 241 10*3/uL (ref 150–399)
WBC: 9.7 10*3/mL

## 2016-09-08 SURGERY — IRRIGATION AND DEBRIDEMENT EXTREMITY
Anesthesia: General | Site: Foot | Laterality: Right

## 2016-09-08 MED ORDER — VANCOMYCIN HCL 500 MG IV SOLR
INTRAVENOUS | Status: AC
  Filled 2016-09-08: qty 500

## 2016-09-08 MED ORDER — PROPOFOL 10 MG/ML IV BOLUS
INTRAVENOUS | Status: DC | PRN
  Administered 2016-09-08: 150 mg via INTRAVENOUS

## 2016-09-08 MED ORDER — METOCLOPRAMIDE HCL 5 MG/ML IJ SOLN: 10.0000 mg | Freq: Once | INTRAMUSCULAR | Status: DC | PRN

## 2016-09-08 MED ORDER — SODIUM CHLORIDE 0.9 % IV SOLN
INTRAVENOUS | Status: DC
  Administered 2016-09-08 (×3): via INTRAVENOUS

## 2016-09-08 MED ORDER — EPHEDRINE 5 MG/ML INJ
INTRAVENOUS | Status: AC
Start: 2016-09-08 — End: 2016-09-08
  Filled 2016-09-08: qty 10

## 2016-09-08 MED ORDER — ONDANSETRON HCL 4 MG/2ML IJ SOLN
INTRAMUSCULAR | Status: DC | PRN
Start: 2016-09-08 — End: 2016-09-08
  Administered 2016-09-08: 4 mg via INTRAVENOUS

## 2016-09-08 MED ORDER — VANCOMYCIN HCL 500 MG IV SOLR
INTRAVENOUS | Status: DC | PRN
  Administered 2016-09-08: 500 mg

## 2016-09-08 MED ORDER — 0.9 % SODIUM CHLORIDE (POUR BTL) OPTIME
TOPICAL | Status: DC | PRN
  Administered 2016-09-08 (×2): 1000 mL

## 2016-09-08 MED ORDER — PROPOFOL 10 MG/ML IV BOLUS
INTRAVENOUS | Status: AC
  Filled 2016-09-08: qty 20

## 2016-09-08 MED ORDER — FENTANYL CITRATE (PF) 100 MCG/2ML IJ SOLN
INTRAMUSCULAR | Status: DC | PRN
  Administered 2016-09-08 (×2): 50 ug via INTRAVENOUS

## 2016-09-08 MED ORDER — LIDOCAINE HCL (CARDIAC) 20 MG/ML IV SOLN
INTRAVENOUS | Status: DC | PRN
  Administered 2016-09-08: 100 mg via INTRAVENOUS

## 2016-09-08 MED ORDER — EPHEDRINE SULFATE 50 MG/ML IJ SOLN
INTRAMUSCULAR | Status: DC | PRN
  Administered 2016-09-08: 10 mg via INTRAVENOUS

## 2016-09-08 MED ORDER — FENTANYL CITRATE (PF) 100 MCG/2ML IJ SOLN
INTRAMUSCULAR | Status: AC
  Filled 2016-09-08: qty 2

## 2016-09-08 MED ORDER — ONDANSETRON HCL 4 MG/2ML IJ SOLN
INTRAMUSCULAR | Status: AC
  Filled 2016-09-08: qty 2

## 2016-09-08 MED ORDER — FENTANYL CITRATE (PF) 100 MCG/2ML IJ SOLN
INTRAMUSCULAR | Status: AC
  Administered 2016-09-08: 50 ug via INTRAVENOUS
  Filled 2016-09-08: qty 2

## 2016-09-08 MED ORDER — TOBRAMYCIN SULFATE 1.2 G IJ SOLR
INTRAMUSCULAR | Status: AC
  Filled 2016-09-08: qty 1.2

## 2016-09-08 MED ORDER — PHENYLEPHRINE 40 MCG/ML (10ML) SYRINGE FOR IV PUSH (FOR BLOOD PRESSURE SUPPORT)
PREFILLED_SYRINGE | INTRAVENOUS | Status: AC
  Filled 2016-09-08: qty 10

## 2016-09-08 MED ORDER — PHENYLEPHRINE HCL 10 MG/ML IJ SOLN
INTRAMUSCULAR | Status: DC | PRN
  Administered 2016-09-08 (×2): 80 ug via INTRAVENOUS

## 2016-09-08 MED ORDER — TOBRAMYCIN SULFATE 1.2 G IJ SOLR
INTRAMUSCULAR | Status: DC | PRN
  Administered 2016-09-08: 1.2 g

## 2016-09-08 MED ORDER — SODIUM CHLORIDE 0.9 % IR SOLN
Status: DC | PRN
  Administered 2016-09-08: 3000 mL

## 2016-09-08 MED ORDER — LIDOCAINE 2% (20 MG/ML) 5 ML SYRINGE
INTRAMUSCULAR | Status: AC
  Filled 2016-09-08: qty 5

## 2016-09-08 MED ORDER — FENTANYL CITRATE (PF) 100 MCG/2ML IJ SOLN
25.0000 ug | INTRAMUSCULAR | Status: DC | PRN
  Administered 2016-09-08 (×2): 50 ug via INTRAVENOUS

## 2016-09-08 SURGICAL SUPPLY — 59 items
BANDAGE ACE 4X5 VEL STRL LF (GAUZE/BANDAGES/DRESSINGS) ×1 IMPLANT
BLADE 10 SAFETY STRL DISP (BLADE) ×2 IMPLANT
BLADE LONG MED 31X9 (MISCELLANEOUS) ×1 IMPLANT
BLADE SURG 10 STRL SS (BLADE) ×2 IMPLANT
BLADE SURG 15 STRL LF DISP TIS (BLADE) IMPLANT
BLADE SURG 15 STRL SS (BLADE) ×4
BNDG COHESIVE 4X5 TAN STRL (GAUZE/BANDAGES/DRESSINGS) ×1 IMPLANT
BNDG COHESIVE 6X5 TAN STRL LF (GAUZE/BANDAGES/DRESSINGS) ×2 IMPLANT
BNDG GAUZE ELAST 4 BULKY (GAUZE/BANDAGES/DRESSINGS) ×1 IMPLANT
BNDG GAUZE STRTCH 6 (GAUZE/BANDAGES/DRESSINGS) ×6 IMPLANT
COTTON STERILE ROLL (GAUZE/BANDAGES/DRESSINGS) ×2 IMPLANT
COVER SURGICAL LIGHT HANDLE (MISCELLANEOUS) ×2 IMPLANT
CUFF TOURNIQUET SINGLE 18IN (TOURNIQUET CUFF) ×3 IMPLANT
CUFF TOURNIQUET SINGLE 24IN (TOURNIQUET CUFF) IMPLANT
CUFF TOURNIQUET SINGLE 34IN LL (TOURNIQUET CUFF) IMPLANT
CUFF TOURNIQUET SINGLE 44IN (TOURNIQUET CUFF) IMPLANT
DRAPE ORTHO SPLIT 77X108 STRL (DRAPES)
DRAPE SURG ORHT 6 SPLT 77X108 (DRAPES) ×2 IMPLANT
DRAPE U-SHAPE 47X51 STRL (DRAPES) ×2 IMPLANT
DRSG EMULSION OIL 3X3 NADH (GAUZE/BANDAGES/DRESSINGS) ×1 IMPLANT
DRSG PAD ABDOMINAL 8X10 ST (GAUZE/BANDAGES/DRESSINGS) ×1 IMPLANT
DURAPREP 26ML APPLICATOR (WOUND CARE) ×2 IMPLANT
ELECT CAUTERY BLADE 6.4 (BLADE) IMPLANT
ELECT REM PT RETURN 9FT ADLT (ELECTROSURGICAL)
ELECTRODE REM PT RTRN 9FT ADLT (ELECTROSURGICAL) IMPLANT
GAUZE PACKING FOLDED 1/2 STR (GAUZE/BANDAGES/DRESSINGS) ×1 IMPLANT
GAUZE SPONGE 4X4 12PLY STRL (GAUZE/BANDAGES/DRESSINGS) ×2 IMPLANT
GAUZE XEROFORM 1X8 LF (GAUZE/BANDAGES/DRESSINGS) ×2 IMPLANT
GLOVE BIOGEL PI IND STRL 8 (GLOVE) ×2 IMPLANT
GLOVE BIOGEL PI INDICATOR 8 (GLOVE) ×2
GLOVE ORTHO TXT STRL SZ7.5 (GLOVE) ×2 IMPLANT
GOWN STRL REUS W/ TWL LRG LVL3 (GOWN DISPOSABLE) ×1 IMPLANT
GOWN STRL REUS W/ TWL XL LVL3 (GOWN DISPOSABLE) ×1 IMPLANT
GOWN STRL REUS W/TWL LRG LVL3 (GOWN DISPOSABLE) ×2
GOWN STRL REUS W/TWL XL LVL3 (GOWN DISPOSABLE) ×2
HANDPIECE INTERPULSE COAX TIP (DISPOSABLE) ×2
IV NS IRRIG 3000ML ARTHROMATIC (IV SOLUTION) ×1 IMPLANT
KIT BASIN OR (CUSTOM PROCEDURE TRAY) ×2 IMPLANT
KIT ROOM TURNOVER OR (KITS) ×2 IMPLANT
KIT STIMULAN RAPID CURE 5CC (Orthopedic Implant) ×2 IMPLANT
MANIFOLD NEPTUNE II (INSTRUMENTS) ×2 IMPLANT
NS IRRIG 1000ML POUR BTL (IV SOLUTION) ×2 IMPLANT
PACK ORTHO EXTREMITY (CUSTOM PROCEDURE TRAY) ×2 IMPLANT
PAD ARMBOARD 7.5X6 YLW CONV (MISCELLANEOUS) ×4 IMPLANT
PADDING CAST COTTON 6X4 STRL (CAST SUPPLIES) ×1 IMPLANT
SET HNDPC FAN SPRY TIP SCT (DISPOSABLE) IMPLANT
SPONGE GAUZE 4X4 12PLY STER LF (GAUZE/BANDAGES/DRESSINGS) ×1 IMPLANT
SPONGE LAP 18X18 X RAY DECT (DISPOSABLE) ×2 IMPLANT
STOCKINETTE IMPERVIOUS 9X36 MD (GAUZE/BANDAGES/DRESSINGS) ×2 IMPLANT
SUT ETHILON 2 0 FS 18 (SUTURE) ×3 IMPLANT
SUT ETHILON 3 0 PS 1 (SUTURE) ×2 IMPLANT
SUT PROLENE 4 0 PS 2 18 (SUTURE) ×2 IMPLANT
TOWEL OR 17X24 6PK STRL BLUE (TOWEL DISPOSABLE) ×2 IMPLANT
TOWEL OR 17X26 10 PK STRL BLUE (TOWEL DISPOSABLE) ×2 IMPLANT
TUBE ANAEROBIC SPECIMEN COL (MISCELLANEOUS) IMPLANT
TUBE CONNECTING 12X1/4 (SUCTIONS) ×2 IMPLANT
UNDERPAD 30X30 (UNDERPADS AND DIAPERS) ×2 IMPLANT
WATER STERILE IRR 1000ML POUR (IV SOLUTION) ×2 IMPLANT
YANKAUER SUCT BULB TIP NO VENT (SUCTIONS) ×2 IMPLANT

## 2016-09-08 NOTE — Brief Op Note (Signed)
09/07/2016 - 09/08/2016  9:31 PM  PATIENT:  [name]  82 y.o. male  PRE-OPERATIVE DIAGNOSIS:  Osteomyelitis Right Toe  POST-OPERATIVE DIAGNOSIS:  Questionable Osteomyelitis Right foot  PROCEDURE:  Procedure(s): IRRIGATION AND DEBRIDEMENT RIGHT FOOT WITH ANTIBIOTIC BEADS (Right) Arthroplasty of First MPJ RIGHT FOOT (Right) DEBRIDEMENT RIGHT ANKLE WOUND (Right)  SURGEON:  Surgeon(s) and Role:    * Edrick Kins, DPM - Primary  PHYSICIAN ASSISTANT:   ASSISTANTS: none   ANESTHESIA:   local and general  EBL:  Total I/O In: 600 [I.V.:600] Out: 5 [Blood:5]  BLOOD ADMINISTERED:none  DRAINS: none and None   LOCAL MEDICATIONS USED:  MARCAINE     SPECIMEN:  Source of Specimen:  Bone right foot. Deep wound culture right foot.   DISPOSITION OF SPECIMEN:  Bone RT foot.   COUNTS:  YES  TOURNIQUET:   Total Tourniquet Time Documented: Calf (Right) - 54 minutes Total: Calf (Right) - 54 minutes   DICTATION: .Viviann Spare Dictation  PLAN OF CARE: Inpatient. Return to room.  PATIENT DISPOSITION:  PACU - hemodynamically stable.   Delay start of Pharmacological VTE agent (>24hrs) due to surgical blood loss or risk of bleeding: N/A

## 2016-09-08 NOTE — Progress Notes (Signed)
[name] YYT:[id number] DOB: 1929/09/26 DOA: 09/07/2016 PCP: Alesia Richards, MD  Brief narrative:  82 y/o ? hld htn Gout CAD s/p stent ~ 2009 rcc s/p  Nephrectomy L side with Lung mets s/p XRT Anemia of chr disease CKD 4 GERD S/p ACDF 2014  Follows with Dr. Con Memos for CHr foot wound-was being Rx for this since 07/24/16 with Abx but failed conservative therpies Admitted and consideration per Podiatry for R MPJ 1st arthroplasty  Past medical history-As per Problem list Chart reviewed as below-   Consultants:  Podiatry  Procedures:    Antibiotics:  Vancomycin 10/29   Subjective   Alert pleasant Very HOH No n/v/cp Currently NPO   Objective    Interim History:   Telemetry: sinus   Objective: Vitals:   09/07/16 2000 09/07/16 2152 09/08/16 0608 09/08/16 0833  BP:  (!) 173/68 (!) 159/57 (!) 165/53  Pulse:  92 94 94  Resp:  '20 17 18  '$ Temp: 98.7 F (37.1 C) 98.3 F (36.8 C) 97.9 F (36.6 C) 98.9 F (37.2 C)  TempSrc: Oral Oral Oral Oral  SpO2:  97% 97% 98%  Weight:  115.2 kg (253 lb 15.5 oz)    Height:        Intake/Output Summary (Last 24 hours) at 09/08/16 0920 Last data filed at 09/08/16 0611  Gross per 24 hour  Intake           536.67 ml  Output             1025 ml  Net          -488.33 ml    Exam:  General: eomi ncat Cardiovascular:  s1 s2 no m/r/g Respiratory: clear without adde dsound Abdomen: soft nt nd no rebound no gaurd Skin no l edema-woudn noted on medial aspect R gr8 toe-slough and discharge present Neuro intact moving all 4 limbs without deficit  Data Reviewed: Basic Metabolic Panel:  Recent Labs Lab 09/07/16 1608 09/08/16 0615  NA 132* 136  K 4.4 4.3  CL 101 108  CO2 22 20*  GLUCOSE 150* 127*  BUN 51* 46*  CREATININE 2.90* 2.64*  CALCIUM 8.7* 8.5*   Liver Function Tests:  Recent Labs Lab 09/07/16 1608 09/08/16 0615  AST 22 20  ALT 17 15*  ALKPHOS 77 69  BILITOT 0.5 0.5  PROT 6.9 6.5    ALBUMIN 2.7* 2.5*   No results for input(s): LIPASE, AMYLASE in the last 168 hours. No results for input(s): AMMONIA in the last 168 hours. CBC:  Recent Labs Lab 09/07/16 1608 09/08/16 0615  WBC 9.7 9.3  NEUTROABS 7.3  --   HGB 9.5* 9.2*  HCT 29.4* 28.9*  MCV 96.4 96.0  PLT 241 256   Cardiac Enzymes: No results for input(s): CKTOTAL, CKMB, CKMBINDEX, TROPONINI in the last 168 hours. BNP: Invalid input(s): POCBNP CBG: No results for input(s): GLUCAP in the last 168 hours.  No results found for this or any previous visit (from the past 240 hour(s)).   Studies:              All Imaging reviewed and is as per above notation   Scheduled Meds: . amLODipine  5 mg Oral Daily  . aspirin EC  81 mg Oral Daily  . cholecalciferol  2,000 Units Oral Daily  . ferrous sulfate  325 mg Oral BID WC  . heparin  5,000 Units Subcutaneous Q8H  . levothyroxine  150 mcg Oral Once per day on Mon Tue  Wed Thu Fri Sat  . [START ON 09/14/2016] levothyroxine  225 mcg Oral Once per day on Sun  . multivitamin with minerals  1 tablet Oral Daily  . pravastatin  40 mg Oral QHS  . sodium chloride flush  3 mL Intravenous Q12H  . [START ON 09/09/2016] vancomycin  1,500 mg Intravenous Q48H  . vitamin C  500 mg Oral BID   Continuous Infusions: . 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  For surgery later toda 10/30.  Therapy/acitvity as perPodiatry post-op Gout-allopurinol on hold Htn-Cont amlodipine 5, Losartan 50 held 2/2 to AKI CKD IV-Baseline creat ~ 2.2/GFR 18.  Monitor labs am-cont saline 50cc/hr AoCD-blood counts stable 9-10 range. Cont oral Ferrous.  Might nee diron studies and IV iron a sOP Hyponatremia on admission resolved with IVF Hypothyroid-cont synthroid 100 mcg qd, TSH 23 mo ago wnl 0.78 Pre-DM-A1C this admit 6.3.  Might benefit from OP further counselling and initiation DM meds. 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 Alen Blew as OP   No family + Full code 3-4 days Therapy as per podiatry  [name], MD  [facility] Pager [phone] 09/08/2016, 9:20 AM    LOS: 1 day

## 2016-09-08 NOTE — Anesthesia Preprocedure Evaluation (Incomplete Revision)
Anesthesia Evaluation  Patient identified by MRN, date of birth, ID band Patient awake    Reviewed: Allergy & Precautions, NPO status , Patient's Chart, lab work & pertinent test results  Airway        Dental   Pulmonary former smoker,           Cardiovascular hypertension, + CAD and + Peripheral Vascular Disease       Neuro/Psych    GI/Hepatic Neg liver ROS, GERD  ,  Endo/Other  Hypothyroidism   Renal/GU Renal disease     Musculoskeletal  (+) Arthritis ,   Abdominal   Peds  Hematology  (+) anemia ,   Anesthesia Other Findings   Reproductive/Obstetrics                             Anesthesia Physical Anesthesia Plan  ASA: III  Anesthesia Plan: General   Post-op Pain Management:    Induction: Intravenous  Airway Management Planned: LMA  Additional Equipment:   Intra-op Plan:   Post-operative Plan: Extubation in OR  Informed Consent: I have reviewed the patients History and Physical, chart, labs and discussed the procedure including the risks, benefits and alternatives for the proposed anesthesia with the patient or authorized representative who has indicated his/her understanding and acceptance.     Plan Discussed with: CRNA and Anesthesiologist  Anesthesia Plan Comments:         Anesthesia Quick Evaluation

## 2016-09-08 NOTE — Progress Notes (Signed)
Pt returned to unit from the PACU. Post I & D to R great toe. Denies pain. R toe cool to touch but blanchable. Will continue to monitor. Dorthey Sawyer, RN

## 2016-09-08 NOTE — Progress Notes (Signed)
Family updated on delay. Patient resting

## 2016-09-08 NOTE — Transfer of Care (Signed)
Immediate Anesthesia Transfer of Care Note  Patient: ELDRICK PENICK  Procedure(s) Performed: Procedure(s): IRRIGATION AND DEBRIDEMENT RIGHT FOOT WITH ANTIBIOTIC BEADS (Right) Arthroplasty of First MPJ RIGHT FOOT (Right) DEBRIDEMENT RIGHT ANKLE WOUND (Right)  Patient Location: PACU  Anesthesia Type:General  Level of Consciousness: awake, oriented, patient cooperative and responds to stimulation  Airway & Oxygen Therapy: Patient Spontanous Breathing and Patient connected to nasal cannula oxygen  Post-op Assessment: Report given to RN, Post -op Vital signs reviewed and stable and Patient moving all extremities X 4  Post vital signs: Reviewed and stable  Last Vitals:  Vitals:   09/08/16 0833 09/08/16 1456  BP: (!) 165/53 (!) 160/62  Pulse: 94 80  Resp: 18 18  Temp: 37.2 C 37.3 C    Last Pain:  Vitals:   09/08/16 1456  TempSrc: Oral  PainSc: 0-No pain         Complications: No apparent anesthesia complications

## 2016-09-08 NOTE — Progress Notes (Signed)
Sarah, CRNA at bedside. Report given to Judson Roch, CRNA

## 2016-09-08 NOTE — Consult Note (Addendum)
WOC consult requested for foot wound prior to ortho service involvement. They are now following for assessment and plan of care and pt is scheduled for surgery today, according to the EMR.  Please refer to their team for further questions. Please re-consult if further assistance is needed.  Thank-you,  Julien Girt MSN, [place], [place], [place], [place]

## 2016-09-08 NOTE — Anesthesia Procedure Notes (Signed)
Procedure Name: LMA Insertion Date/Time: 09/08/2016 7:59 PM Performed by: Trixie Deis A Pre-anesthesia Checklist: Patient identified, Emergency Drugs available, Suction available and Patient being monitored Patient Re-evaluated:Patient Re-evaluated prior to inductionOxygen Delivery Method: Circle System Utilized Preoxygenation: Pre-oxygenation with 100% oxygen Intubation Type: IV induction Ventilation: Mask ventilation without difficulty LMA: LMA inserted LMA Size: 5.0 Number of attempts: 2 (LMA #4 too small ) Airway Equipment and Method: Bite block Placement Confirmation: positive ETCO2 Tube secured with: Tape Dental Injury: Teeth and Oropharynx as per pre-operative assessment

## 2016-09-08 NOTE — Anesthesia Preprocedure Evaluation (Addendum)
Anesthesia Evaluation  Patient identified by MRN, date of birth, ID band Patient awake    Reviewed: Allergy & Precautions, NPO status , Patient's Chart, lab work & pertinent test results  Airway Mallampati: I  TM Distance: >3 FB Neck ROM: Full    Dental  (+) Edentulous Lower, Edentulous Upper   Pulmonary former smoker,  Lung Ca- ? Metastatic from renal cell Ca   Pulmonary exam normal breath sounds clear to auscultation       Cardiovascular hypertension, + CAD, + Cardiac Stents and + Peripheral Vascular Disease   Rhythm:Regular Rate:Normal  PTCA with stent 2009   Neuro/Psych Hearing deficits bilateral- uses hearing aids negative psych ROS   GI/Hepatic GERD  Medicated and Controlled,  Endo/Other  Hypothyroidism Obesity Gout Hyperlipidemia  Renal/GU CRFRenal diseaseHx/o renal cell Ca- stage 4  negative genitourinary   Musculoskeletal  (+) Arthritis ,   Abdominal (+) + obese,   Peds  Hematology  (+) anemia ,   Anesthesia Other Findings   Reproductive/Obstetrics                            Lab Results  Component Value Date   WBC 9.3 09/08/2016   HGB 9.2 (L) 09/08/2016   HCT 28.9 (L) 09/08/2016   MCV 96.0 09/08/2016   PLT 256 09/08/2016     Chemistry      Component Value Date/Time   NA 136 09/08/2016 0615   NA 137 01/15/2016 0841   K 4.3 09/08/2016 0615   K 4.7 01/15/2016 0841   CL 108 09/08/2016 0615   CO2 20 (L) 09/08/2016 0615   CO2 20 (L) 01/15/2016 0841   BUN 46 (H) 09/08/2016 0615   BUN 49.8 (H) 01/15/2016 0841   CREATININE 2.64 (H) 09/08/2016 0615   CREATININE 2.68 (H) 08/04/2016 1218   CREATININE 2.5 (H) 01/15/2016 0841      Component Value Date/Time   CALCIUM 8.5 (L) 09/08/2016 0615   CALCIUM 8.8 01/15/2016 0841   ALKPHOS 69 09/08/2016 0615   ALKPHOS 83 01/15/2016 0841   AST 20 09/08/2016 0615   AST 16 01/15/2016 0841   ALT 15 (L) 09/08/2016 0615   ALT 12  01/15/2016 0841   BILITOT 0.5 09/08/2016 0615   BILITOT 0.31 01/15/2016 0841      EKG: normal EKG, normal sinus rhythm.  Anesthesia Physical Anesthesia Plan  ASA: III  Anesthesia Plan: General   Post-op Pain Management:    Induction: Intravenous  Airway Management Planned:   Additional Equipment:   Intra-op Plan:   Post-operative Plan: Extubation in OR  Informed Consent: I have reviewed the patients History and Physical, chart, labs and discussed the procedure including the risks, benefits and alternatives for the proposed anesthesia with the patient or authorized representative who has indicated his/her understanding and acceptance.   Dental advisory given  Plan Discussed with:   Anesthesia Plan Comments:         Anesthesia Quick Evaluation

## 2016-09-08 NOTE — Consult Note (Signed)
[name] AOZ:[id number] DOB: Jan 16, 1929 DOA: 09/07/2016  PCP: Alesia Richards, MD   Reason for Consult : Osteomyelitis / Cellulitis RT foot.   Subjective: 82 year old male presents today as inpatient for evaluation of cellulitis and exposed bone to the right foot. Patient has been seen in the past by the [facility] under the care of Dr. Con Memos. Patient was referred to our office last week for surgical consideration due to chronic bone exposure and nonprogressive nature of a diabetic ulcer to the RT medial forefoot.  On last visit in the office which was 09/05/2016, recommended to the patient to be admitted to the [facility] for surgical intervention.   Objective/Physical Exam General: The patient is alert and oriented x3 in no acute distress.  Dermatology: Wound overlying the first MPJ of the right foot measuring approximately 3 cm in length 2.5 cm width 0.3 cm in depth. There is no eschar. There is extensive amount of slough fibrin and necrotic tissue. There is exposed bone. There is a moderate amount of serous segments drainage noted. Mild malodor. Periwound integrity is significantly erythematous with edema encompassing the entire foot and leg.   Vascular: Cellulitis with edema noted to the right lower extremity extending proximally to the midshaft of the leg. Redness and warmth noted increased compared to the contralateral   Neurological: Epicritic and protective threshold diminished bilaterally.   CBC    Component Value Date/Time   WBC 9.3 09/08/2016 0615   RBC 3.01 (L) 09/08/2016 0615   HGB 9.2 (L) 09/08/2016 0615   HGB 10.4 (L) 01/15/2016 0841   HCT 28.9 (L) 09/08/2016 0615   HCT 32.5 (L) 01/15/2016 0841   PLT 256 09/08/2016 0615   PLT 191 01/15/2016 0841   MCV 96.0 09/08/2016 0615   MCV 98.7 (H) 01/15/2016 0841   MCH 30.6 09/08/2016 0615   MCHC 31.8 09/08/2016 0615   RDW 13.8 09/08/2016 0615   RDW 14.8 (H) 01/15/2016 0841   LYMPHSABS 1.1  09/07/2016 1608   LYMPHSABS 1.8 01/15/2016 0841   MONOABS 1.1 (H) 09/07/2016 1608   MONOABS 0.8 01/15/2016 0841   EOSABS 0.2 09/07/2016 1608   EOSABS 0.4 01/15/2016 0841   BASOSABS 0.1 09/07/2016 1608   BASOSABS 0.0 01/15/2016 0841     Assessment: #1 cellulitis right lower extremity #2 exposed bone-osteomyelitis right foot #3 diabetic wound right foot   Plan of Care:  #1 Patient was evaluated this a.m. at bedside. Discussed in detail all possible complications and details regarding the surgical intervention which is scheduled today after 5 PM. All patient questions were answered. #2 surgery will include arthroplasty first MPJ right foot. Incision and drainage right foot. Placement of antibiotic beads right foot. #3 IV antibiotics as per attending physician. #4 preoperative orders placed today. Surgical consent will need to be obtained today. #5 podiatry will continue to follow while inpatient and post discharge.   Dr. Edrick Kins, [place]

## 2016-09-08 NOTE — Telephone Encounter (Signed)
"  I'm calling regarding my father.  We spoke to Dr. Amalia Hailey a couple of weeks ago about shaving a bone down on his large toe.  Wound care doctor said it is slightly infected and that we should get this done immediately.  So I am calling to schedule that appointment.  Call me back."

## 2016-09-08 NOTE — Progress Notes (Signed)
PT Cancellation Note  Patient Details Name: [name] MRN: [id number] DOB: [birth year]   Cancelled Treatment:    Reason Eval/Treat Not Completed: Patient at procedure or test/unavailable.  Pt in surgery this afternoon.  Will see 10/31 as able. 09/08/2016  Donnella Sham, [place] [phone]  (pager)   Floyd Wade, Tessie Fass 09/08/2016, 5:11 PM

## 2016-09-08 NOTE — Anesthesia Preprocedure Evaluation (Signed)
Anesthesia Evaluation  Patient identified by MRN, date of birth, ID band Patient awake    Reviewed: Allergy & Precautions, NPO status , Patient's Chart, lab work & pertinent test results  Airway        Dental   Pulmonary former smoker,           Cardiovascular hypertension, + CAD and + Peripheral Vascular Disease       Neuro/Psych    GI/Hepatic Neg liver ROS, GERD  ,  Endo/Other  Hypothyroidism   Renal/GU Renal disease     Musculoskeletal  (+) Arthritis ,   Abdominal   Peds  Hematology  (+) anemia ,   Anesthesia Other Findings   Reproductive/Obstetrics                             Anesthesia Physical Anesthesia Plan  ASA: III  Anesthesia Plan: General   Post-op Pain Management:    Induction: Intravenous  Airway Management Planned: LMA  Additional Equipment:   Intra-op Plan:   Post-operative Plan: Extubation in OR  Informed Consent: I have reviewed the patients History and Physical, chart, labs and discussed the procedure including the risks, benefits and alternatives for the proposed anesthesia with the patient or authorized representative who has indicated his/her understanding and acceptance.     Plan Discussed with: CRNA and Anesthesiologist  Anesthesia Plan Comments:         Anesthesia Quick Evaluation

## 2016-09-09 ENCOUNTER — Encounter (HOSPITAL_COMMUNITY): Payer: Self-pay | Admitting: Podiatry

## 2016-09-09 ENCOUNTER — Inpatient Hospital Stay (HOSPITAL_COMMUNITY): Payer: Medicare Other

## 2016-09-09 LAB — BASIC METABOLIC PANEL
ANION GAP: 7 (ref 5–15)
BUN: 38 mg/dL — AB (ref 4–21)
BUN: 38 mg/dL — ABNORMAL HIGH (ref 6–20)
CO2: 21 mmol/L — AB (ref 22–32)
Calcium: 8.6 mg/dL — ABNORMAL LOW (ref 8.9–10.3)
Chloride: 110 mmol/L (ref 101–111)
Creatinine, Ser: 2.14 mg/dL — ABNORMAL HIGH (ref 0.61–1.24)
Creatinine: 2.1 mg/dL — AB (ref 0.6–1.3)
GFR, EST AFRICAN AMERICAN: 31 mL/min — AB (ref >60)
GFR, EST NON AFRICAN AMERICAN: 26 mL/min — AB (ref >60)
GLUCOSE: 114 mg/dL
GLUCOSE: 114 mg/dL — AB (ref 65–99)
POTASSIUM: 4.4 mmol/L (ref 3.4–5.3)
POTASSIUM: 4.4 mmol/L (ref 3.5–5.1)
SODIUM: 138 mmol/L (ref 137–147)
Sodium: 138 mmol/L (ref 135–145)

## 2016-09-09 LAB — CBC WITH DIFFERENTIAL/PLATELET
BASOS PCT: 1 %
Basophils Absolute: 0.1 10*3/uL (ref 0.0–0.1)
EOS ABS: 0.4 10*3/uL (ref 0.0–0.7)
EOS PCT: 5 %
HEMATOCRIT: 28.1 % — AB (ref 39.0–52.0)
Hemoglobin: 9 g/dL — ABNORMAL LOW (ref 13.0–17.0)
Lymphocytes Relative: 18 %
Lymphs Abs: 1.5 10*3/uL (ref 0.7–4.0)
MCH: 30.8 pg (ref 26.0–34.0)
MCHC: 32 g/dL (ref 30.0–36.0)
MCV: 96.2 fL (ref 78.0–100.0)
MONO ABS: 0.8 10*3/uL (ref 0.1–1.0)
MONOS PCT: 10 %
Neutro Abs: 5.5 10*3/uL (ref 1.7–7.7)
Neutrophils Relative %: 66 %
PLATELETS: 246 10*3/uL (ref 150–400)
RBC: 2.92 MIL/uL — ABNORMAL LOW (ref 4.22–5.81)
RDW: 14 % (ref 11.5–15.5)
WBC: 8.3 10*3/uL (ref 4.0–10.5)

## 2016-09-09 LAB — CBC AND DIFFERENTIAL
HCT: 28 % — AB (ref 41–53)
HEMOGLOBIN: 9 g/dL — AB (ref 13.5–17.5)
Platelets: 246 10*3/uL (ref 150–399)
WBC: 8.3 10^3/mL

## 2016-09-09 NOTE — Anesthesia Postprocedure Evaluation (Signed)
Anesthesia Post Note  Patient: [name]  Procedure(s) Performed: Procedure(s) (LRB): IRRIGATION AND DEBRIDEMENT RIGHT FOOT WITH ANTIBIOTIC BEADS (Right) Arthroplasty of First MPJ RIGHT FOOT (Right) DEBRIDEMENT RIGHT ANKLE WOUND (Right)  Patient location during evaluation: PACU Anesthesia Type: General Level of consciousness: awake and alert Pain management: pain level controlled Vital Signs Assessment: post-procedure vital signs reviewed and stable Respiratory status: spontaneous breathing, nonlabored ventilation, respiratory function stable and patient connected to nasal cannula oxygen Cardiovascular status: blood pressure returned to baseline and stable Postop Assessment: no signs of nausea or vomiting Anesthetic complications: no    Last Vitals:  Vitals:   09/08/16 2215 09/08/16 2243  BP: (!) 147/62 (!) 141/48  Pulse: 76 79  Resp: 11 18  Temp: 36.1 C 36.4 C    Last Pain:  Vitals:   09/08/16 2243  TempSrc: Oral  PainSc:                  Yanni Ruberg S

## 2016-09-09 NOTE — Progress Notes (Signed)
TRAI ELLS JOI:[id number] DOB: 14-Sep-1929 DOA: 09/07/2016 PCP: Alesia Richards, MD  Brief narrative:  82 y/o ? hld htn Gout CAD s/p stent ~ 2009 rcc s/p  Nephrectomy L side with Lung mets s/p XRT Anemia of chr disease CKD 4 GERD S/p ACDF 2014  Follows with Dr. Con Memos for Chr foot wound-was being Rx for this since 07/24/16 with Abx but failed conservative therpies Admitted and consideration per Podiatry for R MPJ 1st arthroplasty  Past medical history-As per Problem list Chart reviewed as below-   Consultants:  Podiatry  Procedures:    Antibiotics:  Vancomycin 10/29   Subjective   Mild pains rating 3-7/10 No fever no chills Told diet Has been oob some Has boot on foot now   Objective    Interim History:   Telemetry: sinus   Objective: Vitals:   09/08/16 2243 09/09/16 0542 09/09/16 0942 09/09/16 0949  BP: (!) 141/48 (!) 160/62 (!) 159/68 (!) 159/68  Pulse: 79 87 97   Resp: _0 Temp: 97.5 F (36.4 C) 97.9 F (36.6 C) 98.3 F (36.8 C)   TempSrc: Oral Oral Oral   SpO2: 95% 96% 97%   Weight: 110.2 kg (242 lb 15.2 oz)     Height:        Intake/Output Summary (Last 24 hours) at 09/09/16 1104 Last data filed at 09/09/16 1000  Gross per 24 hour  Intake          2106.99 ml  Output              980 ml  Net          1126.99 ml    Exam:  General: eomi ncat Cardiovascular:  s1 s2 no m/r/g Respiratory: clear, AE good Abdomen: soft nt nd no rebound no gaurd Skin wound wrapped in dressing an with boot Neuro intact moving all 4 limbs without deficit  Data Reviewed: Basic Metabolic Panel:  Recent Labs Lab 09/07/16 1608 09/08/16 0615 09/09/16 0506  NA 132* 136 138  K 4.4 4.3 4.4  CL 101 108 110  CO2 22 20* 21*  GLUCOSE 150* 127* 114*  BUN 51* 46* 38*  CREATININE 2.90* 2.64* 2.14*  CALCIUM 8.7* 8.5* 8.6*   Liver Function Tests:  Recent Labs Lab 09/07/16 1608 09/08/16 0615  AST 22 20  ALT 17 15*  ALKPHOS 77 69   BILITOT 0.5 0.5  PROT 6.9 6.5  ALBUMIN 2.7* 2.5*   No results for input(s): LIPASE, AMYLASE in the last 168 hours. No results for input(s): AMMONIA in the last 168 hours. CBC:  Recent Labs Lab 09/07/16 1608 09/08/16 0615 09/09/16 0506  WBC 9.7 9.3 8.3  NEUTROABS 7.3  --  5.5  HGB 9.5* 9.2* 9.0*  HCT 29.4* 28.9* 28.1*  MCV 96.4 96.0 96.2  PLT 241 256 246   Cardiac Enzymes: No results for input(s): CKTOTAL, CKMB, CKMBINDEX, TROPONINI in the last 168 hours. BNP: Invalid input(s): POCBNP CBG: No results for input(s): GLUCAP in the last 168 hours.  Recent Results (from the past 240 hour(s))  Culture, blood (single)     Status: None (Preliminary result)   Collection Time: 09/07/16  3:53 PM  Result Value Ref Range Status   Specimen Description BLOOD LEFT ANTECUBITAL  Final   Special Requests BOTTLES DRAWN AEROBIC AND ANAEROBIC 5 CC  Final   Culture NO GROWTH < 24 HOURS  Final   Report Status PENDING  Incomplete  Aerobic/Anaerobic Culture (surgical/deep wound)  Status: None (Preliminary result)   Collection Time: 09/08/16  9:17 PM  Result Value Ref Range Status   Specimen Description WOUND RIGHT FOOT  Final   Special Requests PATIENT ON FOLLOWING VANC  Final   Gram Stain   Final    FEW WBC PRESENT,BOTH PMN AND MONONUCLEAR FEW GRAM POSITIVE COCCI IN PAIRS    Culture PENDING  Incomplete   Report Status PENDING  Incomplete     Studies:              All Imaging reviewed and is as per above notation   Scheduled Meds: . amLODipine  5 mg Oral Daily  . aspirin EC  81 mg Oral Daily  . cholecalciferol  2,000 Units Oral Daily  . ferrous sulfate  325 mg Oral BID WC  . heparin  5,000 Units Subcutaneous Q8H  . levothyroxine  150 mcg Oral Once per day on Mon Tue Wed Thu Fri Sat  . [START ON 09/14/2016] levothyroxine  225 mcg Oral Once per day on Sun  . multivitamin with minerals  1 tablet Oral Daily  . pravastatin  40 mg Oral QHS  . sodium chloride flush  3 mL Intravenous  Q12H  . vancomycin  1,500 mg Intravenous Q48H  . vitamin C  500 mg Oral BID   Continuous Infusions: . sodium chloride 50 mL/hr at 09/09/16 0950  . sodium chloride 10 mL/hr at 09/08/16 1627     Assessment/Plan:  Rt 1st MT osteo/cellulitis-cont Vanc IV.  S/p surgery 10/30-up with therapy today-await Deep cultures-probably require long term IV therapies pending cultures/BC is pending as well.  Await preliminary results and could place PICC in next 24 hrs [ordered 10/31 for 09/10/16] Gout-allopurinol on hold Htn-Cont amlodipine 5, Losartan 50 held 2/2 to AKI.   CKD IV-Baseline creat ~ 2.2/GFR 18.  Renal function close to baseline-NSIV  AoCD-blood counts stable 9-10 range. Cont oral Ferrous.  Hyponatremia on admission resolved with IVF Hypothyroid-cont synthroid 100 mcg qd, TSH 23 mo ago wnl 0.78 Pre-DM-A1C this admit 6.3.  Might benefit from OP further counselling and initiation DM meds. Sugars controlled below 150 Met Renal cell Ca-->lungs-follow up Dr. Alen Blew as OP    Daughter updated   10/31 [phone]  [phone]   Full code 3-4 days Therapy as per podiatry  [name], MD  [facility] Pager [phone] 09/09/2016, 11:04 AM    LOS: 2 days

## 2016-09-09 NOTE — Op Note (Signed)
09/07/2016 - 09/08/2016  9:31 PM  PATIENT:  [name]  82 y.o. male  PRE-OPERATIVE DIAGNOSIS:  Osteomyelitis Right Toe. Hallux abductovalgus right foot. Cellulitis right foot. Abscess right foot.   POST-OPERATIVE DIAGNOSIS:  Questionable Osteomyelitis Right foot  PROCEDURE:  Procedure(s): 1. Arthroplasty of First MPJ RIGHT FOOT (right) 2. Sesmoidectomy RIGHT FOOT (right) 3. Incision and drainage-complicated RIGHT FOOT (right) 4. Placement of antibiotic beads RIGHT FOOT (right) 5. DEBRIDEMENT RIGHT ANKLE WOUND (Right)  SURGEON:  Surgeon(s) and Role:    * Edrick Kins, DPM - Primary  PHYSICIAN ASSISTANT:   ASSISTANTS: none   ANESTHESIA:   local and general  EBL:  Total I/O In: 600 [I.V.:600] Out: 5 [Blood:5]  BLOOD ADMINISTERED:none  DRAINS: none and None   LOCAL MEDICATIONS USED:  MARCAINE     SPECIMEN:  Source of Specimen:  Bone right foot. Deep wound culture right foot.   DISPOSITION OF SPECIMEN:  Bone RT foot.   COUNTS:  YES  TOURNIQUET:   Total Tourniquet Time Documented: Calf (Right) - 54 minutes Total: Calf (Right) - 54 minutes   JUSTIFICATION FOR PROCEDURE: This is an 82 year old male who presents as inpatient to the [facility] for evaluation of exposed bone to the right foot. Patient has been under the care of Dr. Con Memos at the wound care center, who referred the patient for a surgical consult to podiatry.  Surgical intervention recommended due to exposed bone and high likelihood of osteomyelitis. Conservative management has been unsuccessful in providing any sore satisfactory alleviation of symptoms for the patient.  The patient was told benefits as well as possible side effects of the procedure. The patient consented for surgical correction. The patient consent form was reviewed all patient questions were answered were expressed or implied. The patient and the surgeon boson the patient consent form with the witness  present and placed in the patient's chart.  Patient understands that this is a salvage type procedure to resect all osteomyelitis and perform incision and drainage of the cellulitis and abscesses within the right foot.  PROCEDURE IN DETAIL: The patient was brought to the operating room placed the operating table in a supine position at which time an aseptic scrub and drape were performed about the patient's right lower extremity after anesthesia was induced as described above. Attention was then directed to the medial aspect of the patient's right forefoot where procedure #1 commenced.  PROCEDURE #1: ARTHROPLASTY 1ST METATARSAL PHALANGEAL JOINT RIGHT FOOT A linear longitudinal elliptical type incision was planned and made overlying the exposed bone of the medial eminence of the first metatarsal head. The incision was carried down to the level of bone and the ellipsed wedges skin was completely removed with all necrotic nonviable tissue. The incision was approximately 5 cm in length x 2 cm in width. After excision of necrotic tissue with the ellipsed wedges skin all soft tissue and periosteal tissue was reflected away from the head of the first metatarsal as well as the base of the proximal phalanx preparation for the ensuing osteotomies. A transverse osteotomy was performed approximately the midshaft of the first metatarsal proximal to all necrotic nonviable bone. The entire distal aspect of the first metatarsal was removed in toto and placed in a sterile specimen container and sent to pathology. Another transverse osteotomy was performed approximately 1 cm distal to the base of the proximal phalanx distal to all necrotic nonviable bone and necrotic joint. The base of the proximal phalanx was then sharply resected away  using a #15 blade was removed in toto and placed in a sterile specimen container and sent to pathology. The above-mentioned osteotomies were performed using a #38 blade on a sagittal  saw. After completely arthroplasty of the first metatarsal phalangeal joint of the right foot, attention was then directed to the sesamoidal apparatus of the right foot where procedure #2 commenced.  PROCEDURE#2: SESMOIDECTOMY RIGHT FOOT. Intraoperative inspection evaluation of the sesamoidal apparatus indicated that there was osteolytic signs and nonviable bone of the sesamoids. Intraoperative decision was made to perform a sesamoidectomy of the right foot. At this time careful sharp dissection was utilized using a #15 blade to remove all soft tissue structures surrounding the proximal aspect of the sesamoidal apparatus. After all soft tissue structures were removed proximally attention was directed distal to the sesamoidal apparatus were all soft tissue structures were sharply dissected distally. The sesamoidal apparatus was then removed in toto and placing sterile specimen container and sent to pathology.  PROCEDURE #3: INCISION AND DRAINAGE RIGHT FOOT After completely arthroplasty of the first MPJ as well as the sesamoidectomy attention was then directed to all soft tissue structures within the right foot. Careful blunt dissection was utilized to explore all soft tissue structures and perforate all deep tissue abscesses. Expression of the foot was performed and all purulent drainage was expressed. At this time deep wound cultures were taken both anaerobic and aerobic. After completing incision and drainage of the right foot compartments and deep wound cultures, copious irrigation of the incision site was performed using pulse lavage and 3 L of normal saline.  PROCEDURE #4: PLACEMENT OF ANTIBIOTIC BEADS RIGHT FOOT After copious irrigation using pulse lavage the incision site and open wound was then dried in preparation for antibiotic bead application. Stimulan absorbable calcium sulfate beads mixed with approximately 500 mg of vancomycin powder and 600 mg of tobramycin powder were inserted into the  open arthroplasty site of the right foot.  After placement of antibiotic beads the open incision site was then prepared for soft tissue closure. 3-0 Prolene suture was utilized to reapproximate superficial skin edges. The central portion of the incision site was packed with 1/2 inch iodoform packing.  PROCEDURE #5: DEBRIDEMENT RIGHT ANKLE ULCER Medically necessary excisional debridement including muscle and fascia was performed to the right lateral ankle ulceration secondary to venous insufficiency and pressure using a tissue nipper and rongeur. Excisional debridement of all the necrotic nonviable tissue down to healthy bleeding viable tissue was performed with post-debridement measurements same as pre-. The lateral ankle wound measures 2.5 cm in length 2.5 cm in width 0.3 cm in depth. To the noted ulceration there is no extensive amount of slough, fibrin, and necrotic tissue noted. There is no eschar. Granulation tissue is red. Wound base is mostly yellow. There is a moderate amount of serous drainage noted. There is no malodor. The periwound integrity is intact.  Dry sterile dressings were then applied to the ankle ulceration and the previously mentioned incision site about the right medial forefoot. The right calf tourniquet which was used for hemostasis was deflated. All normal neurovascular responses including pink color and warmth returned all the digits of the patient's right lower extremity.  The patient was then transferred from the operating room to the recovery room having tolerated the procedure and anesthesia well. All vital signs are stable. After preceding recovery room the patient was readmitted to his room with prescriptions for adequate analgesic care for in hospital use. Verbal as well as written instructions were  provided to the patient and family regarding wound care. The patient is to keep the dressings clean dry and intact. The patient is also to be minimally weightbearing to the  right lower extremity in a postoperative shoe.  SURGICAL FINDINGS: After all procedures and removal of all necrotic bone, the surgical margins appear healthy and viable. At the moment, there appears to be no need for long-term IV antibiotics. Careful consideration needs to be given to the fact that the patient only has one kidney and renal function must be monitored closely. At this time, the risks of renal impairment due to long-term IV antibiotics seem to outweigh the benefits and medical necessity.  Healthy bleeding was also observed indicating an intact vascular status.  PLAN OF CARE: Inpatient. Return to room.  PATIENT DISPOSITION:  PACU - hemodynamically stable.   Delay start of Pharmacological VTE agent (>24hrs) due to surgical blood loss or risk of bleeding: N/A

## 2016-09-09 NOTE — Progress Notes (Signed)
Pharmacy Antibiotic Note  [name] is a 82 y.o. male admitted on 09/07/2016 with a persistent R-foot infection/cellulitis. Pharmacy has been consulted for Vancomycin dosing.   SCr has imprroved to 2.14, normalized CrCl ~25-63m/min. Note plans for PICC placement tomorrow.  Plan: - Vancomycin 1500 mg IV q48h - Will continue to follow renal function, culture results, LOT, and antibiotic de-escalation plans   Height: '5\' 9"'$  (175.3 cm) Weight: 242 lb 15.2 oz (110.2 kg) IBW/kg (Calculated) : 70.7  Temp (24hrs), Avg:97.9 F (36.6 C), Min:97 F (36.1 C), Max:99.1 F (37.3 C)   Recent Labs Lab 09/07/16 1603 09/07/16 1608 09/08/16 0615 09/09/16 0506  WBC  --  9.7 9.3 8.3  CREATININE  --  2.90* 2.64* 2.14*  LATICACIDVEN 1.63  --   --   --     Estimated Creatinine Clearance: 30.9 mL/min (by C-G formula based on SCr of 2.14 mg/dL (H)).    No Known Allergies  Antimicrobials this admission: Vanc 10/29 >>  Dose adjustments this admission: N/a  Microbiology results: 10/29 BCx x1: ngtd 10/30 deep wound: few GPC  Thank you for allowing pharmacy to be a part of this patient's care.  Asaph Serena D. Luccia Reinheimer, PharmD, BCPS Clinical Pharmacist Pager: [phone]/31/2017 2:37 PM

## 2016-09-09 NOTE — Progress Notes (Signed)
Orthopedic Tech Progress Note Patient Details:  [name] 06/28/1929 [id number]  Ortho Devices Type of Ortho Device: Postop shoe/boot Ortho Device/Splint Interventions: Application   [place] Pink 09/09/2016, 9:53 AM

## 2016-09-09 NOTE — Evaluation (Signed)
Physical Therapy Evaluation Patient Details Name: [name] MRN: [id number] DOB: [birth year] Today's Date: 09/09/2016   History of Present Illness  [name] is a 82 y.o. male with medical history significant of HTN, HLD, Gout, Renal Cell Carcinoma s/p L Nephrectomy with Mets to Lung s/p Radiation Tx, IDA and Anemia of Chronic Disease, CKD Stage 3-4, Difficulty Hearing, GERD, CAD, and other comorbids who presents to the ER with a non-healing medial Right 1st MP joint and Lateral Ankle Ulcer.  S/P I and D or R great toe MP jt and addition of abx beads, plus I and D of ankle wound.   Clinical Impression  Pt admitted with/for non healing R 1st MP jt, s/p I and D with abx beads.  Pt at least a max assist transfer bed to/from chair keeping most of his weight of the R foot..  Pt currently limited functionally due to the problems listed. ( See problems list.)   Pt will benefit from PT to maximize function and safety in order to get ready for next venue listed below.     Follow Up Recommendations SNF    Equipment Recommendations  None recommended by PT    Recommendations for Other Services       Precautions / Restrictions Precautions Precautions: Fall Restrictions Weight Bearing Restrictions: Yes RLE Weight Bearing: Touchdown weight bearing      Mobility  Bed Mobility               General bed mobility comments: not tested, pt in the recliner  Transfers Overall transfer level: Needs assistance   Transfers: Sit to/from Stand Sit to Stand: Max assist         General transfer comment: unable to stand from low recliner surface with a walker.  Used facd to face transfer method and maximal assist .  "We" built up his recliner seating surface to potentially make it easier for staff to get him back to bed.  Ambulation/Gait             General Gait Details: not appropriate at this time  Stairs            Wheelchair Mobility    Modified Rankin (Stroke  Patients Only)       Balance Overall balance assessment: Needs assistance Sitting-balance support: Single extremity supported;Bilateral upper extremity supported Sitting balance-Leahy Scale: Fair       Standing balance-Leahy Scale: Poor                               Pertinent Vitals/Pain Pain Assessment: Faces Faces Pain Scale: Hurts little more Pain Location: R toe Pain Descriptors / Indicators: Sore Pain Intervention(s): Monitored during session;Repositioned    Home Living Family/patient expects to be discharged to:: Private residence Living Arrangements: Alone Available Help at Discharge: Available PRN/intermittently;Family (daughter gets groceries) Type of Home: Other(Comment) (patio home) Home Access: Stairs to enter   Technical brewer of Steps: 1 Home Layout: One level Home Equipment: Clinical cytogeneticist - 2 wheels      Prior Function Level of Independence: Independent with assistive device(s)               Hand Dominance        Extremity/Trunk Assessment               Lower Extremity Assessment: RLE deficits/detail;LLE deficits/detail RLE Deficits / Details: grossly >4/5, but painful knee LLE Deficits / Details: grossly 4-  to 4/5 and painful     Communication   Communication: No difficulties;HOH  Cognition Arousal/Alertness: Awake/alert Behavior During Therapy: WFL for tasks assessed/performed Overall Cognitive Status: Within Functional Limits for tasks assessed                      General Comments      Exercises     Assessment/Plan    PT Assessment Patient needs continued PT services  PT Problem List Decreased strength;Decreased activity tolerance;Decreased balance;Decreased mobility;Decreased knowledge of use of DME;Pain          PT Treatment Interventions DME instruction;Functional mobility training;Therapeutic activities;Balance training;Patient/family education    PT Goals (Current goals can be  found in the Care Plan section)  Acute Rehab PT Goals Patient Stated Goal: be able to go back home independently PT Goal Formulation: With patient Time For Goal Achievement: 09/23/16 Potential to Achieve Goals: Fair    Frequency Min 3X/week   Barriers to discharge Decreased caregiver support lives alone, daughter comes by daily    Co-evaluation               End of Session Equipment Utilized During Treatment: Gait belt Activity Tolerance: Patient tolerated treatment well Patient left: in chair;with call bell/phone within reach;Other (comment) (with blankets to raise the seating surface)      Functional Limitation: Mobility: Walking and moving around Mobility: Walking and Moving Around Current Status [phone]): At least 40 percent but less than 60 percent impaired, limited or restricted Mobility: Walking and Moving Around Goal Status [phone]): At least 20 percent but less than 40 percent impaired, limited or restricted    Time: 1235-1304 PT Time Calculation (min) (ACUTE ONLY): 29 min   Charges:   PT Evaluation $PT Eval Moderate Complexity: 1 Procedure PT Treatments $Therapeutic Activity: 8-22 mins   PT G Codes:   PT G-Codes **NOT FOR INPATIENT CLASS** Functional Limitation: Mobility: Walking and moving around Mobility: Walking and Moving Around Current Status ([id number]): At least 40 percent but less than 60 percent impaired, limited or restricted Mobility: Walking and Moving Around Goal Status [phone]): At least 20 percent but less than 40 percent impaired, limited or restricted    Christorpher Hisaw, Tessie Fass 09/09/2016, 1:34 PM 09/09/2016  Donnella Sham, PT [phone]  (pager)

## 2016-09-10 DIAGNOSIS — N184 Chronic kidney disease, stage 4 (severe): Secondary | ICD-10-CM

## 2016-09-10 DIAGNOSIS — E039 Hypothyroidism, unspecified: Secondary | ICD-10-CM

## 2016-09-10 DIAGNOSIS — M86171 Other acute osteomyelitis, right ankle and foot: Secondary | ICD-10-CM

## 2016-09-10 DIAGNOSIS — I10 Essential (primary) hypertension: Secondary | ICD-10-CM

## 2016-09-10 DIAGNOSIS — L03115 Cellulitis of right lower limb: Secondary | ICD-10-CM

## 2016-09-10 LAB — BASIC METABOLIC PANEL
Anion gap: 7 (ref 5–15)
BUN: 29 mg/dL — AB (ref 4–21)
BUN: 29 mg/dL — AB (ref 6–20)
CALCIUM: 8.7 mg/dL — AB (ref 8.9–10.3)
CHLORIDE: 111 mmol/L (ref 101–111)
CO2: 20 mmol/L — AB (ref 22–32)
CREATININE: 1.9 mg/dL — AB (ref 0.6–1.3)
CREATININE: 1.9 mg/dL — AB (ref 0.61–1.24)
GFR calc non Af Amer: 31 mL/min — ABNORMAL LOW (ref >60)
GFR, EST AFRICAN AMERICAN: 35 mL/min — AB (ref >60)
GLUCOSE: 120 mg/dL
Glucose, Bld: 120 mg/dL — ABNORMAL HIGH (ref 65–99)
Potassium: 4.6 mmol/L (ref 3.4–5.3)
Potassium: 4.6 mmol/L (ref 3.5–5.1)
Sodium: 138 mmol/L (ref 135–145)
Sodium: 138 mmol/L (ref 137–147)

## 2016-09-10 MED ORDER — AMLODIPINE BESYLATE 10 MG PO TABS
10.0000 mg | ORAL_TABLET | Freq: Every day | ORAL | Status: DC
  Administered 2016-09-10 – 2016-09-12 (×3): 10 mg via ORAL
  Filled 2016-09-10 (×3): qty 1

## 2016-09-10 NOTE — Telephone Encounter (Signed)
Patient was admitted and surgery was performed by Dr.  Amalia Hailey on 09/08/2016.

## 2016-09-10 NOTE — Progress Notes (Signed)
Physical Therapy Treatment Patient Details Name: [name] MRN: [id number] DOB: [birth year] Today's Date: 09/10/2016    History of Present Illness [name] is a 82 y.o. male with medical history significant of HTN, HLD, Gout, Renal Cell Carcinoma s/p L Nephrectomy with Mets to Lung s/p Radiation Tx, IDA and Anemia of Chronic Disease, CKD Stage 3-4, Difficulty Hearing, GERD, CAD, and other comorbids who presents to the ER with a non-healing medial Right 1st MP joint and Lateral Ankle Ulcer.  S/P I and D or R great toe MP jt and addition of abx beads, plus I and D of ankle wound.     PT Comments    Making slow progress with mobility, limited by minimal w/bearing, weakness and pain.  Follow Up Recommendations  SNF     Equipment Recommendations  None recommended by PT    Recommendations for Other Services       Precautions / Restrictions Precautions Precautions: Fall Restrictions Weight Bearing Restrictions: Yes RLE Weight Bearing: Touchdown weight bearing    Mobility  Bed Mobility Overal bed mobility: Needs Assistance Bed Mobility: Supine to Sit     Supine to sit: Mod assist     General bed mobility comments: up via R elbow with mod supportive assist to come forward.  Transfers Overall transfer level: Needs assistance   Transfers: Sit to/from Stand Sit to Stand: Mod assist;+2 physical assistance         General transfer comment: raised bed to raised seating surface on the recliner.  Approximently 10 steps forward and pivoting to line up with chair  Ambulation/Gait             General Gait Details: pivot only   Stairs            Wheelchair Mobility    Modified Rankin (Stroke Patients Only)       Balance     Sitting balance-Leahy Scale: Fair       Standing balance-Leahy Scale: Poor                      Cognition Arousal/Alertness: Awake/alert Behavior During Therapy: WFL for tasks assessed/performed Overall  Cognitive Status: Within Functional Limits for tasks assessed                      Exercises Other Exercises Other Exercises: Warm up knee ROM exercise active/active assist    General Comments        Pertinent Vitals/Pain Pain Assessment: Faces Faces Pain Scale: Hurts little more Pain Location: toe, knees Pain Descriptors / Indicators: Aching;Grimacing;Sore Pain Intervention(s): Monitored during session;Limited activity within patient's tolerance    Home Living                      Prior Function            PT Goals (current goals can now be found in the care plan section) Acute Rehab PT Goals Patient Stated Goal: be able to go back home independently PT Goal Formulation: With patient Time For Goal Achievement: 09/23/16 Potential to Achieve Goals: Fair Progress towards PT goals: Progressing toward goals    Frequency    Min 3X/week      PT Plan Current plan remains appropriate    Co-evaluation             End of Session Equipment Utilized During Treatment: Gait belt Activity Tolerance: Patient tolerated treatment well Patient left: in chair;with call  bell/phone within reach;Other (comment)     Time: [id number] PT Time Calculation (min) (ACUTE ONLY): 20 min  Charges:  $Therapeutic Activity: 8-22 mins                    G Codes:      Cloyce Paterson, Tessie Fass 09/10/2016, 12:32 PM 09/10/2016  Donnella Sham, [place] [phone]  (pager)

## 2016-09-10 NOTE — Progress Notes (Signed)
PROGRESS NOTE    [name]  WGY:[id number] DOB: 1929/08/12 DOA: 09/07/2016 PCP: Alesia Richards, MD    Brief Narrative:  Patient is a 82 year old gentleman with history of coronary artery disease status post stenting 2009, hypertension, hyperlipidemia, history of left-sided nephrectomy secondary to renal cell carcinoma with metastases to the lungs, chronic kidney disease stage IV presenting with right great toe osteomyelitis. Podiatry following.   Assessment & Plan:   Principal Problem:   Foot ulcer ([place]) Active Problems:   Cancer of kidney (HCC)   GERD (gastroesophageal reflux disease)   Hypertension   CKD, stage IV (GFR 28 ml/min) (HCC)   Hypothyroidism   Hyperlipidemia   CAD (coronary artery disease), native coronary artery   Obesity (BMI 30-39.9)   Gout   Cellulitis of right foot   Acute osteomyelitis of right foot ([place])  #1 acute osteomyelitis right great toe/right foot cellulitis Patient status post arthroplasty of first MPJ right foot, sesamoidectomy right foot, incision and drainage with placement of antibiotic beads of the right foot with debridement of the right ankle per Dr. Amalia Hailey of podiatry. Patient on IV vancomycin. Surgical cultures pending. Surgical pathology consistent with osteomyelitis and negative for malignancy. Once surgical cultures have resulted will narrow down IV antibiotics to an additional week of oral antibiotics preferably Augmentin if susceptible. Podiatry following and appreciate input and recommendations.  #2 hypertension Losartan held secondary to acute kidney injury. Patient status post history of nephrectomy. Increase Norvasc to 10 mg daily. Follow.  #3 hypothyroidism Continue Synthroid.  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 Alen Blew.  #5 anemia of chronic disease H&H stable.  #6 chronic kidney disease stage IV Currently stable. Monitor.   DVT prophylaxis: Heparin Code  Status: Full Family Communication: Updated patient. No family at bedside. Disposition Plan: Skilled nursing facility once culture results are back and medically stable hopefully in 1-2 days.   Consultants:   Podiatry: Dr. Amalia Hailey  Procedures:   MRI right foot 09/07/2016  Plain FILMS The right foot 09/07/2016, 09/10/2016  Arthroplasty of first MPJ right foot, sesamoidectomy right foot, incision and drainage-complicated right foot, placement of antibiotic beads right foot, debridement right ankle----per Dr. Amalia Hailey 09/09/2016  Antimicrobials:   IV vancomycin 09/09/2016>>>   Subjective: Patient denies any chest pain. No shortness of breath. States he feels lousy.  Objective: Vitals:   09/09/16 1800 09/09/16 2300 09/10/16 0529 09/10/16 0932  BP: (!) 168/63 (!) 175/61 (!) 171/61 (!) 176/62  Pulse: 95 97 99 98  Resp: '18 18 16 18  '$ Temp: 98 F (36.7 C) 98.6 F (37 C) 98.1 F (36.7 C) 98.9 F (37.2 C)  TempSrc: Oral Oral Oral Oral  SpO2: 97% 94% 100% 97%  Weight:      Height:        Intake/Output Summary (Last 24 hours) at 09/10/16 1320 Last data filed at 09/10/16 1240  Gross per 24 hour  Intake          1771.67 ml  Output              975 ml  Net           796.67 ml   Filed Weights   09/07/16 1702 09/07/16 2152 09/08/16 2243  Weight: 115.2 kg (254 lb) 115.2 kg (253 lb 15.5 oz) 110.2 kg (242 lb 15.2 oz)    Examination:  General exam: Appears calm and comfortable  Respiratory system: Clear to auscultation. Respiratory effort normal. Cardiovascular system: S1 & S2  heard, RRR. No JVD, murmurs, rubs, gallops or clicks. No pedal edema. Gastrointestinal system: Abdomen is nondistended, soft and nontender. No organomegaly or masses felt. Normal bowel sounds heard. Central nervous system: Alert and oriented. No focal neurological deficits. Extremities: Right lower extremity is bandaged.  Skin: No rashes, lesions or ulcers Psychiatry: Judgement and insight appear normal. Mood  & affect appropriate.     Data Reviewed: I have personally reviewed following labs and imaging studies  CBC:  Recent Labs Lab 09/07/16 1608 09/08/16 0615 09/09/16 0506  WBC 9.7 9.3 8.3  NEUTROABS 7.3  --  5.5  HGB 9.5* 9.2* 9.0*  HCT 29.4* 28.9* 28.1*  MCV 96.4 96.0 96.2  PLT 241 256 694   Basic Metabolic Panel:  Recent Labs Lab 09/07/16 1608 09/08/16 0615 09/09/16 0506 09/10/16 0532  NA 132* 136 138 138  K 4.4 4.3 4.4 4.6  CL 101 108 110 111  CO2 22 20* 21* 20*  GLUCOSE 150* 127* 114* 120*  BUN 51* 46* 38* 29*  CREATININE 2.90* 2.64* 2.14* 1.90*  CALCIUM 8.7* 8.5* 8.6* 8.7*   GFR: Estimated Creatinine Clearance: 34.8 mL/min (by C-G formula based on SCr of 1.9 mg/dL (H)). Liver Function Tests:  Recent Labs Lab 09/07/16 1608 09/08/16 0615  AST 22 20  ALT 17 15*  ALKPHOS 77 69  BILITOT 0.5 0.5  PROT 6.9 6.5  ALBUMIN 2.7* 2.5*   No results for input(s): LIPASE, AMYLASE in the last 168 hours. No results for input(s): AMMONIA in the last 168 hours. Coagulation Profile: No results for input(s): INR, PROTIME in the last 168 hours. Cardiac Enzymes: No results for input(s): CKTOTAL, CKMB, CKMBINDEX, TROPONINI in the last 168 hours. BNP (last 3 results) No results for input(s): PROBNP in the last 8760 hours. HbA1C:  Recent Labs  09/07/16 1910  HGBA1C 6.3*   CBG: No results for input(s): GLUCAP in the last 168 hours. Lipid Profile: No results for input(s): CHOL, HDL, LDLCALC, TRIG, CHOLHDL, LDLDIRECT in the last 72 hours. Thyroid Function Tests: No results for input(s): TSH, T4TOTAL, FREET4, T3FREE, THYROIDAB in the last 72 hours. Anemia Panel: No results for input(s): VITAMINB12, FOLATE, FERRITIN, TIBC, IRON, RETICCTPCT in the last 72 hours. Sepsis Labs:  Recent Labs Lab 09/07/16 1603  LATICACIDVEN 1.63    Recent Results (from the past 240 hour(s))  Culture, blood (single)     Status: None (Preliminary result)   Collection Time: 09/07/16   3:53 PM  Result Value Ref Range Status   Specimen Description BLOOD LEFT ANTECUBITAL  Final   Special Requests BOTTLES DRAWN AEROBIC AND ANAEROBIC 5 CC  Final   Culture NO GROWTH 2 DAYS  Final   Report Status PENDING  Incomplete  Aerobic/Anaerobic Culture (surgical/deep wound)     Status: None (Preliminary result)   Collection Time: 09/08/16  9:17 PM  Result Value Ref Range Status   Specimen Description WOUND RIGHT FOOT  Final   Special Requests PATIENT ON FOLLOWING VANC  Final   Gram Stain   Final    FEW WBC PRESENT,BOTH PMN AND MONONUCLEAR FEW GRAM POSITIVE COCCI IN PAIRS    Culture NO GROWTH < 24 HOURS  Final   Report Status PENDING  Incomplete         Radiology Studies: Dg Foot Complete Right  Result Date: 09/10/2016 CLINICAL DATA:  Postoperative radiograph of the right foot. Initial encounter. EXAM: RIGHT FOOT COMPLETE - 3+ VIEW COMPARISON:  Right foot MRI performed 09/07/2016 FINDINGS: The patient is status post  resection of the distal first metatarsal and the base of the first proximal phalanx, with associated high-density beads. Visualized joint spaces are grossly unremarkable. Mild overlying soft tissue swelling is noted. Scattered vascular calcifications are seen. The subtalar joint is grossly unremarkable. Mild degenerative change is noted at the midfoot. IMPRESSION: 1. Status post resection of distal first metatarsal and base of the first proximal phalanx, with associated high-density beads. 2. Scattered vascular calcifications seen. Electronically Signed   By: Garald Balding M.D.   On: 09/10/2016 00:46        Scheduled Meds: . amLODipine  10 mg Oral Daily  . aspirin EC  81 mg Oral Daily  . cholecalciferol  2,000 Units Oral Daily  . ferrous sulfate  325 mg Oral BID WC  . heparin  5,000 Units Subcutaneous Q8H  . levothyroxine  150 mcg Oral Once per day on Mon Tue Wed Thu Fri Sat  . [START ON 09/14/2016] levothyroxine  225 mcg Oral Once per day on Sun  . multivitamin  with minerals  1 tablet Oral Daily  . pravastatin  40 mg Oral QHS  . sodium chloride flush  3 mL Intravenous Q12H  . vancomycin  1,500 mg Intravenous Q48H  . vitamin C  500 mg Oral BID   Continuous Infusions: . sodium chloride Stopped (09/09/16 0950)  . sodium chloride Stopped (09/09/16 0950)     LOS: 3 days    Time spent: 71 minutes    Damere Brandenburg, MD [facility] Pager [phone]  If 7PM-7AM, please contact night-coverage [email or web address] Password [email or web address] 09/10/2016, 1:20 PM

## 2016-09-11 DIAGNOSIS — K219 Gastro-esophageal reflux disease without esophagitis: Secondary | ICD-10-CM

## 2016-09-11 DIAGNOSIS — M869 Osteomyelitis, unspecified: Secondary | ICD-10-CM

## 2016-09-11 DIAGNOSIS — L97513 Non-pressure chronic ulcer of other part of right foot with necrosis of muscle: Secondary | ICD-10-CM

## 2016-09-11 DIAGNOSIS — E782 Mixed hyperlipidemia: Secondary | ICD-10-CM

## 2016-09-11 LAB — BASIC METABOLIC PANEL
ANION GAP: 8 (ref 5–15)
BUN: 30 mg/dL — AB (ref 4–21)
BUN: 30 mg/dL — ABNORMAL HIGH (ref 6–20)
CALCIUM: 9 mg/dL (ref 8.9–10.3)
CHLORIDE: 109 mmol/L (ref 101–111)
CO2: 21 mmol/L — AB (ref 22–32)
Creatinine, Ser: 1.85 mg/dL — ABNORMAL HIGH (ref 0.61–1.24)
Creatinine: 1.9 mg/dL — AB (ref 0.6–1.3)
GFR calc Af Amer: 37 mL/min — ABNORMAL LOW (ref >60)
GFR calc non Af Amer: 32 mL/min — ABNORMAL LOW (ref >60)
GLUCOSE: 105 mg/dL
GLUCOSE: 105 mg/dL — AB (ref 65–99)
POTASSIUM: 4.6 mmol/L (ref 3.4–5.3)
Potassium: 4.6 mmol/L (ref 3.5–5.1)
SODIUM: 138 mmol/L (ref 137–147)
Sodium: 138 mmol/L (ref 135–145)

## 2016-09-11 LAB — CBC
HEMATOCRIT: 29.3 % — AB (ref 39.0–52.0)
HEMOGLOBIN: 9.3 g/dL — AB (ref 13.0–17.0)
MCH: 30.2 pg (ref 26.0–34.0)
MCHC: 31.7 g/dL (ref 30.0–36.0)
MCV: 95.1 fL (ref 78.0–100.0)
Platelets: 277 10*3/uL (ref 150–400)
RBC: 3.08 MIL/uL — AB (ref 4.22–5.81)
RDW: 13.9 % (ref 11.5–15.5)
WBC: 9.4 10*3/uL (ref 4.0–10.5)

## 2016-09-11 LAB — CBC AND DIFFERENTIAL: WBC: 9.4 10^3/mL

## 2016-09-11 MED ORDER — CARVEDILOL 3.125 MG PO TABS
3.1250 mg | ORAL_TABLET | Freq: Two times a day (BID) | ORAL | Status: DC
  Administered 2016-09-11 – 2016-09-12 (×4): 3.125 mg via ORAL
  Filled 2016-09-11 (×4): qty 1

## 2016-09-11 MED ORDER — AMOXICILLIN-POT CLAVULANATE 875-125 MG PO TABS
1.0000 | ORAL_TABLET | Freq: Two times a day (BID) | ORAL | Status: DC
  Administered 2016-09-11 – 2016-09-12 (×2): 1 via ORAL
  Filled 2016-09-11 (×3): qty 1

## 2016-09-11 MED ORDER — GUAIFENESIN ER 600 MG PO TB12
600.0000 mg | ORAL_TABLET | Freq: Two times a day (BID) | ORAL | Status: DC
  Administered 2016-09-11 – 2016-09-12 (×3): 600 mg via ORAL
  Filled 2016-09-11 (×3): qty 1

## 2016-09-11 NOTE — Care Management Important Message (Signed)
Important Message  Patient Details  Name: [name] MRN: [id number] Date of Birth: [birth year]   Medicare Important Message Given:  Yes    Wilbon Obenchain [address] 09/11/2016, 9:47 AM

## 2016-09-11 NOTE — Progress Notes (Signed)
PROGRESS NOTE    [name]  JGG:[id number] DOB: 1929-10-10 DOA: 09/07/2016 PCP: Alesia Richards, MD    Brief Narrative:  Patient is a 82 year old gentleman with history of coronary artery disease status post stenting 2009, hypertension, hyperlipidemia, history of left-sided nephrectomy secondary to renal cell carcinoma with metastases to the lungs, chronic kidney disease stage IV presenting with right great toe osteomyelitis. Podiatry following.   Assessment & Plan:   Principal Problem:   Acute osteomyelitis of right foot ([place]) Active Problems:   Cancer of kidney (HCC)   GERD (gastroesophageal reflux disease)   Hypertension   CKD, stage IV (GFR 28 ml/min) (HCC)   Hypothyroidism   Hyperlipidemia   CAD (coronary artery disease), native coronary artery   Obesity (BMI 30-39.9)   Gout   Cellulitis of right foot   Foot ulcer ([place])   Osteomyelitis of right lower extremity [facility]): Right great toe  #1 acute osteomyelitis right great toe/right foot cellulitis Patient status post arthroplasty of first MPJ right foot, sesamoidectomy right foot, incision and drainage with placement of antibiotic beads of the right foot with debridement of the right ankle per Dr. Amalia Hailey of podiatry. Patient on IV vancomycin. Surgical cultures consistent with MSSA. Blood cultures with no growth to date. Surgical pathology consistent with osteomyelitis and negative for malignancy. Will discontinue IV vancomycin and placed on oral Augmentin for an additional week. Patient will need to follow-up with podiatry in the outpatient setting.   #2 hypertension Losartan held secondary to acute kidney injury. Patient status post history of nephrectomy. Increased Norvasc to 10 mg daily with better blood pressure control.. Follow.  #3 hypothyroidism Continue Synthroid.  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 Alen Blew.  #5 anemia of chronic disease H&H  stable.  #6 chronic kidney disease stage IV Currently stable. Monitor.  #7 cough Patient with a productive cough of yellowish sputum however patient is not hypoxic. Patient is afebrile. Patient does not have a leukocytosis and patient does not look septic. Patient was on IV vancomycin throughout the hospitalization. Patient has been transitioned to oral Augmentin for 1 more week. Will place on Mucinex.   DVT prophylaxis: Heparin Code Status: Full Family Communication: Updated patient and daughter at bedside. Disposition Plan: Skilled nursing facility hopefully in the next 24 hours.   Consultants:   Podiatry: Dr. Amalia Hailey  Procedures:   MRI right foot 09/07/2016  Plain FILMS The right foot 09/07/2016, 09/10/2016  Arthroplasty of first MPJ right foot, sesamoidectomy right foot, incision and drainage-complicated right foot, placement of antibiotic beads right foot, debridement right ankle----per Dr. Amalia Hailey 09/09/2016  Antimicrobials:   IV vancomycin 09/09/2016>>> 09/11/2016  Oral Augmentin 09/11/2016   Subjective: Patient denies any chest pain. No shortness of breath. States he feels lousy. Patient with a cough.  Objective: Vitals:   09/10/16 2151 09/11/16 0500 09/11/16 0922 09/11/16 1043  BP: (!) 162/64 (!) 169/62 102/71 138/75  Pulse: (!) 101 98 (!) 104 95  Resp: '17 17 18 17  '$ Temp: 97.6 F (36.4 C) 98.4 F (36.9 C) 98.5 F (36.9 C) 98.9 F (37.2 C)  TempSrc: Oral Oral Oral Oral  SpO2: 98% 98% 99% 98%  Weight: 117 kg (257 lb 15 oz)     Height:        Intake/Output Summary (Last 24 hours) at 09/11/16 1514 Last data filed at 09/11/16 1335  Gross per 24 hour  Intake  923 ml  Output              700 ml  Net              223 ml   Filed Weights   09/07/16 2152 09/08/16 2243 09/10/16 2151  Weight: 115.2 kg (253 lb 15.5 oz) 110.2 kg (242 lb 15.2 oz) 117 kg (257 lb 15 oz)    Examination:  General exam: Appears calm and comfortable  Respiratory system:  Clear to auscultation. Respiratory effort normal. Cardiovascular system: S1 & S2 heard, RRR. No JVD, murmurs, rubs, gallops or clicks. No pedal edema. Gastrointestinal system: Abdomen is nondistended, soft and nontender. No organomegaly or masses felt. Normal bowel sounds heard. Central nervous system: Alert and oriented. No focal neurological deficits. Extremities: Right lower extremity is bandaged.  Skin: No rashes, lesions or ulcers Psychiatry: Judgement and insight appear normal. Mood & affect appropriate.     Data Reviewed: I have personally reviewed following labs and imaging studies  CBC:  Recent Labs Lab 09/07/16 1608 09/08/16 0615 09/09/16 0506 09/11/16 0525  WBC 9.7 9.3 8.3 9.4  NEUTROABS 7.3  --  5.5  --   HGB 9.5* 9.2* 9.0* 9.3*  HCT 29.4* 28.9* 28.1* 29.3*  MCV 96.4 96.0 96.2 95.1  PLT 241 256 246 948   Basic Metabolic Panel:  Recent Labs Lab 09/07/16 1608 09/08/16 0615 09/09/16 0506 09/10/16 0532 09/11/16 0525  NA 132* 136 138 138 138  K 4.4 4.3 4.4 4.6 4.6  CL 101 108 110 111 109  CO2 22 20* 21* 20* 21*  GLUCOSE 150* 127* 114* 120* 105*  BUN 51* 46* 38* 29* 30*  CREATININE 2.90* 2.64* 2.14* 1.90* 1.85*  CALCIUM 8.7* 8.5* 8.6* 8.7* 9.0   GFR: Estimated Creatinine Clearance: 36.8 mL/min (by C-G formula based on SCr of 1.85 mg/dL (H)). Liver Function Tests:  Recent Labs Lab 09/07/16 1608 09/08/16 0615  AST 22 20  ALT 17 15*  ALKPHOS 77 69  BILITOT 0.5 0.5  PROT 6.9 6.5  ALBUMIN 2.7* 2.5*   No results for input(s): LIPASE, AMYLASE in the last 168 hours. No results for input(s): AMMONIA in the last 168 hours. Coagulation Profile: No results for input(s): INR, PROTIME in the last 168 hours. Cardiac Enzymes: No results for input(s): CKTOTAL, CKMB, CKMBINDEX, TROPONINI in the last 168 hours. BNP (last 3 results) No results for input(s): PROBNP in the last 8760 hours. HbA1C: No results for input(s): HGBA1C in the last 72 hours. CBG: No  results for input(s): GLUCAP in the last 168 hours. Lipid Profile: No results for input(s): CHOL, HDL, LDLCALC, TRIG, CHOLHDL, LDLDIRECT in the last 72 hours. Thyroid Function Tests: No results for input(s): TSH, T4TOTAL, FREET4, T3FREE, THYROIDAB in the last 72 hours. Anemia Panel: No results for input(s): VITAMINB12, FOLATE, FERRITIN, TIBC, IRON, RETICCTPCT in the last 72 hours. Sepsis Labs:  Recent Labs Lab 09/07/16 1603  LATICACIDVEN 1.63    Recent Results (from the past 240 hour(s))  Culture, blood (single)     Status: None (Preliminary result)   Collection Time: 09/07/16  3:53 PM  Result Value Ref Range Status   Specimen Description BLOOD LEFT ANTECUBITAL  Final   Special Requests BOTTLES DRAWN AEROBIC AND ANAEROBIC 5 CC  Final   Culture NO GROWTH 4 DAYS  Final   Report Status PENDING  Incomplete  Aerobic/Anaerobic Culture (surgical/deep wound)     Status: None (Preliminary result)   Collection Time: 09/08/16  9:17 PM  Result  Value Ref Range Status   Specimen Description WOUND RIGHT FOOT  Final   Special Requests PATIENT ON FOLLOWING VANC  Final   Gram Stain   Final    FEW WBC PRESENT,BOTH PMN AND MONONUCLEAR FEW GRAM POSITIVE COCCI IN PAIRS    Culture   Final    RARE STAPHYLOCOCCUS AUREUS NO ANAEROBES ISOLATED; CULTURE IN PROGRESS FOR 5 DAYS    Report Status PENDING  Incomplete   Organism ID, Bacteria STAPHYLOCOCCUS AUREUS  Final      Susceptibility   Staphylococcus aureus - MIC*    CIPROFLOXACIN <=0.5 SENSITIVE Sensitive     ERYTHROMYCIN <=0.25 SENSITIVE Sensitive     GENTAMICIN <=0.5 SENSITIVE Sensitive     OXACILLIN <=0.25 SENSITIVE Sensitive     TETRACYCLINE >=16 RESISTANT Resistant     VANCOMYCIN <=0.5 SENSITIVE Sensitive     TRIMETH/SULFA <=10 SENSITIVE Sensitive     CLINDAMYCIN <=0.25 SENSITIVE Sensitive     RIFAMPIN <=0.5 SENSITIVE Sensitive     Inducible Clindamycin NEGATIVE Sensitive     * RARE STAPHYLOCOCCUS AUREUS         Radiology  Studies: Dg Foot Complete Right  Result Date: 09/10/2016 CLINICAL DATA:  Postoperative radiograph of the right foot. Initial encounter. EXAM: RIGHT FOOT COMPLETE - 3+ VIEW COMPARISON:  Right foot MRI performed 09/07/2016 FINDINGS: The patient is status post resection of the distal first metatarsal and the base of the first proximal phalanx, with associated high-density beads. Visualized joint spaces are grossly unremarkable. Mild overlying soft tissue swelling is noted. Scattered vascular calcifications are seen. The subtalar joint is grossly unremarkable. Mild degenerative change is noted at the midfoot. IMPRESSION: 1. Status post resection of distal first metatarsal and base of the first proximal phalanx, with associated high-density beads. 2. Scattered vascular calcifications seen. Electronically Signed   By: Garald Balding M.D.   On: 09/10/2016 00:46        Scheduled Meds: . amLODipine  10 mg Oral Daily  . amoxicillin-clavulanate  1 tablet Oral Q12H  . aspirin EC  81 mg Oral Daily  . carvedilol  3.125 mg Oral BID WC  . cholecalciferol  2,000 Units Oral Daily  . ferrous sulfate  325 mg Oral BID WC  . guaiFENesin  600 mg Oral BID  . heparin  5,000 Units Subcutaneous Q8H  . levothyroxine  150 mcg Oral Once per day on Mon Tue Wed Thu Fri Sat  . [START ON 09/14/2016] levothyroxine  225 mcg Oral Once per day on Sun  . multivitamin with minerals  1 tablet Oral Daily  . pravastatin  40 mg Oral QHS  . sodium chloride flush  3 mL Intravenous Q12H  . vitamin C  500 mg Oral BID   Continuous Infusions:     LOS: 4 days    Time spent: 6 minutes    Kristeena Meineke, MD [facility] Pager [phone]  If 7PM-7AM, please contact night-coverage [email or web address] Password [facility] 09/11/2016, 3:14 PM

## 2016-09-11 NOTE — NC FL2 (Signed)
[place] LEVEL OF CARE SCREENING TOOL     IDENTIFICATION  Patient Name: [name] Birthdate: 16-Nov-1928 Sex: male Admission Date (Current Location): 09/07/2016  [facility] and [place] Number:  Herbalist and Address:  [facility], [place] [address] [place], [place] [address]      Provider Number: [id number]  Attending Physician Name and Address:  Eugenie Filler, MD  Relative Name and Phone Number:  Neysa Bonito; [phone] (h)  [phone] (mobile)    Current Level of Care: Hospital Recommended Level of Care: [place] Prior Approval Number:    Date Approved/Denied:   PASRR Number: [id number] (Eff. 09/11/16)  Discharge Plan: SNF    Current Diagnoses: Patient Active Problem List   Diagnosis Date Noted  . Osteomyelitis of right lower extremity ([place]): Right great toe 09/11/2016  . Cellulitis of right foot 09/07/2016  . Acute osteomyelitis of right foot ([place]) 09/07/2016  . Foot ulcer ([place]) 09/07/2016  . Non-pressure chronic ulcer of right ankle with fat layer exposed ([place]) 08/14/2016  . Atherosclerosis of aorta ([place]) 05/30/2016  . 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 Non compliance with medical treatment 08/20/2015  . Medicare annual wellness visit, subsequent 08/18/2015  . Gout 08/18/2015  . CAD (coronary artery disease), native coronary artery   . Obesity (BMI 30-39.9)   . Spinal stenosis   . Medication management 01/23/2014  . Hyperlipidemia 11/24/2013  . DJD   . Cancer of kidney ([place])   . GERD (gastroesophageal reflux disease)   . Hypertension   . CKD, stage IV (GFR 28 ml/min) (HCC)   . Hypothyroidism   . Vitamin D deficiency   . Abnormal blood sugar     Orientation RESPIRATION BLADDER Height & Weight     Self, Time, Situation, Place  Normal Continent Weight: 257 lb 15 oz (117 kg) Height:  '5\' 9"'$  (175.3 cm)  BEHAVIORAL SYMPTOMS/MOOD  NEUROLOGICAL BOWEL NUTRITION STATUS      Incontinent Diet (Heart healthy)  AMBULATORY STATUS COMMUNICATION OF NEEDS Skin   Total Care (Per PT, ambulation not appropriate at that time as of 10/31 PT session) Verbally Other (Comment) (Incision right ankle)                       Personal Care Assistance Level of Assistance  Bathing, Feeding, Dressing Bathing Assistance: Limited assistance Feeding assistance: Independent Dressing Assistance: Limited assistance     Functional Limitations Info  Sight, Hearing, Speech Sight Info: Adequate Hearing Info: Impaired Speech Info: Adequate    SPECIAL CARE FACTORS FREQUENCY  PT (By licensed PT)     PT Frequency: Evaluated 10/31 and a minimum of 3X per week therapy recommended              Contractures Contractures Info: Not present    Additional Factors Info  Code Status, Allergies Code Status Info: Full Allergies Info: No known allergies           Current Medications (09/11/2016):  This is the current hospital active medication list Current Facility-Administered Medications  Medication Dose Route Frequency Provider Last Rate Last Dose  . acetaminophen (TYLENOL) tablet 650 mg  650 mg Oral Q6H PRN Kerney Elbe, DO       Or  . acetaminophen (TYLENOL) suppository 650 mg  650 mg Rectal Q6H PRN Bertram Savin Sheikh, DO      . amLODipine (NORVASC) tablet 10 mg  10 mg Oral  Daily Eugenie Filler, MD   10 mg at 09/11/16 6659  . amoxicillin-clavulanate (AUGMENTIN) 875-125 MG per tablet 1 tablet  1 tablet Oral Q12H Eugenie Filler, MD      . aspirin EC tablet 81 mg  81 mg Oral Daily [facility], DO   81 mg at 09/11/16 0939  . carvedilol (COREG) tablet 3.125 mg  3.125 mg Oral BID WC Eugenie Filler, MD   3.125 mg at 09/11/16 1054  . cholecalciferol (VITAMIN D) tablet 2,000 Units  2,000 Units Oral Daily [place], DO   2,000 Units at 09/11/16 9357  . ferrous sulfate tablet 325 mg  325 mg Oral BID WC Omair Latif  Sheikh, DO   325 mg at 09/11/16 1054  . guaiFENesin (MUCINEX) 12 hr tablet 600 mg  600 mg Oral BID Eugenie Filler, MD   600 mg at 09/11/16 1439  . heparin injection 5,000 Units  5,000 Units Subcutaneous [place], DO   5,000 Units at 09/11/16 1440  . levothyroxine (SYNTHROID, LEVOTHROID) tablet 150 mcg  150 mcg Oral Once per day on Mon Tue Wed Thu Fri Sat Kerney Elbe, DO   150 mcg at 09/11/16 0177  . [START ON 09/14/2016] levothyroxine (SYNTHROID, LEVOTHROID) tablet 225 mcg  225 mcg Oral Once per day on Sun Omair Latif Sheikh, DO      . multivitamin with minerals tablet 1 tablet  1 tablet Oral Daily Kerney Elbe, DO   1 tablet at 09/11/16 9390  . nitroGLYCERIN (NITROSTAT) SL tablet 0.4 mg  0.4 mg Sublingual Q5 min PRN [place], DO      . pravastatin (PRAVACHOL) tablet 40 mg  40 mg Oral QHS Bertram Savin Sheikh, DO   40 mg at 09/10/16 2209  . senna-docusate (Senokot-S) tablet 1 tablet  1 tablet Oral QHS PRN Bertram Savin Sheikh, DO      . sodium chloride flush (NS) 0.9 % injection 3 mL  3 mL Intravenous Q12H Omair Latif Sheikh, DO   3 mL at 09/11/16 0941  . traMADol (ULTRAM) tablet 50 mg  50 mg Oral Q6H PRN Bertram Savin Sheikh, DO   50 mg at 09/09/16 0247  . vitamin C (ASCORBIC ACID) tablet 500 mg  500 mg Oral BID Bertram Savin Sheikh, DO   500 mg at 09/11/16 0940     Discharge Medications: Please see discharge summary for a list of discharge medications.  Relevant Imaging Results:  Relevant Lab Results:   Additional Information ss#[id number].  Sable Feil, LCSW

## 2016-09-11 NOTE — Discharge Summary (Signed)
Physician Discharge Summary  [name] ZOX:[id number] DOB: 1929-03-04 DOA: 09/07/2016  PCP: Alesia Richards, MD  Admit date: 09/07/2016 Discharge date: 09/12/2016  Time spent: 65 minutes  Recommendations for Outpatient Follow-up:  1. Patient be discharged to a skilled nursing facility. Patient is to follow-up with Dr. Amalia Hailey, podiatry 1-2 weeks postdischarge. 2. Patient is to follow-up with M.D. at the skilled nursing facility. Patient's blood pressure will need to be reassessed. Patient will need a basic metabolic profile done in 1 week to follow-up on electrolytes and renal function.   Discharge Diagnoses:  Principal Problem:   Acute osteomyelitis of right foot ([place]) Active Problems:   Cancer of kidney (HCC)   GERD (gastroesophageal reflux disease)   Hypertension   CKD, stage IV (GFR 28 ml/min) (HCC)   Hypothyroidism   Hyperlipidemia   CAD (coronary artery disease), native coronary artery   Obesity (BMI 30-39.9)   Gout   Cellulitis of right foot   Foot ulcer ([place])   Osteomyelitis of right lower extremity [facility]): Right great toe   Chronic pain syndrome   Essential hypertension   Discharge Condition: Stable and improved  Diet recommendation: Heart healthy  Filed Weights   09/08/16 2243 09/10/16 2151 09/11/16 2102  Weight: 110.2 kg (242 lb 15.2 oz) 117 kg (257 lb 15 oz) 114.1 kg (251 lb 9.6 oz)    History of present illness:  Per Dr Nathaniel Man is a 82 y.o. male with medical history significant of HTN, HLD, Gout, Renal Cell Carcinoma s/p L Nephrectomy with Mets to Lung s/p Radiation Tx, IDA and Anemia of Chronic Disease, CKD Stage 3-4, Difficulty Hearing, GERD, CAD, and other comorbids who presented to the ER with a non-healing medial Right 1st MP joint and Lateral Ankle Ulcer. Patient had been seeing the wound clinic and stated it had gotten worse in the last week with increased pain, swelling, and tenderness to the touch. He stated the ulcer started 3  months ago, and had progressively gotten worse in the last week. He had been on outpatient Abx with Doxycycline and Ciprofloxacin and has not had improvement. Patient admitted to increased redness and swelling in foot and has had ABI testing as an out patient. No other complaints or concerns except pain.  ED Course: Patient had a Foot X-Ray, basic Labwork and placed on Renally dosed Vancomycin.  Hospital Course:  #1 acute osteomyelitis right great toe/right foot cellulitis Patient status post arthroplasty of first MPJ right foot, sesamoidectomy right foot, incision and drainage with placement of antibiotic beads of the right foot with debridement of the right ankle per Dr. Amalia Hailey of podiatry. Patient was initially placed on IV vancomycin. Surgical cultures consistent with MSSA. Blood cultures with no growth to date. Surgical pathology consistent with osteomyelitis and negative for malignancy. IV vancomycin was subsequently transitioned to oral Augmentin and patient will be discharged on one week of oral Augmentin to complete a course of antibiotic treatment. Patient will follow-up with Dr. Amalia Hailey of podiatry 1-2 weeks postdischarge.   #2 hypertension Losartan held secondary to acute kidney injury. Patient status post history of nephrectomy. Increased Norvasc to 10 mg daily with better blood pressure control. Bumex will be resumed on discharge. Outpatient follow-up.. Follow.  #3 hypothyroidism Continued on home regimen of Synthroid.  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 Alen Blew.  #5 anemia of chronic disease H&H remained stable.  #6 chronic kidney disease stage IV Currently stable. Monitor.  #7 cough Patient  with a productive cough of yellowish sputum however patient was not hypoxic. Patient remained afebrile. Patient does not have a leukocytosis and patient does not look septic. Patient was on IV vancomycin throughout the hospitalization.  Patient has been transitioned to oral Augmentin for 1 more week. Patient was also placed on Mucinex. Outpatient follow-up.     Procedures:  MRI right foot 09/07/2016  Plain FILMS The right foot 09/07/2016, 09/10/2016  Arthroplasty of first MPJ right foot, sesamoidectomy right foot, incision and drainage-complicated right foot, placement of antibiotic beads right foot, debridement right ankle----per Dr. Amalia Hailey 09/09/2016   Consultations:  Podiatry: Dr. Amalia Hailey   Discharge Exam: Vitals:   09/12/16 0456 09/12/16 0741  BP: (!) 158/80 (!) 169/80  Pulse: 86 79  Resp: 18 18  Temp: 97.6 F (36.4 C) 98.4 F (36.9 C)    General: NAD Cardiovascular: RRR Respiratory: CTAB  Discharge Instructions   Discharge Instructions    Diet - low sodium heart healthy    Complete by:  As directed    Increase activity slowly    Complete by:  As directed      Current Discharge Medication List    START taking these medications   Details  amoxicillin-clavulanate (AUGMENTIN) 875-125 MG tablet Take 1 tablet by mouth every 12 (twelve) hours. Qty: 14 tablet, Refills: 0    carvedilol (COREG) 3.125 MG tablet Take 1 tablet (3.125 mg total) by mouth 2 (two) times daily with a meal. Qty: 31 tablet, Refills: 0    guaiFENesin (MUCINEX) 600 MG 12 hr tablet Take 1 tablet (600 mg total) by mouth 2 (two) times daily. Qty: 14 tablet, Refills: 0    heparin 5000 UNIT/ML injection Inject 1 mL (5,000 Units total) into the skin every 8 (eight) hours. Qty: 1 mL    menthol-cetylpyridinium (CEPACOL) 3 MG lozenge Take 1 lozenge (3 mg total) by mouth as needed for sore throat. Qty: 100 tablet, Refills: 12      CONTINUE these medications which have CHANGED   Details  amLODipine (NORVASC) 10 MG tablet Take 1 tablet (10 mg total) by mouth daily before breakfast. Qty: 30 tablet, Refills: 0   Associated Diagnoses: Essential hypertension; Coronary artery disease involving native coronary artery of native heart  without angina pectoris    traMADol (ULTRAM) 50 MG tablet Take 1 tablet (50 mg total) by mouth 3 (three) times daily as needed for severe pain. Qty: 20 tablet, Refills: 0   Associated Diagnoses: Chronic pain syndrome      CONTINUE these medications which have NOT CHANGED   Details  allopurinol (ZYLOPRIM) 300 MG tablet TAKE 1 TABLET DAILY Qty: 90 tablet, Refills: 1    aspirin EC 81 MG tablet Take 81 mg by mouth daily.    bumetanide (BUMEX) 2 MG tablet TAKE ONE-HALF (1/2) TO ONE TABLET DAILY FOR FLUID Qty: 90 tablet, Refills: 2    Cholecalciferol (VITAMIN D3) 1000 UNITS CAPS Take 2,000 capsules by mouth daily.     diclofenac sodium (VOLTAREN) 1 % GEL Apply 4 g topically 4 (four) times daily. Qty: 100 g, Refills: 3    FERROUS SULFATE PO Take 3 tablets by mouth daily.     levothyroxine (SYNTHROID, LEVOTHROID) 150 MCG tablet Take 1 pill daily but take 1.5 pills on Sunday, take 1 hour before food, only with water. Qty: 100 tablet, Refills: 1    Magnesium 250 MG TABS Take 500 mg by mouth daily.     Multiple Vitamin (MULITIVITAMIN WITH MINERALS) TABS Take 1 tablet  by mouth daily.    pravastatin (PRAVACHOL) 40 MG tablet Take 1 tablet (40 mg total) by mouth at bedtime. Qty: 90 tablet, Refills: 4    vitamin C (ASCORBIC ACID) 500 MG tablet Take 1,000 mg by mouth daily.     nitroGLYCERIN (NITROSTAT) 0.4 MG SL tablet Place 0.4 mg under the tongue every 5 (five) minutes as needed for chest pain.       STOP taking these medications     ciprofloxacin (CIPRO) 500 MG tablet      losartan (COZAAR) 50 MG tablet        No Known Allergies Follow-up Information    Edrick Kins, DPM. Schedule an appointment as soon as possible for a visit in 1 week(s).   Specialty:  Podiatry Why:  f/u in 1-2 weeks. Contact information: [address] [place] [address] [phone]        MD at SNF .            The results of significant diagnostics from this hospitalization (including  imaging, microbiology, ancillary and laboratory) are listed below for reference.    Significant Diagnostic Studies: Mr Foot Right Wo Contrast  Result Date: 09/08/2016 CLINICAL DATA:  Chronic kidney disease. Nonhealing medial right first MP joint ulcer. Right lateral ankle ulcer. EXAM: MRI OF THE RIGHT FOREFOOT WITHOUT CONTRAST TECHNIQUE: Multiplanar, multisequence MR imaging was performed. No intravenous contrast was administered. COMPARISON:  None. FINDINGS: Bones/Joint/Cartilage Soft tissue ulcer along the medial aspect of the first metatarsal head. Mild underlying cortical irregularity of the medial margin of the first metatarsal head with underlying marrow edema concerning for osteomyelitis. No other areas of bone destruction or periosteal reaction. Mild marrow edema in the fifth metatarsal head likely reactive. Hallux valgus of the right foot with moderate osteoarthritis of the first MTP joint. Mild osteoarthritis of the first IP joint. Mild osteoarthritis of the second and third MTP joints. No fracture or dislocation. No joint effusion. Ligaments Normal collateral ligaments.  Normal Lisfranc ligament. Muscles and Tendons Flexor, peroneal and extensor compartment tendons are intact. Increased signal in the plantar musculature likely neurogenic. Soft tissue No fluid collection or hematoma. No soft tissue mass. Severe soft tissue edema along the dorsal aspect of the midfoot and forefoot which may reflect severe cellulitis versus reactive edema. IMPRESSION: 1. Soft tissue ulcer along the medial aspect of the first metatarsal head. Mild underlying cortical irregularity of the medial margin of the first metatarsal head with underlying marrow edema concerning for osteomyelitis. 2. Severe soft tissue edema along the dorsal aspect of the midfoot and forefoot which may reflect severe cellulitis versus reactive edema. 3. No drainable fluid collection to suggest an abscess. 4. Hallux valgus of the right foot with  moderate osteoarthritis of the first MTP joint. Electronically Signed   By: Kathreen Devoid   On: 09/08/2016 08:24   Dg Foot Complete Right  Result Date: 09/10/2016 CLINICAL DATA:  Postoperative radiograph of the right foot. Initial encounter. EXAM: RIGHT FOOT COMPLETE - 3+ VIEW COMPARISON:  Right foot MRI performed 09/07/2016 FINDINGS: The patient is status post resection of the distal first metatarsal and the base of the first proximal phalanx, with associated high-density beads. Visualized joint spaces are grossly unremarkable. Mild overlying soft tissue swelling is noted. Scattered vascular calcifications are seen. The subtalar joint is grossly unremarkable. Mild degenerative change is noted at the midfoot. IMPRESSION: 1. Status post resection of distal first metatarsal and base of the first proximal phalanx, with associated high-density beads.  2. Scattered vascular calcifications seen. Electronically Signed   By: Garald Balding M.D.   On: 09/10/2016 00:46   Dg Foot Complete Right  Result Date: 09/07/2016 CLINICAL DATA:  Right foot pain, infection EXAM: RIGHT FOOT COMPLETE - 3+ VIEW COMPARISON:  08/07/2016 FINDINGS: No fracture or dislocation is seen. Degenerative changes at the 1st MTP joint with associated hallux valgus deformity. Soft tissue swelling/wound along the medial aspect of the 1st metatarsal head. No definite underlying cortical destruction. Additional mild degenerative changes at the 2nd and 3rd MTP joints. IMPRESSION: Soft tissue swelling/wound along the medial aspect of the 1st metatarsal head. No definite underlying cortical destruction to suggest acute osteomyelitis. Additional degenerative changes, as above. Electronically Signed   By: Julian Hy M.D.   On: 09/07/2016 17:53    Microbiology: Recent Results (from the past 240 hour(s))  Culture, blood (single)     Status: None   Collection Time: 09/07/16  3:53 PM  Result Value Ref Range Status   Specimen Description BLOOD  LEFT ANTECUBITAL  Final   Special Requests BOTTLES DRAWN AEROBIC AND ANAEROBIC 5 CC  Final   Culture NO GROWTH 5 DAYS  Final   Report Status 09/12/2016 FINAL  Final  Aerobic/Anaerobic Culture (surgical/deep wound)     Status: None (Preliminary result)   Collection Time: 09/08/16  9:17 PM  Result Value Ref Range Status   Specimen Description WOUND RIGHT FOOT  Final   Special Requests PATIENT ON FOLLOWING VANC  Final   Gram Stain   Final    FEW WBC PRESENT,BOTH PMN AND MONONUCLEAR FEW GRAM POSITIVE COCCI IN PAIRS    Culture   Final    RARE STAPHYLOCOCCUS AUREUS NO ANAEROBES ISOLATED; CULTURE IN PROGRESS FOR 5 DAYS    Report Status PENDING  Incomplete   Organism ID, Bacteria STAPHYLOCOCCUS AUREUS  Final      Susceptibility   Staphylococcus aureus - MIC*    CIPROFLOXACIN <=0.5 SENSITIVE Sensitive     ERYTHROMYCIN <=0.25 SENSITIVE Sensitive     GENTAMICIN <=0.5 SENSITIVE Sensitive     OXACILLIN <=0.25 SENSITIVE Sensitive     TETRACYCLINE >=16 RESISTANT Resistant     VANCOMYCIN <=0.5 SENSITIVE Sensitive     TRIMETH/SULFA <=10 SENSITIVE Sensitive     CLINDAMYCIN <=0.25 SENSITIVE Sensitive     RIFAMPIN <=0.5 SENSITIVE Sensitive     Inducible Clindamycin NEGATIVE Sensitive     * RARE STAPHYLOCOCCUS AUREUS     Labs: Basic Metabolic Panel:  Recent Labs Lab 09/08/16 0615 09/09/16 0506 09/10/16 0532 09/11/16 0525 09/12/16 0307  NA 136 138 138 138 137  K 4.3 4.4 4.6 4.6 4.5  CL 108 110 111 109 109  CO2 20* 21* 20* 21* 21*  GLUCOSE 127* 114* 120* 105* 111*  BUN 46* 38* 29* 30* 32*  CREATININE 2.64* 2.14* 1.90* 1.85* 1.83*  CALCIUM 8.5* 8.6* 8.7* 9.0 9.0   Liver Function Tests:  Recent Labs Lab 09/07/16 1608 09/08/16 0615  AST 22 20  ALT 17 15*  ALKPHOS 77 69  BILITOT 0.5 0.5  PROT 6.9 6.5  ALBUMIN 2.7* 2.5*   No results for input(s): LIPASE, AMYLASE in the last 168 hours. No results for input(s): AMMONIA in the last 168 hours. CBC:  Recent Labs Lab  09/07/16 1608 09/08/16 0615 09/09/16 0506 09/11/16 0525  WBC 9.7 9.3 8.3 9.4  NEUTROABS 7.3  --  5.5  --   HGB 9.5* 9.2* 9.0* 9.3*  HCT 29.4* 28.9* 28.1* 29.3*  MCV 96.4 96.0 96.2  95.1  PLT 241 256 246 277   Cardiac Enzymes: No results for input(s): CKTOTAL, CKMB, CKMBINDEX, TROPONINI in the last 168 hours. BNP: BNP (last 3 results) No results for input(s): BNP in the last 8760 hours.  ProBNP (last 3 results) No results for input(s): PROBNP in the last 8760 hours.  CBG: No results for input(s): GLUCAP in the last 168 hours.     SignedIrine Seal MD.  [facility] 09/12/2016, 3:50 PM

## 2016-09-11 NOTE — Progress Notes (Addendum)
  [name]          PPH:[id number]  DOB: [birth year]  DOA: 09/07/2016 PCP: Unk Pinto DAVID, Md      Subjective:  Patient presents status post osteomyelitis resection with cellulitis to the right foot. Date of surgery 09/08/2016. Patient states that he has minimal pain that is intermittent. Patient otherwise is feeling healthy.    Objective/Physical Exam General: The patient is alert and oriented x3 in no acute distress. Orthopedic Exam: Today dressings were removed and changed. There is extensive drainage noted within the incision site, which was anticipated due to placement of antibiotic beads, lower extremity edema, and cellulitis. Sutures and skin staples are intact. Peri-incisional area is mildly erythematous and macerated.   Assessment: #1 status post osteomyelitis resection right foot. DOS: 09/08/2016 #2 mild cellulitis-improving  #3 minimal intermittent pain    Plan of Care:  #1 Patient was evaluated at bedside today.dressings were changed.   #2 From a surgical standpoint, the patient is okay to be discharged. Intraoperative evaluation appeared to be healthy viable wound margins. At the moment, long term IV antibiotics do not seem to be warranted. Surgical incision site is stable. #3 recommend discharge to a rehabilitation facility #4 arranged for daily dressing changes to the right foot, consisting of daily application of iodine, 4 x 4 gauze, Kerlix, and light Ace wrap. #5 upon discharge patient will follow up in office.    Dr. Edrick Kins, [place]

## 2016-09-12 ENCOUNTER — Ambulatory Visit: Payer: Medicare Other | Admitting: Surgery

## 2016-09-12 DIAGNOSIS — G894 Chronic pain syndrome: Secondary | ICD-10-CM

## 2016-09-12 DIAGNOSIS — E669 Obesity, unspecified: Secondary | ICD-10-CM

## 2016-09-12 DIAGNOSIS — I10 Essential (primary) hypertension: Secondary | ICD-10-CM

## 2016-09-12 DIAGNOSIS — I2510 Atherosclerotic heart disease of native coronary artery without angina pectoris: Secondary | ICD-10-CM

## 2016-09-12 LAB — BASIC METABOLIC PANEL WITH GFR
Anion gap: 7 (ref 5–15)
BUN: 32 mg/dL — ABNORMAL HIGH (ref 6–20)
CO2: 21 mmol/L — ABNORMAL LOW (ref 22–32)
Calcium: 9 mg/dL (ref 8.9–10.3)
Chloride: 109 mmol/L (ref 101–111)
Creatinine, Ser: 1.83 mg/dL — ABNORMAL HIGH (ref 0.61–1.24)
GFR calc Af Amer: 37 mL/min — ABNORMAL LOW
GFR calc non Af Amer: 32 mL/min — ABNORMAL LOW
Glucose, Bld: 111 mg/dL — ABNORMAL HIGH (ref 65–99)
Potassium: 4.5 mmol/L (ref 3.5–5.1)
Sodium: 137 mmol/L (ref 135–145)

## 2016-09-12 LAB — CULTURE, BLOOD (SINGLE): CULTURE: NO GROWTH

## 2016-09-12 LAB — BASIC METABOLIC PANEL
BUN: 32 mg/dL — AB (ref 4–21)
CREATININE: 1.8 mg/dL — AB (ref 0.6–1.3)
GLUCOSE: 111 mg/dL
Sodium: 137 mmol/L (ref 137–147)

## 2016-09-12 MED ORDER — MENTHOL 3 MG MT LOZG: 1.0000 | LOZENGE | OROMUCOSAL | Status: DC | PRN

## 2016-09-12 MED ORDER — AMLODIPINE BESYLATE 10 MG PO TABS: 10.0000 mg | ORAL_TABLET | Freq: Every day | ORAL | 0 refills | Status: DC

## 2016-09-12 MED ORDER — HEPARIN SODIUM (PORCINE) 5000 UNIT/ML IJ SOLN: 5000.0000 [IU] | Freq: Three times a day (TID) | INTRAMUSCULAR | Status: DC

## 2016-09-12 MED ORDER — GUAIFENESIN ER 600 MG PO TB12: 600.0000 mg | ORAL_TABLET | Freq: Two times a day (BID) | ORAL | 0 refills | Status: AC

## 2016-09-12 MED ORDER — MENTHOL 3 MG MT LOZG: 1.0000 | LOZENGE | OROMUCOSAL | 12 refills | Status: DC | PRN

## 2016-09-12 MED ORDER — TRAMADOL HCL 50 MG PO TABS: 50.0000 mg | ORAL_TABLET | Freq: Three times a day (TID) | ORAL | 0 refills | Status: DC | PRN

## 2016-09-12 MED ORDER — CARVEDILOL 3.125 MG PO TABS: 3.1250 mg | ORAL_TABLET | Freq: Two times a day (BID) | ORAL | 0 refills | Status: DC

## 2016-09-12 MED ORDER — AMOXICILLIN-POT CLAVULANATE 875-125 MG PO TABS: 1.0000 | ORAL_TABLET | Freq: Two times a day (BID) | ORAL | 0 refills | Status: AC

## 2016-09-12 NOTE — Progress Notes (Signed)
Report given to [facility] at [facility].

## 2016-09-12 NOTE — Clinical Social Work Note (Signed)
Clinical Social Work Assessment  Patient Details  Name: [name] MRN: [id number] Date of Birth: 14-Nov-1928  Date of referral:  09/11/16               Reason for consult:  Facility Placement                Permission sought to share information with:  Family Supports Permission granted to share information::  Yes, Verbal Permission Granted  Name::     Lawrence Santiago  Agency::     Relationship::  Daughter  Contact Information:  [phone]  Housing/Transportation Living arrangements for the past 2 months:  [place] of Information:  Patient, Adult Children (Daughter Juliann Pulse) Patient Interpreter Needed:  None Criminal Activity/Legal Involvement Pertinent to Current Situation/Hospitalization:  No - Comment as needed Significant Relationships:  Adult Children Lives with:  Self Do you feel safe going back to the place where you live?  Yes (Patient feels safe at his home, but is agreeable to [place] rehab for strenghtening before returning home) Need for family participation in patient care:  Yes (Comment)  Care giving concerns:  Patient reported that he lives alone. He expressed agreement, as did his daughter with ST rehab.   Social Worker assessment / plan:  CSW talked with patient at the bedside and daughter by phone regarding discharge planning. Patient was sitting up in a chair at the bedside and was napping but awakened when his name was called. CSW talked with patient about recommendation of ST rehab and readiness for discharge today and patient in agreement. When asked, patient reported that he has not been to rehab before in a skilled facility and the process was explained. Patient gave permission for his daughter to be contacted and this was done at the bedside and a message left. Talked with daughter later and she is also in agreement with ST rehab and facility responses provided to her (SNF list with facility responses left with patient at bedside). Daughters  preferences are [facility] and [employer].   Employment status:  Retired Science writer) PT Recommendations:  [place] / Referral to community resources:  Other (Comment Required) (SNF list not provided as patient provided her facilities preferences to CSW )  Patient/Family's Response to care:  No concerns expressed regarding care during hospitalization.  Patient/Family's Understanding of and Emotional Response to Diagnosis, Current Treatment, and Prognosis:  Not discussed.  Emotional Assessment Appearance:  Appears stated age Attitude/Demeanor/Rapport:  Other (Appropriate) Affect (typically observed):  Appropriate Orientation:  Oriented to Self, Oriented to Place, Oriented to  Time, Oriented to Situation Alcohol / Substance use:  Tobacco Use, Alcohol Use, Illicit Drugs (Patient reported that she quit smoking 45 years ago, drinks 6.6 oz of alcohol per week and does not use illicit drugs) Psych involvement (Current and /or in the community):  No (Comment)  Discharge Needs  Concerns to be addressed:  Discharge Planning Concerns Readmission within the last 30 days:  No Current discharge risk:  None Barriers to Discharge:  No Barriers Identified   Sable Feil, LCSW 09/12/2016, 5:41 PM

## 2016-09-12 NOTE — Final Progress Note (Signed)
IV access was d/c'd. Vitals are stable. Skin is intact except as charted in most recent assessments. Patient transported by non emergent transport.

## 2016-09-12 NOTE — Care Management Note (Signed)
Case Management Note  Patient Details  Name: [name] MRN: [id number] Date of Birth: 07-31-1929  Subjective/Objective:                 Admitted for osteo of R foot   Action/Plan:  DC to SNF as facilitated by CSW.  Expected Discharge Date:  09/15/16               Expected Discharge Plan:  Skilled Nursing Facility  In-House Referral:  Clinical Social Work  Discharge planning Services  CM Consult  Post Acute Care Choice:  NA Choice offered to:  NA  DME Arranged:    DME Agency:     HH Arranged:    [place] Agency:     Status of Service:  Completed, signed off  If discussed at [employer], dates discussed:    Additional Comments:  Carles Collet, RN 09/12/2016, 1:29 PM

## 2016-09-12 NOTE — Progress Notes (Signed)
[facility], to request non-emergency transport to SNF [facility]).  Jillyn Ledger, MBA, BSN, RN

## 2016-09-12 NOTE — Clinical Social Work Placement (Signed)
   CLINICAL SOCIAL WORK PLACEMENT  NOTE 09/12/16 - DISCHARGED TO [facility]  Date:  09/12/2016  Patient Details  Name: [name] MRN: [id number] Date of Birth: 1929/04/03  Clinical Social Work is seeking post-discharge placement for this patient at the [place] level of care (*CSW will initial, date and re-position this form in  chart as items are completed):  Yes   Patient/family provided with [place] Work Department's list of facilities offering this level of care within the geographic area requested by the patient (or if unable, by the patient's family).  Yes   Patient/family informed of their freedom to choose among providers that offer the needed level of care, that participate in Medicare, Medicaid or managed care program needed by the patient, have an available bed and are willing to accept the patient.  Yes   Patient/family informed of [facility]'s ownership interest in [facility] and [facility], as well as of the fact that they are under no obligation to receive care at these facilities.  PASRR submitted to EDS on 09/12/16     PASRR number received on 09/12/16     Existing PASRR number confirmed on       FL2 transmitted to all facilities in geographic area requested by pt/family on 09/12/16     FL2 transmitted to all facilities within larger geographic area on       Patient informed that his/her managed care company has contracts with or will negotiate with certain facilities, including the following:        Yes   Patient/family informed of bed offers received.  Patient chooses bed at [facility]     Physician recommends and patient chooses bed at      Patient to be transferred to [facility] on 09/12/16.  Patient to be transferred to facility by ambulance     Patient family notified on 09/12/16 of transfer.  Name of family member notified:  Daughter, [name]  [phone])     PHYSICIAN       Additional Comment:    _______________________________________________ Sable Feil, LCSW 09/12/2016, 6:44 PM

## 2016-09-12 NOTE — Progress Notes (Signed)
Physical Therapy Treatment Patient Details Name: [name] MRN: [id number] DOB: 08/27/1929 Today's Date: 09/12/2016    History of Present Illness [name] is a 82 y.o. male with medical history significant of HTN, HLD, Gout, Renal Cell Carcinoma s/p L Nephrectomy with Mets to Lung s/p Radiation Tx, IDA and Anemia of Chronic Disease, CKD Stage 3-4, Difficulty Hearing, GERD, CAD, and other comorbids who presents to the ER with a non-healing medial Right 1st MP joint and Lateral Ankle Ulcer.  S/P I and D or R great toe MP jt and addition of abx beads, plus I and D of ankle wound.     PT Comments    Pt making slow progress due to weakness, arthritic/stiffness pain in knees and minimal w/bearing on the R LE.  Emphasis on standing balance, off loading the R foot and keeping weight back on the heel as much as possible.   Follow Up Recommendations  SNF     Equipment Recommendations  None recommended by PT    Recommendations for Other Services       Precautions / Restrictions Precautions Precautions: Fall Required Braces or Orthoses: Other Brace/Splint (orto shoe)    Mobility  Bed Mobility Overal bed mobility: Needs Assistance Bed Mobility: Supine to Sit           General bed mobility comments: up in the chair on arrival  Transfers Overall transfer level: Needs assistance Equipment used: Rolling walker (2 wheeled) Transfers: Sit to/from Stand Sit to Stand: Mod assist;+2 physical assistance         General transfer comment: Cues for hand placement,  Assist to come forward and up.  Ambulation/Gait Ambulation/Gait assistance: Min assist;+2 safety/equipment Ambulation Distance (Feet): 22 Feet (cuing pt to stay back on his heel) Assistive device: Rolling walker (2 wheeled) Gait Pattern/deviations: Step-to pattern   Gait velocity interpretation: Below normal speed for age/gender General Gait Details: pt likely putting pwb through foot with more going into toe  than MD wants.  Will call to verify.   Stairs            Wheelchair Mobility    Modified Rankin (Stroke Patients Only)       Balance             Standing balance-Leahy Scale: Poor                      Cognition Arousal/Alertness: Awake/alert Behavior During Therapy: WFL for tasks assessed/performed Overall Cognitive Status: Within Functional Limits for tasks assessed                      Exercises Other Exercises Other Exercises: Warm up knee/hip ROM exercise with graded resistance.    General Comments        Pertinent Vitals/Pain Faces Pain Scale: Hurts little more Pain Location: R toe Pain Descriptors / Indicators: Discomfort Pain Intervention(s): Monitored during session;Repositioned    Home Living                      Prior Function            PT Goals (current goals can now be found in the care plan section) Acute Rehab PT Goals Patient Stated Goal: be able to go back home independently PT Goal Formulation: With patient Time For Goal Achievement: 09/23/16 Potential to Achieve Goals: Fair Progress towards PT goals: Progressing toward goals    Frequency    Min 3X/week  PT Plan Current plan remains appropriate    Co-evaluation             End of Session   Activity Tolerance: Patient tolerated treatment well Patient left: in chair;with call bell/phone within reach;Other (comment)     Time: 8184-0375 PT Time Calculation (min) (ACUTE ONLY): 25 min  Charges:  $Gait Training: 8-22 mins $Therapeutic Activity: 8-22 mins                    G Codes:      [name], Tessie Fass 09/12/2016, 1:33 PM  09/12/2016  Donnella Sham, PT [phone]  (pager)

## 2016-09-13 LAB — AEROBIC/ANAEROBIC CULTURE (SURGICAL/DEEP WOUND)

## 2016-09-13 LAB — AEROBIC/ANAEROBIC CULTURE W GRAM STAIN (SURGICAL/DEEP WOUND)

## 2016-09-14 NOTE — Progress Notes (Signed)
Patient ID: [name], male   DOB: 1929/04/25, 82 y.o.   MRN: [id number] Subjective:  Patient presents today for evaluation of right foot ulceration. Patient states that he was sent from the wound care center for referral from Dr. Con Memos. Patient states that he has had wounds on his right foot and ankle for greater than 2 weeks. Patient has been seen and managed at the wound care center for approximately 3 weeks now. Patient states that the wound has become much worse the last few weeks since I saw him last.  Objective/Physical Exam General: The patient is alert and oriented x3 in no acute distress.  Dermatology: Wound #1 is located to the right medial first MPJ measuring approximately 2.5 cm 2.0 cm 0.2 cm (LxWxD). Wound #2 is located to the right lateral ankle overlying the fibular malleolus measuring 2.0 cm 2.0 cm 0.2 cm (LxWxD).   To the above-noted ulcerations there is no eschar there is no extensive amount of slough fibrin and necrotic tissue. Granulation tissue is red. There is exposed bone noted to the first MPJ ulceration. There is a moderate amount of serosanguineous drainage noted. No malodor. Periwound integrity is very macerated with cellulitis extending to the dorsum of the foot.  Vascular: Pedal pulses diminished bilaterally. No edema or erythema noted. Capillary refill within normal limits.  Neurological: Epicritic and protective threshold absent bilaterally.   Musculoskeletal Exam: Range of motion within normal limits to all pedal and ankle joints bilateral. Muscle strength 5/5 in all groups bilateral.   Severe hallux abductovalgus deformity noted to the right first MPJ.  Assessment: #1 ulcerations to the first MPJ and lateral ankle the right lower extremity secondary to venous insufficiency. #2 hallux abductovalgus deformity with an increased IM angle greater than 20 right lower extremity #3 pain in right foot #4 pain in right ankle    Plan of Care:  #1 Patient  was evaluated. #2 medically necessary excisional debridement including muscle and deep fascial layers tissues performed using a tissue nipper. Excisional debridement of all the necrotic nonviable tissue down to healthy bleeding viable tissue was performed with post-debridement measurements and was pre-.  #3 the wound was cleansed with normal saline #4 dry sterile dressing applied #5 discussed with patient possible surgical exostectomy of the first MPJ right foot due to exposed bone and high likelihood of osteomyelitis. Recommend surgical intervention. #6 at this time due to the acute changes of the ulceration, recommend admitting to the emergency department at [facility] for surgical intervention which would include arthroplasty of the first MPJ joint resection. #7 podiatry will follow up as an inpatient in the hospital once the patient has been admitted   Dr. Edrick Kins, [place]

## 2016-09-15 ENCOUNTER — Encounter: Payer: Self-pay | Admitting: Internal Medicine

## 2016-09-15 ENCOUNTER — Non-Acute Institutional Stay (SKILLED_NURSING_FACILITY): Payer: Medicare Other | Admitting: Internal Medicine

## 2016-09-15 DIAGNOSIS — M86171 Other acute osteomyelitis, right ankle and foot: Secondary | ICD-10-CM

## 2016-09-15 DIAGNOSIS — N184 Chronic kidney disease, stage 4 (severe): Secondary | ICD-10-CM | POA: Diagnosis not present

## 2016-09-15 DIAGNOSIS — M869 Osteomyelitis, unspecified: Secondary | ICD-10-CM | POA: Diagnosis not present

## 2016-09-15 DIAGNOSIS — E782 Mixed hyperlipidemia: Secondary | ICD-10-CM

## 2016-09-15 DIAGNOSIS — D508 Other iron deficiency anemias: Secondary | ICD-10-CM | POA: Diagnosis not present

## 2016-09-15 DIAGNOSIS — C642 Malignant neoplasm of left kidney, except renal pelvis: Secondary | ICD-10-CM

## 2016-09-15 DIAGNOSIS — C7801 Secondary malignant neoplasm of right lung: Secondary | ICD-10-CM | POA: Diagnosis not present

## 2016-09-15 DIAGNOSIS — I10 Essential (primary) hypertension: Secondary | ICD-10-CM | POA: Diagnosis not present

## 2016-09-15 DIAGNOSIS — L97312 Non-pressure chronic ulcer of right ankle with fat layer exposed: Secondary | ICD-10-CM | POA: Diagnosis not present

## 2016-09-15 DIAGNOSIS — E034 Atrophy of thyroid (acquired): Secondary | ICD-10-CM

## 2016-09-15 DIAGNOSIS — D649 Anemia, unspecified: Secondary | ICD-10-CM | POA: Insufficient documentation

## 2016-09-15 NOTE — Progress Notes (Signed)
: Provider:  Noah Delaine. Sheppard Coil, MD Location:  [place] Room Number: [phone] Place of Service:  SNF ([phone])  PCP: Alesia Richards, MD Patient Care Team: Unk Pinto, MD as PCP - General (Internal Medicine) Jacolyn Reedy, MD as Consulting Physician (Cardiology) Shon Hough, MD as Consulting Physician (Ophthalmology) Teena Irani, MD as Consulting Physician (Gastroenterology) Ninetta Lights, MD as Consulting Physician (Orthopedic Surgery) Raynelle Bring, MD as Consulting Physician (Urology) Karie Chimera, MD as Consulting Physician (Neurosurgery) Marygrace Drought, MD as Consulting Physician (Ophthalmology)  Extended Emergency Contact Information Primary Emergency Contact: Lawrence Santiago Address: [address]          [place], [place] [address] Johnnette Litter of [place] Phone: [phone] Mobile Phone: [phone] Relation: Daughter     Allergies: Patient has no known allergies.  Chief Complaint  Patient presents with  . New Admit To SNF    Admit to Facility    HPI: Patient is 82 y.o. male with HTN, HLD, Gout, Renal Cell Carcinoma s/p L Nephrectomy with Mets to Lung s/p Radiation Tx, IDA and Anemia of Chronic Disease, CKD Stage 3-4, Difficulty Hearing, GERD, CAD, and other comorbids who presented to the ER with a non-healing medial Right 1st MP joint and Lateral Ankle Ulcer. Patient had been seeing the wound clinic and stated it had gotten worse in the last week with increased pain, swelling, and tenderness to the touch. He stated the ulcer started 3 months ago, and had progressively gotten worse in the last week. He had been on outpatient Abx with Doxycycline and Ciprofloxacin and has not had improvement. Pt was admitted to [facility] from 10/29-11/3 where pt was dx with acute osteomyelitis of R great toe and foot cellulitis.  Cx grew out MSSA. Pt was tx with IV vancomycin and surgical debridement with placement of antibiotic beads. Pt is admitted  to SNF for OT/PT. While at SNF pt will be followed for HTN, tx with norvasc, HLD, tx with pravachol, hypothyroidism, tx with synthroid and anemia, tx with iron.   Past Medical History:  Diagnosis Date  . Anemia   . Arthritis    stenosis - back, neck  . Cancer [facility])    renal neoplasm  . Chronic kidney disease    renalCa- nephrectomy [facility]) 04/2012  . Coronary artery disease   . GERD (gastroesophageal reflux disease)   . Hard of hearing   . Hearing deficit    hearing aids- bilateral  . Hypertension    sees Dr. Wynonia Lawman, stress test recently for prep for surgery  . Hypothyroidism   . Lung cancer ([place])    right lower lobe nodule suspicious for metastatic clear cell renal cell neoplasm  . Prediabetes   . Vitamin D deficiency     Past Surgical History:  Procedure Laterality Date  . ANTERIOR CERVICAL DECOMP/DISCECTOMY FUSION N/A 01/18/2013   Procedure: ANTERIOR CERVICAL DECOMPRESSION/DISCECTOMY FUSION 2 LEVELS;  Surgeon: Faythe Ghee, MD;  Location: [place] NEUR[facility] ORS;  Service: Neurosurgery;  Laterality: N/A;  . APPENDECTOMY    . BACK SURGERY     lower back surgery - 1990's  . CHOLECYSTECTOMY    . CORONARY ANGIOPLASTY     stents , + 7-28yr. ago  . EYE SURGERY     right eye cataract surgery   . I&D EXTREMITY Right 09/08/2016   Procedure: IRRIGATION AND DEBRIDEMENT RIGHT FOOT WITH ANTIBIOTIC BEADS;  Surgeon: BEdrick Kins DPM;  Location: [facility]  Service: Podiatry;  Laterality: Right;  .  JOINT REPLACEMENT     bilateral knee replacements   . LAPAROSCOPIC NEPHRECTOMY  04/29/2012   Procedure: LAPAROSCOPIC NEPHRECTOMY;  Surgeon: Dutch Gray, MD;  Location: [facility];  Service: Urology;  Laterality: Left;      . TOE ARTHROPLASTY Right 09/08/2016   Procedure: Arthroplasty of First MPJ RIGHT FOOT;  Surgeon: Edrick Kins, DPM;  Location: [place];  Service: Podiatry;  Laterality: Right;  . TONSILLECTOMY    . WOUND DEBRIDEMENT Right 09/08/2016   Procedure: DEBRIDEMENT RIGHT ANKLE WOUND;  Surgeon:  Edrick Kins, DPM;  Location: [facility];  Service: Podiatry;  Laterality: Right;      Medication List       Accurate as of 09/15/16 10:41 AM. Always use your most recent med list.          allopurinol 300 MG tablet Commonly known as:  ZYLOPRIM TAKE 1 TABLET DAILY   amLODipine 10 MG tablet Commonly known as:  NORVASC Take 1 tablet (10 mg total) by mouth daily before breakfast.   amoxicillin-clavulanate 875-125 MG tablet Commonly known as:  AUGMENTIN Take 1 tablet by mouth every 12 (twelve) hours.   aspirin EC 81 MG tablet Take 81 mg by mouth daily.   bumetanide 2 MG tablet Commonly known as:  BUMEX TAKE ONE-HALF (1/2) TO ONE TABLET DAILY FOR FLUID   carvedilol 3.125 MG tablet Commonly known as:  COREG Take 1 tablet (3.125 mg total) by mouth 2 (two) times daily with a meal.   diclofenac sodium 1 % Gel Commonly known as:  VOLTAREN Apply 4 g topically 4 (four) times daily.   FERROUS SULFATE PO Take 3 tablets by mouth daily.   guaiFENesin 600 MG 12 hr tablet Commonly known as:  MUCINEX Take 1 tablet (600 mg total) by mouth 2 (two) times daily.   heparin 5000 UNIT/ML injection Inject 1 mL (5,000 Units total) into the skin every 8 (eight) hours.   levothyroxine 150 MCG tablet Commonly known as:  SYNTHROID, LEVOTHROID Take 1 pill daily but take 1.5 pills on Sunday, take 1 hour before food, only with water.   Magnesium 250 MG Tabs Take 500 mg by mouth daily.   menthol-cetylpyridinium 3 MG lozenge Commonly known as:  CEPACOL Take 1 lozenge (3 mg total) by mouth as needed for sore throat.   multivitamin with minerals Tabs tablet Take 1 tablet by mouth daily.   nitroGLYCERIN 0.4 MG SL tablet Commonly known as:  NITROSTAT Place 0.4 mg under the tongue every 5 (five) minutes as needed for chest pain.   pravastatin 40 MG tablet Commonly known as:  PRAVACHOL Take 1 tablet (40 mg total) by mouth at bedtime.   traMADol 50 MG tablet Commonly known as:  ULTRAM Take 1  tablet (50 mg total) by mouth 3 (three) times daily as needed for severe pain.   vitamin C 500 MG tablet Commonly known as:  ASCORBIC ACID Take 1,000 mg by mouth daily.   Vitamin D3 1000 units Caps Take 2,000 capsules by mouth daily.       No orders of the defined types were placed in this encounter.   Immunization History  Administered Date(s) Administered  . Influenza Split 09/06/2013  . Influenza, High Dose Seasonal PF 07/20/2014, 08/20/2015, 07/24/2016  . PPD Test 09/12/2016  . Pneumococcal Conjugate-13 10/30/2014  . Pneumococcal Polysaccharide-23 02/18/2012  . Td 02/18/2012  . Zoster 03/03/2013    Social History  Substance Use Topics  . Smoking status: Former Smoker    Types:  Cigarettes    Quit date: 11/10/1970  . Smokeless tobacco: Never Used  . Alcohol use 6.6 oz/week    2 Cans of beer, 9 Shots of liquor per week     Comment: friday- beer & 3 drambuie    Family history is   Family History  Problem Relation Age of Onset  . Cancer Sister     breast  . Cancer Brother     colon      Review of Systems  DATA OBTAINED: from nurse GENERAL:  no fevers, fatigue, appetite changes SKIN: No itching, or rash EYES: No eye pain, redness, discharge EARS: No earache, tinnitus, change in hearing NOSE: No congestion, drainage or bleeding  MOUTH/THROAT: No mouth or tooth pain, No sore throat RESPIRATORY: No cough, wheezing, SOB CARDIAC: No chest pain, palpitations, lower extremity edema  GI: No abdominal pain, No N/V/D or constipation, No heartburn or reflux  GU: No dysuria, frequency or urgency, or incontinence  MUSCULOSKELETAL: No unrelieved bone/joint pain NEUROLOGIC: No headache, dizziness or focal weakness PSYCHIATRIC: No c/o anxiety or sadness   Vitals:   09/15/16 1021  BP: 138/76  Pulse: 76  Resp: 18  Temp: 97.8 F (36.6 C)    SpO2 Readings from Last 1 Encounters:  09/12/16 98%   Body mass index is 38.09 kg/m.     Physical Exam  GENERAL  APPEARANCE: Alert, mod conversant,  No acute distress.  SKIN: No diaphoresis rash HEAD: Normocephalic, atraumatic  EYES: Conjunctiva/lids clear. Pupils round, reactive. EOMs intact.  EARS: External exam WNL, canals clear. Hearing grossly normal.  NOSE: No deformity or discharge.  MOUTH/THROAT: Lips w/o lesions  RESPIRATORY: Breathing is even, unlabored. Lung sounds are clear   CARDIOVASCULAR: Heart RRR no murmurs, rubs or gallops. No peripheral edema.   GASTROINTESTINAL: Abdomen is soft, non-tender, not distended w/ normal bowel sounds. GENITOURINARY: Bladder non tender, not distended  MUSCULOSKELETAL: post -op dressing and boot NEUROLOGIC:  Cranial nerves 2-12 grossly intact. Moves all extremities  PSYCHIATRIC: Mood and affect appropriate to situation with dementia, no behavioral issues  Patient Active Problem List   Diagnosis Date Noted  . Chronic pain syndrome   . Essential hypertension   . Osteomyelitis of right lower extremity ([place]): Right great toe 09/11/2016  . Cellulitis of right foot 09/07/2016  . Acute osteomyelitis of right foot ([place]) 09/07/2016  . Foot ulcer ([place]) 09/07/2016  . Non-pressure chronic ulcer of right ankle with fat layer exposed ([place]) 08/14/2016  . Atherosclerosis of aorta ([place]) 05/30/2016  . 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 Non compliance with medical treatment 08/20/2015  . Medicare annual wellness visit, subsequent 08/18/2015  . Gout 08/18/2015  . CAD (coronary artery disease), native coronary artery   . Obesity (BMI 30-39.9)   . Spinal stenosis   . Medication management 01/23/2014  . Hyperlipidemia 11/24/2013  . DJD   . Cancer of kidney ([place])   . GERD (gastroesophageal reflux disease)   . Hypertension   . CKD, stage IV (GFR 28 ml/min) ([facility])   . Hypothyroidism   . Vitamin D deficiency   . Abnormal blood sugar       Labs reviewed: Basic Metabolic Panel:    Component Value Date/Time   NA 137  09/12/2016 0307   NA 137 09/12/2016   NA 137 01/15/2016 0841   K 4.5 09/12/2016 0307   K 4.7 01/15/2016 0841   CL 109 09/12/2016 0307   CO2 21 (L) 09/12/2016 8099  CO2 20 (L) 01/15/2016 0841   GLUCOSE 111 (H) 09/12/2016 0307   GLUCOSE 112 01/15/2016 0841   BUN 32 (H) 09/12/2016 0307   BUN 32 (A) 09/12/2016   BUN 49.8 (H) 01/15/2016 0841   CREATININE 1.83 (H) 09/12/2016 0307   CREATININE 2.68 (H) 08/04/2016 1218   CREATININE 2.5 (H) 01/15/2016 0841   CALCIUM 9.0 09/12/2016 0307   CALCIUM 8.8 01/15/2016 0841   PROT 6.5 09/08/2016 0615   PROT 7.0 01/15/2016 0841   ALBUMIN 2.5 (L) 09/08/2016 0615   ALBUMIN 3.3 (L) 01/15/2016 0841   AST 20 09/08/2016 0615   AST 16 01/15/2016 0841   ALT 15 (L) 09/08/2016 0615   ALT 12 01/15/2016 0841   ALKPHOS 69 09/08/2016 0615   ALKPHOS 83 01/15/2016 0841   BILITOT 0.5 09/08/2016 0615   BILITOT 0.31 01/15/2016 0841   GFRNONAA 32 (L) 09/12/2016 0307   GFRNONAA 21 (L) 08/04/2016 1218   GFRAA 37 (L) 09/12/2016 0307   GFRAA 24 (L) 08/04/2016 1218     Recent Labs  11/23/15 0922  02/28/16 1046 05/30/16 0901  09/10/16 0532 09/11/16 09/11/16 0525 09/12/16 09/12/16 0307  NA 137  < > 138 137  < > 138 138 138 137 137  K 4.5  < > 4.5 4.6  < > 4.6 4.6 4.6  --  4.5  CL 105  --  105 106  < > 111  --  109  --  109  CO2 24  < > 21 17*  < > 20*  --  21*  --  21*  GLUCOSE 99  < > 106* 112*  < > 120*  --  105*  --  111*  BUN 42*  < > 39* 55*  < > 29* 30* 30* 32* 32*  CREATININE 2.25*  < > 2.00* 2.56*  < > 1.90* 1.9* 1.85* 1.8* 1.83*  CALCIUM 8.5*  < > 8.8 8.6  < > 8.7*  --  9.0  --  9.0  MG 2.4  --  2.3 2.3  --   --   --   --   --   --   < > = values in this interval not displayed. Liver Function Tests:  Recent Labs  05/30/16 0901  09/07/16 1608 09/08/16 09/08/16 0615  AST 13  < > '22 22 20  '$ ALT 11  < > 17 17 15*  ALKPHOS 71  < > 77 77 69  BILITOT 0.2  --  0.5  --  0.5  PROT 6.2  --  6.9  --  6.5  ALBUMIN 3.3*  --  2.7*  --  2.5*  < > =  values in this interval not displayed. No results for input(s): LIPASE, AMYLASE in the last 8760 hours. No results for input(s): AMMONIA in the last 8760 hours. CBC:  Recent Labs  07/24/16 1720  09/07/16 1608  09/08/16 0615 09/09/16 09/09/16 0506 09/11/16 09/11/16 0525  WBC 8.2  < > 9.7  < > 9.3 8.3 8.3 9.4 9.4  NEUTROABS 4,920  --  7.3  --   --   --  5.5  --   --   HGB 10.9*  < > 9.5*  < > 9.2* 9.0* 9.0*  --  9.3*  HCT 32.7*  < > 29.4*  < > 28.9* 28* 28.1*  --  29.3*  MCV 95.6  --  96.4  --  96.0  --  96.2  --  95.1  PLT 246  < >  241  < > 256 246 246  --  277  < > = values in this interval not displayed. Lipid  Recent Labs  11/23/15 0922 02/28/16 1046 05/30/16 0901  CHOL 130 135 132  HDL 36* 36* 38*  LDLCALC 81 83 81  TRIG 67 78 63    Cardiac Enzymes: No results for input(s): CKTOTAL, CKMB, CKMBINDEX, TROPONINI in the last 8760 hours. BNP: No results for input(s): BNP in the last 8760 hours. Lab Results  Component Value Date   MICROALBUR 3.24 (H) 04/13/2014   Lab Results  Component Value Date   HGBA1C 6.3 (H) 09/07/2016   Lab Results  Component Value Date   TSH 0.78 05/30/2016   No results found for: VITAMINB12 No results found for: FOLATE Lab Results  Component Value Date   IRON 44 (L) 07/04/2016   TIBC 290 07/04/2016   FERRITIN 112 07/04/2016    Imaging and Procedures obtained prior to SNF admission: Mr Foot Right Wo Contrast  Result Date: 09/08/2016 CLINICAL DATA:  Chronic kidney disease. Nonhealing medial right first MP joint ulcer. Right lateral ankle ulcer. EXAM: MRI OF THE RIGHT FOREFOOT WITHOUT CONTRAST TECHNIQUE: Multiplanar, multisequence MR imaging was performed. No intravenous contrast was administered. COMPARISON:  None. FINDINGS: Bones/Joint/Cartilage Soft tissue ulcer along the medial aspect of the first metatarsal head. Mild underlying cortical irregularity of the medial margin of the first metatarsal head with underlying marrow edema  concerning for osteomyelitis. No other areas of bone destruction or periosteal reaction. Mild marrow edema in the fifth metatarsal head likely reactive. Hallux valgus of the right foot with moderate osteoarthritis of the first MTP joint. Mild osteoarthritis of the first IP joint. Mild osteoarthritis of the second and third MTP joints. No fracture or dislocation. No joint effusion. Ligaments Normal collateral ligaments.  Normal Lisfranc ligament. Muscles and Tendons Flexor, peroneal and extensor compartment tendons are intact. Increased signal in the plantar musculature likely neurogenic. Soft tissue No fluid collection or hematoma. No soft tissue mass. Severe soft tissue edema along the dorsal aspect of the midfoot and forefoot which may reflect severe cellulitis versus reactive edema. IMPRESSION: 1. Soft tissue ulcer along the medial aspect of the first metatarsal head. Mild underlying cortical irregularity of the medial margin of the first metatarsal head with underlying marrow edema concerning for osteomyelitis. 2. Severe soft tissue edema along the dorsal aspect of the midfoot and forefoot which may reflect severe cellulitis versus reactive edema. 3. No drainable fluid collection to suggest an abscess. 4. Hallux valgus of the right foot with moderate osteoarthritis of the first MTP joint. Electronically Signed   By: Kathreen Devoid   On: 09/08/2016 08:24   Dg Foot Complete Right  Result Date: 09/07/2016 CLINICAL DATA:  Right foot pain, infection EXAM: RIGHT FOOT COMPLETE - 3+ VIEW COMPARISON:  08/07/2016 FINDINGS: No fracture or dislocation is seen. Degenerative changes at the 1st MTP joint with associated hallux valgus deformity. Soft tissue swelling/wound along the medial aspect of the 1st metatarsal head. No definite underlying cortical destruction. Additional mild degenerative changes at the 2nd and 3rd MTP joints. IMPRESSION: Soft tissue swelling/wound along the medial aspect of the 1st metatarsal head.  No definite underlying cortical destruction to suggest acute osteomyelitis. Additional degenerative changes, as above. Electronically Signed   By: Julian Hy M.D.   On: 09/07/2016 17:53     Not all labs, radiology exams or other studies done during hospitalization come through on my EPIC note; however they are reviewed by me.  Assessment and Plan  OSTEOMYELYTIS R GREAT TOE/FOOT CELLULITIS/ULCER R ANKLE- s/p debidement of foot and IV vancomycin SNF - admitted for OT/PT; 7 day course of augmentin  HTN - losartan stopped 2/2 AKI SNF - cont norvasc 10 mg daily and bumex '2mg'$  daily; BMP to follow  HYPOTHYROIDISM SNF - cont synthroid 125 mcg daily  RENAL CELL CA WITH METS TO LUNG SNF - followed by Dr Osker Mason, Onc  ANEMIA SNF - cont iron daily  CKD4- re[ported stable SNF - f/u BMP  HLD - not stated as uncontrolled SNF - cont pravachol 40 mg qHS   Time spent > 45 min;> 50% of time with patient was spent reviewing records, labs, tests and studies, counseling and developing plan of care  Webb Silversmith D. Sheppard Coil, MD

## 2016-09-16 ENCOUNTER — Non-Acute Institutional Stay (SKILLED_NURSING_FACILITY): Payer: Medicare Other | Admitting: Internal Medicine

## 2016-09-16 ENCOUNTER — Encounter: Payer: Self-pay | Admitting: Internal Medicine

## 2016-09-16 DIAGNOSIS — I10 Essential (primary) hypertension: Secondary | ICD-10-CM

## 2016-09-16 DIAGNOSIS — C642 Malignant neoplasm of left kidney, except renal pelvis: Secondary | ICD-10-CM

## 2016-09-16 DIAGNOSIS — N184 Chronic kidney disease, stage 4 (severe): Secondary | ICD-10-CM | POA: Diagnosis not present

## 2016-09-16 DIAGNOSIS — C7801 Secondary malignant neoplasm of right lung: Secondary | ICD-10-CM | POA: Diagnosis not present

## 2016-09-16 DIAGNOSIS — D508 Other iron deficiency anemias: Secondary | ICD-10-CM

## 2016-09-16 DIAGNOSIS — E782 Mixed hyperlipidemia: Secondary | ICD-10-CM

## 2016-09-16 DIAGNOSIS — M86171 Other acute osteomyelitis, right ankle and foot: Secondary | ICD-10-CM | POA: Diagnosis not present

## 2016-09-16 DIAGNOSIS — L03115 Cellulitis of right lower limb: Secondary | ICD-10-CM | POA: Diagnosis not present

## 2016-09-16 DIAGNOSIS — E034 Atrophy of thyroid (acquired): Secondary | ICD-10-CM | POA: Diagnosis not present

## 2016-09-16 DIAGNOSIS — L97312 Non-pressure chronic ulcer of right ankle with fat layer exposed: Secondary | ICD-10-CM

## 2016-09-16 NOTE — Progress Notes (Signed)
Location:  [place] Room Number: [phone] Place of Service:  SNF [phone])  Noah Delaine. Sheppard Coil, MD  PCP: Alesia Richards, MD Patient Care Team: Unk Pinto, MD as PCP - General (Internal Medicine) Jacolyn Reedy, MD as Consulting Physician (Cardiology) Shon Hough, MD as Consulting Physician (Ophthalmology) Teena Irani, MD as Consulting Physician (Gastroenterology) Ninetta Lights, MD as Consulting Physician (Orthopedic Surgery) Raynelle Bring, MD as Consulting Physician (Urology) Karie Chimera, MD as Consulting Physician (Neurosurgery) Marygrace Drought, MD as Consulting Physician (Ophthalmology)  Extended Emergency Contact Information Primary Emergency Contact: [facility] Address: [address]          [place], [place] [address] Johnnette Litter of [place] Phone: [phone] Mobile Phone: [phone] Relation: Daughter  No Known Allergies  Chief Complaint  Patient presents with  . Discharge Note    Discharged from SNF    HPI:  82 y.o. male with HTN, HLD, Gout, Renal Cell Carcinoma s/p L Nephrectomy with Mets to Lung s/p Radiation Tx, IDA and Anemia of Chronic Disease, CKD Stage 3-4, Difficulty Hearing, GERD, CAD, and other comorbids who presentedto the ER with a non-healing medial Right 1st MP joint and Lateral Ankle Ulcer. Patient hadbeen seeing the wound clinic and statedit hadgotten worse in the last week with increased pain, swelling, and tenderness to the touch. He statedthe ulcer started 3 months ago, and hadprogressively gotten worse in the last week. He hadbeen on outpatient Abx with Doxycycline and Ciprofloxacin and has not had improvement. Pt was admitted to [facility] from 10/29-11/3 where pt was dx with acute osteomyelitis of R great toe and foot cellulitis.  Cx grew out MSSA. Pt was tx with IV vancomycin and surgical debridement with placement of antibiotic beads. Pt was admitted to SNF for OT/PT and is now ready to be d/c to  home.    Past Medical History:  Diagnosis Date  . Anemia   . Arthritis    stenosis - back, neck  . Cancer [facility])    renal neoplasm  . Chronic kidney disease    renalCa- nephrectomy [facility]) 04/2012  . Coronary artery disease   . GERD (gastroesophageal reflux disease)   . Hard of hearing   . Hearing deficit    hearing aids- bilateral  . Hypertension    sees Dr. Wynonia Lawman, stress test recently for prep for surgery  . Hypothyroidism   . 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 Prediabetes   . Vitamin D deficiency     Past Surgical History:  Procedure Laterality Date  . ANTERIOR CERVICAL DECOMP/DISCECTOMY FUSION N/A 01/18/2013   Procedure: ANTERIOR CERVICAL DECOMPRESSION/DISCECTOMY FUSION 2 LEVELS;  Surgeon: Faythe Ghee, MD;  Location: [place] [facility];  Service: Neurosurgery;  Laterality: N/A;  . APPENDECTOMY    . BACK SURGERY     lower back surgery - 1990's  . CHOLECYSTECTOMY    . CORONARY ANGIOPLASTY     stents , + 7-43yr. ago  . EYE SURGERY     right eye cataract surgery   . I&D EXTREMITY Right 09/08/2016   Procedure: IRRIGATION AND DEBRIDEMENT RIGHT FOOT WITH ANTIBIOTIC BEADS;  Surgeon: BEdrick Kins DPM;  Location: [facility]  Service: Podiatry;  Laterality: Right;  . JOINT REPLACEMENT     bilateral knee replacements   . LAPAROSCOPIC NEPHRECTOMY  04/29/2012   Procedure: LAPAROSCOPIC NEPHRECTOMY;  Surgeon: LDutch Gray MD;  Location: [facility];  Service: Urology;  Laterality: Left;      . TOE ARTHROPLASTY Right 09/08/2016   Procedure: Arthroplasty of First MPJ RIGHT FOOT;  Surgeon: Edrick Kins, DPM;  Location: [place];  Service: Podiatry;  Laterality: Right;  . TONSILLECTOMY    . WOUND DEBRIDEMENT Right 09/08/2016   Procedure: DEBRIDEMENT RIGHT ANKLE WOUND;  Surgeon: Edrick Kins, DPM;  Location: [place];  Service: Podiatry;  Laterality: Right;     reports that he quit smoking about 45 years ago. His smoking use  included Cigarettes. He has never used smokeless tobacco. He reports that he drinks about 6.6 oz of alcohol per week . He reports that he does not use drugs. Social History   Social History  . Marital status: Widowed    Spouse name: N/A  . Number of children: N/A  . Years of education: N/A   Occupational History  . Not on file.   Social History Main Topics  . Smoking status: Former Smoker    Types: Cigarettes    Quit date: 11/10/1970  . Smokeless tobacco: Never Used  . Alcohol use 6.6 oz/week    2 Cans of beer, 9 Shots of liquor per week     Comment: friday- beer & 3 drambuie  . Drug use: No  . Sexual activity: No   Other Topics Concern  . Not on file   Social History Narrative  . No narrative on file    Pertinent  Health Maintenance Due  Topic Date Due  . INFLUENZA VACCINE  Completed  . PNA vac Low Risk Adult  Completed    Medications:   Medication List       Accurate as of 09/16/16 11:09 AM. Always use your most recent med list.          allopurinol 300 MG tablet Commonly known as:  ZYLOPRIM TAKE 1 TABLET DAILY   amLODipine 10 MG tablet Commonly known as:  NORVASC Take 1 tablet (10 mg total) by mouth daily before breakfast.   amoxicillin-clavulanate 875-125 MG tablet Commonly known as:  AUGMENTIN Take 1 tablet by mouth every 12 (twelve) hours.   aspirin EC 81 MG tablet Take 81 mg by mouth daily.   bumetanide 2 MG tablet Commonly known as:  BUMEX TAKE ONE-HALF (1/2) TO ONE TABLET DAILY FOR FLUID   carvedilol 3.125 MG tablet Commonly known as:  COREG Take 1 tablet (3.125 mg total) by mouth 2 (two) times daily with a meal.   diclofenac sodium 1 % Gel Commonly known as:  VOLTAREN Apply 4 g topically 4 (four) times daily.   FERROUS SULFATE PO Take 3 tablets by mouth daily.   guaiFENesin 600 MG 12 hr tablet Commonly known as:  MUCINEX Take 1 tablet (600 mg total) by mouth 2 (two) times daily.   heparin 5000 UNIT/ML injection Inject 1 mL (5,000  Units total) into the skin every 8 (eight) hours.   levothyroxine 150 MCG tablet Commonly known as:  SYNTHROID, LEVOTHROID Take 1 pill daily but take 1.5 pills on Sunday, take 1 hour before food, only with water.   Magnesium 250 MG Tabs Take 500 mg by mouth daily.   menthol-cetylpyridinium 3 MG lozenge Commonly known as:  CEPACOL Take 1 lozenge (3 mg total) by mouth as needed for sore throat.   multivitamin with minerals Tabs tablet Take 1 tablet by mouth daily.   nitroGLYCERIN 0.4 MG SL tablet Commonly known as:  NITROSTAT Place 0.4 mg under the tongue every 5 (five) minutes as needed  for chest pain.   pravastatin 40 MG tablet Commonly known as:  PRAVACHOL Take 1 tablet (40 mg total) by mouth at bedtime.   traMADol 50 MG tablet Commonly known as:  ULTRAM Take 1 tablet (50 mg total) by mouth 3 (three) times daily as needed for severe pain.   vitamin C 500 MG tablet Commonly known as:  ASCORBIC ACID Take 1,000 mg by mouth daily.   Vitamin D3 1000 units Caps Take 2,000 capsules by mouth daily.        Vitals:   09/16/16 1103  BP: 139/63  Pulse: 72  Resp: 17  Temp: 97.2 F (36.2 C)  SpO2: 97%  Weight: 257 lb (116.6 kg)  Height: '5\' 9"'$  (1.753 m)   Body mass index is 37.95 kg/m.  Physical Exam  GENERAL APPEARANCE: Alert, conversant. No acute distress.  HEENT: Unremarkable. RESPIRATORY: Breathing is even, unlabored. Lung sounds are clear   CARDIOVASCULAR: Heart RRR no murmurs, rubs or gallops. No peripheral edema.  GASTROINTESTINAL: Abdomen is soft, non-tender, not distended w/ normal bowel sounds.  NEUROLOGIC: Cranial nerves 2-12 grossly intact. Moves all extremities   Labs reviewed: Basic Metabolic Panel:  Recent Labs  11/23/15 0922  02/28/16 1046 05/30/16 0901  09/10/16 0532 09/11/16 09/11/16 0525 09/12/16 09/12/16 0307  NA 137  < > 138 137  < > 138 138 138 137 137  K 4.5  < > 4.5 4.6  < > 4.6 4.6 4.6  --  4.5  CL 105  --  105 106  < > 111  --   109  --  109  CO2 24  < > 21 17*  < > 20*  --  21*  --  21*  GLUCOSE 99  < > 106* 112*  < > 120*  --  105*  --  111*  BUN 42*  < > 39* 55*  < > 29* 30* 30* 32* 32*  CREATININE 2.25*  < > 2.00* 2.56*  < > 1.90* 1.9* 1.85* 1.8* 1.83*  CALCIUM 8.5*  < > 8.8 8.6  < > 8.7*  --  9.0  --  9.0  MG 2.4  --  2.3 2.3  --   --   --   --   --   --   < > = values in this interval not displayed. Lab Results  Component Value Date   MICROALBUR 3.24 (H) 04/13/2014   Liver Function Tests:  Recent Labs  05/30/16 0901  09/07/16 1608 09/08/16 09/08/16 0615  AST 13  < > '22 22 20  '$ ALT 11  < > 17 17 15*  ALKPHOS 71  < > 77 77 69  BILITOT 0.2  --  0.5  --  0.5  PROT 6.2  --  6.9  --  6.5  ALBUMIN 3.3*  --  2.7*  --  2.5*  < > = values in this interval not displayed. No results for input(s): LIPASE, AMYLASE in the last 8760 hours. No results for input(s): AMMONIA in the last 8760 hours. CBC:  Recent Labs  07/24/16 1720  09/07/16 1608  09/08/16 0615 09/09/16 09/09/16 0506 09/11/16 09/11/16 0525  WBC 8.2  < > 9.7  < > 9.3 8.3 8.3 9.4 9.4  NEUTROABS 4,920  --  7.3  --   --   --  5.5  --   --   HGB 10.9*  < > 9.5*  < > 9.2* 9.0* 9.0*  --  9.3*  HCT 32.7*  < >  29.4*  < > 28.9* 28* 28.1*  --  29.3*  MCV 95.6  --  96.4  --  96.0  --  96.2  --  95.1  PLT 246  < > 241  < > 256 246 246  --  277  < > = values in this interval not displayed. Lipid  Recent Labs  11/23/15 0922 02/28/16 1046 05/30/16 0901  CHOL 130 135 132  HDL 36* 36* 38*  LDLCALC 81 83 81  TRIG 67 78 63   Cardiac Enzymes: No results for input(s): CKTOTAL, CKMB, CKMBINDEX, TROPONINI in the last 8760 hours. BNP: No results for input(s): BNP in the last 8760 hours. CBG: No results for input(s): GLUCAP in the last 8760 hours.  Procedures and Imaging Studies During Stay: Mr Foot Right Wo Contrast  Result Date: 09/08/2016 CLINICAL DATA:  Chronic kidney disease. Nonhealing medial right first MP joint ulcer. Right lateral ankle  ulcer. EXAM: MRI OF THE RIGHT FOREFOOT WITHOUT CONTRAST TECHNIQUE: Multiplanar, multisequence MR imaging was performed. No intravenous contrast was administered. COMPARISON:  None. FINDINGS: Bones/Joint/Cartilage Soft tissue ulcer along the medial aspect of the first metatarsal head. Mild underlying cortical irregularity of the medial margin of the first metatarsal head with underlying marrow edema concerning for osteomyelitis. No other areas of bone destruction or periosteal reaction. Mild marrow edema in the fifth metatarsal head likely reactive. Hallux valgus of the right foot with moderate osteoarthritis of the first MTP joint. Mild osteoarthritis of the first IP joint. Mild osteoarthritis of the second and third MTP joints. No fracture or dislocation. No joint effusion. Ligaments Normal collateral ligaments.  Normal Lisfranc ligament. Muscles and Tendons Flexor, peroneal and extensor compartment tendons are intact. Increased signal in the plantar musculature likely neurogenic. Soft tissue No fluid collection or hematoma. No soft tissue mass. Severe soft tissue edema along the dorsal aspect of the midfoot and forefoot which may reflect severe cellulitis versus reactive edema. IMPRESSION: 1. Soft tissue ulcer along the medial aspect of the first metatarsal head. Mild underlying cortical irregularity of the medial margin of the first metatarsal head with underlying marrow edema concerning for osteomyelitis. 2. Severe soft tissue edema along the dorsal aspect of the midfoot and forefoot which may reflect severe cellulitis versus reactive edema. 3. No drainable fluid collection to suggest an abscess. 4. Hallux valgus of the right foot with moderate osteoarthritis of the first MTP joint. Electronically Signed   By: Kathreen Devoid   On: 09/08/2016 08:24   Dg Foot Complete Right  Result Date: 09/10/2016 CLINICAL DATA:  Postoperative radiograph of the right foot. Initial encounter. EXAM: RIGHT FOOT COMPLETE - 3+ VIEW  COMPARISON:  Right foot MRI performed 09/07/2016 FINDINGS: The patient is status post resection of the distal first metatarsal and the base of the first proximal phalanx, with associated high-density beads. Visualized joint spaces are grossly unremarkable. Mild overlying soft tissue swelling is noted. Scattered vascular calcifications are seen. The subtalar joint is grossly unremarkable. Mild degenerative change is noted at the midfoot. IMPRESSION: 1. Status post resection of distal first metatarsal and base of the first proximal phalanx, with associated high-density beads. 2. Scattered vascular calcifications seen. Electronically Signed   By: Garald Balding M.D.   On: 09/10/2016 00:46   Dg Foot Complete Right  Result Date: 09/07/2016 CLINICAL DATA:  Right foot pain, infection EXAM: RIGHT FOOT COMPLETE - 3+ VIEW COMPARISON:  08/07/2016 FINDINGS: No fracture or dislocation is seen. Degenerative changes at the 1st MTP joint with associated  hallux valgus deformity. Soft tissue swelling/wound along the medial aspect of the 1st metatarsal head. No definite underlying cortical destruction. Additional mild degenerative changes at the 2nd and 3rd MTP joints. IMPRESSION: Soft tissue swelling/wound along the medial aspect of the 1st metatarsal head. No definite underlying cortical destruction to suggest acute osteomyelitis. Additional degenerative changes, as above. Electronically Signed   By: Julian Hy M.D.   On: 09/07/2016 17:53    Assessment/Plan:   No diagnosis found.   Patient is being discharged with the following home health services: HH/OT/PT/Nursing   Patient is being discharged with the following durable medical equipment:  none  Patient has been advised to f/u with their PCP in 1-2 weeks to bring them up to date on their rehab stay.  Social services at facility was responsible for arranging this appointment.  Pt was provided with a 30 day supply of prescriptions for medications and refills  must be obtained from their PCP.  For controlled substances, a more limited supply may be provided adequate until PCP appointment     Time spent > 30 min;> 50% of time with patient was spent reviewing records, labs, tests and studies, counseling and developing plan of care  Webb Silversmith D. Sheppard Coil, MD

## 2016-09-17 ENCOUNTER — Ambulatory Visit: Payer: Medicare Other | Admitting: Podiatry

## 2016-09-17 DIAGNOSIS — L97312 Non-pressure chronic ulcer of right ankle with fat layer exposed: Secondary | ICD-10-CM

## 2016-09-17 DIAGNOSIS — L97519 Non-pressure chronic ulcer of other part of right foot with unspecified severity: Principal | ICD-10-CM

## 2016-09-17 DIAGNOSIS — I83015 Varicose veins of right lower extremity with ulcer other part of foot: Secondary | ICD-10-CM

## 2016-09-17 DIAGNOSIS — I83005 Varicose veins of unspecified lower extremity with ulcer other part of foot: Secondary | ICD-10-CM

## 2016-09-17 DIAGNOSIS — Z9889 Other specified postprocedural states: Secondary | ICD-10-CM

## 2016-09-17 DIAGNOSIS — L97502 Non-pressure chronic ulcer of other part of unspecified foot with fat layer exposed: Secondary | ICD-10-CM

## 2016-09-18 ENCOUNTER — Encounter: Payer: Self-pay | Admitting: Internal Medicine

## 2016-09-18 ENCOUNTER — Ambulatory Visit: Payer: Medicare Other | Admitting: Podiatry

## 2016-09-18 ENCOUNTER — Telehealth: Payer: Self-pay | Admitting: *Deleted

## 2016-09-18 NOTE — Telephone Encounter (Addendum)
-----   Message from Edrick Kins, DPM sent at 09/17/2016  5:30 PM EST ----- Regarding: [facility] orders Home health dressing change orders RIGHT foot. Daily x 6 weeks.  Family wants to use [facility].  Contact is Ladona Horns.  Email : [email or web address] Phone : [phone]  Dx : s/p joint arthroplasty, excision of osteomyelitis, incision and drainage RT foot. Ankle ulcer RT ankle lateral.   - cleanse foot and wound with normal saline or wound cleanse. Dry.  - leave steristrips intact.  - paint incision to RT forefoot with iodine.  - apply xeroform and gauze to ankle ulcer.  - dress with 4x4 gauze, kerlex, ace wrap.   Dr. Amalia Hailey. 09/18/2016-Faxed copy of 09/17/2016 orders to [facility] with demographics, without clinicals. 09/22/2016-Clinical of last visit faxed to [facility].

## 2016-09-19 DIAGNOSIS — D631 Anemia in chronic kidney disease: Secondary | ICD-10-CM | POA: Diagnosis not present

## 2016-09-19 DIAGNOSIS — Z85118 Personal history of other malignant neoplasm of bronchus and lung: Secondary | ICD-10-CM

## 2016-09-19 DIAGNOSIS — Z87891 Personal history of nicotine dependence: Secondary | ICD-10-CM

## 2016-09-19 DIAGNOSIS — N184 Chronic kidney disease, stage 4 (severe): Secondary | ICD-10-CM | POA: Diagnosis not present

## 2016-09-19 DIAGNOSIS — M109 Gout, unspecified: Secondary | ICD-10-CM | POA: Diagnosis not present

## 2016-09-19 DIAGNOSIS — K219 Gastro-esophageal reflux disease without esophagitis: Secondary | ICD-10-CM | POA: Diagnosis not present

## 2016-09-19 DIAGNOSIS — E785 Hyperlipidemia, unspecified: Secondary | ICD-10-CM | POA: Diagnosis not present

## 2016-09-19 DIAGNOSIS — M86171 Other acute osteomyelitis, right ankle and foot: Secondary | ICD-10-CM | POA: Diagnosis not present

## 2016-09-19 DIAGNOSIS — I129 Hypertensive chronic kidney disease with stage 1 through stage 4 chronic kidney disease, or unspecified chronic kidney disease: Secondary | ICD-10-CM | POA: Diagnosis not present

## 2016-09-19 DIAGNOSIS — Z85528 Personal history of other malignant neoplasm of kidney: Secondary | ICD-10-CM

## 2016-09-19 DIAGNOSIS — E669 Obesity, unspecified: Secondary | ICD-10-CM | POA: Diagnosis not present

## 2016-09-19 DIAGNOSIS — Z4781 Encounter for orthopedic aftercare following surgical amputation: Secondary | ICD-10-CM | POA: Diagnosis not present

## 2016-09-19 DIAGNOSIS — R531 Weakness: Secondary | ICD-10-CM | POA: Diagnosis not present

## 2016-09-19 DIAGNOSIS — I2510 Atherosclerotic heart disease of native coronary artery without angina pectoris: Secondary | ICD-10-CM | POA: Diagnosis not present

## 2016-09-19 DIAGNOSIS — R7303 Prediabetes: Secondary | ICD-10-CM | POA: Diagnosis not present

## 2016-09-19 NOTE — Telephone Encounter (Deleted)
-----   Message from Edrick Kins, DPM sent at 09/17/2016  5:30 PM EST ----- Regarding: [place] orders Home health dressing change orders RIGHT foot. Daily x 6 weeks.  Family wants to use [facility].  Contact is Ladona Horns.  Email : [email or web address] Phone : [phone]  Dx : s/p joint arthroplasty, excision of osteomyelitis, incision and drainage RT foot. Ankle ulcer RT ankle lateral.   - cleanse foot and wound with normal saline or wound cleanse. Dry.  - leave steristrips intact.  - paint incision to RT forefoot with iodine.  - apply xeroform and gauze to ankle ulcer.  - dress with 4x4 gauze, kerlex, ace wrap.   Dr. Amalia Hailey

## 2016-09-21 NOTE — Progress Notes (Signed)
Patient ID: [name], male   DOB: 12/29/1928, 81 y.o.   MRN: [id number] Subjective: Patient presents today status post osteomyelitis resection with cellulitis to the right foot. Date of surgery was 09/08/2016. Patient states that he has minimal pain and that he is feeling well.  Objective: Today dressings were removed and changed. There is extensive drainage noted within the incision site, which was anticipated due to placement of antibiotic beads. Sutures and skin staples are intact. There is. Incisional maceration noted.  Ulceration noted to the right ankle overlying the lateral fibular malleolus measuring 1.5 cm 1.5 cm 0.1 cm. To the noted ulceration there is no eschar, there is a minimal amount of slough fibrin and necrotic tissue noted. There is no exposed bone muscle-tendon ligament or joint. There is a minimal amount of serosanguineous drainage noted. Granulation tissue and wound base is pink. Periwound integrity is intact.  Assessment:  #1 status post osteomyelitis resection with cellulitis to the right foot with placement of antibiotic beads-first MPJ. #2 ulceration lateral ankle right secondary to venous insufficiency  Plan of care: #1 today patient was evaluated Dressings were changed. #2 patient is going to be discharged from the rehabilitation facility that he is currently residing and. Orders for home health dressing changes were placed today. #3 medically necessary excisional debridement including subcutaneous tissue was performed to the lateral ankle ulceration using a tissue nipper. Excisional debridement of all necrotic nonviable tissue down to healthy viable tissue was performed with post-debridement measurements same as pre-. #4 return to clinic in 2 weeks

## 2016-09-22 ENCOUNTER — Ambulatory Visit (INDEPENDENT_AMBULATORY_CARE_PROVIDER_SITE_OTHER): Payer: Medicare Other | Admitting: Internal Medicine

## 2016-09-22 ENCOUNTER — Encounter: Payer: Self-pay | Admitting: Internal Medicine

## 2016-09-22 VITALS — BP 140/62 | HR 70 | Temp 98.2°F | Resp 16

## 2016-09-22 DIAGNOSIS — Z79899 Other long term (current) drug therapy: Secondary | ICD-10-CM | POA: Diagnosis not present

## 2016-09-22 DIAGNOSIS — I10 Essential (primary) hypertension: Secondary | ICD-10-CM | POA: Diagnosis not present

## 2016-09-22 DIAGNOSIS — N184 Chronic kidney disease, stage 4 (severe): Secondary | ICD-10-CM

## 2016-09-22 DIAGNOSIS — R609 Edema, unspecified: Secondary | ICD-10-CM

## 2016-09-22 LAB — BASIC METABOLIC PANEL WITH GFR
BUN: 50 mg/dL — ABNORMAL HIGH (ref 7–25)
CHLORIDE: 102 mmol/L (ref 98–110)
CO2: 21 mmol/L (ref 20–31)
CREATININE: 2.73 mg/dL — AB (ref 0.70–1.11)
Calcium: 8.7 mg/dL (ref 8.6–10.3)
GFR, Est African American: 23 mL/min — ABNORMAL LOW (ref >=60)
GFR, Est Non African American: 20 mL/min — ABNORMAL LOW (ref >=60)
Glucose, Bld: 172 mg/dL — ABNORMAL HIGH (ref 65–99)
POTASSIUM: 4.5 mmol/L (ref 3.5–5.3)
SODIUM: 134 mmol/L — AB (ref 135–146)

## 2016-09-22 LAB — CBC WITH DIFFERENTIAL/PLATELET
BASOS PCT: 1 %
Basophils Absolute: 99 cells/uL (ref 0–200)
EOS PCT: 6 %
Eosinophils Absolute: 594 cells/uL — ABNORMAL HIGH (ref 15–500)
HCT: 29.5 % — ABNORMAL LOW (ref 38.5–50.0)
Hemoglobin: 9.4 g/dL — ABNORMAL LOW (ref 13.2–17.1)
Lymphocytes Relative: 23 %
Lymphs Abs: 2277 cells/uL (ref 850–3900)
MCH: 31 pg (ref 27.0–33.0)
MCHC: 31.9 g/dL — ABNORMAL LOW (ref 32.0–36.0)
MCV: 97.4 fL (ref 80.0–100.0)
MONOS PCT: 7 %
MPV: 9.5 fL (ref 7.5–12.5)
Monocytes Absolute: 693 cells/uL (ref 200–950)
NEUTROS ABS: 6237 {cells}/uL (ref 1500–7800)
Neutrophils Relative %: 63 %
PLATELETS: 301 10*3/uL (ref 140–400)
RBC: 3.03 MIL/uL — ABNORMAL LOW (ref 4.20–5.80)
RDW: 14.8 % (ref 11.0–15.0)
WBC: 9.9 10*3/uL (ref 3.8–10.8)

## 2016-09-22 NOTE — Patient Instructions (Signed)

## 2016-09-22 NOTE — Progress Notes (Signed)
[place] [facility] Unk Pinto, M.D.        [name]. [name], P.A.-C       Starlyn Skeans, P.A.-C  [facility]                [address] [place], [place] [address] Telephone [phone] Telefax [phone] ______________________________________________________________________     This very nice 82 y.o. WWM presents for post hospital  follow up for Osteomyelitis of the Rt foot. Patient is also followed for HTN, HLD, PreDM and Vitamin D Deficiency. 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      Patient was hospitalized 10/29-11/01/2016 with Osteomyelitis of the Rt 1st toe and on 09/08/2016 had wound I&D with antibiotic beads implanted by Dr Daylene Katayama.  Patient wa engaged post hospital discharge for close follow-up. Patient was continued on p.o. Augmentin til 09/19/16 and has f/u scheduled with Dr Amalia Hailey.  Patient did experience diarrhea which has reportedly near resolved since off of the Augmentin x 3 days. Patient is non weight bearing with the R foot and is in a wheel chair and uses a scooter at home. Post hospital meds are review and reconciled and he has resumed his pre-hospital med regimen.   Patient is treated for HTN (1997) w/Stage 4 CKD & BP has been controlled at home. Patient had PCA/Stent in 2009. Today's BP is  140/62. Patient has had no complaints of any cardiac type chest pain, palpitations, dyspnea/orthopnea/PND, dizziness, claudication, or dependent edema.     Hyperlipidemia is controlled with diet & meds. Patient denies myalgias or other med SE's. Last Lipids were at goal: Lab Results  Component Value Date   CHOL 132 05/30/2016   HDL 38 (L) 05/30/2016   LDLCALC 81 05/30/2016   TRIG 63 05/30/2016   CHOLHDL 3.5 05/30/2016      Also, the patient has history of  Morbid Obesity (BMI  38+) and consequent PreDiabetes and has had no symptoms of reactive  hypoglycemia, diabetic polys, paresthesias or visual blurring.  Last A1c was not at goal:  Lab Results  Component Value Date   HGBA1C 6.3 (H) 09/07/2016      Further, the patient also has history of Vitamin D Deficiency in 2008 of "24"  and supplements vitamin D without any suspected side-effects. Last vitamin D was at goal: Lab Results  Component Value Date   VD25OH 60 02/28/2016   Current Outpatient Prescriptions on File Prior to Visit  Medication Sig  . allopurinol (ZYLOPRIM) 300 MG tablet TAKE 1 TABLET DAILY  . amLODipine (NORVASC) 10 MG tablet Take 1 tablet (10 mg total) by mouth daily before breakfast.  . aspirin EC 81 MG tablet Take 81 mg by mouth daily.  . bumetanide (BUMEX) 2 MG tablet TAKE ONE-HALF (1/2) TO ONE TABLET DAILY FOR FLUID  . Cholecalciferol (VITAMIN D3) 1000 UNITS CAPS Take 2,000 capsules by mouth daily.   . diclofenac sodium (VOLTAREN) 1 % GEL Apply 4 g topically 4 (four) times daily.  Marland Kitchen FERROUS SULFATE PO Take 1 tablet by mouth 3 (three) times daily.   Marland Kitchen levothyroxine (SYNTHROID, LEVOTHROID) 150 MCG tablet Take 1 pill daily but take 1.5 pills on Sunday, take 1 hour before food, only with water.  . Magnesium 250 MG TABS Take 500 mg by mouth daily.   Marland Kitchen menthol-cetylpyridinium (CEPACOL) 3 MG lozenge  Take 1 lozenge (3 mg total) by mouth as needed for sore throat.  . Multiple Vitamin (MULITIVITAMIN WITH MINERALS) TABS Take 1 tablet by mouth daily.  . nitroGLYCERIN (NITROSTAT) 0.4 MG SL tablet Place 0.4 mg under the tongue every 5 (five) minutes as needed for chest pain.   . pravastatin (PRAVACHOL) 40 MG tablet Take 1 tablet (40 mg total) by mouth at bedtime.  . traMADol (ULTRAM) 50 MG tablet Take 1 tablet (50 mg total) by mouth 3 (three) times daily as needed for severe pain.  . vitamin C (ASCORBIC ACID) 500 MG tablet Take 1,000 mg by mouth daily.    No current facility-administered medications on file prior to visit.    No Known Allergies PMHx:   Past Medical  History:  Diagnosis Date  . Anemia   . Arthritis    stenosis - back, neck  . Cancer [facility])    renal neoplasm  . Chronic kidney disease    renalCa- nephrectomy [facility]) 04/2012  . Coronary artery disease   . GERD (gastroesophageal reflux disease)   . Hard of hearing   . Hearing deficit    hearing aids- bilateral  . Hypertension    sees Dr. Wynonia Lawman, stress test recently for prep for surgery  . Hypothyroidism   . Lung cancer ([place])    right lower lobe nodule suspicious for metastatic clear cell renal cell neoplasm  . Prediabetes   . Vitamin D deficiency    Immunization History  Administered Date(s) Administered  . Influenza Split 09/06/2013  . Influenza, High Dose Seasonal PF 07/20/2014, 08/20/2015, 07/24/2016  . PPD Test 09/12/2016  . Pneumococcal Conjugate-13 10/30/2014  . Pneumococcal Polysaccharide-23 02/18/2012  . Td 02/18/2012  . Zoster 03/03/2013   Past Surgical History:  Procedure Laterality Date  . ANTERIOR CERVICAL DECOMP/DISCECTOMY FUSION N/A 01/18/2013   Procedure: ANTERIOR CERVICAL DECOMPRESSION/DISCECTOMY FUSION 2 LEVELS;  Surgeon: Faythe Ghee, MD;  Location: [place] [facility];  Service: Neurosurgery;  Laterality: N/A;  . APPENDECTOMY    . BACK SURGERY     lower back surgery - 1990's  . CHOLECYSTECTOMY    . CORONARY ANGIOPLASTY     stents , + 7-31yr. ago  . EYE SURGERY     right eye cataract surgery   . I&D EXTREMITY Right 09/08/2016   Procedure: IRRIGATION AND DEBRIDEMENT RIGHT FOOT WITH ANTIBIOTIC BEADS;  Surgeon: BEdrick Kins DPM;  [place] [facility]  Service: Podiatry;  Laterality: Right;  . JOINT REPLACEMENT     bilateral knee replacements   . LAPAROSCOPIC NEPHRECTOMY  04/29/2012   Procedure: LAPAROSCOPIC NEPHRECTOMY;  Surgeon: LDutch Gray MD;  Location: [facility];  Service: Urology;  Laterality: Left;      . TOE ARTHROPLASTY Right 09/08/2016   Procedure: Arthroplasty of First MPJ RIGHT FOOT;  Surgeon: BEdrick Kins DPM;  [place] [facility]  Service: Podiatry;   Laterality: Right;  . TONSILLECTOMY    . WOUND DEBRIDEMENT Right 09/08/2016   Procedure: DEBRIDEMENT RIGHT ANKLE WOUND;  Surgeon: BEdrick Kins DPM;  [place] [facility]  Service: Podiatry;  Laterality: Right;   FHx:    Reviewed / unchanged  SHx:    Reviewed / unchanged  Systems Review:  Constitutional: Denies fever, chills, wt changes, headaches, insomnia, fatigue, night sweats, change in appetite. Eyes: Denies redness, blurred vision, diplopia, discharge, itchy, watery eyes.  ENT: Denies discharge, congestion, post nasal drip, epistaxis, sore throat, earache, hearing loss, dental pain, tinnitus, vertigo, sinus pain, snoring.  CV: Denies chest pain, palpitations,  irregular heartbeat, syncope, dyspnea, diaphoresis, orthopnea, PND, claudication or edema. Respiratory: denies cough, dyspnea, DOE, pleurisy, hoarseness, laryngitis, wheezing.  Gastrointestinal: Denies dysphagia, odynophagia, heartburn, reflux, water brash, abdominal pain or cramps, nausea, vomiting, bloating, diarrhea, constipation, hematemesis, melena, hematochezia  or hemorrhoids. Genitourinary: Denies dysuria, frequency, urgency, nocturia, hesitancy, discharge, hematuria or flank pain. Musculoskeletal: Denies arthralgias, myalgias, stiffness, jt. swelling, pain, limping or strain/sprain.  Skin: Denies pruritus, rash, hives, warts, acne, eczema or change in skin lesion(s). Neuro: No weakness, tremor, incoordination, spasms, paresthesia or pain. Psychiatric: Denies confusion, memory loss or sensory loss. Endo: Denies change in weight, skin or hair change.  Heme/Lymph: No excessive bleeding, bruising or enlarged lymph nodes.  Physical Exam BP 140/62   Pulse 70   Temp 98.2 F (36.8 C) (Temporal)   Resp 16   Appears well nourished and in no distress.  Eyes: PERRLA, EOMs, conjunctiva no swelling or erythema. Sinuses: No frontal/maxillary tenderness ENT/Mouth: EAC's clear, TM's nl w/o erythema, bulging. Nares clear w/o  erythema, swelling, exudates. Oropharynx clear without erythema or exudates. Oral hygiene is good. Tongue normal, non obstructing. Hearing intact.  Neck: Supple. Thyroid nl. Car 2+/2+ without bruits, nodes or JVD. Chest: Respirations nl with BS clear & equal w/o rales, rhonchi, wheezing or stridor.  Cor: Heart sounds normal w/ regular rate and rhythm without sig. murmurs, gallops, clicks, or rubs. Both lower extremities wrapped in una boot and unable to examine. Abdomen: Soft, rotund & bowel sounds normal. Non-tender w/o guarding, rebound, hernias, masses, or organomegaly.  Lymphatics: Unremarkable.  Musculoskeletal: Patient in a W/C today. Skin: Warm, dry without exposed rashes, lesions or ecchymosis apparent.  Neuro: Cranial nerves intact, reflexes equal bilaterally. Sensory-motor testing grossly intact. Tendon reflexes grossly flat.  Pysch: Alert & oriented x 3.  Insight and judgement nl & appropriate. No ideations.  Assessment and Plan:  1. Essential hypertension  - Continue medication, monitor blood pressure at home.  - Continue DASH diet. Reminder to go to the ER if any CP,  SOB, nausea, dizziness, severe HA, changes vision/speech,  left arm numbness and tingling and jaw pain.    2. Edema, unspecified type   3. CKD, stage IV (GFR 28 ml/min) (HCC)  - Continue diet/meds, exercise,& lifestyle modifications.  - Continue monitor  renal functions - BASIC METABOLIC PANEL WITH GFR  4. Medication management  - Monitor appropriate labs. - CBC with Differential/Platelet - BASIC METABOLIC PANEL WITH GFR       Recommended regular exercise, BP monitoring, weight control, and discussed med and SE's. Recommended labs to assess and monitor clinical status. Further disposition pending results of labs. Over 30 minutes of exam, counseling, chart review was performed. Over 40 minutes of exam, counseling, chart review and critical decision making was performed.

## 2016-09-23 ENCOUNTER — Other Ambulatory Visit: Payer: Self-pay | Admitting: Internal Medicine

## 2016-09-23 DIAGNOSIS — N184 Chronic kidney disease, stage 4 (severe): Secondary | ICD-10-CM

## 2016-09-25 ENCOUNTER — Ambulatory Visit (INDEPENDENT_AMBULATORY_CARE_PROVIDER_SITE_OTHER): Payer: Medicare Other | Admitting: Podiatry

## 2016-09-25 ENCOUNTER — Ambulatory Visit: Payer: Medicare Other | Admitting: Podiatry

## 2016-09-25 DIAGNOSIS — Z9889 Other specified postprocedural states: Secondary | ICD-10-CM

## 2016-09-25 DIAGNOSIS — L97519 Non-pressure chronic ulcer of other part of right foot with unspecified severity: Principal | ICD-10-CM

## 2016-09-25 DIAGNOSIS — L97502 Non-pressure chronic ulcer of other part of unspecified foot with fat layer exposed: Secondary | ICD-10-CM

## 2016-09-25 DIAGNOSIS — L97312 Non-pressure chronic ulcer of right ankle with fat layer exposed: Secondary | ICD-10-CM | POA: Diagnosis not present

## 2016-09-25 DIAGNOSIS — I83005 Varicose veins of unspecified lower extremity with ulcer other part of foot: Secondary | ICD-10-CM

## 2016-09-25 DIAGNOSIS — I83015 Varicose veins of right lower extremity with ulcer other part of foot: Secondary | ICD-10-CM | POA: Diagnosis not present

## 2016-09-29 ENCOUNTER — Telehealth: Payer: Self-pay | Admitting: *Deleted

## 2016-09-29 NOTE — Progress Notes (Signed)
Patient ID: [name], male   DOB: 05/22/1929, 82 y.o.   MRN: [id number] Subjective: Patient presents today status post osteomyelitis resection with cellulitis to the right foot. Date of surgery was 09/08/2016. Patient states that he has minimal pain and that he is feeling well.  Objective: Today dressings were removed and changed. There is extensive drainage noted within the incision site, which was anticipated due to placement of antibiotic beads. Sutures and skin staples are intact. There is. Incisional maceration noted.  Ulceration noted to the right ankle overlying the lateral fibular malleolus measuring 1.5 cm 1.5 cm 0.1 cm. To the noted ulceration there is no eschar, there is a minimal amount of slough fibrin and necrotic tissue noted. There is no exposed bone muscle-tendon ligament or joint. There is a minimal amount of serosanguineous drainage noted. Granulation tissue and wound base is pink. Periwound integrity is intact.  Assessment:  #1 status post osteomyelitis resection with cellulitis to the right foot with placement of antibiotic beads-first MPJ. #2 ulceration lateral ankle right secondary to venous insufficiency  Plan of care: #1 today patient was evaluated Dressings were changed. #2 patient is going to be discharged from the rehabilitation facility that he is currently residing and. Patient is not satisfied with the home health care agency that is providing the home health dressing changes. Today were going to cancel his current home health care agency and arrange a new agency to come to the house every other day.  #3 medically necessary excisional debridement including subcutaneous tissue was performed to the lateral ankle ulceration using a tissue nipper. Excisional debridement of all necrotic nonviable tissue down to healthy viable tissue was performed with post-debridement measurements same as pre-. #4 return to clinic in 2 weeks

## 2016-09-29 NOTE — Telephone Encounter (Addendum)
Dr. Amalia Hailey switched to [facility], at pt's request. Faxed required [facility] form, pt's demographics and 09/25/2016 LOV. 12/24/2015-Vanessa - [facility] states pt wound is worse, when is next appt. I spoke with Lorriane Shire and she says wound has more drainage, decrease gradulation and edges are detached. I told her pt's next appt is today at 3:00pm.

## 2016-10-08 ENCOUNTER — Ambulatory Visit (INDEPENDENT_AMBULATORY_CARE_PROVIDER_SITE_OTHER): Payer: Medicare Other | Admitting: Podiatry

## 2016-10-08 ENCOUNTER — Encounter: Payer: Self-pay | Admitting: Podiatry

## 2016-10-08 VITALS — Temp 99.3°F

## 2016-10-08 DIAGNOSIS — I83015 Varicose veins of right lower extremity with ulcer other part of foot: Secondary | ICD-10-CM | POA: Diagnosis not present

## 2016-10-08 DIAGNOSIS — L97312 Non-pressure chronic ulcer of right ankle with fat layer exposed: Secondary | ICD-10-CM

## 2016-10-08 DIAGNOSIS — Z9889 Other specified postprocedural states: Secondary | ICD-10-CM

## 2016-10-08 DIAGNOSIS — L97519 Non-pressure chronic ulcer of other part of right foot with unspecified severity: Secondary | ICD-10-CM

## 2016-10-11 ENCOUNTER — Encounter: Payer: Self-pay | Admitting: *Deleted

## 2016-10-12 NOTE — Progress Notes (Signed)
Patient ID: [name], male   DOB: [birth year], 82 y.o.   MRN: [id number] Subjective: Patient presents today status post osteomyelitis resection with cellulitis to the right foot. Date of surgery was 09/08/2016. Patient states that he has minimal pain and that he is feeling well.  Objective: Today dressings were removed and changed. There is minimal drainage noted within the incision site, which was anticipated due to placement of antibiotic beads. Sutures and skin staples are intact. There is periwound maceration noted. Surgical incision site appears to be improving. Measurements today are [phone] cm (LxWxD)  Ulceration noted to the right ankle overlying the lateral fibular malleolus measuring 1.0 cm 1.0 cm 0.1 cm. To the noted ulceration there is no eschar, there is a minimal amount of slough fibrin and necrotic tissue noted. There is no exposed bone muscle-tendon ligament or joint. There is a minimal amount of serosanguineous drainage noted. Granulation tissue and wound base is pink. Periwound integrity is intact.  Assessment:  #1 status post osteomyelitis resection with cellulitis to the right foot with placement of antibiotic beads-first MPJ. #2 ulceration lateral ankle right secondary to venous insufficiency  Plan of care: #1 today patient was evaluated Dressings were changed. #2 continue dressing changes 3 times per week arranged by home health care agency #3 medically necessary excisional debridement including subcutaneous tissue was performed to the lateral ankle ulceration using a tissue nipper. Excisional debridement of all necrotic nonviable tissue down to healthy viable tissue was performed with post-debridement measurements same as pre-. #4 return to clinic in 2 weeks

## 2016-10-14 ENCOUNTER — Encounter: Payer: Self-pay | Admitting: Internal Medicine

## 2016-10-14 ENCOUNTER — Ambulatory Visit (INDEPENDENT_AMBULATORY_CARE_PROVIDER_SITE_OTHER): Payer: Medicare Other | Admitting: Internal Medicine

## 2016-10-14 VITALS — BP 136/60 | HR 80 | Temp 97.3°F | Resp 16

## 2016-10-14 DIAGNOSIS — Z79899 Other long term (current) drug therapy: Secondary | ICD-10-CM | POA: Diagnosis not present

## 2016-10-14 DIAGNOSIS — N184 Chronic kidney disease, stage 4 (severe): Secondary | ICD-10-CM | POA: Diagnosis not present

## 2016-10-14 DIAGNOSIS — I10 Essential (primary) hypertension: Secondary | ICD-10-CM | POA: Diagnosis not present

## 2016-10-14 LAB — BASIC METABOLIC PANEL WITH GFR
BUN: 41 mg/dL — AB (ref 7–25)
CALCIUM: 8.9 mg/dL (ref 8.6–10.3)
CO2: 27 mmol/L (ref 20–31)
Chloride: 98 mmol/L (ref 98–110)
Creat: 2.41 mg/dL — ABNORMAL HIGH (ref 0.70–1.11)
GFR, EST AFRICAN AMERICAN: 27 mL/min — AB (ref >=60)
GFR, EST NON AFRICAN AMERICAN: 24 mL/min — AB (ref >=60)
GLUCOSE: 123 mg/dL — AB (ref 65–99)
Potassium: 3.9 mmol/L (ref 3.5–5.3)
Sodium: 135 mmol/L (ref 135–146)

## 2016-10-14 LAB — CBC WITH DIFFERENTIAL/PLATELET
Basophils Absolute: 89 cells/uL (ref 0–200)
Basophils Relative: 1 %
Eosinophils Absolute: 534 cells/uL — ABNORMAL HIGH (ref 15–500)
Eosinophils Relative: 6 %
HEMATOCRIT: 29.7 % — AB (ref 38.5–50.0)
Hemoglobin: 9.5 g/dL — ABNORMAL LOW (ref 13.2–17.1)
LYMPHS PCT: 20 %
Lymphs Abs: 1780 cells/uL (ref 850–3900)
MCH: 31 pg (ref 27.0–33.0)
MCHC: 32 g/dL (ref 32.0–36.0)
MCV: 97.1 fL (ref 80.0–100.0)
MONO ABS: 801 {cells}/uL (ref 200–950)
MONOS PCT: 9 %
MPV: 9.5 fL (ref 7.5–12.5)
NEUTROS PCT: 64 %
Neutro Abs: 5696 cells/uL (ref 1500–7800)
PLATELETS: 225 10*3/uL (ref 140–400)
RBC: 3.06 MIL/uL — AB (ref 4.20–5.80)
RDW: 15.5 % — AB (ref 11.0–15.0)
WBC: 8.9 10*3/uL (ref 3.8–10.8)

## 2016-10-14 NOTE — Progress Notes (Signed)
[place] [facility]   Unk Pinto, M.D.    [name]. [name], P.A.-C      Starlyn Skeans, P.A.-C  [facility]                [address] [place] [address] Telephone [phone] Telefax [phone] Subjective:    Patient ID: [name], male    DOB: 10-07-1929, 82 y.o.   MRN: [id number]  HPI   This 82 yo WWM with HTN, ASCAD, HLD, Chronic venous Insufficiency and dependent edema and metastatic RCC to Lung returns for f/u. Patient was hospitalized 10/29-11/3 for I&D and Abx beads implantation of  Osteomyelitis of his R 1st toe by Dr Daylene Katayama and apparently on f/u, patient's healing wound is reported clean & healing    Patient does have long-standing HTN (1997) and HT CKD4 and recent labs found BUN increased from 32->50 and Creat 1.83-> 2.73 with GFR dropping to 20 ml/min and patient was strongly encouraged to increase his free water intake and d/c his Losartan that he has resumed after recent hospitalization. (patient disavows consideration of Dialysis). Patient is still non weight-bearing /non-ambulatory and in a wheelchair and returns today for recheck . He denies any orthopnea/PND, CP or  Palpitations. He feels his edema is less tense.    Medication Sig  . allopurinol (ZYLOPRIM) 300 MG tablet TAKE 1 TABLET DAILY  . amLODipine (NORVASC) 10 MG tablet Take 1 tablet (10 mg total) by mouth daily before breakfast.  . aspirin EC 81 MG tablet Take 81 mg by mouth daily.  . bumetanide (BUMEX) 2 MG tablet TAKE ONE-HALF (1/2) TO ONE TABLET DAILY FOR FLUID  . Cholecalciferol (VITAMIN D3) 1000 UNITS CAPS Take 2,000 capsules by mouth daily.   . diclofenac sodium (VOLTAREN) 1 % GEL Apply 4 g topically 4 (four) times daily.  Marland Kitchen FERROUS SULFATE PO Take 1 tablet by mouth 3 (three) times daily.   Marland Kitchen levothyroxine (SYNTHROID, LEVOTHROID) 150 MCG tablet Take 1 pill daily but take 1.5 pills on Sunday, take 1 hour  before food, only with water.  . Magnesium 250 MG TABS Take 500 mg by mouth daily.   Marland Kitchen menthol-cetylpyridinium (CEPACOL) 3 MG lozenge Take 1 lozenge (3 mg total) by mouth as needed for sore throat.  . Multiple Vitamin (MULITIVITAMIN WITH MINERALS) TABS Take 1 tablet by mouth daily.  . nitroGLYCERIN (NITROSTAT) 0.4 MG SL tablet Place 0.4 mg under the tongue every 5 (five) minutes as needed for chest pain.   . pravastatin (PRAVACHOL) 40 MG tablet Take 1 tablet (40 mg total) by mouth at bedtime.  . traMADol (ULTRAM) 50 MG tablet Take 1 tablet (50 mg total) by mouth 3 (three) times daily as needed for severe pain.  . vitamin C (ASCORBIC ACID) 500 MG tablet Take 1,000 mg by mouth daily.    No Known Allergies   Past Medical History:  Diagnosis Date  . Anemia   . Arthritis    stenosis - back, neck  . Cancer [facility])    renal neoplasm  . Chronic kidney disease    renalCa- nephrectomy [facility]) 04/2012  . Coronary artery disease   . GERD (gastroesophageal reflux disease)   . Hard of hearing   . Hearing deficit    hearing aids- bilateral  . Hypertension    sees Dr. Wynonia Lawman, stress test recently for prep for surgery  .  Hypothyroidism   . Lung cancer ([place])    right lower lobe nodule suspicious for metastatic clear cell renal cell neoplasm  . Prediabetes   . Vitamin D deficiency    Review of Systems  10 point systems review negative except as above.    Objective:   Physical Exam  BP 136/60   Pulse 80   Temp 97.3 F (36.3 C)   Resp 16   Overfed and morbidly obese, in wheel and in no distress. Hearing impaired despite amplification.   HEENT - Eac's patent. TM's Nl. EOM's full. PERRLA. NasoOroPharynx clear. Neck - supple. Nl Thyroid. Carotids 2+ & No bruits, nodes, JVD Chest - Clear equal BS. Cor - Nl HS. RRR w/o sig MGR. PP obscured by 2-3+ dependent edema. MS- FROM. Muscle power, tone and bulk decreased.  Neuro - No obvious Cr N abnormalities. Nl w/o focal abnormalities. Skin - exposed  w/o cyanosis, icterus or rash with brawny pachydermia of the distal lower extremities with chronic venous stasis changes and hemosiderin pigmentation. Both  LE wrapped in Ivanhoe boot(s).     Assessment & Plan:   1. Essential hypertension   2. CKD, stage IV (GFR 28 ml/min) (HCC)  - BASIC METABOLIC PANEL WITH GFR  3. Medication management  - CBC with Differential/Platelet - BASIC METABOLIC PANEL WITH GFR

## 2016-10-14 NOTE — Progress Notes (Signed)
[place] [facility]   Unk Pinto, M.D.    [name]. [name], P.A.-C      Starlyn Skeans, P.A.-C  [facility]                [address] [place], [place] [address] Telephone [phone] Telefax [phone] Subjective:    Patient ID: [name], male    DOB: 1929/04/26, 82 y.o.   MRN: [id number]  HPI   This 82 yo WWM with HTN, ASCAD, HLD, Chronic venous Insufficiency and dependent edema and metastatic RCC to Lung returns for f/u. Patient was hospitalized 10/29-11/3 for I&D and Abx beads implantation of  Osteomyelitis of his R 1st toe by Dr Daylene Katayama and apparently on f/u, patient's healing wound is reported clean & healing    Patient does have long-standing HTN (1997) and HT CKD4 and recent labs found BUN increased from 32->50 and Creat 1.83-> 2.73 with GFR dropping to 20 ml/min and patient was strongly encouraged to increase his free water intake and d/c his Losartan that he has resumed after recent hospitalization. (patient disavows consideration of Dialysis). Patient is still non weight-bearing /non-ambulatory and in a wheelchair and returns today for recheck . He denies any orthopnea/PND, CP or  Palpitations. He feels his edema is less tense.    Medication Sig  . allopurinol (ZYLOPRIM) 300 MG tablet TAKE 1 TABLET DAILY  . amLODipine (NORVASC) 10 MG tablet Take 1 tablet (10 mg total) by mouth daily before breakfast.  . aspirin EC 81 MG tablet Take 81 mg by mouth daily.  . bumetanide (BUMEX) 2 MG tablet TAKE ONE-HALF (1/2) TO ONE TABLET DAILY FOR FLUID  . Cholecalciferol (VITAMIN D3) 1000 UNITS CAPS Take 2,000 capsules by mouth daily.   . diclofenac sodium (VOLTAREN) 1 % GEL Apply 4 g topically 4 (four) times daily.  Marland Kitchen FERROUS SULFATE PO Take 1 tablet by mouth 3 (three) times daily.   Marland Kitchen levothyroxine (SYNTHROID, LEVOTHROID) 150 MCG tablet Take 1 pill daily but take 1.5 pills on Sunday, take 1 hour  before food, only with water.  . Magnesium 250 MG TABS Take 500 mg by mouth daily.   Marland Kitchen menthol-cetylpyridinium (CEPACOL) 3 MG lozenge Take 1 lozenge (3 mg total) by mouth as needed for sore throat.  . Multiple Vitamin (MULITIVITAMIN WITH MINERALS) TABS Take 1 tablet by mouth daily.  . nitroGLYCERIN (NITROSTAT) 0.4 MG SL tablet Place 0.4 mg under the tongue every 5 (five) minutes as needed for chest pain.   . pravastatin (PRAVACHOL) 40 MG tablet Take 1 tablet (40 mg total) by mouth at bedtime.  . traMADol (ULTRAM) 50 MG tablet Take 1 tablet (50 mg total) by mouth 3 (three) times daily as needed for severe pain.  . vitamin C (ASCORBIC ACID) 500 MG tablet Take 1,000 mg by mouth daily.    No Known Allergies   Past Medical History:  Diagnosis Date  . Anemia   . Arthritis    stenosis - back, neck  . Cancer [facility])    renal neoplasm  . Chronic kidney disease    renalCa- nephrectomy [facility]) 04/2012  . Coronary artery disease   . GERD (gastroesophageal reflux disease)   . Hard of hearing   . Hearing deficit    hearing aids- bilateral  . Hypertension    sees Dr. Wynonia Lawman, stress test recently for prep for surgery  .  Hypothyroidism   . Lung cancer ([place])    right lower lobe nodule suspicious for metastatic clear cell renal cell neoplasm  . Prediabetes   . Vitamin D deficiency    Review of Systems  10 point systems review negative except as above.    Objective:   Physical Exam  BP 136/60   Pulse 80   Temp 97.3 F (36.3 C)   Resp 16   Overfed and morbidly obese, in wheel and in no distress. Hearing impaired despite amplification.   HEENT - Eac's patent. TM's Nl. EOM's full. PERRLA. NasoOroPharynx clear. Neck - supple. Nl Thyroid. Carotids 2+ & No bruits, nodes, JVD Chest - Clear equal BS. Cor - Nl HS. RRR w/o sig MGR. PP obscured by 2-3+ dependent edema. MS- FROM. Muscle power, tone and bulk decreased.  Neuro - No obvious Cr N abnormalities. Nl w/o focal abnormalities. Skin - exposed  w/o cyanosis, icterus or rash with brawny pachydermia of the distal lower extremities with chronic venous stasis changes and hemosiderin pigmentation. Both  LE wrapped in [place]orf boot(s).     Assessment & Plan:   1. Essential hypertension   2. CKD, stage IV (GFR 28 ml/min) (HCC)  - BASIC METABOLIC PANEL WITH GFR  3. Medication management  - CBC with Differential/Platelet - BASIC METABOLIC PANEL WITH GFR

## 2016-10-22 ENCOUNTER — Ambulatory Visit (INDEPENDENT_AMBULATORY_CARE_PROVIDER_SITE_OTHER): Payer: Medicare Other | Admitting: Podiatry

## 2016-10-22 DIAGNOSIS — I83005 Varicose veins of unspecified lower extremity with ulcer other part of foot: Secondary | ICD-10-CM

## 2016-10-22 DIAGNOSIS — L97502 Non-pressure chronic ulcer of other part of unspecified foot with fat layer exposed: Secondary | ICD-10-CM | POA: Diagnosis not present

## 2016-10-22 DIAGNOSIS — Z9889 Other specified postprocedural states: Secondary | ICD-10-CM

## 2016-10-22 DIAGNOSIS — I83015 Varicose veins of right lower extremity with ulcer other part of foot: Secondary | ICD-10-CM | POA: Diagnosis not present

## 2016-10-22 DIAGNOSIS — L97312 Non-pressure chronic ulcer of right ankle with fat layer exposed: Secondary | ICD-10-CM | POA: Diagnosis not present

## 2016-10-22 DIAGNOSIS — T8131XD Disruption of external operation (surgical) wound, not elsewhere classified, subsequent encounter: Secondary | ICD-10-CM | POA: Diagnosis not present

## 2016-10-22 DIAGNOSIS — L97519 Non-pressure chronic ulcer of other part of right foot with unspecified severity: Secondary | ICD-10-CM

## 2016-10-26 NOTE — Progress Notes (Addendum)
Patient ID: [name], male   DOB: Mar 27, 1929, 82 y.o.   MRN: [id number] Subjective: Patient presents today status post osteomyelitis resection with cellulitis to the right foot. Date of surgery was 09/08/2016. Patient states that he has minimal pain and that he is feeling well.  Objective: Today dressings were removed and changed. There is minimal drainage noted within the incision site.  Dehiscence of the incision site is noted with exposure of the underlying tissues. Dehiscence site measures 3.02.51.5cm (LxWxD).  To the dehiscence site ulceration, there is no eschar, there is a moderate amount of slough fibrin and necrotic tissue noted. Granulation tissue is red. Wound base is red. There is a moderate amount of serous drainage noted. Periwound integrity is intact. Ulceration noted to the right ankle overlying the lateral fibular malleolus measuring 1.0 cm 1.0 cm 0.1 cm. To the noted ulceration there is no eschar, there is a minimal amount of slough fibrin and necrotic tissue noted. There is no exposed bone muscle-tendon ligament or joint. There is a minimal amount of serosanguineous drainage noted. Granulation tissue and wound base is pink. Periwound integrity is intact.  Assessment:  #1 status post osteomyelitis resection with cellulitis to the right foot with placement of antibiotic beads-first MPJ. #2 dehiscence site first MPJ right foot  #3 ulceration lateral ankle right secondary to venous insufficiency  Plan of care: #1 today patient was evaluated Dressings were changed. #2 today we are going to modify dressing change orders. Orders for wound vac application 3 times per week to right foot surgical dehiscence site.  #3 medically necessary excisional debridement including subcutaneous tissue was performed to the lateral ankle ulceration using a tissue nipper. Excisional debridement of all necrotic nonviable tissue down to healthy viable tissue was performed with post-debridement  measurements same as pre-. #4 postop shoe dispensed today #5 return to clinic in 2 weeks

## 2016-10-27 ENCOUNTER — Telehealth: Payer: Self-pay | Admitting: Podiatry

## 2016-10-27 NOTE — Telephone Encounter (Signed)
[name] with [facility] called wanting clarification on the wound vac orders on [name]. You can reach her at [phone] ext. [phone].

## 2016-10-27 NOTE — Telephone Encounter (Addendum)
-----   Message from Edrick Kins, DPM sent at 10/26/2016  3:20 PM EST ----- Regarding: Home health wound care modification orders - placement of portable wound vac Please modify home health dressing change orders. Patient will need a portable wound vac. 3x/week x 6 weeks.   Dx : surgical dehiscence site right lower extremity. Right lateral ankle ulceration secondary to venous insufficiency and pressure  Orders :  #1 no changes to ankle ulceration dressing changes #2 application of wound VAC to surgical dehiscence site 3x/week x 6 weeks.   Thank you, Dr. Amalia Hailey. 10/27/2016-Faxed Required form, LOV and demographics to [facility]. 11/05/2016-Linda - [facility] states pt was in a panic yesterday and would not allow her to apply the wound vac, but did allow her to redress the foot. I informed Vaughan Basta, the son called yesterday and stated the wound had an odor and was offered an appt for pt 2:45pm yesterday and refused due to pt taking his water pill. Vaughan Basta offered to contact pt's dtr, Juliann Pulse and have her get pt in asap. I spoke with Juliann Pulse and she states pt has an appt today.

## 2016-11-02 ENCOUNTER — Other Ambulatory Visit: Payer: Self-pay | Admitting: Internal Medicine

## 2016-11-04 ENCOUNTER — Telehealth: Payer: Self-pay | Admitting: Podiatry

## 2016-11-04 ENCOUNTER — Encounter: Payer: Medicare Other | Admitting: Podiatry

## 2016-11-04 NOTE — Telephone Encounter (Signed)
I spoke to pts. Son  When they unwrapped wound it has an odor to it. They were offered an appt to 2:45pm today to see Dr. Jacqualyn Posey. Family declined will wait on cancellation for later In the week.  Son said his father had just taken his water pill.

## 2016-11-05 ENCOUNTER — Ambulatory Visit (INDEPENDENT_AMBULATORY_CARE_PROVIDER_SITE_OTHER): Payer: Medicare Other | Admitting: Podiatry

## 2016-11-05 ENCOUNTER — Encounter: Payer: Self-pay | Admitting: Podiatry

## 2016-11-05 DIAGNOSIS — I83015 Varicose veins of right lower extremity with ulcer other part of foot: Secondary | ICD-10-CM

## 2016-11-05 DIAGNOSIS — L03115 Cellulitis of right lower limb: Secondary | ICD-10-CM

## 2016-11-05 DIAGNOSIS — T8131XD Disruption of external operation (surgical) wound, not elsewhere classified, subsequent encounter: Secondary | ICD-10-CM | POA: Diagnosis not present

## 2016-11-05 DIAGNOSIS — L97312 Non-pressure chronic ulcer of right ankle with fat layer exposed: Secondary | ICD-10-CM

## 2016-11-05 DIAGNOSIS — L97519 Non-pressure chronic ulcer of other part of right foot with unspecified severity: Secondary | ICD-10-CM

## 2016-11-05 DIAGNOSIS — L97513 Non-pressure chronic ulcer of other part of right foot with necrosis of muscle: Secondary | ICD-10-CM

## 2016-11-05 MED ORDER — SULFAMETHOXAZOLE-TRIMETHOPRIM 800-160 MG PO TABS
1.0000 | ORAL_TABLET | Freq: Two times a day (BID) | ORAL | 0 refills | Status: DC
Start: 2016-11-05 — End: 2016-12-03

## 2016-11-06 ENCOUNTER — Telehealth: Payer: Self-pay | Admitting: *Deleted

## 2016-11-06 ENCOUNTER — Other Ambulatory Visit: Payer: Self-pay | Admitting: Podiatry

## 2016-11-06 DIAGNOSIS — T8131XD Disruption of external operation (surgical) wound, not elsewhere classified, subsequent encounter: Secondary | ICD-10-CM

## 2016-11-06 DIAGNOSIS — I83015 Varicose veins of right lower extremity with ulcer other part of foot: Secondary | ICD-10-CM

## 2016-11-06 DIAGNOSIS — L03119 Cellulitis of unspecified part of limb: Secondary | ICD-10-CM

## 2016-11-06 DIAGNOSIS — L97513 Non-pressure chronic ulcer of other part of right foot with necrosis of muscle: Secondary | ICD-10-CM

## 2016-11-06 DIAGNOSIS — L97519 Non-pressure chronic ulcer of other part of right foot with unspecified severity: Principal | ICD-10-CM

## 2016-11-06 MED ORDER — DOXYCYCLINE HYCLATE 100 MG PO TABS: 100.0000 mg | ORAL_TABLET | Freq: Two times a day (BID) | ORAL | 0 refills | Status: DC

## 2016-11-06 NOTE — Telephone Encounter (Addendum)
Verlin Grills asked if the Bactrim DS was safe for pt, pt only has one kidney. Vaughan Basta - [facility] states their protocol states not to put wound vac on a actively infected wound, that they need to put it on once the infection has resolved or if necessary after several days of the antibiotics. I told Heide Spark that I would inform Dr. Amalia Hailey of the protocol and to continue wound care as previously until Dr. Amalia Hailey instructed. 11/07/2016-Left message for pt's dtr, Juliann Pulse that Dr. Amalia Hailey had change the antibiotic and to discontinue the Bactrim DS, and it would be called to the [facility]. I informed Verlin Grills of Dr. Amalia Hailey orders and Shanon Brow states Dr. Amalia Hailey had called and they were going to pick it up now. 11/25/2016-Male caller states changed tomorrow appt to Monday, and if the home health nurse is not able to come tomorrow, can the wound vac be changed Friday. 01/01/2017-Orders of 12/31/2016 1:10pm faxed to [facility]. Consultation referral faxed to [facility].04/02/2017-Vanessa - [facility] states pt is healed and would like to make sure pt has a follow up appt. Lorriane Shire states [facility] will see pt once a week for the next two weeks beginning next week to make sure pt is transitioning well. Left message on pt's home phone reminding of 04/08/2017 appt.

## 2016-11-06 NOTE — Progress Notes (Signed)
Patient has a message today concerning Bactrim DS which was prescribed earlier this week. Patient states that he only has one kidney which is not functioning very well and has renal impairment. Patient is concerned regarding the Bactrim warnings.  Today and instructions were provided to discontinue Bactrim DS. A new prescription for doxycycline 100 mg twice a day 2 weeks.  Edrick Kins, DPM [facility]  Dr. Edrick Kins, [address]                                        [address] [place], [place] [address]                Office [phone]  Fax [phone]

## 2016-11-06 NOTE — Telephone Encounter (Signed)
I don't know what protocol you are referring to, but Wound Vac can be used on actively infected wounds, with exception of untreated osteomyelitis. Please apply wound vac with dressing changes every other day.  Next, yes, patient can take Bactrim DS. Until culture results come back, patient needs to be on Bactrim DS.   Thanks, Dr. Amalia Hailey.

## 2016-11-07 NOTE — Telephone Encounter (Signed)
-----   Message from Edrick Kins, DPM sent at 11/06/2016  6:10 PM EST ----- Regarding: Change abx Sorry, just to be safe we better prescribe Doxycycline'100mg'$  for patient. Discontinue BactrimDS. I just looked it up and there is no renal dosing adjustment for Doxy.   Thanks, Dr. Amalia Hailey

## 2016-11-09 NOTE — Progress Notes (Signed)
Patient ID: [name], male   DOB: 1929/01/05, 82 y.o.   MRN: [id number] Subjective: Patient presents today status post osteomyelitis resection with cellulitis to the right foot. Date of surgery was 09/08/2016. Patient presents today with concern for acute changes to the surgical site. The wound has dehisced and has been managed currently however he is notices significant significant increase in drainage with erythema surrounding the surgical site.  Objective: Today dressings were removed and changed. There is an excessive amount of drainage noted within the incision site.  Dehiscence of the incision site is noted with exposure of the underlying tissues including muscle and deep fascial compartments. Dehiscence site measures 3.02.51.5cm (LxWxD).  To the dehiscence site ulceration, there is no eschar, there is an extensive amount of slough fibrin and necrotic tissue noted. Granulation tissue is red. Wound base is red. There is a moderate amount of serous drainage noted. Periwound integrity is intact. Ulceration noted to the right ankle overlying the lateral fibular malleolus measuring 1.0 cm 1.0 cm 0.1 cm. To the noted ulceration there is no eschar, there is a minimal amount of slough fibrin and necrotic tissue noted. There is no exposed bone muscle-tendon ligament or joint. There is a minimal amount of serosanguineous drainage noted. Granulation tissue and wound base is pink. Periwound integrity is intact.  Assessment:  #1 status post osteomyelitis resection with cellulitis to the right foot with placement of antibiotic beads-first MPJ. #2 dehiscence site first MPJ right foot  #3 cellulitis right foot  #4 ulceration lateral ankle right secondary to venous insufficiency and pressure  Plan of care: #1 today patient was evaluated Dressings were changed. #2 today we are going to modify dressing change orders. Orders for wound vac application 3 times per week to right foot surgical dehiscence  site.  #3 medically necessary excisional debridement including muscle and deep fascial tissue was performed to the surgical wound dehiscence site using a tissue nipper. Excisional debridement of all necrotic nonviable tissue down to healthy viable tissue was performed with post-debridement measurements same as pre-. #4 prescription for doxycycline 100 mg #5 today new cultures were taken  #6 return to clinic on next scheduled appointment

## 2016-11-12 ENCOUNTER — Ambulatory Visit: Payer: Medicare Other | Admitting: Podiatry

## 2016-11-12 ENCOUNTER — Ambulatory Visit (INDEPENDENT_AMBULATORY_CARE_PROVIDER_SITE_OTHER): Payer: Medicare Other | Admitting: Podiatry

## 2016-11-12 DIAGNOSIS — L97312 Non-pressure chronic ulcer of right ankle with fat layer exposed: Secondary | ICD-10-CM

## 2016-11-12 DIAGNOSIS — L97513 Non-pressure chronic ulcer of other part of right foot with necrosis of muscle: Secondary | ICD-10-CM | POA: Diagnosis not present

## 2016-11-12 DIAGNOSIS — L03115 Cellulitis of right lower limb: Secondary | ICD-10-CM

## 2016-11-12 DIAGNOSIS — L97519 Non-pressure chronic ulcer of other part of right foot with unspecified severity: Secondary | ICD-10-CM

## 2016-11-12 DIAGNOSIS — T8131XD Disruption of external operation (surgical) wound, not elsewhere classified, subsequent encounter: Secondary | ICD-10-CM

## 2016-11-12 DIAGNOSIS — I83015 Varicose veins of right lower extremity with ulcer other part of foot: Secondary | ICD-10-CM | POA: Diagnosis not present

## 2016-11-18 DIAGNOSIS — I2510 Atherosclerotic heart disease of native coronary artery without angina pectoris: Secondary | ICD-10-CM | POA: Diagnosis not present

## 2016-11-18 DIAGNOSIS — R7303 Prediabetes: Secondary | ICD-10-CM | POA: Diagnosis not present

## 2016-11-18 DIAGNOSIS — Z4789 Encounter for other orthopedic aftercare: Secondary | ICD-10-CM | POA: Diagnosis not present

## 2016-11-18 DIAGNOSIS — N184 Chronic kidney disease, stage 4 (severe): Secondary | ICD-10-CM

## 2016-11-18 DIAGNOSIS — M86171 Other acute osteomyelitis, right ankle and foot: Secondary | ICD-10-CM | POA: Diagnosis not present

## 2016-11-18 DIAGNOSIS — I129 Hypertensive chronic kidney disease with stage 1 through stage 4 chronic kidney disease, or unspecified chronic kidney disease: Secondary | ICD-10-CM

## 2016-11-23 NOTE — Progress Notes (Signed)
Patient ID: [name], male   DOB: [birth year], 83 y.o.   MRN: [id number] Subjective: Patient presents today status post osteomyelitis resection with cellulitis to the right foot. Date of surgery was 09/08/2016. Patient presents today with concern for acute changes to the surgical site. The wound has dehisced and has been managed currently however he is notices significant significant increase in drainage with erythema surrounding the surgical site.  Objective: Today dressings were removed and changed. There is an excessive amount of drainage noted within the incision site.  Dehiscence of the incision site is noted with exposure of the underlying tissues including muscle and deep fascial compartments. Dehiscence site measures 3.02.51.5cm (LxWxD).  To the dehiscence site ulceration, there is no eschar, there is an extensive amount of slough fibrin and necrotic tissue noted. Granulation tissue is red. Wound base is red. There is a moderate amount of serous drainage noted. Periwound integrity is intact. Ulceration noted to the right ankle overlying the lateral fibular malleolus measuring 1.0 cm 1.0 cm 0.1 cm. To the noted ulceration there is no eschar, there is a minimal amount of slough fibrin and necrotic tissue noted. There is no exposed bone muscle-tendon ligament or joint. There is a minimal amount of serosanguineous drainage noted. Granulation tissue and wound base is pink. Periwound integrity is intact.  Assessment:  #1 status post osteomyelitis resection with cellulitis to the right foot with placement of antibiotic beads-first MPJ. #2 dehiscence site first MPJ right foot  #3 cellulitis right foot  #4 ulceration lateral ankle right secondary to venous insufficiency and pressure  Plan of care: #1 today patient was evaluated #2 medically necessary excisional debridement including muscle and deep fascial layers was performed to the ulceration sites using a curet. Excisional debridement of  all necrotic nonviable tissue down to healthy bleeding viable tissue was performed with post-debridement measurements same as pre-. negative pressure wound VAC therapy was applied today to the first MPJ ulceration right foot. #3 continue doxycycline #4 return to clinic in 2 weeks

## 2016-11-25 NOTE — Telephone Encounter (Signed)
Yes, that's fine.  Dr. Amalia Hailey

## 2016-11-26 ENCOUNTER — Ambulatory Visit: Payer: Self-pay | Admitting: Physician Assistant

## 2016-11-26 ENCOUNTER — Ambulatory Visit: Payer: Medicare Other | Admitting: Podiatry

## 2016-12-01 ENCOUNTER — Ambulatory Visit (INDEPENDENT_AMBULATORY_CARE_PROVIDER_SITE_OTHER): Payer: Medicare Other | Admitting: Podiatry

## 2016-12-01 DIAGNOSIS — I83005 Varicose veins of unspecified lower extremity with ulcer other part of foot: Secondary | ICD-10-CM | POA: Diagnosis not present

## 2016-12-01 DIAGNOSIS — L97519 Non-pressure chronic ulcer of other part of right foot with unspecified severity: Secondary | ICD-10-CM

## 2016-12-01 DIAGNOSIS — L97513 Non-pressure chronic ulcer of other part of right foot with necrosis of muscle: Secondary | ICD-10-CM

## 2016-12-01 DIAGNOSIS — L97502 Non-pressure chronic ulcer of other part of unspecified foot with fat layer exposed: Secondary | ICD-10-CM

## 2016-12-01 DIAGNOSIS — L97312 Non-pressure chronic ulcer of right ankle with fat layer exposed: Secondary | ICD-10-CM | POA: Diagnosis not present

## 2016-12-01 DIAGNOSIS — T8131XD Disruption of external operation (surgical) wound, not elsewhere classified, subsequent encounter: Secondary | ICD-10-CM

## 2016-12-01 DIAGNOSIS — I83015 Varicose veins of right lower extremity with ulcer other part of foot: Secondary | ICD-10-CM

## 2016-12-01 MED ORDER — DOXYCYCLINE HYCLATE 100 MG PO TABS: 100.0000 mg | ORAL_TABLET | Freq: Two times a day (BID) | ORAL | 0 refills | Status: DC

## 2016-12-03 ENCOUNTER — Encounter: Payer: Self-pay | Admitting: Physician Assistant

## 2016-12-03 ENCOUNTER — Ambulatory Visit (INDEPENDENT_AMBULATORY_CARE_PROVIDER_SITE_OTHER): Payer: Medicare Other | Admitting: Physician Assistant

## 2016-12-03 VITALS — BP 148/60 | HR 77 | Temp 97.9°F | Resp 16

## 2016-12-03 DIAGNOSIS — Z9119 Patient's noncompliance with other medical treatment and regimen: Secondary | ICD-10-CM

## 2016-12-03 DIAGNOSIS — E034 Atrophy of thyroid (acquired): Secondary | ICD-10-CM | POA: Diagnosis not present

## 2016-12-03 DIAGNOSIS — R7309 Other abnormal glucose: Secondary | ICD-10-CM

## 2016-12-03 DIAGNOSIS — N184 Chronic kidney disease, stage 4 (severe): Secondary | ICD-10-CM | POA: Diagnosis not present

## 2016-12-03 DIAGNOSIS — Z79899 Other long term (current) drug therapy: Secondary | ICD-10-CM

## 2016-12-03 DIAGNOSIS — G894 Chronic pain syndrome: Secondary | ICD-10-CM

## 2016-12-03 DIAGNOSIS — I2510 Atherosclerotic heart disease of native coronary artery without angina pectoris: Secondary | ICD-10-CM

## 2016-12-03 DIAGNOSIS — E559 Vitamin D deficiency, unspecified: Secondary | ICD-10-CM

## 2016-12-03 DIAGNOSIS — C7801 Secondary malignant neoplasm of right lung: Secondary | ICD-10-CM

## 2016-12-03 DIAGNOSIS — I10 Essential (primary) hypertension: Secondary | ICD-10-CM

## 2016-12-03 DIAGNOSIS — K219 Gastro-esophageal reflux disease without esophagitis: Secondary | ICD-10-CM

## 2016-12-03 DIAGNOSIS — E782 Mixed hyperlipidemia: Secondary | ICD-10-CM

## 2016-12-03 DIAGNOSIS — Z0001 Encounter for general adult medical examination with abnormal findings: Secondary | ICD-10-CM | POA: Diagnosis not present

## 2016-12-03 DIAGNOSIS — Z0000 Encounter for general adult medical examination without abnormal findings: Secondary | ICD-10-CM

## 2016-12-03 DIAGNOSIS — I700 Atherosclerosis of aorta: Secondary | ICD-10-CM | POA: Diagnosis not present

## 2016-12-03 DIAGNOSIS — M4800 Spinal stenosis, site unspecified: Secondary | ICD-10-CM

## 2016-12-03 DIAGNOSIS — C642 Malignant neoplasm of left kidney, except renal pelvis: Secondary | ICD-10-CM

## 2016-12-03 DIAGNOSIS — M869 Osteomyelitis, unspecified: Secondary | ICD-10-CM

## 2016-12-03 DIAGNOSIS — Z91199 Patient's noncompliance with other medical treatment and regimen due to unspecified reason: Secondary | ICD-10-CM

## 2016-12-03 DIAGNOSIS — R6889 Other general symptoms and signs: Secondary | ICD-10-CM | POA: Diagnosis not present

## 2016-12-03 DIAGNOSIS — L97513 Non-pressure chronic ulcer of other part of right foot with necrosis of muscle: Secondary | ICD-10-CM

## 2016-12-03 DIAGNOSIS — M159 Polyosteoarthritis, unspecified: Secondary | ICD-10-CM

## 2016-12-03 DIAGNOSIS — D508 Other iron deficiency anemias: Secondary | ICD-10-CM

## 2016-12-03 DIAGNOSIS — M1000 Idiopathic gout, unspecified site: Secondary | ICD-10-CM

## 2016-12-03 LAB — CBC WITH DIFFERENTIAL/PLATELET
BASOS ABS: 80 {cells}/uL (ref 0–200)
BASOS PCT: 1 %
Eosinophils Absolute: 480 cells/uL (ref 15–500)
Eosinophils Relative: 6 %
HCT: 30.4 % — ABNORMAL LOW (ref 38.5–50.0)
HEMOGLOBIN: 9.5 g/dL — AB (ref 13.2–17.1)
Lymphocytes Relative: 22 %
Lymphs Abs: 1760 cells/uL (ref 850–3900)
MCH: 30.2 pg (ref 27.0–33.0)
MCHC: 31.3 g/dL — ABNORMAL LOW (ref 32.0–36.0)
MCV: 96.5 fL (ref 80.0–100.0)
MPV: 9.5 fL (ref 7.5–12.5)
Monocytes Absolute: 640 cells/uL (ref 200–950)
Monocytes Relative: 8 %
NEUTROS ABS: 5040 {cells}/uL (ref 1500–7800)
Neutrophils Relative %: 63 %
Platelets: 221 10*3/uL (ref 140–400)
RBC: 3.15 MIL/uL — ABNORMAL LOW (ref 4.20–5.80)
RDW: 15.4 % — ABNORMAL HIGH (ref 11.0–15.0)
WBC: 8 10*3/uL (ref 3.8–10.8)

## 2016-12-03 LAB — BASIC METABOLIC PANEL WITH GFR
BUN: 42 mg/dL — AB (ref 7–25)
CHLORIDE: 101 mmol/L (ref 98–110)
CO2: 24 mmol/L (ref 20–31)
CREATININE: 2.17 mg/dL — AB (ref 0.70–1.11)
Calcium: 8.9 mg/dL (ref 8.6–10.3)
GFR, Est African American: 31 mL/min — ABNORMAL LOW (ref >=60)
GFR, Est Non African American: 27 mL/min — ABNORMAL LOW (ref >=60)
Glucose, Bld: 139 mg/dL — ABNORMAL HIGH (ref 65–99)
POTASSIUM: 4 mmol/L (ref 3.5–5.3)
Sodium: 137 mmol/L (ref 135–146)

## 2016-12-03 LAB — TSH: TSH: 0.34 m[IU]/L — AB (ref 0.40–4.50)

## 2016-12-03 LAB — HEPATIC FUNCTION PANEL
ALBUMIN: 3 g/dL — AB (ref 3.6–5.1)
ALK PHOS: 79 U/L (ref 40–115)
ALT: 9 U/L (ref 9–46)
AST: 15 U/L (ref 10–35)
BILIRUBIN TOTAL: 0.3 mg/dL (ref 0.2–1.2)
Bilirubin, Direct: 0.1 mg/dL (ref <=0.2)
Indirect Bilirubin: 0.2 mg/dL (ref 0.2–1.2)
Total Protein: 6.6 g/dL (ref 6.1–8.1)

## 2016-12-03 LAB — LIPID PANEL
CHOL/HDL RATIO: 4.2 ratio (ref <5.0)
Cholesterol: 143 mg/dL (ref <200)
HDL: 34 mg/dL — AB (ref >40)
LDL CALC: 88 mg/dL (ref <100)
Triglycerides: 106 mg/dL (ref <150)
VLDL: 21 mg/dL (ref <30)

## 2016-12-03 LAB — MAGNESIUM: MAGNESIUM: 2.4 mg/dL (ref 1.5–2.5)

## 2016-12-03 NOTE — Progress Notes (Signed)
MEDICARE ANNUAL WELLNESS VISIT AND FOLLOW UP Assessment:    Coronary artery disease involving native coronary artery of native heart without angina pectoris Continue follow up Dr. Wynonia Lawman, he has NTG but does not use likely due to inactivity.    Essential hypertension - continue medications, DASH diet, exercise and monitor at home. Call if greater than 130/80.  - CBC with Differential - BASIC METABOLIC PANEL WITH GFR - Hepatic function panel   Gastroesophageal reflux disease without esophagitis controlled   Hypothyroidism, unspecified hypothyroidism type - TSH   Prediabetes Discussed general issues about diabetes pathophysiology and management., Educational material distributed., Suggested low cholesterol diet., Encouraged aerobic exercise., Discussed foot care., Reminded to get yearly retinal exam. - Hemoglobin A1c   DJD Long discussion- patient is unable to ambulate/move due to pain- he is on tramadol, we discussed switching to norco or sending to pain management Declines PT  Cancer of kidney, left Continue follow up - BASIC METABOLIC PANEL WITH GFR   Chronic kidney disease, stage 4 (severe) Continue follow up  - BASIC METABOLIC PANEL WITH GFR  Anemia, unspecified anemia type Likely due to CKD - CBC with Differential   Encounter for long-term (current) use of other medications - Magnesium   Hyperlipidemia -continue medications, check lipids, decrease fatty foods, increase activity. ; - Lipid panel  Morbid Obesity (BMI 30-39.9) Obesity with co morbidities- long discussion about weight loss, diet, and exercise   Spinal stenosis, unspecified spinal region ? Need follow up ortho/pain management  Vitamin D deficiency - Vit D  25 hydroxy (rtn osteoporosis monitoring)  Gout without tophus, unspecified cause, unspecified chronicity, unspecified site - Uric acid  Medicare annual wellness visit, subsequent  Morbid Obesity with co morbidities - long discussion about  weight loss, diet, and exercise  Atherosclerosis of aorta (HCC) Control blood pressure, cholesterol, glucose, increase exercise.   Solitary Right Lower Lung Metastasis from Renal Cell Carcinoma Continue follow up  Gastroesophageal reflux disease, esophagitis presence not specified Continue PPI/H2 blocker, diet discussed  Hypothyroidism due to acquired atrophy of thyroid Hypothyroidism-check TSH level, continue medications the same, reminded to take on an empty stomach 30-27mns before food.   Osteomyelitis of right lower extremity ([facility]: Right great toe Continue wound management  Idiopathic gout, unspecified chronicity, unspecified site Gout- recheck Uric acid as needed, Diet discussed, continue medications.  Chronic pain syndrome Continue follow up  Iron deficiency anemia secondary to inadequate dietary iron intake - monitor, continue iron supp with Vitamin C and increase green leafy veggies  Non compliance with medical treatment Noncompliance- emphasized compliance with medications and appointments, patient expressed understanding  Skin ulcer of right foot with necrosis of muscle ([place] Continue wound management   Future Appointments Date Time Provider [place] 12/18/2016 2:45 PM BEdrick Kins DPM [facility]  12/25/2016 2:45 PM BEdrick Kins DPM [facility]  12/29/2016 11:30 AM WUnk Pinto MD [facility] None  01/21/2017 8:30 AM [facility] 2 [facility] None  01/29/2017 3:30 PM [facility]yatt Portela [facility]     Plan:   During the course of the visit the patient was educated and counseled about appropriate screening and preventive services including:    Pneumococcal vaccine   Influenza vaccine  Td vaccine  Screening electrocardiogram  Colorectal cancer screening  Diabetes screening  Glaucoma screening  Nutrition counseling     Subjective:  Chad CAGGIANOis a 83y.o. male who presents for Medicare Annual  Wellness Visit and 3 month follow up for HTN, hyperlipidemia,  prediabetes, and vitamin D Def.   His blood pressure has been controlled at home, today their BP is BP: (!) 148/60 He does not workout. He denies chest pain, shortness of breath, dizziness likely due to inactivity.  He has history of PTCA and follows with Dr. Wynonia Lawman. He had cellulitis/osteomylitis of right foot, on doxycycline, and had surgery in Oct, now has wound vac on it and on doxycycline, starting to heal better. He is on bumex once daily, he is off the losartan still.  Lab Results  Component Value Date   CREATININE 2.41 (H) 10/14/2016   BUN 41 (H) 10/14/2016   NA 135 10/14/2016   K 3.9 10/14/2016   CL 98 10/14/2016   CO2 27 10/14/2016    He is on cholesterol medication and denies myalgias. His cholesterol is at goal. The cholesterol last visit was:   Lab Results  Component Value Date   CHOL 132 05/30/2016   HDL 38 (L) 05/30/2016   LDLCALC 81 05/30/2016   TRIG 63 05/30/2016   CHOLHDL 3.5 05/30/2016   He has been working on diet and exercise for prediabetes, and denies polydipsia and polyuria. Last A1C in the office was:  Lab Results  Component Value Date   HGBA1C 6.3 (H) 09/07/2016   Patient is on Vitamin D supplement.   Lab Results  Component Value Date   VD25OH 60 02/28/2016     Patient is on allopurinol for gout and does not report a recent flare.  He is on thyroid medication. His medication was not changed last visit. Patient denies nervousness, palpitations and weight changes.  Lab Results  Component Value Date   TSH 0.78 05/30/2016  .  He is in a wheelchair due to severe pain in his legs and is very inactive, he is on tramadol.  Follows oncology for renal cell carcinoma x 2013 s/p left nephrectomy, has documented pulmonary mets, has completed radiation.  BMI is There is no height or weight on file to calculate BMI., he is working on diet and exercise. Wt Readings from Last 3 Encounters:  09/16/16 257  lb (116.6 kg)  09/15/16 257 lb 15 oz (117 kg)  09/11/16 251 lb 9.6 oz (114.1 kg)     Names of Other Physician/Practitioners you currently use: 1. [facility] here for primary care 2. No dentist, has dentures.  Patient Care Team: Unk Pinto, MD as PCP - General (Internal Medicine) Jacolyn Reedy, MD as Consulting Physician (Cardiology) Rob Hickman, MD as Consulting Physician (Ophthalmology)- 1 year due Missy Sabins, MD as Consulting Physician (Gastroenterology) Ninetta Lights, MD as Consulting Physician (Orthopedic Surgery) Dr. Alen Blew- oncology  Medication Review: Current Outpatient Prescriptions on File Prior to Visit  Medication Sig Dispense Refill  . allopurinol (ZYLOPRIM) 300 MG tablet TAKE 1 TABLET DAILY 90 tablet 1  . amLODipine (NORVASC) 10 MG tablet Take 1 tablet (10 mg total) by mouth daily before breakfast. 30 tablet 0  . aspirin EC 81 MG tablet Take 81 mg by mouth daily.    . bumetanide (BUMEX) 2 MG tablet TAKE ONE-HALF (1/2) TO ONE TABLET DAILY FOR FLUID 90 tablet 2  . Cholecalciferol (VITAMIN D3) 1000 UNITS CAPS Take 2,000 capsules by mouth daily.     . diclofenac sodium (VOLTAREN) 1 % GEL Apply 4 g topically 4 (four) times daily. 100 g 3  . doxycycline (VIBRA-TABS) 100 MG tablet Take 1 tablet (100 mg total) by mouth 2 (two) times daily. 20 tablet  0  . FERROUS SULFATE PO Take 1 tablet by mouth 3 (three) times daily.     Marland Kitchen levothyroxine (SYNTHROID, LEVOTHROID) 150 MCG tablet Take 1 pill daily but take 1.5 pills on Sunday, take 1 hour before food, only with water. 100 tablet 1  . Magnesium 250 MG TABS Take 500 mg by mouth daily.     Marland Kitchen menthol-cetylpyridinium (CEPACOL) 3 MG lozenge Take 1 lozenge (3 mg total) by mouth as needed for sore throat. 100 tablet 12  . Multiple Vitamin (MULITIVITAMIN WITH MINERALS) TABS Take 1 tablet by mouth daily.    . nitroGLYCERIN (NITROSTAT) 0.4 MG SL tablet Place 0.4 mg under the tongue every  5 (five) minutes as needed for chest pain.     . pravastatin (PRAVACHOL) 40 MG tablet Take 1 tablet (40 mg total) by mouth at bedtime. 90 tablet 4  . sulfamethoxazole-trimethoprim (BACTRIM DS,SEPTRA DS) 800-160 MG tablet Take 1 tablet by mouth 2 (two) times daily. 28 tablet 0  . traMADol (ULTRAM) 50 MG tablet Take 1 tablet (50 mg total) by mouth 3 (three) times daily as needed for severe pain. 20 tablet 0  . vitamin C (ASCORBIC ACID) 500 MG tablet Take 1,000 mg by mouth daily.      No current facility-administered medications on file prior to visit.     Current Problems (verified) Patient Active Problem List   Diagnosis Date Noted  . Anemia 09/15/2016  . Chronic pain syndrome   . Essential hypertension   . Osteomyelitis of right lower extremity ([place]): Right great toe 09/11/2016  . Cellulitis of right foot 09/07/2016  . Acute osteomyelitis of right foot ([place]) 09/07/2016  . Foot ulcer ([place]) 09/07/2016  . 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 Atherosclerosis of aorta ([place]) 05/30/2016  . 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 Non compliance with medical treatment 08/20/2015  . Medicare annual wellness visit, subsequent 08/18/2015  . Gout 08/18/2015  . CAD (coronary artery disease), native coronary artery   . Obesity (BMI 30-39.9)   . Spinal stenosis   . Medication management 01/23/2014  . Hyperlipidemia 11/24/2013  . DJD   . Cancer of kidney ([place])   . GERD (gastroesophageal reflux disease)   . Hypertension   . CKD, stage IV (GFR 28 ml/min) (HCC)   . Hypothyroidism   . Vitamin D deficiency   . Abnormal blood sugar     Screening Tests Immunization History  Administered Date(s) Administered  . Influenza Split 09/06/2013  . Influenza, High Dose Seasonal PF 07/20/2014, 08/20/2015, 07/24/2016  . PPD Test 09/12/2016  . Pneumococcal Conjugate-13 10/30/2014  . Pneumococcal Polysaccharide-23  02/18/2012  . Td 02/18/2012  . Zoster 03/03/2013    Preventative care: Last colonoscopy: 2010 due 10/2014 but declines Cardiac Cath Date: 06/06/2008;  Stent Placement Date: 06/12/2008;  Holter/Event Monitor Date: 07/27/2008;  Nuclear Study Date: 04/05/2008;  Chest Xray Date: 06/02/2008   Prior vaccinations: TD or Tdap: 2013  Influenza: 2017 Pneumococcal: 2013 Prevnar 13: 2015 Shingles/Zostavax: 2014  Allergies No Known Allergies  SURGICAL HISTORY He  has a past surgical history that includes Joint replacement; Cholecystectomy; Tonsillectomy; Appendectomy; Eye surgery; Laparoscopic nephrectomy (04/29/2012); Coronary angioplasty; Back surgery; Anterior cervical decomp/discectomy fusion (N/A, 01/18/2013); I&D extremity (Right, 09/08/2016); Toe arthroplasty (Right, 09/08/2016); and Wound debridement (Right, 09/08/2016). FAMILY HISTORY His family history includes Cancer in his brother and sister. SOCIAL HISTORY He  reports that he quit smoking about 46 years ago.  His smoking use included Cigarettes. He has never used smokeless tobacco. He reports that he drinks about 6.6 oz of alcohol per week . He reports that he does not use drugs.  MEDICARE WELLNESS OBJECTIVES: Physical activity:   Cardiac risk factors:   Depression/mood screen:   Depression screen [facility] 2/9 09/22/2016  Decreased Interest 0  Down, Depressed, Hopeless 0  PHQ - 2 Score 0  Altered sleeping -  Tired, decreased energy -  Change in appetite -  Feeling bad or failure about yourself  -  Trouble concentrating -  Moving slowly or fidgety/restless -  Suicidal thoughts -  PHQ-9 Score -    ADLs:  In your present state of health, do you have any difficulty performing the following activities: 09/22/2016 09/07/2016  Hearing? [place]? - -  Difficulty concentrating or making decisions? N -  Walking or climbing stairs? N -  Dressing or bathing? Y -  Doing errands, shopping? Y Y  Some recent data might be hidden      Cognitive Testing  Alert? Yes  Normal Appearance?Yes  Oriented to person? Yes  Place? Yes   Time? Yes  Recall of three objects?  Yes  Can perform simple calculations? Yes  Displays appropriate judgment?Yes  Can read the correct time from a watch face?Yes  EOL planning: Does Patient Have a Medical Advance Directive?: Yes Type of Advance Directive: Healthcare Power of Attorney, Living will [place] in Chart?: No - copy requested   Review of Systems  Constitutional: Negative for chills, fever and malaise/fatigue.  HENT: Negative for congestion, ear pain and sore throat.   Eyes: Negative.   Respiratory: Negative for cough, sputum production, shortness of breath and wheezing.   Cardiovascular: Positive for leg swelling. Negative for chest pain and palpitations.  Gastrointestinal: Positive for constipation. Negative for blood in stool, diarrhea, heartburn, melena and nausea.  Genitourinary: Negative.   Musculoskeletal: Positive for joint pain. Negative for back pain, falls, myalgias and neck pain.  Skin: Positive for rash.  Neurological: Negative for dizziness, sensory change, loss of consciousness and headaches.  Psychiatric/Behavioral: Negative for depression. The patient is not nervous/anxious and does not have insomnia.       Objective:   Blood pressure (!) 148/60, pulse 77, temperature 97.9 F (36.6 C), resp. rate 16, SpO2 98 %. There is no height or weight on file to calculate BMI.  General appearance: alert, no distress, WD/WN, male HEENT: normocephalic, sclerae anicteric, TMs pearly, nares patent, no discharge or erythema, pharynx normal Oral cavity: MMM, no lesions Neck: supple, no lymphadenopathy, no thyromegaly, no masses Heart: RRR, distant heart sounds, normal S1, S2, 2/6 systolic murmur LSB Lungs: CTA bilaterally, no wheezes, rhonchi, or rales Abdomen: +bs, soft, obese non tender, non distended, no masses, no hepatomegaly, no  splenomegaly Musculoskeletal: patient has swelling bilateral knees, with tenderness.  Extremities: 1-2+  Edema, right foot with wound vac, left foot decreased DP and TP, . Decreased sensation bilateral feet to shins.  Pulses: 2+ symmetric, upper and absent DP and decrease TP Neurological: alert, oriented x 3, CN2-12 intact, strength normal upper extremities and decreased 3/5 lower extremities,DTRs 2+ throughout, no cerebellar signs, patient in wheel chair Psychiatric: normal affect, behavior normal, pleasant   Medicare Attestation I have personally reviewed: The patient's medical and social history Their use of alcohol, tobacco or illicit drugs Their current medications and supplements The patient's functional ability including ADLs,fall risks, home safety risks, cognitive, and hearing and visual  impairment Diet and physical activities Evidence for depression or mood disorders  The patient's weight, height, BMI, and visual acuity have been recorded in the chart.  I have made referrals, counseling, and provided education to the patient based on review of the above and I have provided the patient with a written personalized care plan for preventive services.     Vicie Mutters, PA-C   12/03/2016

## 2016-12-04 LAB — HEMOGLOBIN A1C
Hgb A1c MFr Bld: 5.8 % — ABNORMAL HIGH
Mean Plasma Glucose: 120 mg/dL

## 2016-12-07 NOTE — Progress Notes (Signed)
Patient ID: [name], male   DOB: 1929-07-09, 83 y.o.   MRN: [id number] Subjective: Patient presents today status post osteomyelitis resection with cellulitis to the right foot. Date of surgery was 09/08/2016. Patient presents today with concern for acute changes to the surgical site. The wound has dehisced and has been managed currently however he is notices significant significant increase in drainage with erythema surrounding the surgical site.  Objective: Today dressings were removed and changed. There is an moderate amount of drainage noted within the incision site.  Dehiscence of the incision site is noted with exposure of the underlying tissues including muscle and deep fascial compartments. Dehiscence site measures 3.02.51.5cm (LxWxD).  To the dehiscence site ulceration, there is no eschar, there is a moderate amount of slough fibrin and necrotic tissue noted. Granulation tissue is red. Wound base is red. There is a moderate amount of serous drainage noted. Periwound integrity is intact. Ulceration noted to the right ankle overlying the lateral fibular malleolus measuring 1.0 cm 1.0 cm 0.1 cm. To the noted ulceration there is no eschar, there is a minimal amount of slough fibrin and necrotic tissue noted. There is no exposed bone muscle-tendon ligament or joint. There is a minimal amount of serosanguineous drainage noted. Granulation tissue and wound base is pink. Periwound integrity is intact.  Assessment:  #1 status post osteomyelitis resection with cellulitis to the right foot with placement of antibiotic beads-first MPJ. #2 dehiscence site first MPJ right foot  #3 cellulitis right foot  #4 ulceration lateral ankle right secondary to venous insufficiency and pressure  Plan of care: #1 today patient was evaluated #2 medically necessary excisional debridement including muscle and deep fascial layers was performed to the ulceration sites using a curet and tissue nipper. Excisional  debridement of all necrotic nonviable tissue down to healthy bleeding viable tissue was performed with post-debridement measurements same as pre-. negative pressure wound VAC therapy was applied today to the first MPJ ulceration right foot. #3 application of negative pressure wound VAC therapy was applied today #4 prescription for doxycycline #5 return to clinic in 2 weeks

## 2016-12-09 ENCOUNTER — Encounter: Payer: Self-pay | Admitting: Physician Assistant

## 2016-12-17 ENCOUNTER — Ambulatory Visit (INDEPENDENT_AMBULATORY_CARE_PROVIDER_SITE_OTHER): Payer: Medicare Other | Admitting: Podiatry

## 2016-12-17 DIAGNOSIS — L97513 Non-pressure chronic ulcer of other part of right foot with necrosis of muscle: Secondary | ICD-10-CM

## 2016-12-17 DIAGNOSIS — L97519 Non-pressure chronic ulcer of other part of right foot with unspecified severity: Principal | ICD-10-CM

## 2016-12-17 DIAGNOSIS — I83015 Varicose veins of right lower extremity with ulcer other part of foot: Secondary | ICD-10-CM | POA: Diagnosis not present

## 2016-12-18 ENCOUNTER — Ambulatory Visit: Payer: Medicare Other | Admitting: Podiatry

## 2016-12-22 ENCOUNTER — Other Ambulatory Visit: Payer: Self-pay | Admitting: Internal Medicine

## 2016-12-25 ENCOUNTER — Ambulatory Visit: Payer: Medicare Other | Admitting: Podiatry

## 2016-12-29 ENCOUNTER — Ambulatory Visit (INDEPENDENT_AMBULATORY_CARE_PROVIDER_SITE_OTHER): Payer: Medicare Other | Admitting: Internal Medicine

## 2016-12-29 ENCOUNTER — Encounter: Payer: Self-pay | Admitting: Internal Medicine

## 2016-12-29 VITALS — BP 140/66 | HR 72 | Temp 97.5°F | Resp 16

## 2016-12-29 DIAGNOSIS — Z79899 Other long term (current) drug therapy: Secondary | ICD-10-CM

## 2016-12-29 DIAGNOSIS — E034 Atrophy of thyroid (acquired): Secondary | ICD-10-CM | POA: Diagnosis not present

## 2016-12-29 DIAGNOSIS — I10 Essential (primary) hypertension: Secondary | ICD-10-CM | POA: Diagnosis not present

## 2016-12-29 LAB — BASIC METABOLIC PANEL WITH GFR
BUN: 46 mg/dL — AB (ref 7–25)
CHLORIDE: 101 mmol/L (ref 98–110)
CO2: 26 mmol/L (ref 20–31)
CREATININE: 2.48 mg/dL — AB (ref 0.70–1.11)
Calcium: 9 mg/dL (ref 8.6–10.3)
GFR, EST AFRICAN AMERICAN: 26 mL/min — AB (ref >=60)
GFR, Est Non African American: 23 mL/min — ABNORMAL LOW (ref >=60)
Glucose, Bld: 114 mg/dL — ABNORMAL HIGH (ref 65–99)
POTASSIUM: 4.2 mmol/L (ref 3.5–5.3)
SODIUM: 137 mmol/L (ref 135–146)

## 2016-12-29 LAB — CBC WITH DIFFERENTIAL/PLATELET
BASOS PCT: 1 %
Basophils Absolute: 96 cells/uL (ref 0–200)
EOS PCT: 5 %
Eosinophils Absolute: 480 cells/uL (ref 15–500)
HCT: 32.6 % — ABNORMAL LOW (ref 38.5–50.0)
Hemoglobin: 10.2 g/dL — ABNORMAL LOW (ref 13.2–17.1)
LYMPHS PCT: 22 %
Lymphs Abs: 2112 cells/uL (ref 850–3900)
MCH: 29.8 pg (ref 27.0–33.0)
MCHC: 31.3 g/dL — ABNORMAL LOW (ref 32.0–36.0)
MCV: 95.3 fL (ref 80.0–100.0)
MONOS PCT: 10 %
MPV: 9.5 fL (ref 7.5–12.5)
Monocytes Absolute: 960 cells/uL — ABNORMAL HIGH (ref 200–950)
NEUTROS ABS: 5952 {cells}/uL (ref 1500–7800)
Neutrophils Relative %: 62 %
PLATELETS: 245 10*3/uL (ref 140–400)
RBC: 3.42 MIL/uL — ABNORMAL LOW (ref 4.20–5.80)
RDW: 15.7 % — AB (ref 11.0–15.0)
WBC: 9.6 10*3/uL (ref 3.8–10.8)

## 2016-12-29 NOTE — Progress Notes (Signed)
Subjective:    Patient ID: [name], male    DOB: 11-13-1928, 83 y.o.   MRN: [id number]  HPI  Patient is  being [facility] for slow healing of Osteomyelitis of the R foot on Doxycycline and on wound Vac being managed by Dr Daylene Katayama.     Thyroid dose was recently tapered for low TSH and he returns today for Recheck . He is still in a wheel chair. Patients multitude of other problems are reviewed and include HTN, HLD, ASHD, ASPVD, CKD4 and metastatic RCC to lung being monitored conservatively in consideration of his age   Medication Sig  . allopurinol  300 MG tablet TAKE 1 TABLET DAILY  . AmLODipine  10 MG tablet Take 1 tablet (10 mg total) by mouth daily before breakfast.  . aspirin EC 81 MG tablet Take 81 mg by mouth daily.  . bumetanide  2 MG tablet TAKE ONE-HALF (1/2) TO ONE TABLET DAILY FOR FLUID  . VITAMIN D 2000 units  Take by mouth.  . diclofenac (VOLTAREN) 1 % GEL Apply 4 g topically 4 (four) times daily.  Marland Kitchen doxycycline100 MG tablet Take 1 tablet (100 mg total) by mouth 2 (two) times daily.  Marland Kitchen FERROUS SULFATE PO Take 1 tablet by mouth 3 (three) times daily.   Marland Kitchen levothyroxine  150 MCG tablet Take 1 pill daily   . Magnesium 250 MG TABS Take 500 mg by mouth daily.   [name] WITH MINERALS Take 1 tablet by mouth daily.  Marland Kitchen NITROSTAT 0.4 MG SL tablet as needed for chest pain.   . pravastatin (PRAVACHOL) 40 MG tablet Take 1 tablet (40 mg total) by mouth at bedtime.  . traMADol (ULTRAM) 50 MG tablet Take 1 tab 3 times daily as needed for severe pain.  . vitamin C  500 MG tablet Take 1,000 mg by mouth daily.    No Known Allergies   Past Medical History:  Diagnosis Date  . Anemia   . Arthritis    stenosis - back, neck  . Cancer [facility])    renal neoplasm  . Chronic kidney disease    renalCa- nephrectomy [facility]) 04/2012  . Coronary artery disease   . GERD (gastroesophageal reflux disease)   . Hard of hearing   . Hearing deficit    hearing aids-  bilateral  . Hypertension    sees Dr. Wynonia Lawman, stress test recently for prep for surgery  . Hypothyroidism   . 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 Prediabetes   . Vitamin D deficiency    Past Surgical History:  Procedure Laterality Date  . ANTERIOR CERVICAL DECOMP/DISCECTOMY FUSION N/A 01/18/2013   Procedure: ANTERIOR CERVICAL DECOMPRESSION/DISCECTOMY FUSION 2 LEVELS;  Surgeon: Faythe Ghee, MD;  Location: [place] [facility];  Service: Neurosurgery;  Laterality: N/A;  . APPENDECTOMY    . BACK SURGERY     lower back surgery - 1990's  . CHOLECYSTECTOMY    . CORONARY ANGIOPLASTY     stents , + 7-34yr. ago  . EYE SURGERY     right eye cataract surgery   . I&D EXTREMITY Right 09/08/2016   Procedure: IRRIGATION AND DEBRIDEMENT RIGHT FOOT WITH ANTIBIOTIC BEADS;  Surgeon: BEdrick Kins DPM;  [place] [facility]  Service: Podiatry;  Laterality: Right;  . JOINT REPLACEMENT     bilateral knee replacements   . LAPAROSCOPIC NEPHRECTOMY  04/29/2012   Procedure:  LAPAROSCOPIC NEPHRECTOMY;  Surgeon: Dutch Gray, MD;  Location: [facility];  Service: Urology;  Laterality: Left;      . TOE ARTHROPLASTY Right 09/08/2016   Procedure: Arthroplasty of First MPJ RIGHT FOOT;  Surgeon: Edrick Kins, DPM;  Location: [place];  Service: Podiatry;  Laterality: Right;  . TONSILLECTOMY    . WOUND DEBRIDEMENT Right 09/08/2016   Procedure: DEBRIDEMENT RIGHT ANKLE WOUND;  Surgeon: Edrick Kins, DPM;  Location: [place];  Service: Podiatry;  Laterality: Right;   Review of Systems  10 point systems review negative except as above.    Objective:   Physical Exam  BP 140/66   Pulse 72   Temp 97.5 F (36.4 C)   Resp 16   Morbidly obese in Wheelchair. In no acute Distress.  HEENT - Eac's patent. TM's Nl. EOM's full. PERRLA. NasoOroPharynx clear. Neck - supple. Nl Thyroid. Carotids 2+ & No bruits, nodes, JVD Chest - Clear. Cor - Nl HS. RRR w/o sig MGR.  1-2(+) edema. Abd - Soft, Obese. MS- Generalized decrease in muscle power, tone & bulk. Neuro - No obvious Cr N abnormalities.  Nl w/o focal abnormalities.    Assessment & Plan:   1. Essential hypertension   2. Hypothyroidism  - TSH  3. Medication management  - CBC with Differential/Platelet - BASIC METABOLIC PANEL WITH GFR - TSH

## 2016-12-30 LAB — TSH: TSH: 0.8 m[IU]/L (ref 0.40–4.50)

## 2016-12-31 ENCOUNTER — Encounter: Payer: Self-pay | Admitting: Podiatry

## 2016-12-31 ENCOUNTER — Ambulatory Visit (INDEPENDENT_AMBULATORY_CARE_PROVIDER_SITE_OTHER): Payer: Medicare Other | Admitting: Podiatry

## 2016-12-31 DIAGNOSIS — L97519 Non-pressure chronic ulcer of other part of right foot with unspecified severity: Principal | ICD-10-CM

## 2016-12-31 DIAGNOSIS — T8131XD Disruption of external operation (surgical) wound, not elsewhere classified, subsequent encounter: Secondary | ICD-10-CM

## 2016-12-31 DIAGNOSIS — I83015 Varicose veins of right lower extremity with ulcer other part of foot: Secondary | ICD-10-CM | POA: Diagnosis not present

## 2016-12-31 DIAGNOSIS — L97513 Non-pressure chronic ulcer of other part of right foot with necrosis of muscle: Secondary | ICD-10-CM

## 2016-12-31 NOTE — Progress Notes (Signed)
Patient ID: [name], male   DOB: 12/31/1928, 83 y.o.   MRN: [id number] Subjective: Patient presents today status post osteomyelitis resection with cellulitis to the right foot. Date of surgery was 09/08/2016. The patient's had wound VAC therapy for the past 4 weeks with significant improvement of the dehiscence ulceration site. Patient presents today for further treatment and evaluation  Objective: Today dressings were removed and changed. There is an moderate amount of drainage noted within the incision site.  Dehiscence of the incision site is noted with exposure of the underlying tissues including muscle and deep fascial compartments. Dehiscence site measures 3.02.50.2cm (LxWxD).  To the dehiscence site ulceration, there is no eschar, there is a moderate amount of slough fibrin and necrotic tissue noted. Granulation tissue is red. Wound base is red. There is a moderate amount of serous drainage noted. Periwound integrity is intact. Ulceration noted to the lateral aspect of the right ankle has healed. Complete reepithelialization has occurred. No drainage noted  Assessment:  #1 status post osteomyelitis resection with cellulitis to the right foot with placement of antibiotic beads-first MPJ. #2 dehiscence site first MPJ right foot  #3 venous insufficiency with varicosities  Plan of care: #1 today patient was evaluated #2 medically necessary excisional debridement including muscle and deep fascial layers was performed to the ulceration sites using a curet and tissue nipper. Excisional debridement of all necrotic nonviable tissue down to healthy bleeding viable tissue was performed with post-debridement measurements same as pre-. negative pressure wound VAC therapy was applied today to the first MPJ ulceration right foot. #3 today were going to discontinue the negative pressure wound VAC therapy.  #4 modification of home health dressing change orders are provided. Dressing changes every  other day 6 weeks. Cleanse wound dressed with Prisma collagen dressing and dry sterile dressing #5 today were in place a referral for vascular consultation #6 return to clinic in 2 weeks

## 2016-12-31 NOTE — Progress Notes (Signed)
Patient ID: [name], male   DOB: 1929/07/04, 83 y.o.   MRN: [id number] Subjective: Patient presents today status post osteomyelitis resection with cellulitis to the right foot. Date of surgery was 09/08/2016. Patient presents today with concern for acute changes to the surgical site. The wound has dehisced and has been managed currently however he is notices significant significant increase in drainage with erythema surrounding the surgical site.  Objective: Today dressings were removed and changed. There is an moderate amount of drainage noted within the incision site.  Dehiscence of the incision site is noted with exposure of the underlying tissues including muscle and deep fascial compartments. Dehiscence site measures 3.02.51.5cm (LxWxD).  To the dehiscence site ulceration, there is no eschar, there is a moderate amount of slough fibrin and necrotic tissue noted. Granulation tissue is red. Wound base is red. There is a moderate amount of serous drainage noted. Periwound integrity is intact. Ulceration noted to the lateral aspect of the right ankle has healed. Complete reepithelialization has occurred. No drainage noted  Assessment:  #1 status post osteomyelitis resection with cellulitis to the right foot with placement of antibiotic beads-first MPJ. #2 dehiscence site first MPJ right foot  #3 cellulitis right foot  #4 ulceration lateral ankle right secondary to venous insufficiency and pressure-resolved  Plan of care: #1 today patient was evaluated #2 medically necessary excisional debridement including muscle and deep fascial layers was performed to the ulceration sites using a curet and tissue nipper. Excisional debridement of all necrotic nonviable tissue down to healthy bleeding viable tissue was performed with post-debridement measurements same as pre-. negative pressure wound VAC therapy was applied today to the first MPJ ulceration right foot. #3 application of negative pressure  wound VAC therapy was applied today #4 return to clinic in 2 weeks

## 2017-01-14 ENCOUNTER — Encounter: Payer: Self-pay | Admitting: Podiatry

## 2017-01-14 ENCOUNTER — Ambulatory Visit (INDEPENDENT_AMBULATORY_CARE_PROVIDER_SITE_OTHER): Payer: Medicare Other | Admitting: Podiatry

## 2017-01-14 DIAGNOSIS — I83015 Varicose veins of right lower extremity with ulcer other part of foot: Secondary | ICD-10-CM

## 2017-01-14 DIAGNOSIS — L97513 Non-pressure chronic ulcer of other part of right foot with necrosis of muscle: Secondary | ICD-10-CM

## 2017-01-14 DIAGNOSIS — T8131XD Disruption of external operation (surgical) wound, not elsewhere classified, subsequent encounter: Secondary | ICD-10-CM

## 2017-01-14 DIAGNOSIS — L97519 Non-pressure chronic ulcer of other part of right foot with unspecified severity: Principal | ICD-10-CM

## 2017-01-16 ENCOUNTER — Telehealth: Payer: Self-pay | Admitting: Oncology

## 2017-01-16 NOTE — Telephone Encounter (Signed)
R/s lab appt from 01/21/17 to 01/26/2017 lab appt to be on CT day- Per RN Erline Levine , no need to contact patient. Patient is already aware and checks My Chart per [facility].

## 2017-01-18 ENCOUNTER — Other Ambulatory Visit: Payer: Self-pay | Admitting: Internal Medicine

## 2017-01-20 NOTE — Progress Notes (Signed)
Patient ID: [name], male   DOB: [birth year], 83 y.o.   MRN: [id number] Subjective: Patient presents today status post osteomyelitis resection with cellulitis to the right foot. Date of surgery was 09/08/2016. Patient believes that his foot is doing okay. Patient denies pain.  Objective: Wound #1 noted to the medial aspect of the right foot measuring approximately 3.02.50.2cm (LxWxD).  To the dehiscence site ulceration, there is no eschar, there is a moderate amount of slough fibrin and necrotic tissue noted. Granulation tissue is red. Wound base is red. There is a moderate amount of serous drainage noted. Periwound integrity is intact. There is no exposed bone muscle-tendon ligament or joint. Ulceration noted to the lateral aspect of the right ankle has healed. Complete reepithelialization has occurred. No drainage noted  Assessment:  #1 status post osteomyelitis resection with cellulitis to the right foot with placement of antibiotic beads-first MPJ. #2 ulceration right foot secondary to venous insufficiency #3 venous insufficiency with varicosities  Plan of care: #1 today patient was evaluated #2 medically necessary excisional debridement including muscle and deep fascial layers was performed to the ulceration sites using a curet and tissue nipper. Excisional debridement of all necrotic nonviable tissue down to healthy bleeding viable tissue was performed with post-debridement measurements same as pre-. negative pressure wound VAC therapy was applied today to the first MPJ ulceration right foot. #3 continue home health care dressing changes using collagen dressing. .  #4 follow-up with patient regarding vascular consultation. #5 return to clinic in 2 weeks  Edrick Kins, DPM [facility]  Dr. Edrick Kins, [address]                                        [place], [place] [address]                Office [phone]  Fax [phone]

## 2017-01-21 ENCOUNTER — Ambulatory Visit (HOSPITAL_COMMUNITY): Payer: Medicare Other

## 2017-01-21 ENCOUNTER — Other Ambulatory Visit: Payer: Medicare Other

## 2017-01-26 ENCOUNTER — Ambulatory Visit (HOSPITAL_COMMUNITY)
Admission: RE | Admit: 2017-01-26 | Discharge: 2017-01-26 | Disposition: A | Discharge status: Home / Self Care | Payer: Medicare Other | Source: Ambulatory Visit | Attending: Oncology | Admitting: Oncology

## 2017-01-26 ENCOUNTER — Other Ambulatory Visit (HOSPITAL_BASED_OUTPATIENT_CLINIC_OR_DEPARTMENT_OTHER): Payer: Medicare Other

## 2017-01-26 DIAGNOSIS — I2510 Atherosclerotic heart disease of native coronary artery without angina pectoris: Secondary | ICD-10-CM | POA: Insufficient documentation

## 2017-01-26 DIAGNOSIS — R590 Localized enlarged lymph nodes: Secondary | ICD-10-CM | POA: Diagnosis not present

## 2017-01-26 DIAGNOSIS — C649 Malignant neoplasm of unspecified kidney, except renal pelvis: Secondary | ICD-10-CM

## 2017-01-26 DIAGNOSIS — C3431 Malignant neoplasm of lower lobe, right bronchus or lung: Secondary | ICD-10-CM | POA: Diagnosis not present

## 2017-01-26 DIAGNOSIS — I700 Atherosclerosis of aorta: Secondary | ICD-10-CM | POA: Insufficient documentation

## 2017-01-26 DIAGNOSIS — Z905 Acquired absence of kidney: Secondary | ICD-10-CM | POA: Diagnosis not present

## 2017-01-26 DIAGNOSIS — R160 Hepatomegaly, not elsewhere classified: Secondary | ICD-10-CM | POA: Diagnosis not present

## 2017-01-26 DIAGNOSIS — C642 Malignant neoplasm of left kidney, except renal pelvis: Secondary | ICD-10-CM | POA: Diagnosis not present

## 2017-01-26 LAB — CBC WITH DIFFERENTIAL/PLATELET
BASO%: 0.9 % (ref 0.0–2.0)
Basophils Absolute: 0.1 10*3/uL (ref 0.0–0.1)
EOS%: 5.9 % (ref 0.0–7.0)
Eosinophils Absolute: 0.5 10*3/uL (ref 0.0–0.5)
HCT: 29.4 % — ABNORMAL LOW (ref 38.4–49.9)
HGB: 9.2 g/dL — ABNORMAL LOW (ref 13.0–17.1)
LYMPH%: 22.9 % (ref 14.0–49.0)
MCH: 29.8 pg (ref 27.2–33.4)
MCHC: 31.3 g/dL — AB (ref 32.0–36.0)
MCV: 95.1 fL (ref 79.3–98.0)
MONO#: 0.7 10*3/uL (ref 0.1–0.9)
MONO%: 8 % (ref 0.0–14.0)
NEUT%: 62.3 % (ref 39.0–75.0)
NEUTROS ABS: 5.1 10*3/uL (ref 1.5–6.5)
PLATELETS: 213 10*3/uL (ref 140–400)
RBC: 3.09 10*6/uL — AB (ref 4.20–5.82)
RDW: 15.5 % — ABNORMAL HIGH (ref 11.0–14.6)
WBC: 8.2 10*3/uL (ref 4.0–10.3)
lymph#: 1.9 10*3/uL (ref 0.9–3.3)

## 2017-01-26 LAB — COMPREHENSIVE METABOLIC PANEL
ALK PHOS: 83 U/L (ref 40–150)
ALT: 11 U/L (ref 0–55)
AST: 12 U/L (ref 5–34)
Albumin: 3.1 g/dL — ABNORMAL LOW (ref 3.5–5.0)
Anion Gap: 10 mEq/L (ref 3–11)
BILIRUBIN TOTAL: 0.33 mg/dL (ref 0.20–1.20)
BUN: 44.4 mg/dL — AB (ref 7.0–26.0)
CHLORIDE: 103 meq/L (ref 98–109)
CO2: 25 meq/L (ref 22–29)
CREATININE: 2.3 mg/dL — AB (ref 0.7–1.3)
Calcium: 8.9 mg/dL (ref 8.4–10.4)
EGFR: 25 mL/min/{1.73_m2} — ABNORMAL LOW (ref >90)
Glucose: 148 mg/dl — ABNORMAL HIGH (ref 70–140)
Potassium: 3.8 mEq/L (ref 3.5–5.1)
Sodium: 139 mEq/L (ref 136–145)
TOTAL PROTEIN: 7.1 g/dL (ref 6.4–8.3)

## 2017-01-28 ENCOUNTER — Ambulatory Visit (INDEPENDENT_AMBULATORY_CARE_PROVIDER_SITE_OTHER): Payer: Medicare Other | Admitting: Podiatry

## 2017-01-28 ENCOUNTER — Encounter: Payer: Self-pay | Admitting: Podiatry

## 2017-01-28 DIAGNOSIS — I83015 Varicose veins of right lower extremity with ulcer other part of foot: Secondary | ICD-10-CM | POA: Diagnosis not present

## 2017-01-28 DIAGNOSIS — L97512 Non-pressure chronic ulcer of other part of right foot with fat layer exposed: Secondary | ICD-10-CM

## 2017-01-28 DIAGNOSIS — L97519 Non-pressure chronic ulcer of other part of right foot with unspecified severity: Principal | ICD-10-CM

## 2017-01-29 ENCOUNTER — Telehealth: Payer: Self-pay | Admitting: Oncology

## 2017-01-29 ENCOUNTER — Ambulatory Visit (HOSPITAL_BASED_OUTPATIENT_CLINIC_OR_DEPARTMENT_OTHER): Payer: Medicare Other | Admitting: Oncology

## 2017-01-29 VITALS — BP 147/52 | HR 72 | Temp 98.0°F | Resp 18 | Ht 69.0 in

## 2017-01-29 DIAGNOSIS — C642 Malignant neoplasm of left kidney, except renal pelvis: Secondary | ICD-10-CM | POA: Diagnosis not present

## 2017-01-29 DIAGNOSIS — C7801 Secondary malignant neoplasm of right lung: Secondary | ICD-10-CM

## 2017-01-29 NOTE — Telephone Encounter (Signed)
Gave patient AVS and calender per 3/22 los . [facility] will contact patient with ct schedule.

## 2017-01-29 NOTE — Progress Notes (Signed)
Hematology and Oncology Follow Up Visit  [name] [id number] 04/06/1929 83 y.o. 01/29/2017 2:54 PM [name],Chad DAVID, MDMcKeown, Chad Saxon, MD   Principle Diagnosis: 83 year old gentleman diagnosed with renal cell carcinoma in June 2013. He had a T2a clear cell histology after  presented with painless 9.6 x 9.2 x 7.8 cm left renal neoplasm with central necrosis suspicious for malignancy. He has documented pulmonary metastasis.   Prior Therapy:   He underwent a left laparoscopic radical nephrectomy on 04/29/2012 by Dr. Alinda Money. 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 The margins were negative of tumor without any evidence of local lymphadenopathy. The final pathological staging was T2 1 N0.  He is status post biopsy of right lower lung nodule obtained on 07/30/2015 which confirmed the presence of renal cell carcinoma.  He is status post radiation therapy to an isolated lung metastasis in the right lower lobe. He received total 54 gray in 3 fractions on 10/08/2015, 09/11/2015 and 09/14/2015.  Current therapy: Observation and surveillance.  Interim History: [name] presents today for a follow-up visit. Since the last visit, he was hospitalized for osteomyelitis and October 2017. He continues to have felt well and healing issues and vascular insufficiency in his lower extremity. He did require irrigation and debridement of his right foot and right ankle wound. He is recovering reasonably well from these procedures and still ambulating with the help of a walker. He is not able to drive but it tends to most activities of daily living including living independently.  He denied any recent complaints related to his kidney cancer. He denied any hematuria, pelvic pain or pulmonary symptoms. He denied any hemoptysis or hematemesis.  He does not report any headaches, blurry vision, syncope or seizures. He does not report any  fevers, chills, sweats weight loss or appetite changes. He does not report any chest pain. He does not report any nausea, vomiting, abdominal pain, early satiety, change in his bowel habits or rectal bleeding. He does not report any frequency, urgency, hesitancy or skeletal complaints. He does not report any lymphadenopathy or petechiae. He does report hearing deficits which is unchanged. and the remaining review of system is unremarkable  Medications: I have reviewed the patient's current medications.  Current Outpatient Prescriptions  Medication Sig Dispense Refill  . allopurinol (ZYLOPRIM) 300 MG tablet TAKE 1 TABLET DAILY 90 tablet 1  . amLODipine (NORVASC) 10 MG tablet Take 1 tablet (10 mg total) by mouth daily before breakfast. 30 tablet 0  . aspirin EC 81 MG tablet Take 81 mg by mouth daily.    . bumetanide (BUMEX) 2 MG tablet TAKE ONE-HALF (1/2) TO ONE TABLET DAILY FOR FLUID 90 tablet 2  . Cholecalciferol (VITAMIN D) 2000 units tablet Take by mouth.    . diclofenac sodium (VOLTAREN) 1 % GEL Apply 4 g topically 4 (four) times daily. 100 g 3  . doxycycline (VIBRA-TABS) 100 MG tablet Take 1 tablet (100 mg total) by mouth 2 (two) times daily. 20 tablet 0  . FERROUS SULFATE PO Take 1 tablet by mouth 3 (three) times daily.     Marland Kitchen levothyroxine (SYNTHROID, LEVOTHROID) 150 MCG tablet TAKE 1 TABLET DAILY BUT TAKE ONE AND ONE-HALF TABLETS ON SUNDAY, TAKE 1 HOUR BEFORE FOOD, ONLY WITH WATER 90 tablet 1  . Magnesium 250 MG TABS Take 500 mg by mouth daily.     Marland Kitchen menthol-cetylpyridinium (CEPACOL) 3 MG lozenge Take 1 lozenge (3  mg total) by mouth as needed for sore throat. 100 tablet 12  . Multiple Vitamin (MULITIVITAMIN WITH MINERALS) TABS Take 1 tablet by mouth daily.    . pravastatin (PRAVACHOL) 40 MG tablet Take 1 tablet (40 mg total) by mouth at bedtime. 90 tablet 4  . traMADol (ULTRAM) 50 MG tablet Take 1 tablet (50 mg total) by mouth 3 (three) times daily as needed for severe pain. 20 tablet 0  .  vitamin C (ASCORBIC ACID) 500 MG tablet Take 1,000 mg by mouth daily.     . nitroGLYCERIN (NITROSTAT) 0.4 MG SL tablet Place 0.4 mg under the tongue every 5 (five) minutes as needed for chest pain.      No current facility-administered medications for this visit.      Allergies: No Known Allergies  Past Medical History, Surgical history, Social history, and Family History were reviewed and updated.   Physical Exam: Blood pressure (!) 147/52, pulse 72, temperature 98 F (36.7 C), temperature source Oral, resp. rate 18, height '5\' 9"'$  (1.753 m), SpO2 98 %. ECOG: 2 General appearance: Alert, awake gentleman without distress. Head: Normocephalic, without obvious abnormality no ulcers or lesions. Neck: no adenopathy Lymph nodes: Cervical, supraclavicular, and axillary nodes normal. Heart:regular rate and rhythm, S1, S2 normal, no murmur, click, rub or gallop Lung:chest clear, no wheezing, rales, normal symmetric air entry.  Abdomin: soft, non-tender, without masses or organomegaly no rebound or guarding. EXT:no erythema, induration, or nodules   Lab Results: Lab Results  Component Value Date   WBC 8.2 01/26/2017   HGB 9.2 (L) 01/26/2017   HCT 29.4 (L) 01/26/2017   MCV 95.1 01/26/2017   PLT 213 01/26/2017     Chemistry      Component Value Date/Time   NA 139 01/26/2017 0835   K 3.8 01/26/2017 0835   CL 101 12/29/2016 1427   CO2 25 01/26/2017 0835   BUN 44.4 (H) 01/26/2017 0835   CREATININE 2.3 (H) 01/26/2017 0835   GLU 111 09/12/2016      Component Value Date/Time   CALCIUM 8.9 01/26/2017 0835   ALKPHOS 83 01/26/2017 0835   AST 12 01/26/2017 0835   ALT 11 01/26/2017 0835   BILITOT 0.33 01/26/2017 0835      EXAM: CT CHEST, ABDOMEN AND PELVIS WITHOUT CONTRAST  TECHNIQUE: Multidetector CT imaging of the chest, abdomen and pelvis was performed following the standard protocol without IV contrast.  COMPARISON:  07/22/2016.  FINDINGS: CT CHEST  FINDINGS  Cardiovascular: Atherosclerotic calcification of the arterial vasculature, including three-vessel involvement of the coronary arteries. Heart is mildly enlarged. No pericardial effusion.  Mediastinum/Nodes: No pathologically enlarged mediastinal or axillary lymph nodes. Hilar regions are difficult to evaluate without IV contrast. Esophagus is grossly unremarkable.  Lungs/Pleura: Focal consolidation and surrounding volume loss are seen in the right lower lobe, indicative of radiation therapy. Mild scattered volume loss in the lung bases. No pleural fluid. Airway is unremarkable.  Musculoskeletal: No worrisome lytic or sclerotic lesions. Degenerative changes are seen in the spine.  CT ABDOMEN PELVIS FINDINGS  Hepatobiliary: Liver measures 18.3 cm. Cholecystectomy. No biliary ductal dilatation.  Pancreas: Negative.  Spleen: Negative.  Adrenals/Urinary Tract: Adrenal glands and right kidney are unremarkable. Right ureter is decompressed. Left nephrectomy. Scattered bladder diverticula.  Stomach/Bowel: Stomach, small bowel and colon are unremarkable. Appendix not readily visualized.  Vascular/Lymphatic: Atherosclerotic calcification of the arterial vasculature without abdominal aortic aneurysm. Bilateral external iliac lymph nodes measure up to 1.5 cm on the right, stable.  Reproductive: Prostate  is visualized.  Other: No free fluid. Mild haziness nodularity in the small bowel mesenteries is unchanged.  Musculoskeletal: Advanced degenerative changes in the hips and spine. Flowing anterior osteophytosis in the thoracic spine. No worrisome lytic or sclerotic lesions.  IMPRESSION: 1. Posttreatment changes in the right lower lobe without evidence of recurrent or metastatic disease. 2. Left nephrectomy without evidence of metastatic disease. 3. Enlarged external iliac lymph nodes, stable. 4. Aortic atherosclerosis (ICD10-170.0). Coronary  artery calcification. 5. Mild hepatomegaly.    Impression and Plan:  83 year old gentleman with the following issues:  1. Renal cell carcinoma diagnosed in June 2013. He presented with a 9 cm mass and underwent a radical nephrectomy on June 2013. The final pathology showed clear cell renal cell carcinoma with the final pathological staging of T2a without any lymph node involvement. He remained disease-free with imaging studies up to August 2016. Now has stage IV disease.  He is status post radiation therapy to an isolated pulmonary metastasis completed in November 2016.   CT scan obtained on 01/26/2017 was reviewed today and showed no evidence of recurrent disease. The plan is to continue with active surveillance and repeat imaging studies every 6 months.    2. 17 mm right lower lung nodule discovered on a CT scan on August 2016. This is biopsy proven to be metastatic renal cell carcinoma. Status post ablative therapy by radiation completed in November 2016 positive response to therapy.  3. Follow-up: Will be in 6 months and repeat imaging studies.     Zola Button, MD 3/22/20182:54 PM

## 2017-02-04 NOTE — Progress Notes (Signed)
Patient ID: [name], male   DOB: 1929/03/07, 83 y.o.   MRN: [id number] Subjective: Patient presents today status post osteomyelitis resection with cellulitis to the right foot. Date of surgery was 09/08/2016. Patient believes that his foot is doing okay. Patient denies pain.  Objective: Wound #1 noted to the medial aspect of the right foot measuring approximately 2.52.50.2cm (LxWxD).  To the dehiscence site ulceration, there is no eschar, there is a moderate amount of slough fibrin and necrotic tissue noted. Granulation tissue is red. Wound base is red. There is a moderate amount of serous drainage noted. Periwound integrity is intact. There is no exposed bone muscle-tendon ligament or joint.  Assessment:  #1 status post osteomyelitis resection with cellulitis to the right foot with placement of antibiotic beads-first MPJ. #2 ulceration right foot secondary to venous insufficiency #3 venous insufficiency with varicosities  Plan of care: #1 today patient was evaluated #2 medically necessary excisional debridement including subcutaneous tissue was performed to the ulceration sites using a curet and tissue nipper. Excisional debridement of all necrotic nonviable tissue down to healthy bleeding viable tissue was performed with post-debridement measurements same as pre-. negative pressure wound VAC therapy was applied today to the first MPJ ulceration right foot. #3 continue home health care dressing changes using collagen dressing. .  #4 follow-up with patient regarding vascular consultation. #5 return to clinic in 2 weeks  Edrick Kins, DPM [facility]  Dr. Edrick Kins, [address]                                        [place], [place] [address]                Office [phone]  Fax [phone]

## 2017-02-11 ENCOUNTER — Encounter: Payer: Self-pay | Admitting: Podiatry

## 2017-02-11 ENCOUNTER — Ambulatory Visit (INDEPENDENT_AMBULATORY_CARE_PROVIDER_SITE_OTHER): Payer: Medicare Other | Admitting: Podiatry

## 2017-02-11 DIAGNOSIS — T8131XD Disruption of external operation (surgical) wound, not elsewhere classified, subsequent encounter: Secondary | ICD-10-CM

## 2017-02-11 DIAGNOSIS — I83015 Varicose veins of right lower extremity with ulcer other part of foot: Secondary | ICD-10-CM | POA: Diagnosis not present

## 2017-02-11 DIAGNOSIS — L97512 Non-pressure chronic ulcer of other part of right foot with fat layer exposed: Secondary | ICD-10-CM | POA: Diagnosis not present

## 2017-02-11 DIAGNOSIS — L97519 Non-pressure chronic ulcer of other part of right foot with unspecified severity: Principal | ICD-10-CM

## 2017-02-14 NOTE — Progress Notes (Signed)
Patient ID: [name], male   DOB: 10/31/1929, 83 y.o.   MRN: [id number] Subjective: Patient presents today status post osteomyelitis resection with cellulitis to the right foot. Date of surgery was 09/08/2016. Patient believes that his foot is doing okay. Patient denies pain. He states the wound care nurse comes out on Mondays, Wednesdays and Fridays to change the bandage.  Objective: Wound #1 noted to the medial aspect of the right foot measuring approximately 2.52.50.2cm (LxWxD).  To the dehiscence site ulceration, there is no eschar, there is a moderate amount of slough fibrin and necrotic tissue noted. Granulation tissue is red. Wound base is red. There is a moderate amount of serous drainage noted. Periwound integrity is intact. There is no exposed bone muscle-tendon ligament or joint.  Assessment:  #1 status post osteomyelitis resection with cellulitis to the right foot with placement of antibiotic beads-first MPJ. #2 ulceration right foot secondary to venous insufficiency #3 venous insufficiency with varicosities  Plan of care: #1 today patient was evaluated #2 medically necessary excisional debridement including subcutaneous tissue was performed to the ulceration sites using a curet and tissue nipper. Excisional debridement of all necrotic nonviable tissue down to healthy bleeding viable tissue was performed with post-debridement measurements same as pre-. negative pressure wound VAC therapy was applied today to the first MPJ ulceration right foot. #3 continue home health care dressing changes using collagen dressing. .  #4 follow-up with patient regarding vascular consultation. #5 return to clinic in 2 weeks  Edrick Kins, DPM [facility]  Dr. Edrick Kins, [address]                                        [place], [place] [address]                Office [phone]  Fax [phone]

## 2017-02-16 ENCOUNTER — Encounter: Payer: Self-pay | Admitting: Vascular Surgery

## 2017-02-18 ENCOUNTER — Other Ambulatory Visit: Payer: Self-pay

## 2017-02-18 DIAGNOSIS — L97519 Non-pressure chronic ulcer of other part of right foot with unspecified severity: Secondary | ICD-10-CM

## 2017-02-25 ENCOUNTER — Encounter: Payer: Self-pay | Admitting: Podiatry

## 2017-02-25 ENCOUNTER — Ambulatory Visit (INDEPENDENT_AMBULATORY_CARE_PROVIDER_SITE_OTHER): Payer: Medicare Other | Admitting: Vascular Surgery

## 2017-02-25 ENCOUNTER — Ambulatory Visit (HOSPITAL_COMMUNITY)
Admission: RE | Admit: 2017-02-25 | Discharge: 2017-02-25 | Disposition: A | Discharge status: Home / Self Care | Payer: Medicare Other | Source: Ambulatory Visit | Attending: Vascular Surgery | Admitting: Vascular Surgery

## 2017-02-25 ENCOUNTER — Encounter: Payer: Self-pay | Admitting: Vascular Surgery

## 2017-02-25 ENCOUNTER — Ambulatory Visit (INDEPENDENT_AMBULATORY_CARE_PROVIDER_SITE_OTHER): Payer: Medicare Other | Admitting: Podiatry

## 2017-02-25 VITALS — BP 142/77 | HR 83 | Temp 97.2°F | Resp 20 | Ht 69.0 in | Wt 255.0 lb

## 2017-02-25 DIAGNOSIS — I83015 Varicose veins of right lower extremity with ulcer other part of foot: Secondary | ICD-10-CM | POA: Diagnosis not present

## 2017-02-25 DIAGNOSIS — I872 Venous insufficiency (chronic) (peripheral): Secondary | ICD-10-CM | POA: Diagnosis not present

## 2017-02-25 DIAGNOSIS — L97519 Non-pressure chronic ulcer of other part of right foot with unspecified severity: Secondary | ICD-10-CM

## 2017-02-25 DIAGNOSIS — L97512 Non-pressure chronic ulcer of other part of right foot with fat layer exposed: Secondary | ICD-10-CM

## 2017-02-25 NOTE — Progress Notes (Signed)
Patient name: [name] MRN: [id number] DOB: [birth year] Sex: male  REASON FOR CONSULT: Nonhealing wounds right foot. Referred by [facility]  HPI: [name] is a 83 y.o. male, who is referred with nonhealing wounds of the right foot. He developed a wound on his lateral malleolus in August 2017 also had a wound over the first metatarsal on the right foot. He was diagnosed with osteomyelitis and underwent debridement of this and ultimately placement of a negative pressure dressing. The patient's daughter states that the wound has made excellent progress and is now almost completely healed. The negative pressure was in place for proximally 3 months.  At this point the patient is essentially nonambulatory and is very limited in mobility. I do not get any history of claudication or rest pain.  His risk factors for peripheral vascular disease include hypertension and a remote history of tobacco use. He denies any history of diabetes, hypercholesterolemia, or family history of premature cardiovascular disease. He quit smoking approximate 40 years ago.  He has undergone previous PTCA in the past.  Past Medical History:  Diagnosis Date  . Anemia   . Arthritis    stenosis - back, neck  . Cancer [facility])    renal neoplasm  . Chronic kidney disease    renalCa- nephrectomy [facility]) 04/2012  . Coronary artery disease   . GERD (gastroesophageal reflux disease)   . Hard of hearing   . Hearing deficit    hearing aids- bilateral  . Hypertension    sees Dr. Wynonia Lawman, stress test recently for prep for surgery  . Hypothyroidism   . 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 Prediabetes   . Vitamin D deficiency     Family History  Problem Relation Age of Onset  . Cancer Sister     breast  . Cancer Brother     colon    SOCIAL HISTORY: Social History   Social History  . Marital status: Widowed    Spouse name: N/A  .  Number of children: N/A  . Years of education: N/A   Occupational History  . Not on file.   Social History Main Topics  . Smoking status: Former Smoker    Types: Cigarettes    Quit date: 11/10/1970  . Smokeless tobacco: Never Used  . Alcohol use 6.6 oz/week    2 Cans of beer, 9 Shots of liquor per week     Comment: friday- beer & 3 drambuie  . Drug use: No  . Sexual activity: No   Other Topics Concern  . Not on file   Social History Narrative  . No narrative on file    No Known Allergies  Current Outpatient Prescriptions  Medication Sig Dispense Refill  . allopurinol (ZYLOPRIM) 300 MG tablet TAKE 1 TABLET DAILY 90 tablet 1  . amLODipine (NORVASC) 10 MG tablet Take 1 tablet (10 mg total) by mouth daily before breakfast. 30 tablet 0  . aspirin EC 81 MG tablet Take 81 mg by mouth daily.    . bumetanide (BUMEX) 2 MG tablet TAKE ONE-HALF (1/2) TO ONE TABLET DAILY FOR FLUID 90 tablet 2  . Cholecalciferol (VITAMIN D) 2000 units tablet Take by mouth.    . diclofenac sodium (VOLTAREN) 1 % GEL Apply 4 g topically 4 (four) times daily. 100 g 3  . FERROUS SULFATE PO Take 1 tablet by mouth 3 (three) times daily.     Marland Kitchen  levothyroxine (SYNTHROID, LEVOTHROID) 150 MCG tablet TAKE 1 TABLET DAILY BUT TAKE ONE AND ONE-HALF TABLETS ON SUNDAY, TAKE 1 HOUR BEFORE FOOD, ONLY WITH WATER 90 tablet 1  . Magnesium 250 MG TABS Take 500 mg by mouth daily.     Marland Kitchen menthol-cetylpyridinium (CEPACOL) 3 MG lozenge Take 1 lozenge (3 mg total) by mouth as needed for sore throat. 100 tablet 12  . Multiple Vitamin (MULITIVITAMIN WITH MINERALS) TABS Take 1 tablet by mouth daily.    . nitroGLYCERIN (NITROSTAT) 0.4 MG SL tablet Place 0.4 mg under the tongue every 5 (five) minutes as needed for chest pain.     . pravastatin (PRAVACHOL) 40 MG tablet Take 1 tablet (40 mg total) by mouth at bedtime. 90 tablet 4  . traMADol (ULTRAM) 50 MG tablet Take 1 tablet (50 mg total) by mouth 3 (three) times daily as needed for severe  pain. 20 tablet 0  . vitamin C (ASCORBIC ACID) 500 MG tablet Take 1,000 mg by mouth daily.      No current facility-administered medications for this visit.     REVIEW OF SYSTEMS:  '[X]'$  denotes positive finding, '[ ]'$  denotes negative finding Cardiac  Comments:  Chest pain or chest pressure:    Shortness of breath upon exertion:    Short of breath when lying flat:    Irregular heart rhythm:        Vascular    Pain in calf, thigh, or hip brought on by ambulation:    Pain in feet at night that wakes you up from your sleep:     Blood clot in your veins:    Leg swelling:         Pulmonary    Oxygen at home:    Productive cough:     Wheezing:         Neurologic    Sudden weakness in arms or legs:     Sudden numbness in arms or legs:     Sudden onset of difficulty speaking or slurred speech:    Temporary loss of vision in one eye:     Problems with dizziness:         Gastrointestinal    Blood in stool:     Vomited blood:         Genitourinary    Burning when urinating:     Blood in urine:        Psychiatric    Major depression:         Hematologic    Bleeding problems:    Problems with blood clotting too easily:        Skin    Rashes or ulcers: X       Constitutional    Fever or chills:      PHYSICAL EXAM: Vitals:   02/25/17 1403  BP: (!) 142/77  Pulse: 83  Resp: 20  Temp: 97.2 F (36.2 C)  TempSrc: Oral  SpO2: 97%  Weight: 255 lb (115.7 kg)  Height: '5\' 9"'$  (1.753 m)    GENERAL: The patient is a well-nourished male, in no acute distress. The vital signs are documented above. CARDIAC: There is a regular rate and rhythm.  VASCULAR: I do not detect carotid bruits. He has palpable femoral and popliteal pulses bilaterally. I cannot palpate pedal pulses although he has leg swelling bilaterally which makes these more difficult to assess.  I was able to obtain biphasic dorsalis pedis and posterior tibial signals bilaterally. He has moderate bilateral lower  extremity swelling. He also has some hyperpigmentation consistent with chronic venous insufficiency. PULMONARY: There is good air exchange bilaterally without wheezing or rales. ABDOMEN: Soft and non-tender with normal pitched bowel sounds.  MUSCULOSKELETAL: There are no major deformities or cyanosis. NEUROLOGIC: No focal weakness or paresthesias are detected. SKIN: The wound on his right lateral malleolus has healed. He has a small wound on the medial aspect of his right foot over the metatarsal head which measures about 3 mm in diameter. I probed this and did not detect any bone. PSYCHIATRIC: The patient has a normal affect.  DATA:   RIGHT LOWER EXTREMITY VENOUS REFLUX STUDY: I have independently interpreted the right lower extremity venous duplex scan. On the right side, there is no evidence of deep venous thrombosis or superficial thrombophlebitis. There is no reflux in the right great saphenous vein although there is some reflux in the saphenofemoral junction.  ARTERIAL DOPPLER STUDY: I did find an arterial Doppler study from 08/18/2016 which shows ABIs of 100% bilaterally with biphasic Doppler signals in the dorsalis pedis and posterior tibial positions bilaterally.  MEDICAL ISSUES:  NONHEALING WOUNDS RIGHT FOOT: Based on my exam and his arterial Doppler study and think he should have adequate arterial flow to heal the wounds on his right foot. Based on his physical exam he does have some evidence of chronic venous insuficiency and I have recommended leg elevation and compression therapy. His duplex does not show any venous disease, although he has evidence of this on exam. Fortunately, I think he does have good circulation and should be able to elevate his legs and do compression therapy as needed. If the wound fails to heal he may need a follow up x-ray to rule out osteomyelitis. Certainly given that he is 83 years old with multiple medical comorbidities I would try a conservative approach  if at all possible. I'll be happy to see him back in anytime if any new vascular issues arise.  Deitra Mayo [facility] [phone]

## 2017-02-26 NOTE — Progress Notes (Signed)
Patient ID: [name], male   DOB: 04-24-1929, 83 y.o.   MRN: [id number] Subjective: Patient presents today status post osteomyelitis resection with cellulitis to the right foot. Date of surgery was 09/08/2016. He is here for follow up of an ulcer to the right foot.    Objective: Wound #1 noted to the medial aspect of the right foot measuring approximately 1.00.70.2cm (LxWxD).  To the dehiscence site ulceration, there is no eschar, there is a moderate amount of slough fibrin and necrotic tissue noted. Granulation tissue is red. Wound base is red. There is a moderate amount of serous drainage noted. Periwound integrity is intact. There is no exposed bone muscle-tendon ligament or joint.  Assessment:  #1 status post osteomyelitis resection with cellulitis to the right foot with placement of antibiotic beads-first MPJ. #2 ulceration right foot secondary to venous insufficiency #3 venous insufficiency with varicosities #4 ingrown toe nail to lateral border of the left great toe  Plan of care: #1 today patient was evaluated #2 medically necessary excisional debridement including subcutaneous tissue was performed to the ulceration sites using a curet and tissue nipper. Excisional debridement of all necrotic nonviable tissue down to healthy bleeding viable tissue was performed with post-debridement measurements same as pre-. negative pressure wound VAC therapy was applied today to the first MPJ ulceration right foot. #3 debridement of open wound was performed to the periungual border of the respective toe using a currette. Antibiotic ointment and Band-Aid was applied.  #4 if ingrown is not better by next appt, will perform partial permanent nail avulsion #5 return to clinic in 3 weeks  Caretaker is going on a cruise.  [name], DPM [facility]  Dr. Edrick Malone, [name]                                        [place], [name] [address]                  Office [phone]  Fax [phone]

## 2017-03-14 ENCOUNTER — Other Ambulatory Visit: Payer: Self-pay | Admitting: Internal Medicine

## 2017-03-14 DIAGNOSIS — G894 Chronic pain syndrome: Secondary | ICD-10-CM

## 2017-03-14 MED ORDER — TRAMADOL HCL 50 MG PO TABS: ORAL_TABLET | ORAL | 0 refills | Status: DC

## 2017-03-18 ENCOUNTER — Ambulatory Visit (INDEPENDENT_AMBULATORY_CARE_PROVIDER_SITE_OTHER): Payer: Medicare Other | Admitting: Podiatry

## 2017-03-18 ENCOUNTER — Encounter: Payer: Self-pay | Admitting: Podiatry

## 2017-03-18 DIAGNOSIS — I83015 Varicose veins of right lower extremity with ulcer other part of foot: Secondary | ICD-10-CM | POA: Diagnosis not present

## 2017-03-18 DIAGNOSIS — L97519 Non-pressure chronic ulcer of other part of right foot with unspecified severity: Secondary | ICD-10-CM

## 2017-03-18 DIAGNOSIS — L97512 Non-pressure chronic ulcer of other part of right foot with fat layer exposed: Secondary | ICD-10-CM

## 2017-03-19 NOTE — Progress Notes (Signed)
Patient ID: [name], male   DOB: 06-Oct-1929, 83 y.o.   MRN: [id number] Subjective: Patient presents today status post osteomyelitis resection with cellulitis to the right foot. Date of surgery was 09/08/2016. He is here for follow up of an ulcer to the right foot which developed postsurgically. The patient has had the wound for greater than 6 weeks and has failed conservative measurements of treatment satisfactory alleviation of symptoms for the patient   Objective: Wound #1 noted to the medial aspect of the right foot measuring approximately 0.8 0.70.1cm (LxWxD).  To the dehiscence site ulceration, there is no eschar, there is a moderate amount of slough fibrin and necrotic tissue noted. Granulation tissue is red. Wound base is red. There is a moderate amount of serous drainage noted. Periwound integrity is intact. There is no exposed bone muscle-tendon ligament or joint.  Assessment:  #1 status post osteomyelitis resection with cellulitis to the right foot with placement of antibiotic beads-first MPJ. #2 ulceration right foot secondary to venous insufficiency #3 venous insufficiency with varicosities #4 ingrown toe nail to lateral border of the left great toe  Plan of care: #1 today patient was evaluated #2 medically necessary excisional debridement including subcutaneous tissue was performed to the ulceration sites using a curet and tissue nipper. Excisional debridement of all necrotic nonviable tissue down to healthy bleeding viable tissue was performed with post-debridement measurements same as pre-. negative pressure wound VAC therapy was applied today to the first MPJ ulceration right foot. #3 the patient will likely require medically necessary application of skin graft, Integra primatrix, 4 application of the ulcer. Please note this graft will be medically necessary. The patient is felt all conservative modalities of treatment to provide any sort of satisfactory alleviation of  symptoms for the patient. Authorization for skin graft application will be initiated today #4 follow-up with patient next visit regarding ingrown toenail lateral border left great toe #5 return to clinic in 2 weeks  Edrick Kins, DPM [facility]  Dr. Edrick Kins, [place]                                        [place], [place] [address]                Office [phone]  Fax [phone]

## 2017-03-27 ENCOUNTER — Encounter: Payer: Self-pay | Admitting: Vascular Surgery

## 2017-03-27 IMAGING — MR MR FOOT*R* W/O CM
4 of 7 series · 19 of 40 positions shown · non-contrast
Comparison: None.

CLINICAL DATA: Chronic kidney disease. Nonhealing medial right
first MP joint ulcer. Right lateral ankle ulcer.

EXAM:
MRI OF THE RIGHT FOREFOOT WITHOUT CONTRAST
TECHNIQUE: Multiplanar, multisequence MR imaging was performed. No intravenous
contrast was administered.

[Series 3: T1 · oblique · 4.0mm · 0.29mm/px · 5 of 36 slices shown (1 of 2)]
[im 1/36]
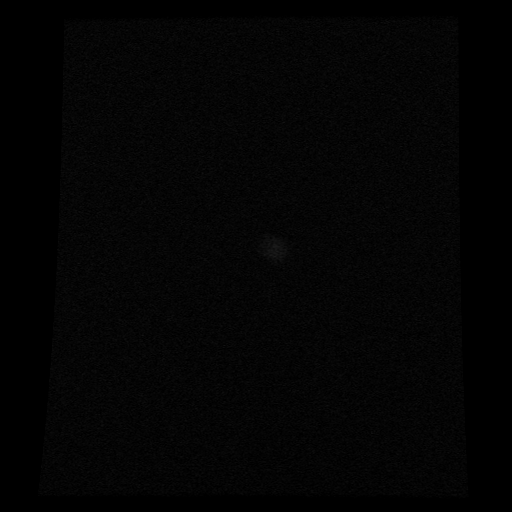
[im 4/36]
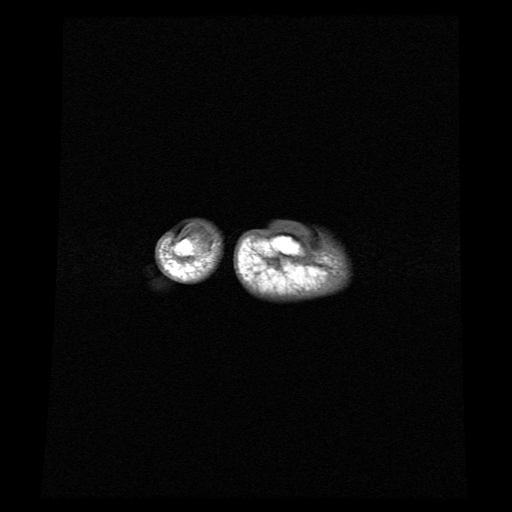
[im 12/36]
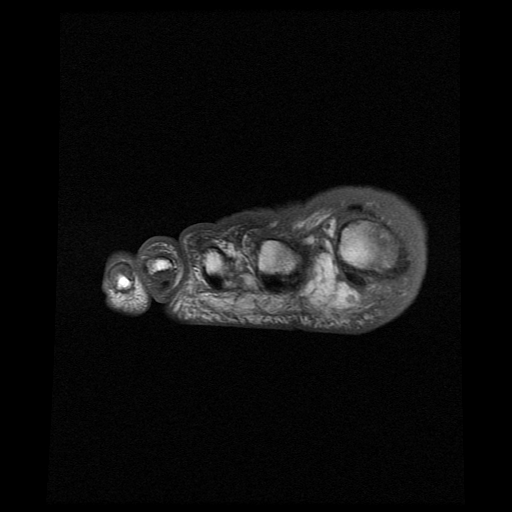
[im 20/36]
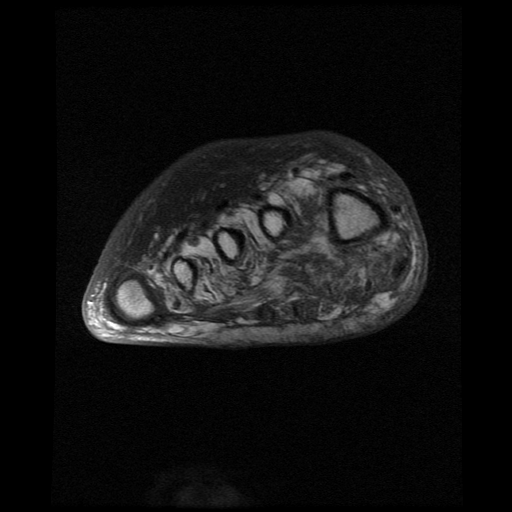
[im 32/36]
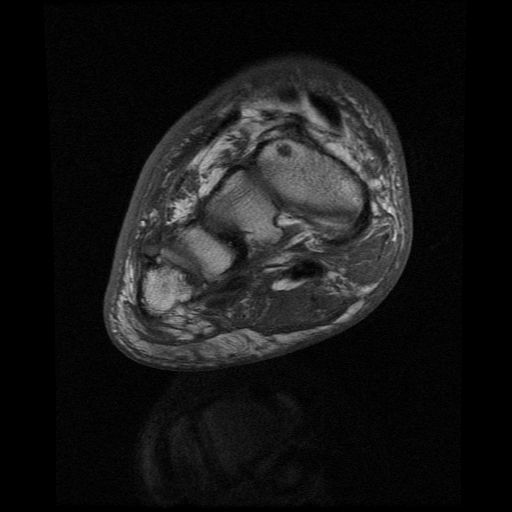

[Series 4: T2 fat-sat · oblique · 4.0mm · 0.29mm/px · 8 of 36 slices shown]
[im 1/36]
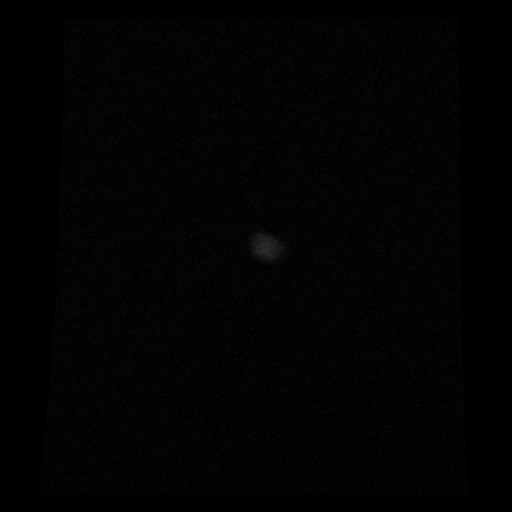
[im 4/36]
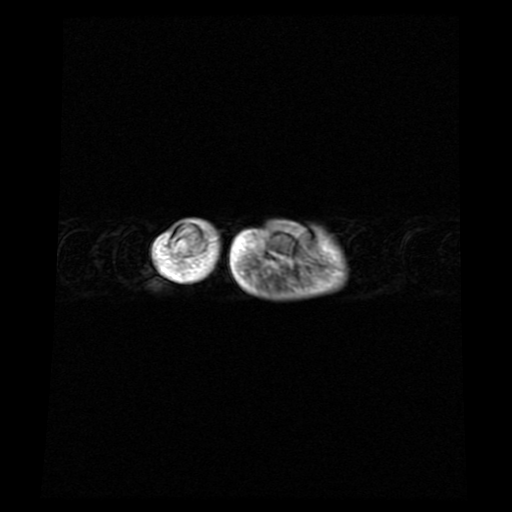
[im 12/36]
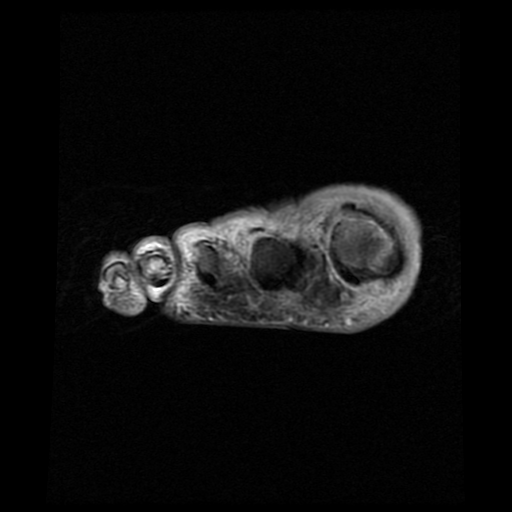
[im 16/36]
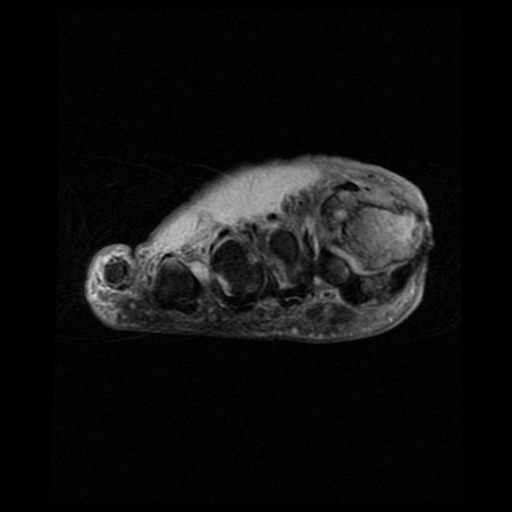
[im 20/36]
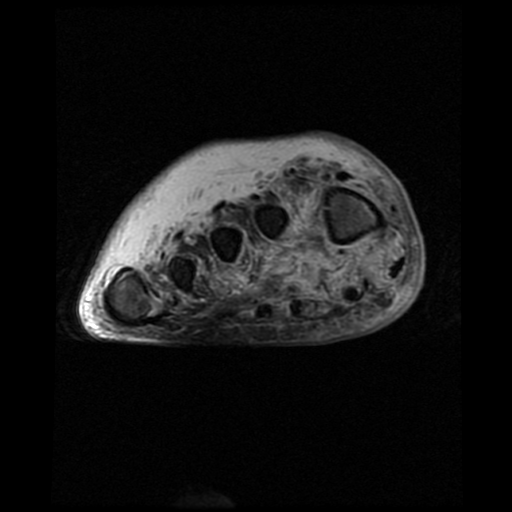
[im 24/36]
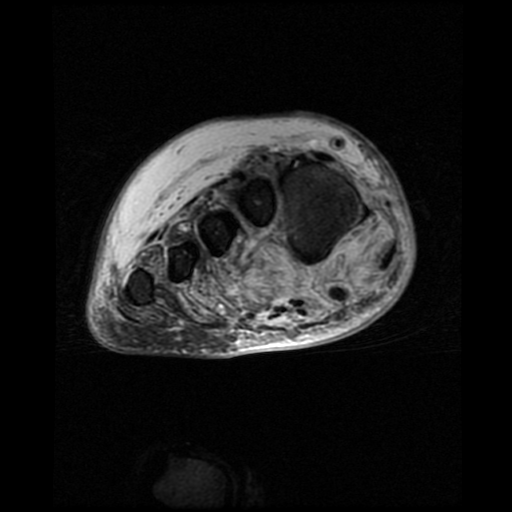
[im 32/36]
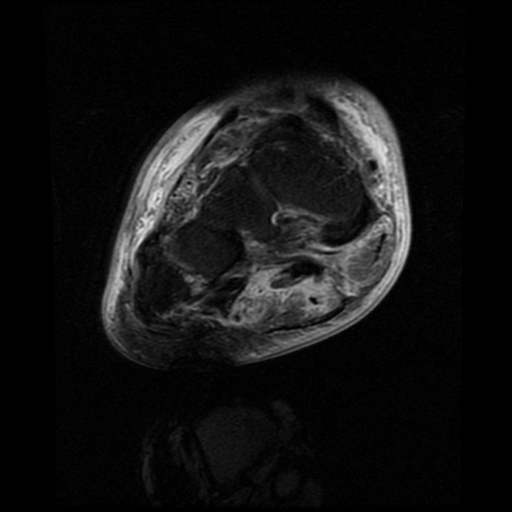
[im 36/36]
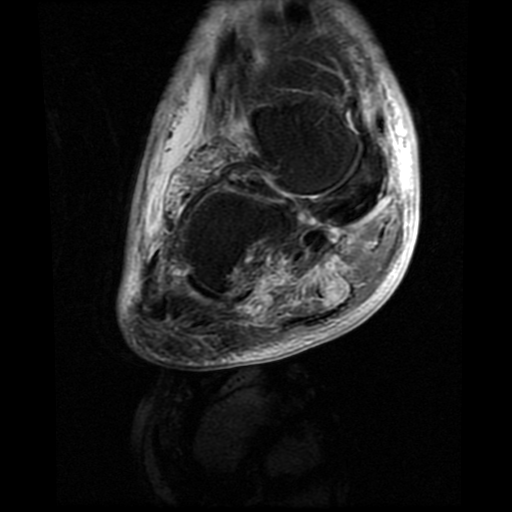

[Series 5: T1 · oblique · 4.0mm · 0.31mm/px · 3 of 13 slices shown (2 of 2)]
[im 1/13]
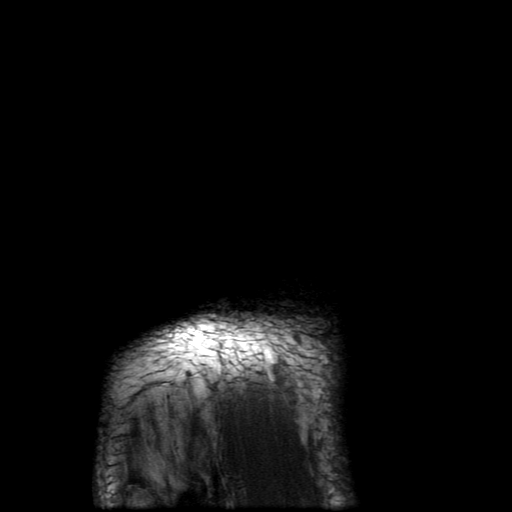
[im 7/13]
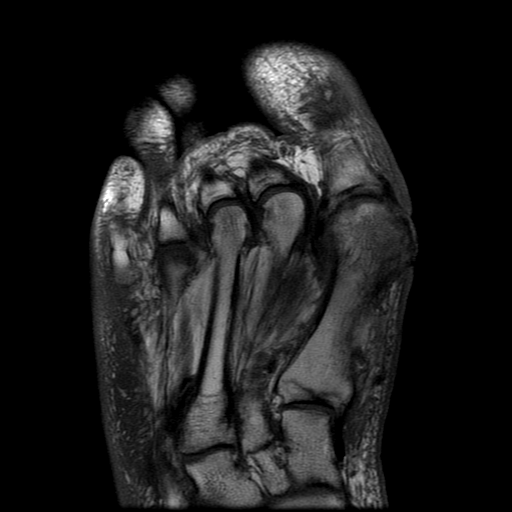
[im 13/13]
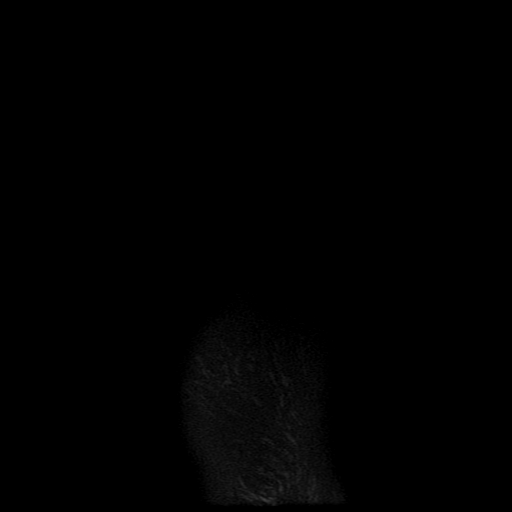

[Series 6: STIR · oblique · 4.0mm · 0.29mm/px · 3 of 16 slices shown]
[im 1/16]
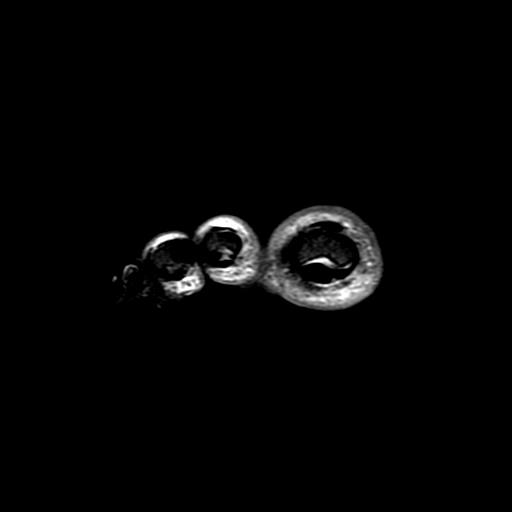
[im 11/16]
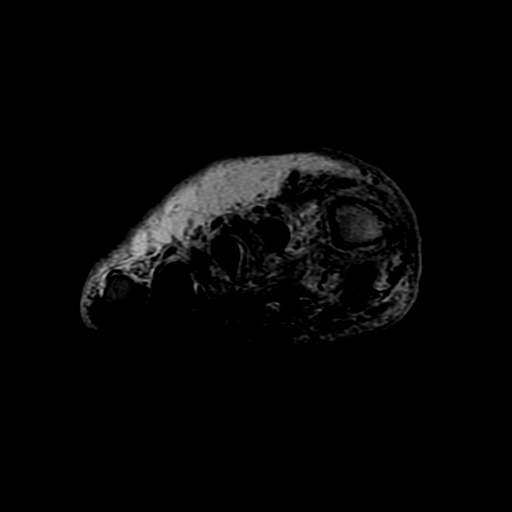
[im 16/16]
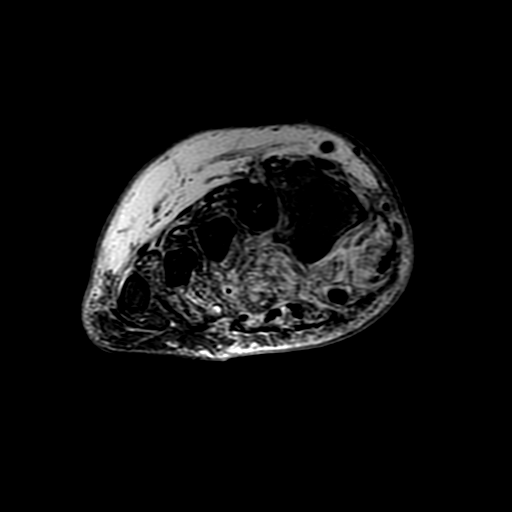

[19 of 40 positions shown; findings below may reference images not displayed]

FINDINGS: Bones/Joint/Cartilage

Soft tissue ulcer along the medial aspect of the first metatarsal
head. Mild underlying cortical irregularity of the medial margin of
the first metatarsal head with underlying marrow edema concerning
for osteomyelitis.

No other areas of bone destruction or periosteal reaction. Mild
marrow edema in the fifth metatarsal head likely reactive.

Hallux valgus of the right foot with moderate osteoarthritis of the
first MTP joint. Mild osteoarthritis of the first IP joint. Mild
osteoarthritis of the second and third MTP joints. No fracture or
dislocation. No joint effusion.

Ligaments

Normal collateral ligaments.  Normal Lisfranc ligament.

Muscles and Tendons
Flexor, peroneal and extensor compartment tendons are intact.
Increased signal in the plantar musculature likely neurogenic.

Soft tissue
No fluid collection or hematoma. No soft tissue mass. Severe soft
tissue edema along the dorsal aspect of the midfoot and forefoot
which may reflect severe cellulitis versus reactive edema.
IMPRESSION: 1. Soft tissue ulcer along the medial aspect of the first metatarsal
head. Mild underlying cortical irregularity of the medial margin of
the first metatarsal head with underlying marrow edema concerning
for osteomyelitis.
2. Severe soft tissue edema along the dorsal aspect of the midfoot
and forefoot which may reflect severe cellulitis versus reactive
edema.
3. No drainable fluid collection to suggest an abscess.
4. Hallux valgus of the right foot with moderate osteoarthritis of
the first MTP joint.

## 2017-03-28 ENCOUNTER — Other Ambulatory Visit: Payer: Self-pay | Admitting: Internal Medicine

## 2017-04-08 ENCOUNTER — Encounter: Payer: Self-pay | Admitting: Vascular Surgery

## 2017-04-08 ENCOUNTER — Ambulatory Visit (INDEPENDENT_AMBULATORY_CARE_PROVIDER_SITE_OTHER): Payer: Medicare Other | Admitting: Podiatry

## 2017-04-08 ENCOUNTER — Ambulatory Visit (INDEPENDENT_AMBULATORY_CARE_PROVIDER_SITE_OTHER): Payer: Medicare Other | Admitting: Vascular Surgery

## 2017-04-08 VITALS — BP 137/67 | HR 70 | Temp 97.2°F | Resp 20 | Ht 69.0 in | Wt 255.0 lb

## 2017-04-08 DIAGNOSIS — T8131XD Disruption of external operation (surgical) wound, not elsewhere classified, subsequent encounter: Secondary | ICD-10-CM | POA: Diagnosis not present

## 2017-04-08 DIAGNOSIS — I872 Venous insufficiency (chronic) (peripheral): Secondary | ICD-10-CM | POA: Diagnosis not present

## 2017-04-08 NOTE — Progress Notes (Signed)
Patient ID: [name], male   DOB: 1929-08-16, 83 y.o.   MRN: [id number] Assessment and Plan:  Hypertension:   -Continue medication,   -recommended taking fluid pill earlier to prevent night time urination  -monitor blood pressure at home.   -Continue DASH diet.    -Reminder to go to the ER if any CP, SOB, nausea, dizziness, severe HA, changes vision/speech, left arm numbness and tingling, and jaw pain.  Cholesterol:  -Continue diet and exercise.   -Check cholesterol.   Pre-diabetes:  -Continue diet   -Check A1C  Vitamin D Def:  -check level  -continue medications.   Medication management -     Magnesium  Hypothyroidism, unspecified hypothyroidism type Hypothyroidism-check TSH level, continue medications the same, reminded to take on an empty stomach 30-23mins before food.  - wants decrease med if possible -     TSH  CKD, stage IV (GFR 28 ml/min) (HCC) -     BASIC METABOLIC PANEL WITH GFR -  avoid NSAIDS, monitor sugars, will monitor  Cancer of kidney, unspecified laterality (HCC) -     BASIC METABOLIC PANEL WITH GFR - Continue follow up Dr. Alen Blew  Ulcer of right ankle, limited to breakdown of skin ([place]) -     Improved, continue better eating, check B12/zinc  Atherosclerosis of aorta (HCC)  - Control blood pressure, cholesterol, glucose, increase exercise.    Continue diet and meds as discussed. Further disposition pending results of labs.  HPI 83 y.o. male  presents for 3 month follow up with hypertension, hyperlipidemia, prediabetes and vitamin D.   His blood pressure has been controlled at home, today their BP is BP: 140/74.   He does not workout. He denies chest pain, shortness of breath, dizziness.  Had nonhealing ulcer that has now healed on right leg, following Dr. Scot Dock.   He has history of CKD due to renal cell cancer and HTN, follows with Dr. Alen Blew.  Lab Results  Component Value Date   GFRNONAA 23 (L) 12/29/2016    He is on cholesterol  medication and denies myalgias. His cholesterol is not at goal. The cholesterol last visit was:   Lab Results  Component Value Date   CHOL 143 12/03/2016   HDL 34 (L) 12/03/2016   LDLCALC 88 12/03/2016   TRIG 106 12/03/2016   CHOLHDL 4.2 12/03/2016    He has not been working on diet and exercise for prediabetes, and denies foot ulcerations, hyperglycemia, hypoglycemia , increased appetite, nausea, paresthesia of the feet, polydipsia, polyuria, visual disturbances, vomiting and weight loss. Last A1C in the office was:  Lab Results  Component Value Date   HGBA1C 5.8 (H) 12/03/2016   Patient is on Vitamin D supplement.  Lab Results  Component Value Date   VD25OH 60 02/28/2016     He is on thyroid medication. His medication was not changed last visit, he is on one pill daily but 1.5mg  on Sunday, complains of having to go to the bathroom too much.    Lab Results  Component Value Date   TSH 0.80 12/29/2016  .  BMI is Body mass index is 37.51 kg/m., he is working on diet and exercise. Wt Readings from Last 3 Encounters:  04/09/17 254 lb (115.2 kg)  04/08/17 255 lb (115.7 kg)  02/25/17 255 lb (115.7 kg)     Current Medications:  Current Outpatient Prescriptions on File Prior to Visit  Medication Sig Dispense Refill  . allopurinol (ZYLOPRIM) 300 MG tablet TAKE 1 TABLET DAILY  90 tablet 1  . amLODipine (NORVASC) 10 MG tablet Take 1 tablet (10 mg total) by mouth daily before breakfast. 30 tablet 0  . aspirin EC 81 MG tablet Take 81 mg by mouth daily.    . bumetanide (BUMEX) 2 MG tablet TAKE ONE-HALF (1/2) TO ONE TABLET DAILY FOR FLUID 90 tablet 2  . Cholecalciferol (VITAMIN D) 2000 units tablet Take by mouth.    . diclofenac sodium (VOLTAREN) 1 % GEL Apply 4 g topically 4 (four) times daily. 100 g 3  . FERROUS SULFATE PO Take 1 tablet by mouth 3 (three) times daily.     Marland Kitchen levothyroxine (SYNTHROID, LEVOTHROID) 150 MCG tablet TAKE 1 TABLET DAILY BUT TAKE ONE AND ONE-HALF TABLETS ON  SUNDAY, TAKE 1 HOUR BEFORE FOOD, ONLY WITH WATER 90 tablet 1  . Magnesium 250 MG TABS Take 500 mg by mouth daily.     Marland Kitchen menthol-cetylpyridinium (CEPACOL) 3 MG lozenge Take 1 lozenge (3 mg total) by mouth as needed for sore throat. 100 tablet 12  . Multiple Vitamin (MULITIVITAMIN WITH MINERALS) TABS Take 1 tablet by mouth daily.    . nitroGLYCERIN (NITROSTAT) 0.4 MG SL tablet Place 0.4 mg under the tongue every 5 (five) minutes as needed for chest pain.     . pravastatin (PRAVACHOL) 40 MG tablet Take 1 tablet (40 mg total) by mouth at bedtime. 90 tablet 4  . traMADol (ULTRAM) 50 MG tablet Take 1/2 to 1 tablet 3 to 4 x / day ONLY if needed for severe pain 30 tablet 0  . vitamin C (ASCORBIC ACID) 500 MG tablet Take 1,000 mg by mouth daily.      No current facility-administered medications on file prior to visit.     Medical History:  Past Medical History:  Diagnosis Date  . Anemia   . Arthritis    stenosis - back, neck  . Cancer [facility])    renal neoplasm  . Chronic kidney disease    renalCa- nephrectomy [facility]) 04/2012  . Coronary artery disease   . GERD (gastroesophageal reflux disease)   . Hard of hearing   . Hearing deficit    hearing aids- bilateral  . Hypertension    sees Dr. Wynonia Lawman, stress test recently for prep for surgery  . Hypothyroidism   . Lung cancer ([place])    right lower lobe nodule suspicious for metastatic clear cell renal cell neoplasm  . Prediabetes   . Vitamin D deficiency     Allergies: No Known Allergies   Review of Systems:  Review of Systems  Constitutional: Negative for chills, fever and malaise/fatigue.  HENT: Negative for congestion, ear pain and sore throat.   Eyes: Negative.   Respiratory: Positive for cough. Negative for sputum production, shortness of breath and wheezing.   Cardiovascular: Positive for leg swelling. Negative for chest pain and palpitations.  Gastrointestinal: Positive for constipation. Negative for blood in stool, diarrhea,  heartburn, melena and nausea.  Genitourinary: Negative.   Musculoskeletal: Positive for joint pain. Negative for back pain, falls, myalgias and neck pain.  Skin: Negative for rash.  Neurological: Negative for dizziness, sensory change, loss of consciousness and headaches.  Psychiatric/Behavioral: Negative for depression. The patient is not nervous/anxious and does not have insomnia.     Family history- Review and unchanged  Social history- Review and unchanged  Physical Exam: BP 140/74   Pulse 87   Temp 98.1 F (36.7 C)   Resp 18   Ht 5\' 9"  (1.753 m)   Wt  254 lb (115.2 kg)   SpO2 96%   BMI 37.51 kg/m  Wt Readings from Last 3 Encounters:  04/09/17 254 lb (115.2 kg)  04/08/17 255 lb (115.7 kg)  02/25/17 255 lb (115.7 kg)    General Appearance: Well nourished well developed, in no apparent distress. Eyes: PERRLA, EOMs, conjunctiva no swelling or erythema ENT/Mouth: Ear canals normal without obstruction, swelling, erythma, discharge.  TMs normal bilaterally.  Oropharynx moist, clear, without exudate, or postoropharyngeal swelling.Decreased hearing Neck: Supple, thyroid normal,no cervical adenopathy  Respiratory: Respiratory effort normal, Breath sounds clear A&P without rhonchi, wheeze, or rale.  No retractions, no accessory usage. Cardio:Distant S1S2 with soft 2/6 murmur on LSB. Decreased bilateral TP/DP with good cap refill, 1-2+ edema.  Abdomen: Soft, + BS,  Non tender, no guarding, rebound, hernias, masses. Musculoskeletal:  Full ROM, 5/5 strength, Patient in wheelchair due to knee pain. Skin: Right foot in a boot. Decrease pulses bilateral feet, right worse than left.  Left cheek with 6x7 mm scaly nodule with dried blood superior. Warm, dry without rashes, lesions, scattered ecchymosis.  Neuro: Awake and oriented X 3, Cranial nerves intact. Normal muscle tone, no cerebellar symptoms. Psych: Normal affect, Insight and Judgment appropriate.    Vicie Mutters, PA-C 12:09  PM [facility]

## 2017-04-08 NOTE — Progress Notes (Signed)
Patient name: [name] MRN: [id number] DOB: 07-08-1929 Sex: male  REASON FOR VISIT:    Chronic venous insufficiency  HPI:   [name] is a 83 y.o. male who I last saw on 02/25/2017 with a nonhealing wound of the right foot. Based on my exam and his arterial Doppler study at that time, I felt that he should have adequate circulation to heal the wounds on his right foot. He did have evidence of chronic venous insufficiency and I discussed the importance of leg elevation and compression therapy. Given his age, 65, certainly we felt that a conservative approach was ideal. He comes in for a 6 week follow up visit.  The wounds now have completely healed. He has no specific complaints. He is ambulatory with a walker. He denies rest pain.  Past Medical History:  Diagnosis Date  . Anemia   . Arthritis    stenosis - back, neck  . Cancer [facility])    renal neoplasm  . Chronic kidney disease    renalCa- nephrectomy [facility]) 04/2012  . Coronary artery disease   . GERD (gastroesophageal reflux disease)   . Hard of hearing   . Hearing deficit    hearing aids- bilateral  . Hypertension    sees Dr. Wynonia Lawman, stress test recently for prep for surgery  . Hypothyroidism   . 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 Prediabetes   . Vitamin D deficiency     Family History  Problem Relation Age of Onset  . Cancer Sister        breast  . Cancer Brother        colon    SOCIAL HISTORY: Social History  Substance Use Topics  . Smoking status: Former Smoker    Types: Cigarettes    Quit date: 11/10/1970  . Smokeless tobacco: Never Used  . Alcohol use 6.6 oz/week    2 Cans of beer, 9 Shots of liquor per week     Comment: friday- beer & 3 drambuie    No Known Allergies  Current Outpatient Prescriptions  Medication Sig Dispense Refill  . allopurinol (ZYLOPRIM) 300 MG tablet TAKE 1 TABLET DAILY 90 tablet 1  . amLODipine  (NORVASC) 10 MG tablet Take 1 tablet (10 mg total) by mouth daily before breakfast. 30 tablet 0  . aspirin EC 81 MG tablet Take 81 mg by mouth daily.    . bumetanide (BUMEX) 2 MG tablet TAKE ONE-HALF (1/2) TO ONE TABLET DAILY FOR FLUID 90 tablet 2  . Cholecalciferol (VITAMIN D) 2000 units tablet Take by mouth.    . diclofenac sodium (VOLTAREN) 1 % GEL Apply 4 g topically 4 (four) times daily. 100 g 3  . FERROUS SULFATE PO Take 1 tablet by mouth 3 (three) times daily.     Marland Kitchen levothyroxine (SYNTHROID, LEVOTHROID) 150 MCG tablet TAKE 1 TABLET DAILY BUT TAKE ONE AND ONE-HALF TABLETS ON SUNDAY, TAKE 1 HOUR BEFORE FOOD, ONLY WITH WATER 90 tablet 1  . Magnesium 250 MG TABS Take 500 mg by mouth daily.     Marland Kitchen menthol-cetylpyridinium (CEPACOL) 3 MG lozenge Take 1 lozenge (3 mg total) by mouth as needed for sore throat. 100 tablet 12  . Multiple Vitamin (MULITIVITAMIN WITH MINERALS) TABS Take 1 tablet by mouth daily.    . nitroGLYCERIN (NITROSTAT) 0.4 MG SL tablet Place 0.4 mg under the tongue every 5 (five) minutes as needed for chest  pain.     . pravastatin (PRAVACHOL) 40 MG tablet Take 1 tablet (40 mg total) by mouth at bedtime. 90 tablet 4  . traMADol (ULTRAM) 50 MG tablet Take 1/2 to 1 tablet 3 to 4 x / day ONLY if needed for severe pain 30 tablet 0  . vitamin C (ASCORBIC ACID) 500 MG tablet Take 1,000 mg by mouth daily.      No current facility-administered medications for this visit.     REVIEW OF SYSTEMS:  [X]  denotes positive finding, [ ]  denotes negative finding Cardiac  Comments:  Chest pain or chest pressure:    Shortness of breath upon exertion:    Short of breath when lying flat:    Irregular heart rhythm:        Vascular    Pain in calf, thigh, or hip brought on by ambulation:    Pain in feet at night that wakes you up from your sleep:     Blood clot in your veins:    Leg swelling:         Pulmonary    Oxygen at home:    Productive cough:     Wheezing:         Neurologic      Sudden weakness in arms or legs:     Sudden numbness in arms or legs:     Sudden onset of difficulty speaking or slurred speech:    Temporary loss of vision in one eye:     Problems with dizziness:         Gastrointestinal    Blood in stool:     Vomited blood:         Genitourinary    Burning when urinating:     Blood in urine:        Psychiatric    Major depression:         Hematologic    Bleeding problems:    Problems with blood clotting too easily:        Skin    Rashes or ulcers:        Constitutional    Fever or chills:     PHYSICAL EXAM:   Vitals:   04/08/17 0929  BP: 137/67  Pulse: 70  Resp: 20  Temp: 97.2 F (36.2 C)  TempSrc: Oral  SpO2: 94%  Weight: 255 lb (115.7 kg)  Height: 5\' 9"  (1.753 m)    GENERAL: The patient is a well-nourished male, in no acute distress. The vital signs are documented above. CARDIAC: There is a regular rate and rhythm.  VASCULAR: I do not detect carotid bruits. He has palpable femoral and popliteal pulses bilaterally. I cannot palpate pedal pulses. He has hyperpigmentation consistent with chronic venous insufficiency. PULMONARY: There is good air exchange bilaterally without wheezing or rales. SKIN: The the wounds on his feet have healed. PSYCHIATRIC: The patient has a normal affect.  DATA:   No new data.  MEDICAL ISSUES:   PERIPHERAL VASCULAR DISEASE: The wounds on his feet have now healed. I have encouraged him to stay as active as possible and ambulate as much as he can with his walker. Fortunately, he is not a smoker. We have also discussed the importance of nutrition. I'll be happy to see him back in anytime if any new vascular issues arise. We've also discussed the importance of intermittent leg elevation given his chronic venous insufficiency.  Deitra Mayo [facility] [phone]

## 2017-04-09 ENCOUNTER — Encounter: Payer: Self-pay | Admitting: Physician Assistant

## 2017-04-09 ENCOUNTER — Ambulatory Visit (INDEPENDENT_AMBULATORY_CARE_PROVIDER_SITE_OTHER): Payer: Medicare Other | Admitting: Physician Assistant

## 2017-04-09 VITALS — BP 140/74 | HR 87 | Temp 98.1°F | Resp 18 | Ht 69.0 in | Wt 254.0 lb

## 2017-04-09 DIAGNOSIS — C642 Malignant neoplasm of left kidney, except renal pelvis: Secondary | ICD-10-CM | POA: Diagnosis not present

## 2017-04-09 DIAGNOSIS — L97513 Non-pressure chronic ulcer of other part of right foot with necrosis of muscle: Secondary | ICD-10-CM

## 2017-04-09 DIAGNOSIS — I10 Essential (primary) hypertension: Secondary | ICD-10-CM

## 2017-04-09 DIAGNOSIS — I700 Atherosclerosis of aorta: Secondary | ICD-10-CM

## 2017-04-09 DIAGNOSIS — N184 Chronic kidney disease, stage 4 (severe): Secondary | ICD-10-CM

## 2017-04-09 DIAGNOSIS — E782 Mixed hyperlipidemia: Secondary | ICD-10-CM | POA: Diagnosis not present

## 2017-04-09 DIAGNOSIS — R7309 Other abnormal glucose: Secondary | ICD-10-CM | POA: Diagnosis not present

## 2017-04-09 DIAGNOSIS — Z79899 Other long term (current) drug therapy: Secondary | ICD-10-CM | POA: Diagnosis not present

## 2017-04-09 DIAGNOSIS — E034 Atrophy of thyroid (acquired): Secondary | ICD-10-CM | POA: Diagnosis not present

## 2017-04-09 LAB — CBC WITH DIFFERENTIAL/PLATELET
BASOS PCT: 1 %
Basophils Absolute: 79 cells/uL (ref 0–200)
EOS PCT: 5 %
Eosinophils Absolute: 395 cells/uL (ref 15–500)
HCT: 34 % — ABNORMAL LOW (ref 38.5–50.0)
HEMOGLOBIN: 10.8 g/dL — AB (ref 13.2–17.1)
LYMPHS ABS: 1817 {cells}/uL (ref 850–3900)
Lymphocytes Relative: 23 %
MCH: 30.2 pg (ref 27.0–33.0)
MCHC: 31.8 g/dL — ABNORMAL LOW (ref 32.0–36.0)
MCV: 95 fL (ref 80.0–100.0)
MPV: 9.8 fL (ref 7.5–12.5)
Monocytes Absolute: 790 cells/uL (ref 200–950)
Monocytes Relative: 10 %
Neutro Abs: 4819 cells/uL (ref 1500–7800)
Neutrophils Relative %: 61 %
PLATELETS: 238 10*3/uL (ref 140–400)
RBC: 3.58 MIL/uL — AB (ref 4.20–5.80)
RDW: 15.5 % — ABNORMAL HIGH (ref 11.0–15.0)
WBC: 7.9 10*3/uL (ref 3.8–10.8)

## 2017-04-09 LAB — BASIC METABOLIC PANEL WITH GFR
BUN: 40 mg/dL — ABNORMAL HIGH (ref 7–25)
CO2: 25 mmol/L (ref 20–31)
Calcium: 8.8 mg/dL (ref 8.6–10.3)
Chloride: 102 mmol/L (ref 98–110)
Creat: 2.37 mg/dL — ABNORMAL HIGH (ref 0.70–1.11)
GFR, EST NON AFRICAN AMERICAN: 24 mL/min — AB (ref >=60)
GFR, Est African American: 28 mL/min — ABNORMAL LOW (ref >=60)
Glucose, Bld: 119 mg/dL — ABNORMAL HIGH (ref 65–99)
Potassium: 4.2 mmol/L (ref 3.5–5.3)
SODIUM: 138 mmol/L (ref 135–146)

## 2017-04-09 LAB — HEPATIC FUNCTION PANEL
ALT: 12 U/L (ref 9–46)
AST: 13 U/L (ref 10–35)
Albumin: 3.3 g/dL — ABNORMAL LOW (ref 3.6–5.1)
Alkaline Phosphatase: 81 U/L (ref 40–115)
BILIRUBIN DIRECT: 0.1 mg/dL (ref <=0.2)
BILIRUBIN INDIRECT: 0.3 mg/dL (ref 0.2–1.2)
BILIRUBIN TOTAL: 0.4 mg/dL (ref 0.2–1.2)
Total Protein: 6.7 g/dL (ref 6.1–8.1)

## 2017-04-09 LAB — LIPID PANEL
CHOL/HDL RATIO: 4.5 ratio (ref <5.0)
CHOLESTEROL: 157 mg/dL (ref <200)
HDL: 35 mg/dL — AB (ref >40)
LDL Cholesterol: 97 mg/dL (ref <100)
Triglycerides: 125 mg/dL (ref <150)
VLDL: 25 mg/dL (ref <30)

## 2017-04-09 LAB — TSH: TSH: 0.9 mIU/L (ref 0.40–4.50)

## 2017-04-09 LAB — MAGNESIUM: Magnesium: 2.5 mg/dL (ref 1.5–2.5)

## 2017-04-09 NOTE — Patient Instructions (Signed)

## 2017-04-09 NOTE — Progress Notes (Signed)
Patient ID: [name], male   DOB: 03/20/1929, 83 y.o.   MRN: [id number] Subjective: Patient presents today status post osteomyelitis resection with cellulitis to the right foot. Date of surgery was 09/08/2016. He states he is doing much better at this time and has no new complaints.  Objective: Complete reepithelialization has occurred. No open lesions noted.  Assessment:  #1 status post joint arthroplasty 1st MPJ right- DOS 09/08/17  Plan of care: #1 today patient was evaluated #2 healed #3 continue wearing good shoe gear   Edrick Kins, DPM [facility]  Dr. Edrick Kins, [address]                                        [place], [place] [address]                Office [phone]  Fax [phone]

## 2017-04-10 LAB — HEMOGLOBIN A1C
HEMOGLOBIN A1C: 6 % — AB (ref <5.7)
MEAN PLASMA GLUCOSE: 126 mg/dL

## 2017-04-10 LAB — VITAMIN B12: VITAMIN B 12: 963 pg/mL (ref 200–1100)

## 2017-04-15 LAB — ZINC: Zinc: 49 ug/dL — ABNORMAL LOW (ref 60–130)

## 2017-04-26 ENCOUNTER — Other Ambulatory Visit: Payer: Self-pay | Admitting: Internal Medicine

## 2017-04-26 DIAGNOSIS — G894 Chronic pain syndrome: Secondary | ICD-10-CM

## 2017-04-27 NOTE — Telephone Encounter (Signed)
Tramadol was called into pharmacy on 18th June 2018 @ 8:57am by DD

## 2017-05-16 ENCOUNTER — Other Ambulatory Visit: Payer: Self-pay | Admitting: Internal Medicine

## 2017-05-16 ENCOUNTER — Other Ambulatory Visit: Payer: Self-pay | Admitting: Physician Assistant

## 2017-05-16 DIAGNOSIS — G894 Chronic pain syndrome: Secondary | ICD-10-CM

## 2017-05-16 NOTE — Telephone Encounter (Signed)
please call Tramadol

## 2017-06-07 ENCOUNTER — Other Ambulatory Visit: Payer: Self-pay | Admitting: Physician Assistant

## 2017-06-07 ENCOUNTER — Other Ambulatory Visit: Payer: Self-pay | Admitting: Internal Medicine

## 2017-06-07 DIAGNOSIS — G894 Chronic pain syndrome: Secondary | ICD-10-CM

## 2017-06-08 ENCOUNTER — Ambulatory Visit (INDEPENDENT_AMBULATORY_CARE_PROVIDER_SITE_OTHER): Payer: Medicare Other | Admitting: Podiatry

## 2017-06-08 DIAGNOSIS — B351 Tinea unguium: Secondary | ICD-10-CM | POA: Diagnosis not present

## 2017-06-08 DIAGNOSIS — M79676 Pain in unspecified toe(s): Secondary | ICD-10-CM

## 2017-06-08 NOTE — Telephone Encounter (Signed)
Tramadol called into pharmacy on July 30th 2018 at 10am by dd

## 2017-06-08 NOTE — Progress Notes (Signed)
   SUBJECTIVE Patient  presents to office today complaining of elongated, thickened nails. Pain while ambulating in shoes. Patient is unable to trim their own nails.   OBJECTIVE General Patient is awake, alert, and oriented x 3 and in no acute distress. Derm Skin is dry and supple bilateral. Negative open lesions or macerations. Remaining integument unremarkable. Nails are tender, long, thickened and dystrophic with subungual debris, consistent with onychomycosis, 1-5 bilateral. No signs of infection noted. Vasc  DP and PT pedal pulses palpable bilaterally. Temperature gradient within normal limits.  Neuro Epicritic and protective threshold sensation diminished bilaterally.  Musculoskeletal Exam No symptomatic pedal deformities noted bilateral. Muscular strength within normal limits.  ASSESSMENT 1. Onychodystrophic nails 1-5 bilateral with hyperkeratosis of nails.  2. Onychomycosis of nail due to dermatophyte bilateral 3. Pain in foot bilateral  PLAN OF CARE 1. Patient evaluated today.  2. Instructed to maintain good pedal hygiene and foot care.  3. Mechanical debridement of nails 1-5 bilaterally performed using a nail nipper. Filed with dremel without incident.  4. Return to clinic in 3 mos.    Edrick Kins, DPM [facility]  Dr. Edrick Kins, [address]                                        [place], [place] [address]                Office [phone]  Fax [phone]

## 2017-06-27 ENCOUNTER — Other Ambulatory Visit: Payer: Self-pay | Admitting: Internal Medicine

## 2017-07-09 ENCOUNTER — Ambulatory Visit (INDEPENDENT_AMBULATORY_CARE_PROVIDER_SITE_OTHER): Payer: Medicare Other | Admitting: Internal Medicine

## 2017-07-09 VITALS — BP 136/78 | HR 68 | Temp 97.3°F | Resp 18 | Ht 69.0 in

## 2017-07-09 DIAGNOSIS — M1000 Idiopathic gout, unspecified site: Secondary | ICD-10-CM | POA: Diagnosis not present

## 2017-07-09 DIAGNOSIS — E034 Atrophy of thyroid (acquired): Secondary | ICD-10-CM | POA: Diagnosis not present

## 2017-07-09 DIAGNOSIS — K219 Gastro-esophageal reflux disease without esophagitis: Secondary | ICD-10-CM | POA: Diagnosis not present

## 2017-07-09 DIAGNOSIS — N184 Chronic kidney disease, stage 4 (severe): Secondary | ICD-10-CM

## 2017-07-09 DIAGNOSIS — I2510 Atherosclerotic heart disease of native coronary artery without angina pectoris: Secondary | ICD-10-CM | POA: Diagnosis not present

## 2017-07-09 DIAGNOSIS — E559 Vitamin D deficiency, unspecified: Secondary | ICD-10-CM | POA: Diagnosis not present

## 2017-07-09 DIAGNOSIS — E782 Mixed hyperlipidemia: Secondary | ICD-10-CM

## 2017-07-09 DIAGNOSIS — Z79899 Other long term (current) drug therapy: Secondary | ICD-10-CM

## 2017-07-09 DIAGNOSIS — R7303 Prediabetes: Secondary | ICD-10-CM | POA: Diagnosis not present

## 2017-07-09 DIAGNOSIS — I10 Essential (primary) hypertension: Secondary | ICD-10-CM

## 2017-07-09 NOTE — Patient Instructions (Signed)

## 2017-07-09 NOTE — Progress Notes (Signed)
This very nice 83 y.o. WWM presents for 3 month follow up with Hypertension, ASCAD/Stents, CKD4, Gout,  Hyperlipidemia, Pre-Diabetes and Vitamin D Deficiency. Patient is s/p Rt Nephrectomy for Kidney Cancer in 2013.  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      Patient is treated for HTN (1997)  & BP has been controlled at home. Patient has HT CKD4 . Patient adamantly refuses to consider Dialysis.  In 2009 , he had PCA/Stenting. Today's BP was elevated and normalized on recheck - 136/78. Patient has had no complaints of any cardiac type chest pain, palpitations, dyspnea/orthopnea/PND, dizziness, claudication, or dependent edema.     Hyperlipidemia is controlled with diet & meds. Patient denies myalgias or other med SE's. Last Lipids were at goal: Lab Results  Component Value Date   CHOL 160 07/09/2017   HDL 44 07/09/2017   LDLCALC 97 04/09/2017   TRIG 104 07/09/2017   CHOLHDL 3.6 07/09/2017      Also, the patient has history of  Morbid Obesity (BMI 38+) and PreDiabetes (A1c 6.0% in 2009 and 2017)  and has had no symptoms of reactive hypoglycemia, diabetic polys, paresthesias or visual blurring.  Last A1c was at goal: Lab Results  Component Value Date   HGBA1C 5.8 (H) 07/09/2017      Patient has Hypothyroidism predating circa 1990's. Further, the patient also has history of Vitamin D Deficiency ("24" in 2008)  and supplements vitamin D without any suspected side-effects. Last vitamin D was at goal:  Lab Results  Component Value Date   VD25OH 61 07/09/2017   Current Outpatient Prescriptions on File Prior to Visit  Medication Sig  . allopurinol (ZYLOPRIM) 300 MG tablet TAKE 1 TABLET DAILY  . amLODipine (NORVASC) 5 MG tablet   . aspirin EC 81 MG tablet Take 81 mg by mouth daily.  . bumetanide (BUMEX) 2 MG tablet TAKE ONE-HALF (1/2) TO ONE TABLET DAILY FOR FLUID  . Cholecalciferol (VITAMIN D) 2000 units tablet Take by mouth.   Lyndle Herrlich SULFATE PO Take 1 tablet by mouth 3 (three) times daily.   Marland Kitchen levothyroxine (SYNTHROID, LEVOTHROID) 150 MCG tablet TAKE 1 TABLET DAILY BUT TAKE ONE AND ONE-HALF TABLETS ON SUNDAY. TAKE 1 HOUR BEFORE FOOD, ONLY WITH WATER  . Magnesium 250 MG TABS Take 500 mg by mouth daily.   Marland Kitchen menthol-cetylpyridinium (CEPACOL) 3 MG lozenge Take 1 lozenge (3 mg total) by mouth as needed for sore throat.  . Multiple Vitamin (MULITIVITAMIN WITH MINERALS) TABS Take 1 tablet by mouth daily.  . nitroGLYCERIN (NITROSTAT) 0.4 MG SL tablet Place 0.4 mg under the tongue every 5 (five) minutes as needed for chest pain.   . pravastatin (PRAVACHOL) 40 MG tablet TAKE 1 TABLET AT BEDTIME  . traMADol (ULTRAM) 50 MG tablet TAKE 1/2 TO 1 TABLET BY MOUTH THREE TIMES A DAY ONLY IF NEEDED FOR SEVERE PAIN  . vitamin C (ASCORBIC ACID) 500 MG tablet Take 1,000 mg by mouth daily.    No current facility-administered medications on file prior to visit.    No Known Allergies PMHx:   Past Medical History:  Diagnosis Date  . Anemia   . Arthritis    stenosis - back, neck  . Cancer [facility])    renal neoplasm  . Chronic kidney disease    renalCa- nephrectomy [facility]) 04/2012  . Coronary artery disease   . GERD (gastroesophageal reflux disease)   . Hard  of hearing   . Hearing deficit    hearing aids- bilateral  . Hypertension    sees Dr. Wynonia Lawman, stress test recently for prep for surgery  . Hypothyroidism   . 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 Prediabetes   . Vitamin D deficiency    Immunization History  Administered Date(s) Administered  . Influenza Split 09/06/2013  . Influenza, High Dose Seasonal PF 07/20/2014, 08/20/2015, 07/24/2016  . PPD Test 09/12/2016  . Pneumococcal Conjugate-13 10/30/2014  . Pneumococcal Polysaccharide-23 02/18/2012  . Td 02/18/2012  . Zoster 03/03/2013   Past Surgical History:  Procedure Laterality Date  . ANTERIOR  CERVICAL DECOMP/DISCECTOMY FUSION N/A 01/18/2013   Procedure: ANTERIOR CERVICAL DECOMPRESSION/DISCECTOMY FUSION 2 LEVELS;  Surgeon: Faythe Ghee, MD;  Location: [place] [facility];  Service: Neurosurgery;  Laterality: N/A;  . APPENDECTOMY    . BACK SURGERY     lower back surgery - 1990's  . CHOLECYSTECTOMY    . CORONARY ANGIOPLASTY     stents , + 7-27yrs. ago  . EYE SURGERY     right eye cataract surgery   . I&D EXTREMITY Right 09/08/2016   Procedure: IRRIGATION AND DEBRIDEMENT RIGHT FOOT WITH ANTIBIOTIC BEADS;  Surgeon: Edrick Kins, DPM;  Location: [place];  Service: Podiatry;  Laterality: Right;  . JOINT REPLACEMENT     bilateral knee replacements   . LAPAROSCOPIC NEPHRECTOMY  04/29/2012   Procedure: LAPAROSCOPIC NEPHRECTOMY;  Surgeon: Dutch Gray, MD;  Location: [facility];  Service: Urology;  Laterality: Left;      . TOE ARTHROPLASTY Right 09/08/2016   Procedure: Arthroplasty of First MPJ RIGHT FOOT;  Surgeon: Edrick Kins, DPM;  Location: [place];  Service: Podiatry;  Laterality: Right;  . TONSILLECTOMY    . WOUND DEBRIDEMENT Right 09/08/2016   Procedure: DEBRIDEMENT RIGHT ANKLE WOUND;  Surgeon: Edrick Kins, DPM;  Location: [place];  Service: Podiatry;  Laterality: Right;   FHx:    Reviewed / unchanged  SHx:    Reviewed / unchanged  Systems Review:  Constitutional: Denies fever, chills, wt changes, headaches, insomnia, fatigue, night sweats, change in appetite. Eyes: Denies redness, blurred vision, diplopia, discharge, itchy, watery eyes.  ENT: Denies discharge, congestion, post nasal drip, epistaxis, sore throat, earache, hearing loss, dental pain, tinnitus, vertigo, sinus pain, snoring.  CV: Denies chest pain, palpitations, irregular heartbeat, syncope, dyspnea, diaphoresis, orthopnea, PND, claudication or edema. Respiratory: denies cough, dyspnea, DOE, pleurisy, hoarseness, laryngitis, wheezing.  Gastrointestinal: Denies dysphagia, odynophagia, heartburn, reflux, water brash, abdominal  pain or cramps, nausea, vomiting, bloating, diarrhea, constipation, hematemesis, melena, hematochezia  or hemorrhoids. Genitourinary: Denies dysuria, frequency, urgency, nocturia, hesitancy, discharge, hematuria or flank pain. Musculoskeletal: Denies arthralgias, myalgias, stiffness, jt. swelling, pain, limping or strain/sprain.  Skin: Denies pruritus, rash, hives, warts, acne, eczema or change in skin lesion(s). Neuro: No weakness, tremor, incoordination, spasms, paresthesia or pain. Psychiatric: Denies confusion, memory loss or sensory loss. Endo: Denies change in weight, skin or hair change.  Heme/Lymph: No excessive bleeding, bruising or enlarged lymph nodes.  Physical Exam  BP 136/78   Pulse 68   Temp (!) 97.3 F (36.3 C)   Resp 18   Ht 5\' 9"  (1.753 m)   Appears well nourished, well groomed  and in no distress.  Eyes: PERRLA, EOMs, conjunctiva no swelling or erythema. Sinuses: No frontal/maxillary tenderness ENT/Mouth: EAC's clear, TM's nl w/o erythema, bulging. Nares clear w/o erythema, swelling, exudates. Oropharynx  clear without erythema or exudates. Oral hygiene is good. Tongue normal, non obstructing. Hearing intact.  Neck: Supple. Thyroid nl. Car 2+/2+ without bruits, nodes or JVD. Chest: Respirations nl with BS clear & equal w/o rales, rhonchi, wheezing or stridor.  Cor: Heart sounds normal w/ regular rate and rhythm without sig. murmurs, gallops, clicks or rubs. Peripheral pulses normal and equal  without edema.  Abdomen: Soft & bowel sounds normal. Non-tender w/o guarding, rebound, hernias, masses or organomegaly.  Lymphatics: Unremarkable.  Musculoskeletal: Full ROM all peripheral extremities, joint stability, 5/5 strength and normal gait.  Skin: Warm, dry without exposed rashes, lesions or ecchymosis apparent.  Neuro: Cranial nerves intact, reflexes equal bilaterally. Sensory-motor testing grossly intact. Tendon reflexes grossly intact.  Pysch: Alert & oriented x 3.   Insight and judgement nl & appropriate. No ideations.  Assessment and Plan:  1. Essential hypertension  - Continue medication, monitor blood pressure at home.  - Continue DASH diet. Reminder to go to the ER if any CP,  SOB, nausea, dizziness, severe HA, changes vision/speech.  - CBC with Differential/Platelet - BASIC METABOLIC PANEL WITH GFR - Magnesium - TSH  2. Hyperlipidemia, mixed  - Continue diet/meds, exercise,& lifestyle modifications.  - Continue monitor periodic cholesterol/liver & renal functions   - Hepatic function panel - Lipid panel - TSH  3. Prediabetes  - Continue diet, exercise, lifestyle modifications.  - Monitor appropriate labs.  - Hemoglobin A1c - Insulin, fasting  4. Vitamin D deficiency  - Continue supplementation.  - VITAMIN D 25 Hydroxy  5. Hypothyroidism  - TSH  6. CKD, stage IV (GFR 28 ml/min) (HCC)   7. Coronary artery disease  - Lipid panel  8. Gastroesophageal reflux disease   9. Idiopathic gout  - Uric acid  10. Medication management  - CBC with Differential/Platelet - BASIC METABOLIC PANEL WITH GFR - Hepatic function panel - Magnesium - Lipid panel - TSH - Hemoglobin A1c - Insulin, fasting - VITAMIN D 25 Hydroxy  - Uric acid       Discussed  regular exercise, BP monitoring, weight control to achieve/maintain BMI less than 25 and discussed med and SE's. Recommended labs to assess and monitor clinical status with further disposition pending results of labs. Over 30 minutes of exam, counseling, chart review was performed.

## 2017-07-10 LAB — CBC WITH DIFFERENTIAL/PLATELET
BASOS ABS: 101 {cells}/uL (ref 0–200)
Basophils Relative: 1.2 %
Eosinophils Absolute: 412 cells/uL (ref 15–500)
Eosinophils Relative: 4.9 %
HEMATOCRIT: 32.1 % — AB (ref 38.5–50.0)
Hemoglobin: 10.4 g/dL — ABNORMAL LOW (ref 13.2–17.1)
LYMPHS ABS: 2050 {cells}/uL (ref 850–3900)
MCH: 30.2 pg (ref 27.0–33.0)
MCHC: 32.4 g/dL (ref 32.0–36.0)
MCV: 93.3 fL (ref 80.0–100.0)
MPV: 10.3 fL (ref 7.5–12.5)
Monocytes Relative: 10.1 %
NEUTROS PCT: 59.4 %
Neutro Abs: 4990 cells/uL (ref 1500–7800)
Platelets: 250 10*3/uL (ref 140–400)
RBC: 3.44 10*6/uL — ABNORMAL LOW (ref 4.20–5.80)
RDW: 14.4 % (ref 11.0–15.0)
TOTAL LYMPHOCYTE: 24.4 %
WBC mixed population: 848 cells/uL (ref 200–950)
WBC: 8.4 10*3/uL (ref 3.8–10.8)

## 2017-07-10 LAB — LIPID PANEL
CHOL/HDL RATIO: 3.6 (calc) (ref <5.0)
CHOLESTEROL: 160 mg/dL (ref <200)
HDL: 44 mg/dL (ref >40)
LDL CHOLESTEROL (CALC): 96 mg/dL
Non-HDL Cholesterol (Calc): 116 mg/dL (calc) (ref <130)
Triglycerides: 104 mg/dL (ref <150)

## 2017-07-10 LAB — BASIC METABOLIC PANEL WITH GFR
BUN / CREAT RATIO: 16 (calc) (ref 6–22)
BUN: 39 mg/dL — AB (ref 7–25)
CO2: 25 mmol/L (ref 20–32)
CREATININE: 2.39 mg/dL — AB (ref 0.70–1.11)
Calcium: 9.1 mg/dL (ref 8.6–10.3)
Chloride: 102 mmol/L (ref 98–110)
GFR, EST AFRICAN AMERICAN: 27 mL/min/{1.73_m2} — AB (ref >=60)
GFR, Est Non African American: 24 mL/min/{1.73_m2} — ABNORMAL LOW (ref >=60)
Glucose, Bld: 106 mg/dL — ABNORMAL HIGH (ref 65–99)
Potassium: 4.4 mmol/L (ref 3.5–5.3)
Sodium: 136 mmol/L (ref 135–146)

## 2017-07-10 LAB — HEPATIC FUNCTION PANEL
AG RATIO: 1.1 (calc) (ref 1.0–2.5)
ALT: 11 U/L (ref 9–46)
AST: 15 U/L (ref 10–35)
Albumin: 3.5 g/dL — ABNORMAL LOW (ref 3.6–5.1)
Alkaline phosphatase (APISO): 89 U/L (ref 40–115)
Bilirubin, Direct: 0.1 mg/dL (ref 0.0–0.2)
GLOBULIN: 3.3 g/dL (ref 1.9–3.7)
Indirect Bilirubin: 0.3 mg/dL (calc) (ref 0.2–1.2)
Total Bilirubin: 0.4 mg/dL (ref 0.2–1.2)
Total Protein: 6.8 g/dL (ref 6.1–8.1)

## 2017-07-10 LAB — VITAMIN D 25 HYDROXY (VIT D DEFICIENCY, FRACTURES): VIT D 25 HYDROXY: 61 ng/mL (ref 30–100)

## 2017-07-10 LAB — HEMOGLOBIN A1C
Hgb A1c MFr Bld: 5.8 % of total Hgb — ABNORMAL HIGH (ref <5.7)
Mean Plasma Glucose: 120 (calc)
eAG (mmol/L): 6.6 (calc)

## 2017-07-10 LAB — URIC ACID: URIC ACID, SERUM: 5.6 mg/dL (ref 4.0–8.0)

## 2017-07-10 LAB — INSULIN, FASTING: Insulin: 6.2 u[IU]/mL (ref 2.0–19.6)

## 2017-07-10 LAB — TSH: TSH: 2.41 mIU/L (ref 0.40–4.50)

## 2017-07-10 LAB — MAGNESIUM: Magnesium: 2.7 mg/dL — ABNORMAL HIGH (ref 1.5–2.5)

## 2017-07-11 ENCOUNTER — Encounter: Payer: Self-pay | Admitting: Internal Medicine

## 2017-07-31 ENCOUNTER — Other Ambulatory Visit (HOSPITAL_BASED_OUTPATIENT_CLINIC_OR_DEPARTMENT_OTHER): Payer: Medicare Other

## 2017-07-31 ENCOUNTER — Ambulatory Visit (HOSPITAL_COMMUNITY)
Admission: RE | Admit: 2017-07-31 | Discharge: 2017-07-31 | Disposition: A | Discharge status: Home / Self Care | Payer: Medicare Other | Source: Ambulatory Visit | Attending: Oncology | Admitting: Oncology

## 2017-07-31 DIAGNOSIS — I2510 Atherosclerotic heart disease of native coronary artery without angina pectoris: Secondary | ICD-10-CM | POA: Diagnosis not present

## 2017-07-31 DIAGNOSIS — Z905 Acquired absence of kidney: Secondary | ICD-10-CM | POA: Diagnosis not present

## 2017-07-31 DIAGNOSIS — R590 Localized enlarged lymph nodes: Secondary | ICD-10-CM | POA: Insufficient documentation

## 2017-07-31 DIAGNOSIS — C642 Malignant neoplasm of left kidney, except renal pelvis: Secondary | ICD-10-CM

## 2017-07-31 DIAGNOSIS — K429 Umbilical hernia without obstruction or gangrene: Secondary | ICD-10-CM | POA: Diagnosis not present

## 2017-07-31 DIAGNOSIS — I700 Atherosclerosis of aorta: Secondary | ICD-10-CM | POA: Diagnosis not present

## 2017-07-31 DIAGNOSIS — Z923 Personal history of irradiation: Secondary | ICD-10-CM | POA: Insufficient documentation

## 2017-07-31 DIAGNOSIS — M160 Bilateral primary osteoarthritis of hip: Secondary | ICD-10-CM | POA: Insufficient documentation

## 2017-07-31 DIAGNOSIS — N400 Enlarged prostate without lower urinary tract symptoms: Secondary | ICD-10-CM | POA: Insufficient documentation

## 2017-07-31 DIAGNOSIS — C7801 Secondary malignant neoplasm of right lung: Secondary | ICD-10-CM | POA: Diagnosis not present

## 2017-07-31 LAB — COMPREHENSIVE METABOLIC PANEL
ALBUMIN: 3.2 g/dL — AB (ref 3.5–5.0)
ALK PHOS: 98 U/L (ref 40–150)
ALT: 10 U/L (ref 0–55)
ANION GAP: 9 meq/L (ref 3–11)
AST: 16 U/L (ref 5–34)
BUN: 42.8 mg/dL — AB (ref 7.0–26.0)
CALCIUM: 9.1 mg/dL (ref 8.4–10.4)
CHLORIDE: 105 meq/L (ref 98–109)
CO2: 24 meq/L (ref 22–29)
CREATININE: 2.2 mg/dL — AB (ref 0.7–1.3)
EGFR: 26 mL/min/{1.73_m2} — ABNORMAL LOW (ref >90)
GLUCOSE: 115 mg/dL (ref 70–140)
Potassium: 4 mEq/L (ref 3.5–5.1)
Sodium: 137 mEq/L (ref 136–145)
Total Bilirubin: 0.4 mg/dL (ref 0.20–1.20)
Total Protein: 7.3 g/dL (ref 6.4–8.3)

## 2017-07-31 LAB — CBC WITH DIFFERENTIAL/PLATELET
BASO%: 0.9 % (ref 0.0–2.0)
BASOS ABS: 0.1 10*3/uL (ref 0.0–0.1)
EOS%: 4.8 % (ref 0.0–7.0)
Eosinophils Absolute: 0.4 10*3/uL (ref 0.0–0.5)
HEMATOCRIT: 33.1 % — AB (ref 38.4–49.9)
HEMOGLOBIN: 10.6 g/dL — AB (ref 13.0–17.1)
LYMPH#: 1.8 10*3/uL (ref 0.9–3.3)
LYMPH%: 23.5 % (ref 14.0–49.0)
MCH: 31.1 pg (ref 27.2–33.4)
MCHC: 32 g/dL (ref 32.0–36.0)
MCV: 97.1 fL (ref 79.3–98.0)
MONO#: 0.8 10*3/uL (ref 0.1–0.9)
MONO%: 9.9 % (ref 0.0–14.0)
NEUT#: 4.7 10*3/uL (ref 1.5–6.5)
NEUT%: 60.9 % (ref 39.0–75.0)
PLATELETS: 219 10*3/uL (ref 140–400)
RBC: 3.41 10*6/uL — ABNORMAL LOW (ref 4.20–5.82)
RDW: 15 % — AB (ref 11.0–14.6)
WBC: 7.7 10*3/uL (ref 4.0–10.3)

## 2017-08-05 ENCOUNTER — Telehealth: Payer: Self-pay | Admitting: Oncology

## 2017-08-05 ENCOUNTER — Ambulatory Visit (HOSPITAL_BASED_OUTPATIENT_CLINIC_OR_DEPARTMENT_OTHER): Payer: Medicare Other | Admitting: Oncology

## 2017-08-05 VITALS — BP 125/100 | HR 90 | Temp 97.5°F | Resp 18 | Ht 69.0 in | Wt 260.7 lb

## 2017-08-05 DIAGNOSIS — Z85528 Personal history of other malignant neoplasm of kidney: Secondary | ICD-10-CM

## 2017-08-05 DIAGNOSIS — Z23 Encounter for immunization: Secondary | ICD-10-CM | POA: Diagnosis not present

## 2017-08-05 DIAGNOSIS — C649 Malignant neoplasm of unspecified kidney, except renal pelvis: Secondary | ICD-10-CM

## 2017-08-05 MED ORDER — INFLUENZA VAC SPLIT QUAD 0.5 ML IM SUSY
0.5000 mL | PREFILLED_SYRINGE | Freq: Once | INTRAMUSCULAR | Status: AC
  Administered 2017-08-05: 0.5 mL via INTRAMUSCULAR
  Filled 2017-08-05: qty 0.5

## 2017-08-05 NOTE — Telephone Encounter (Signed)
Gave avs and calendar for march 2019 

## 2017-08-05 NOTE — Progress Notes (Signed)
Hematology and Oncology Follow Up Visit  [name] [id number] 1929/03/30 83 y.o. 08/05/2017 3:26 PM [name], MDMcKeown, [name], MD   Principle Diagnosis: 83 year old gentleman diagnosed with renal cell carcinoma in June 2013. He had a T2a clear cell histology after  presented with painless 9.6 x 9.2 x 7.8 cm left renal neoplasm with central necrosis suspicious for malignancy. He has documented pulmonary metastasis.   Prior Therapy:   He underwent a left laparoscopic radical nephrectomy on 04/29/2012 by Dr. Alinda Money. 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 The margins were negative of tumor without any evidence of local lymphadenopathy. The final pathological staging was T2 1 N0.  He is status post biopsy of right lower lung nodule obtained on 07/30/2015 which confirmed the presence of renal cell carcinoma.  He is status post radiation therapy to an isolated lung metastasis in the right lower lobe. He received total 54 gray in 3 fractions on 10/08/2015, 09/11/2015 and 09/14/2015.  Current therapy: Observation and surveillance.   Interim History: [name] presents today for a follow-up visit. Since the last visit, he reports no major changes in his health. He remains limited in his mobility utilizing a walker and wheelchair. He is not able to drive but it tends to most activities of daily living including living independently. He denied any recent falls or syncope. He denied any worsening respiratory complaints. He does report recent increase in his cough but no shortness of breath or dyspnea.   He does not report any headaches, blurry vision, syncope or seizures. He does not report any fevers, chills, sweats weight loss or appetite changes. He does not report any chest pain. He does not report any nausea, vomiting, abdominal pain, early satiety, change in his bowel habits or rectal bleeding. He does not report any  frequency, urgency, hesitancy or skeletal complaints. He does not report any lymphadenopathy or petechiae. He does report hearing deficits which is unchanged. and the remaining review of system is unremarkable  Medications: I have reviewed the patient's current medications.  Current Outpatient Prescriptions  Medication Sig Dispense Refill  . allopurinol (ZYLOPRIM) 300 MG tablet TAKE 1 TABLET DAILY 90 tablet 1  . amLODipine (NORVASC) 5 MG tablet     . aspirin EC 81 MG tablet Take 81 mg by mouth daily.    . bumetanide (BUMEX) 2 MG tablet TAKE ONE-HALF (1/2) TO ONE TABLET DAILY FOR FLUID 90 tablet 2  . Cholecalciferol (VITAMIN D) 2000 units tablet Take by mouth.    [name] Take 1 tablet by mouth 3 (three) times daily.     Marland Kitchen levothyroxine (SYNTHROID, LEVOTHROID) 150 MCG tablet TAKE 1 TABLET DAILY BUT TAKE ONE AND ONE-HALF TABLETS ON SUNDAY. TAKE 1 HOUR BEFORE FOOD, ONLY WITH WATER 115 tablet 1  . Magnesium 250 MG TABS Take 500 mg by mouth daily.     Marland Kitchen menthol-cetylpyridinium (CEPACOL) 3 MG lozenge Take 1 lozenge (3 mg total) by mouth as needed for sore throat. 100 tablet 12  . Multiple Vitamin (MULITIVITAMIN WITH MINERALS) TABS Take 1 tablet by mouth daily.    . nitroGLYCERIN (NITROSTAT) 0.4 MG SL tablet Place 0.4 mg under the tongue every 5 (five) minutes as needed for chest pain.     . pravastatin (PRAVACHOL) 40 MG tablet TAKE 1 TABLET AT BEDTIME 90 tablet 4  . traMADol (ULTRAM) 50 MG tablet TAKE 1/2 TO 1 TABLET BY MOUTH THREE  TIMES A DAY ONLY IF NEEDED FOR SEVERE PAIN 60 tablet 1  . vitamin C (ASCORBIC ACID) 500 MG tablet Take 1,000 mg by mouth daily.      Current Facility-Administered Medications  Medication Dose Route Frequency Provider Last Rate Last Dose  . Influenza vac split quadrivalent PF (FLUARIX) injection 0.5 mL  0.5 mL Intramuscular Once Nabor Thomann, Mathis Dad, MD         Allergies: No Known Allergies  Past Medical History, Surgical history, Social history, and Family  History were reviewed and updated.   Physical Exam: Blood pressure (!) 125/100, pulse 90, temperature (!) 97.5 F (36.4 C), temperature source Oral, resp. rate 18, height 5\' 9"  (1.753 m), weight 260 lb 11.2 oz (118.3 kg), SpO2 96 %. ECOG: 2 General appearance: Chronically ill-appearing gentleman without distress. Head: Normocephalic, without obvious abnormality no thrush or ulcers. Neck: no adenopathy or masses. Lymph nodes: Cervical, supraclavicular, and axillary nodes normal. Heart:regular rate and rhythm, S1, S2 normal, no murmur, click, rub or gallop Lung:chest clear, no wheezing, rales, normal symmetric air entry.  Abdomin: soft, non-tender, without masses or organomegaly no shifting dullness or ascites. EXT: Edema noted bilaterally. Erythema was also noted on both lower extremities.  Lab Results: Lab Results  Component Value Date   WBC 7.7 07/31/2017   HGB 10.6 (L) 07/31/2017   HCT 33.1 (L) 07/31/2017   MCV 97.1 07/31/2017   PLT 219 07/31/2017     Chemistry      Component Value Date/Time   NA 137 07/31/2017 1058   K 4.0 07/31/2017 1058   CL 102 07/09/2017 1242   CO2 24 07/31/2017 1058   BUN 42.8 (H) 07/31/2017 1058   CREATININE 2.2 (H) 07/31/2017 1058   GLU 111 09/12/2016      Component Value Date/Time   CALCIUM 9.1 07/31/2017 1058   ALKPHOS 98 07/31/2017 1058   AST 16 07/31/2017 1058   ALT 10 07/31/2017 1058   BILITOT 0.40 07/31/2017 1058      EXAM: CT CHEST, ABDOMEN AND PELVIS WITHOUT CONTRAST  TECHNIQUE: Multidetector CT imaging of the chest, abdomen and pelvis was performed following the standard protocol without IV contrast.  COMPARISON:  01/26/2017 CT chest, abdomen and pelvis.  FINDINGS: CT CHEST FINDINGS  Cardiovascular: Stable top-normal heart size. No significant pericardial fluid/thickening. Left main, left anterior descending, left circumflex and right coronary atherosclerosis. Atherosclerotic nonaneurysmal thoracic aorta. Stable  dilated main pulmonary artery (3.5 cm diameter).  Mediastinum/Nodes: No discrete thyroid nodules. Unremarkable esophagus. No pathologically enlarged axillary, mediastinal or gross hilar lymph nodes, noting limited sensitivity for the detection of hilar adenopathy on this noncontrast study.  Lungs/Pleura: No pneumothorax. No pleural effusion. Stable calcified 3 mm right middle lobe granuloma. Stable sharply marginated consolidation in the right lower lobe with associated volume loss and distortion, compatible with radiation fibrosis. No evidence of a recurrent right lower lobe pulmonary nodule. No acute consolidative airspace disease, lung masses or new significant pulmonary nodules.  Musculoskeletal: No aggressive appearing focal osseous lesions. Small endplate based sclerotic focus in the inferior T9 vertebral body is unchanged since 04/12/2012 and considered benign. Mild thoracic spondylosis. Stable mild gynecomastia, asymmetric to the right.  CT ABDOMEN PELVIS FINDINGS  Hepatobiliary: Normal liver with no liver mass. Cholecystectomy. Bile ducts are stable and within normal post cholecystectomy limits.  Pancreas: Normal, with no mass or duct dilation.  Spleen: Normal size. No mass.  Adrenals/Urinary Tract: No discrete adrenal nodules. No evidence of a mass or fluid collection in the left nephrectomy  bed. No right renal stones. No right hydronephrosis. No contour deforming right renal mass. Normal caliber right ureter, with no right ureteral stones. Stable chronic mild diffuse bladder wall thickening and trabeculation with tiny left anterior bladder wall diverticula. No bladder stones.  Stomach/Bowel: Small hiatal hernia. Otherwise nondistended and grossly normal stomach. Stable small umbilical hernia containing mid small bowel loop. No small bowel dilatation, wall thickening or pneumatosis. Appendix not discretely visualized. No pericecal inflammatory changes.  Normal large bowel with no diverticulosis, large bowel wall thickening or pericolonic fat stranding.  Vascular/Lymphatic: Atherosclerotic nonaneurysmal abdominal aorta. Stable bilateral external iliac lymphadenopathy measuring up to 1.4 cm on the right (series 2/ image 106) and 1.4 cm on the left (series 2/ image 110). No new pathologically enlarged abdominopelvic nodes. Stable nonspecific haziness of the central mesenteric fat without associated adenopathy, mass or fluid collection.  Reproductive: Stable mildly enlarged prostate.  Other: No pneumoperitoneum, ascites or focal fluid collection.  Musculoskeletal: No aggressive appearing focal osseous lesions. Marked lumbar spondylosis. Advanced osteoarthritis in both hips.  IMPRESSION: 1. No evidence of metastatic disease in the chest. Stable right lower lobe radiation fibrosis with no locally recurrent pulmonary metastasis. 2. No evidence of local tumor recurrence in the left nephrectomy bed . 3. No definite findings of metastatic disease in the abdomen or pelvis. Stable mild bilateral external iliac lymphadenopathy is nonspecific and more likely reactive. 4. Additional findings include stable small umbilical hernia containing a small bowel loop without acute bowel complication, Aortic Atherosclerosis (ICD10-I70.0), coronary atherosclerosis, small hiatal hernia1 and chronic diffuse bladder wall thickening and trabeculation probably due to chronic bladder outlet obstruction by the mildly enlarged prostate.    Impression and Plan:  83 year old gentleman with the following issues:  1. Renal cell carcinoma diagnosed in June 2013. He presented with a 9 cm mass and underwent a radical nephrectomy on June 2013. The final pathology showed clear cell renal cell carcinoma with the final pathological staging of T2a without any lymph node involvement. He remained disease-free with imaging studies up to August 2016. Now has stage IV  disease.  He is status post radiation therapy to an isolated pulmonary metastasis completed in November 2016.   CT scan obtained on 07/31/2017 was reviewed today and showed no evidence of recurrent disease.  I recommended continued active surveillance with imaging studies every 6 months. He is agreeable to proceed with this plan.    2. 17 mm right lower lung nodule discovered on a CT scan on August 2016. This is biopsy proven to be metastatic renal cell carcinoma. Status post ablative therapy by radiation completed in November 2016 positive response to therapy.  Repeat CT scan in September 2018 continue to show positive response to therapy.  3. Follow-up: Will be in 6 months and repeat imaging studies.     Zola Button, MD 9/26/20183:26 PM

## 2017-08-24 ENCOUNTER — Other Ambulatory Visit: Payer: Self-pay | Admitting: Internal Medicine

## 2017-08-24 DIAGNOSIS — I2510 Atherosclerotic heart disease of native coronary artery without angina pectoris: Secondary | ICD-10-CM

## 2017-08-24 DIAGNOSIS — I10 Essential (primary) hypertension: Secondary | ICD-10-CM

## 2017-09-04 ENCOUNTER — Ambulatory Visit (INDEPENDENT_AMBULATORY_CARE_PROVIDER_SITE_OTHER): Payer: Medicare Other | Admitting: Internal Medicine

## 2017-09-04 VITALS — BP 140/68 | HR 76 | Temp 97.7°F | Resp 20

## 2017-09-04 DIAGNOSIS — L03115 Cellulitis of right lower limb: Secondary | ICD-10-CM

## 2017-09-04 DIAGNOSIS — L97919 Non-pressure chronic ulcer of unspecified part of right lower leg with unspecified severity: Secondary | ICD-10-CM | POA: Diagnosis not present

## 2017-09-04 DIAGNOSIS — R5381 Other malaise: Secondary | ICD-10-CM

## 2017-09-04 DIAGNOSIS — R2681 Unsteadiness on feet: Secondary | ICD-10-CM | POA: Diagnosis not present

## 2017-09-04 DIAGNOSIS — L089 Local infection of the skin and subcutaneous tissue, unspecified: Secondary | ICD-10-CM

## 2017-09-04 MED ORDER — CEPHALEXIN 500 MG PO CAPS: ORAL_CAPSULE | ORAL | 1 refills | Status: AC

## 2017-09-05 ENCOUNTER — Encounter: Payer: Self-pay | Admitting: Internal Medicine

## 2017-09-05 NOTE — Progress Notes (Signed)
[facility]   Unk Pinto, M.D.    [name]. [name], P.A.-C      [name], [address]                [place], [place] [address] Telephone [phone] Telefax [phone]  Subjective:    Patient ID: [name], male    DOB: 27-Aug-1929, 83 y.o.   MRN: [id number]  HPI   This 83 yo debilitated  WWM with multiple co-morbidities including HTN, HLD, ASHD/Stents, CKD4, Gout, AS PVD, Hx/o Kidney Cancer and Metastatic Lung Cancer is brought in today by his caretaker daughter to evaluate a non healing wound of the Rt Shin. Patient is dependent in his personal care needs and requires moderate assistance for ADL's.      Medication Sig  . allopurinol  300 MG tablet TAKE 1 TABLET DAILY  . amLODipine ) 5 MG tablet TAKE 1 TABLET DAILY BEFORE BREAKFAST  . aspirin EC 81 MG tablet Take 81 mg by mouth daily.  . bumetanide  2 MG tablet TAKE 1/2-1 TAB DAILY FOR FLUID  . VITAMIN D 2000 units tablet Take by mouth.  Lyndle Herrlich SULFATE PO Take 1 tablet by mouth 3 (three) times daily.   Marland Kitchen levothyroxine  150 MCG tablet TAKE 1 TAB DAILY & 1.5 TAB on SUNDAY.   . Magnesium 250 MG TABS Take 500 mg by mouth daily.   Marland Kitchen menthol-cetylpyridinium (CEPACOL) 3 MG lozenge Take 1 lozenge (3 mg total) by mouth as needed for sore throat.  . Multi-Vit w/Minerals Take 1 tablet by mouth daily.  Marland Kitchen NITROSTAT 0.4 MG SL  as needed  . pravastatin  40 MG tablet TAKE 1 TABLET AT BEDTIME  . traMADol  50 MG tablet TAKE 1/2 TO 1 TABLET BY MOUTH THREE TIMES A DAY ONLY IF NEEDED FOR SEVERE PAIN  . vitamin C  500 MG tablet Take 1,000 mg by mouth daily.    No Known Allergies   Past Medical History:  Diagnosis Date  . Anemia   . Arthritis    stenosis - back, neck  . Cancer [facility])    renal neoplasm  . Chronic kidney disease    renalCa- nephrectomy [facility]) 04/2012  . Coronary artery disease   . GERD (gastroesophageal reflux  disease)   . Hard of hearing   . Hearing deficit    hearing aids- bilateral  . Hypertension    sees Dr. Wynonia Lawman, stress test recently for prep for surgery  . Hypothyroidism   . Lung cancer ([place])    right lower lobe nodule suspicious for metastatic clear cell renal cell neoplasm  . Prediabetes   . Vitamin D deficiency    Past Surgical History:  Procedure Laterality Date  . ANTERIOR CERVICAL DECOMP/DISCECTOMY FUSION N/A 01/18/2013   Procedure: ANTERIOR CERVICAL DECOMPRESSION/DISCECTOMY FUSION 2 LEVELS;  Surgeon: Faythe Ghee, MD;  Location: [facility];  Service: Neurosurgery;  Laterality: N/A;  . APPENDECTOMY    . BACK SURGERY     lower back surgery - 1990's  . CHOLECYSTECTOMY    . CORONARY ANGIOPLASTY     stents , + 7-58yrs. ago  . EYE SURGERY     right eye cataract surgery   . I&D EXTREMITY Right 09/08/2016   Procedure: IRRIGATION AND DEBRIDEMENT RIGHT FOOT WITH ANTIBIOTIC BEADS;  Surgeon: Edrick Kins, DPM;  Location: [facility];  Service: Podiatry;  Laterality: Right;  . JOINT REPLACEMENT     bilateral knee replacements   . LAPAROSCOPIC NEPHRECTOMY  04/29/2012   Procedure: LAPAROSCOPIC NEPHRECTOMY;  Surgeon: Dutch Gray, MD;  Location: [facility];  Service: Urology;  Laterality: Left;      . TOE ARTHROPLASTY Right 09/08/2016   Procedure: Arthroplasty of First MPJ RIGHT FOOT;  Surgeon: Edrick Kins, DPM;  Location: [facility];  Service: Podiatry;  Laterality: Right;  . TONSILLECTOMY    . WOUND DEBRIDEMENT Right 09/08/2016   Procedure: DEBRIDEMENT RIGHT ANKLE WOUND;  Surgeon: Edrick Kins, DPM;  Location: [facility];  Service: Podiatry;  Laterality: Right;   Review of Systems  10 point systems review negative except as above.    Objective:   Physical Exam  BP 140/68   Pulse 76   Temp 97.7 F (36.5 C)   Resp 20   Over nourished. In no acute Distress. No rash, cyanosis , icterus.   HEENT - WNL. Neck - supple.  Chest - Clear equal BS. Cor - Distant HS. RRR w/o sig MGR. PP 1(+).  1-2+ ankle/pedal edema. MS- ROM w/o deformities.  In Wheelchair Abd - Soft. Rotund. No point tenderness. Skin - 3" x 4"  Stage 3/4 wound of the Rt mid shin. With surrounding cellulitic changes . No lymphangitis is evident Neuro -  Nl w/o focal abnormalities. Mentally dulled.     Assessment & Plan:   1. Infected traumatic leg ulcer, right, with unspecified severity ([place])  - cephALEXin (KEFLEX) 500 MG capsule; Take 1 capsule 3  x/day after meals for infection  Dispense: 90 capsule; Refill: 1  2. Cellulitis of right leg  - cephALEXin (KEFLEX) 500 MG capsule; Take 1 capsule 3  x/day after meals for infection  Dispense: 90 capsule; Refill: 1  - Discussed wound care with daughter to apply a poultice of Betadine sugar & cover with an occlusive dressing daily  - Discussed with Daughter a  Home health consult for general assessment  for LPT and wound care

## 2017-09-06 ENCOUNTER — Encounter: Payer: Self-pay | Admitting: Internal Medicine

## 2017-09-07 ENCOUNTER — Ambulatory Visit (INDEPENDENT_AMBULATORY_CARE_PROVIDER_SITE_OTHER): Payer: Medicare Other | Admitting: Podiatry

## 2017-09-07 DIAGNOSIS — B351 Tinea unguium: Secondary | ICD-10-CM

## 2017-09-07 DIAGNOSIS — I83015 Varicose veins of right lower extremity with ulcer other part of foot: Secondary | ICD-10-CM

## 2017-09-07 DIAGNOSIS — L97912 Non-pressure chronic ulcer of unspecified part of right lower leg with fat layer exposed: Secondary | ICD-10-CM

## 2017-09-07 DIAGNOSIS — L97519 Non-pressure chronic ulcer of other part of right foot with unspecified severity: Secondary | ICD-10-CM

## 2017-09-07 DIAGNOSIS — M79676 Pain in unspecified toe(s): Secondary | ICD-10-CM | POA: Diagnosis not present

## 2017-09-07 MED ORDER — GENTAMICIN SULFATE 0.1 % EX CREA: 1.0000 "application " | TOPICAL_CREAM | Freq: Three times a day (TID) | CUTANEOUS | 1 refills | Status: DC

## 2017-09-09 NOTE — Progress Notes (Signed)
    Subjective: Patient is a 83 y.o. male presenting to the office today with a chief complaint of a painful lesions to the RLE that have been present for approximately four weeks. He states he initially injured the leg on a wheelchair. His PCP prescribed antibiotics yesterday which he reports taking as directed.   Patient also complains of elongated, thickened nails that cause pain while ambulating in shoes. Patient is unable to trim their own nails. Patient presents today for further treatment and evaluation.  Past Medical History:  Diagnosis Date  . Anemia   . Arthritis    stenosis - back, neck  . Cancer [facility])    renal neoplasm  . Chronic kidney disease    renalCa- nephrectomy [facility]) 04/2012  . Coronary artery disease   . GERD (gastroesophageal reflux disease)   . Hard of hearing   . Hearing deficit    hearing aids- bilateral  . Hypertension    sees Dr. Wynonia Lawman, stress test recently for prep for surgery  . Hypothyroidism   . Lung cancer ([place])    right lower lobe nodule suspicious for metastatic clear cell renal cell neoplasm  . Prediabetes   . Vitamin D deficiency     Objective:  Physical Exam General: Alert and oriented x3 in no acute distress  Dermatology:  Three separate wounds noted to the RLE all measuring approximately 2.5 x 0.8 x 0.1 cm (LxWxD).   To the noted ulceration(s), there is no eschar. There is a moderate amount of slough, fibrin, and necrotic tissue noted. Granulation tissue and wound base is red. There is a minimal amount of serosanguineous drainage noted. There is no exposed bone muscle-tendon ligament or joint. There is no malodor. Periwound integrity is intact. Skin is warm, dry and supple bilateral lower extremities. Nails are tender, long, thickened and dystrophic with subungual debris, consistent with onychomycosis, 1-5 bilateral. No signs of infection noted.  Vascular: Palpable pedal pulses bilaterally. No edema or erythema noted. Capillary refill  within normal limits.  Neurological: Epicritic and protective threshold grossly intact bilaterally.   Musculoskeletal Exam: Range of motion within normal limits bilateral. Muscle strength 5/5 in all groups bilateral.  Assessment: 1. RLE ulcers secondary to venous insufficiency  2. Onychodystrophic nails 1-5 bilateral with hyperkeratosis of nails.  3. Onychomycosis of nail due to dermatophyte bilateral    Plan of Care:  #1 Patient evaluated. #2 medically necessary excisional debridement including subcutaneous tissue was performed using a tissue nipper and a chisel blade. Excisional debridement of all the necrotic nonviable tissue down to healthy bleeding viable tissue was performed with post-debridement measurements same as pre-. #3 the wound was cleansed and dry sterile dressing applied. #4 Prescription for Gentamycin cream given to patient. #5 Recommended daily use of Gentamycin cream with a Band-Aid.  #6 Return to clinic in 3 weeks.  Going to [place] for a week.   Edrick Kins, DPM [facility]  Dr. Edrick Kins, [address]                                        [place], [place] [address]                Office [phone]  Fax [phone]

## 2017-09-23 ENCOUNTER — Encounter: Payer: Self-pay | Admitting: Internal Medicine

## 2017-09-23 ENCOUNTER — Telehealth: Payer: Self-pay | Admitting: Internal Medicine

## 2017-09-23 ENCOUNTER — Ambulatory Visit: Payer: Medicare Other | Admitting: Internal Medicine

## 2017-09-23 DIAGNOSIS — R460 Very low level of personal hygiene: Secondary | ICD-10-CM

## 2017-09-23 DIAGNOSIS — Z741 Need for assistance with personal care: Secondary | ICD-10-CM

## 2017-09-23 NOTE — Progress Notes (Signed)
       Patient's caretaker  daughter presented to consult re:  her father's inability to accomplish self hygiene / care as wiping bathing after BM's.      Will request their Homehealth for aide for personal hygiene.

## 2017-09-23 NOTE — Telephone Encounter (Signed)
Per Dr Unk Pinto, gave verbal request for Home Health to bath/shower 2-3 times weekly for order and hygiene , concensation in the pelvic and buttock  area. Patient unable to accomplisth with out assistance. [name] accepted verbal order.

## 2017-09-26 ENCOUNTER — Other Ambulatory Visit: Payer: Self-pay | Admitting: Internal Medicine

## 2017-09-26 ENCOUNTER — Other Ambulatory Visit: Payer: Self-pay | Admitting: Physician Assistant

## 2017-09-26 DIAGNOSIS — G894 Chronic pain syndrome: Secondary | ICD-10-CM

## 2017-09-28 ENCOUNTER — Encounter: Payer: Self-pay | Admitting: Podiatry

## 2017-09-28 ENCOUNTER — Ambulatory Visit (INDEPENDENT_AMBULATORY_CARE_PROVIDER_SITE_OTHER): Payer: Medicare Other | Admitting: Podiatry

## 2017-09-28 DIAGNOSIS — I83015 Varicose veins of right lower extremity with ulcer other part of foot: Secondary | ICD-10-CM | POA: Diagnosis not present

## 2017-09-28 DIAGNOSIS — L97519 Non-pressure chronic ulcer of other part of right foot with unspecified severity: Secondary | ICD-10-CM

## 2017-09-28 DIAGNOSIS — L97912 Non-pressure chronic ulcer of unspecified part of right lower leg with fat layer exposed: Secondary | ICD-10-CM

## 2017-10-04 NOTE — Progress Notes (Signed)
    Subjective: Patient is a 83 y.o. male presenting to the office today for follow up evaluation of ulcerations of the RLE secondary to venous insufficiencies. He states he finished a 90 day course of Cephalexin a few days ago prescribed by his PCP. He states his right leg is doing well and has no new complaints at this time. Patient presents today for further treatment and evaluation.   Past Medical History:  Diagnosis Date  . Anemia   . Arthritis    stenosis - back, neck  . Cancer [facility])    renal neoplasm  . Chronic kidney disease    renalCa- nephrectomy [facility]) 04/2012  . Coronary artery disease   . GERD (gastroesophageal reflux disease)   . Hard of hearing   . Hearing deficit    hearing aids- bilateral  . Hypertension    sees Dr. Wynonia Lawman, stress test recently for prep for surgery  . Hypothyroidism   . Lung cancer ([place])    right lower lobe nodule suspicious for metastatic clear cell renal cell neoplasm  . Prediabetes   . Vitamin D deficiency     Objective:  Physical Exam General: Alert and oriented x3 in no acute distress  Dermatology: Wounds noted to the RLE have healed. Complete re-epithelialization has occurred. No drainage noted.   Vascular: Palpable pedal pulses bilaterally. No edema or erythema noted. Capillary refill within normal limits.  Neurological: Epicritic and protective threshold grossly intact bilaterally.   Musculoskeletal Exam: Range of motion within normal limits bilateral. Muscle strength 5/5 in all groups bilateral.  Assessment: 1. RLE ulcers secondary to venous insufficiency - healed   Plan of Care:  #1 Patient evaluated. #2 Recommended daily moisturizer. #3 Recommended good shoe gear.  #4 Return to clinic when necessary.    Edrick Kins, DPM [facility]  Dr. Edrick Kins, [address]                                        [place], [place] [address]                Office [phone]  Fax [phone]

## 2017-10-09 ENCOUNTER — Encounter: Payer: Self-pay | Admitting: Internal Medicine

## 2017-10-15 ENCOUNTER — Ambulatory Visit: Payer: Self-pay | Admitting: Adult Health

## 2017-10-20 ENCOUNTER — Ambulatory Visit: Payer: Self-pay | Admitting: Adult Health

## 2017-11-10 NOTE — Progress Notes (Signed)
MEDICARE ANNUAL WELLNESS VISIT AND FOLLOW UP Assessment:   Diagnoses and all orders for this visit:  Medicare annual wellness visit, subsequent  Coronary artery disease involving native coronary artery of native heart without angina pectoris Has nitroglycerine; has never used Control blood pressure, cholesterol, glucose, increase exercise.  Continue follow up with cardiology  Atherosclerosis of aorta ([place]) Control blood pressure, cholesterol, glucose, increase exercise.   Essential hypertension At goal; Continue medication Monitor blood pressure at home; call if consistently over 130/80 Continue DASH diet.   Reminder to go to the ER if any CP, SOB, nausea, dizziness, severe HA, changes vision/speech, left arm numbness and tingling and jaw pain.  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 Alen Blew with q6 month CT  Gastroesophageal reflux disease, esophagitis presence not specified Well managed on current medications Discussed diet, avoiding triggers and other lifestyle changes  Hypothyroidism due to acquired atrophy of thyroid continue medications the same reminded to take on an empty stomach 30-86mins before food.  -     TSH  DJD     Treated by tramadol; referral to pain management has been discussed. Pain reportedly significantly improved and manageable now that he is confined to wheelchair.   Malignant neoplasm of kidney, unspecified laterality ([place])      Continue follow up with Dr. Alen Blew  CKD, stage IV (GFR 28 ml/min) ([place]) He was previously established with Dr. Moshe Cipro but has not followed up in quite some time; will refer back to reestablish due to advanced kidney disease and status of only 1 kidney Increase fluids, avoid NSAIDS, monitor sugars, will monitor -     BASIC METABOLIC PANEL WITH GFR  Vitamin D deficiency Continue supplementation Check vitamin D level  Abnormal blood sugar Discussed disease  and risks of elevated glucose Discussed diet/exercise, weight management  -     Hemoglobin A1c  Hyperlipidemia At goal; continue statin -  Continue low cholesterol diet and exercise.  -     Lipid panel  Medication management -     CBC with Differential/Platelet -     BASIC METABOLIC PANEL WITH GFR -     Hepatic function panel  Morbid obesity (HCC) Recommended diet heavy in fruits and veggies and low in animal meats, cheeses, and dairy products, appropriate calorie intake Recommend daily exercise as tolerated Follow up at next visit  Spinal stenosis, unspecified spinal region       Managed by tramadol; pain management has been discussed  Idiopathic gout, unspecified chronicity, unspecified site Continue allopurinol Diet discussed Check uric acid as needed  Chronic pain syndrome       Limited to wheelchair due to severe bilateral knee pain; prescribed tramadol; pain management has been discussed .  Iron deficiency anemia secondary to inadequate dietary iron intake -     CBC with Differential/Platelet  Cellulitis of left lower extremity Keflex 500 mg TID x 30 days Monitor for worsening signs of infection Call office if worsening  Over 30 minutes of exam, counseling, chart review, and critical decision making was performed  Future Appointments  Date Time Provider [place]  12/07/2017  2:15 PM Edrick Kins, DPM [facility]  01/26/2018 11:00 AM Unk Pinto, MD [facility] None  01/29/2018 11:30 AM [facility] None  01/29/2018 12:30 PM [facility] [place]  01/29/2018  1:15 PM Wyatt Portela, MD [facility] None     Plan:   During the course of the visit the  patient was educated and counseled about appropriate screening and preventive services including:    Pneumococcal vaccine   Influenza vaccine  Prevnar 13  Td vaccine  Screening electrocardiogram  Colorectal cancer screening  Diabetes screening  Glaucoma  screening  Nutrition counseling    Subjective:  [name] is a 84 y.o. Caucasian male who presents accompanied by his daughter for Medicare Annual Wellness Visit and 3 month follow up for HTN, ASCAD/stends, CKD 4, hyperlipidemia, prediabetes, hypothyroid and vitamin D Def.  Patient is s/p Rt Nephrectomy for Kidney Cancer in 2013.  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 He has chronic ongoing pain of bilateral knees; s/p bilateral TKA with multiple revisions by Dr. Percell Miller, as well as spinal stenosis. He is prescribed tramadol at this time; pain management has been discussed with him.   He is followed by home PT twice weekly as well as home health aid for hygiene needs.   His blood pressure has been controlled at home, today their BP is BP: 134/64 He does not workout. He denies chest pain, shortness of breath, dizziness.   He is on cholesterol medication and denies myalgias. His cholesterol is at goal. The cholesterol last visit was:  Lab Results  Component Value Date   CHOL 160 07/09/2017   HDL 44 07/09/2017   LDLCALC 97 04/09/2017   TRIG 104 07/09/2017   CHOLHDL 3.6 07/09/2017   He has not been working on diet and exercise for prediabetes, and denies increased appetite, nausea, paresthesia of the feet, polydipsia, polyuria, visual disturbances and vomiting. Last A1C in the office was:  Lab Results  Component Value Date   HGBA1C 5.8 (H) 07/09/2017   Last GFR Lab Results  Component Value Date   GFRNONAA 24 (L) 07/09/2017   He is on thyroid medication. His medication was not changed last visit.   Lab Results  Component Value Date   TSH 2.41 07/09/2017    Patient is on Vitamin D supplement and near goal of 70 at last check:    Lab Results  Component Value Date   VD25OH 77 07/09/2017    He is on thyroid medication. His medication was not changed last visit.   Lab Results  Component Value Date   TSH 2.41  07/09/2017   Patient is on allopurinol for gout and does not report a recent flare.  Lab Results  Component Value Date   LABURIC 5.6 07/09/2017     Medication Review: Current Outpatient Medications on File Prior to Visit  Medication Sig Dispense Refill  . allopurinol (ZYLOPRIM) 300 MG tablet TAKE 1 TABLET DAILY 90 tablet 1  . amLODipine (NORVASC) 5 MG tablet TAKE 1 TABLET DAILY BEFORE BREAKFAST 90 tablet 3  . aspirin EC 81 MG tablet Take 81 mg by mouth daily.    . bumetanide (BUMEX) 2 MG tablet TAKE ONE-HALF (1/2) TO ONE TABLET DAILY FOR FLUID 90 tablet 2  . Cholecalciferol (VITAMIN D) 2000 units tablet Take by mouth.    Lyndle Herrlich SULFATE PO Take 1 tablet by mouth 3 (three) times daily.     Marland Kitchen gentamicin cream (GARAMYCIN) 0.1 % Apply 1 application topically 3 (three) times daily. 30 g 1  . levothyroxine (SYNTHROID, LEVOTHROID) 150 MCG tablet TAKE 1 TABLET DAILY BUT TAKE ONE AND ONE-HALF TABLETS ON SUNDAY. TAKE 1 HOUR BEFORE FOOD, ONLY WITH WATER 115 tablet 1  . menthol-cetylpyridinium (CEPACOL) 3 MG lozenge Take 1 lozenge (  3 mg total) by mouth as needed for sore throat. 100 tablet 12  . Multiple Vitamin (MULITIVITAMIN WITH MINERALS) TABS Take 1 tablet by mouth daily.    . nitroGLYCERIN (NITROSTAT) 0.4 MG SL tablet Place 0.4 mg under the tongue every 5 (five) minutes as needed for chest pain.     . pravastatin (PRAVACHOL) 40 MG tablet TAKE 1 TABLET AT BEDTIME 90 tablet 4  . traMADol (ULTRAM) 50 MG tablet TAKE ONE-HALF TO ONE TABLET BY MOUTH THREE TIMES A DAY ONLY IF NEEDED FOR SEVERE PAIN 60 tablet 1  . vitamin C (ASCORBIC ACID) 500 MG tablet Take 1,000 mg by mouth daily.     . Magnesium 250 MG TABS Take 500 mg by mouth daily.      No current facility-administered medications on file prior to visit.     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  Screening Tests Immunization History  Administered Date(s) Administered  . Influenza Split 09/06/2013  . Influenza, High Dose Seasonal PF 07/20/2014, 08/20/2015, 07/24/2016  . Influenza,inj,Quad PF,6+ Mos 08/05/2017  . PPD Test 09/12/2016  . Pneumococcal Conjugate-13 10/30/2014  . Pneumococcal Polysaccharide-23 02/18/2012  . Td 02/18/2012  . Zoster 03/03/2013   Preventative care: Last colonoscopy: 2010 due 10/2014 but declines Cardiac Cath Date: 06/06/2008;  Stent Placement Date: 06/12/2008;  Holter/Event Monitor Date: 07/27/2008;  Nuclear Study Date: 04/05/2008;  Chest Xray Date: 06/02/2008 CT chest/abd/pel: getting q6 months for CA monitoring; most recent 07/31/2017  Prior vaccinations: TD or Tdap: 2013  Influenza: 2018 Pneumococcal: 2013 Prevnar 13: 2015 Shingles/Zostavax: 2014  Names of Other Physician/Practitioners you currently use: 1. [facility] here for primary care 2. Dr. Satira Sark, eye doctor, last visit 2017 3. , dentist, last visit  Remote; full dentures  Patient Care Team: Unk Pinto, MD as PCP - General (Internal Medicine) Jacolyn Reedy, MD as Consulting Physician (Cardiology) Marygrace Drought, MD as Consulting Physician (Ophthalmology) Wyatt Portela, MD as Consulting Physician (Oncology) Edrick Kins, DPM as Consulting Physician (Podiatry)  Surgical: He  has a past surgical history that includes Joint replacement; Cholecystectomy; Tonsillectomy; Appendectomy; Eye surgery; Laparoscopic nephrectomy  (04/29/2012); Coronary angioplasty; Back surgery; Anterior cervical decomp/discectomy fusion (N/A, 01/18/2013); I&D extremity (Right, 09/08/2016); Toe arthroplasty (Right, 09/08/2016); and Wound debridement (Right, 09/08/2016). Family His family history includes Cancer in his brother and sister. Social history  He reports that he quit smoking about 47 years ago. His smoking use included cigarettes. he has never used smokeless tobacco. He reports that he drinks about 6.6 oz of alcohol per week. He reports that he does not use drugs.  MEDICARE WELLNESS OBJECTIVES: Physical activity: Current Exercise Habits: The patient does not participate in regular exercise at present(Twice weekly PT for decompensation), Exercise limited by: orthopedic condition(s);cardiac condition(s) Cardiac risk factors: Cardiac Risk Factors include: advanced age (>1men, >13 women);dyslipidemia;hypertension;male gender;sedentary lifestyle;smoking/ tobacco exposure Depression/mood screen:   Depression screen [facility] 2/9 11/11/2017  Decreased Interest 0  Down, Depressed, Hopeless 0  PHQ - 2 Score 0  Altered sleeping -  Tired, decreased energy -  Change in  appetite -  Feeling bad or failure about yourself  -  Trouble concentrating -  Moving slowly or fidgety/restless -  Suicidal thoughts -  PHQ-9 Score -    ADLs:  In your present state of health, do you have any difficulty performing the following activities: 11/11/2017 07/11/2017  Hearing? Y Y  Comment Bilateral hearing aids; very advanced presbycusis bilat hearing aids  Vision? N N  Difficulty concentrating or making decisions? N Y  Comment - mild dementia - daughter has moved in to supervise his care  Walking or climbing stairs? Y Y  Comment Wheelchair bound; ramp/lift chair at home.  unstable gait  Dressing or bathing? Y N  Comment has new home health nurse -  Doing errands, shopping? N Y  Comment - requires transport.  Preparing Food and eating ? Y -  Comment Daughter  lives with and prepares -  Using the Toilet? N -  In the past six months, have you accidently leaked urine? N -  Do you have problems with loss of bowel control? N -  Managing your Medications? N -  Managing your Finances? Y -  Comment Daughter manages [employer]; he balances checkbook -  Housekeeping or managing your Housekeeping? Y -  Comment daughter manages -  Some recent data might be hidden     Cognitive Testing  Alert? Yes  Normal Appearance?Yes  Oriented to person? Yes  Place? Yes   Time? Yes  Recall of three objects?  Yes  Can perform simple calculations? Yes  Displays appropriate judgment?Yes  Can read the correct time from a watch face?Yes  EOL planning: Does Patient Have a Medical Advance Directive?: Yes Type of Advance Directive: Living will Does patient want to make changes to medical advance directive?: No - Patient declined   Objective:   Today's Vitals   11/11/17 1558  BP: 134/64  Pulse: 84  Temp: 97.7 F (36.5 C)  SpO2: 96%   There is no height or weight on file to calculate BMI.  General appearance: alert, no distress, wheelchair bound male, somewhat poor hygiene HEENT: normocephalic, sclerae anicteric, TMs pearly, nares patent, no discharge or erythema, pharynx normal. Very HOH with bilateral hearing aids Oral cavity: MMM, no lesions, full dentures Neck: supple, no lymphadenopathy, no thyromegaly, no masses Heart: RRR, normal S1, S2, no murmurs Lungs: CTA bilaterally, no wheezes, rhonchi, or rales Abdomen: +bs, soft, non tender, non distended, no masses, no hepatomegaly, no splenomegaly Musculoskeletal: nontender, no swelling, no obvious deformity Extremities: Bilateral lower extremity 1+ pitting edema, skin over shins thickened/flakey, bilateral ankles to mid lower legs injected; left LE with clear border of injection - hot to touch. No cyanosis, no clubbing Pulses: 2+ symmetric, upper extremities, lower extremity pulses obstructed by edema,  normal cap refill Neurological: alert, oriented x 3, CN2-12 intact, strength normal upper extremities and lower extremities, he does have ongoing numbness of 2nd and 3rd digits of bilateral hands (s/p cervical fusion).   Psychiatric: normal affect, behavior normal, pleasant   Medicare Attestation I have personally reviewed: The patient's medical and social history Their use of alcohol, tobacco or illicit drugs Their current medications and supplements The patient's functional ability including ADLs,fall risks, home safety risks, cognitive, and hearing and visual impairment Diet and physical activities Evidence for depression or mood disorders  The patient's weight, height, BMI, and visual acuity have been recorded in the chart.  I have made referrals, counseling, and provided education to the patient based on review of the above  and I have provided the patient with a written personalized care plan for preventive services.     Izora Ribas, NP   11/11/2017

## 2017-11-11 ENCOUNTER — Ambulatory Visit: Payer: Medicare Other | Admitting: Adult Health

## 2017-11-11 ENCOUNTER — Encounter: Payer: Self-pay | Admitting: Adult Health

## 2017-11-11 VITALS — BP 134/64 | HR 84 | Temp 97.7°F

## 2017-11-11 DIAGNOSIS — C7801 Secondary malignant neoplasm of right lung: Secondary | ICD-10-CM

## 2017-11-11 DIAGNOSIS — I10 Essential (primary) hypertension: Secondary | ICD-10-CM | POA: Diagnosis not present

## 2017-11-11 DIAGNOSIS — Z0001 Encounter for general adult medical examination with abnormal findings: Secondary | ICD-10-CM

## 2017-11-11 DIAGNOSIS — I700 Atherosclerosis of aorta: Secondary | ICD-10-CM

## 2017-11-11 DIAGNOSIS — G894 Chronic pain syndrome: Secondary | ICD-10-CM

## 2017-11-11 DIAGNOSIS — K219 Gastro-esophageal reflux disease without esophagitis: Secondary | ICD-10-CM

## 2017-11-11 DIAGNOSIS — E782 Mixed hyperlipidemia: Secondary | ICD-10-CM | POA: Diagnosis not present

## 2017-11-11 DIAGNOSIS — R6889 Other general symptoms and signs: Secondary | ICD-10-CM

## 2017-11-11 DIAGNOSIS — L02416 Cutaneous abscess of left lower limb: Secondary | ICD-10-CM

## 2017-11-11 DIAGNOSIS — C649 Malignant neoplasm of unspecified kidney, except renal pelvis: Secondary | ICD-10-CM | POA: Diagnosis not present

## 2017-11-11 DIAGNOSIS — I2510 Atherosclerotic heart disease of native coronary artery without angina pectoris: Secondary | ICD-10-CM

## 2017-11-11 DIAGNOSIS — M159 Polyosteoarthritis, unspecified: Secondary | ICD-10-CM | POA: Diagnosis not present

## 2017-11-11 DIAGNOSIS — N184 Chronic kidney disease, stage 4 (severe): Secondary | ICD-10-CM | POA: Diagnosis not present

## 2017-11-11 DIAGNOSIS — R7309 Other abnormal glucose: Secondary | ICD-10-CM | POA: Diagnosis not present

## 2017-11-11 DIAGNOSIS — E034 Atrophy of thyroid (acquired): Secondary | ICD-10-CM

## 2017-11-11 DIAGNOSIS — E559 Vitamin D deficiency, unspecified: Secondary | ICD-10-CM | POA: Diagnosis not present

## 2017-11-11 DIAGNOSIS — Z79899 Other long term (current) drug therapy: Secondary | ICD-10-CM

## 2017-11-11 DIAGNOSIS — L03116 Cellulitis of left lower limb: Secondary | ICD-10-CM

## 2017-11-11 DIAGNOSIS — D508 Other iron deficiency anemias: Secondary | ICD-10-CM

## 2017-11-11 DIAGNOSIS — M4800 Spinal stenosis, site unspecified: Secondary | ICD-10-CM

## 2017-11-11 DIAGNOSIS — Z0000 Encounter for general adult medical examination without abnormal findings: Secondary | ICD-10-CM

## 2017-11-11 DIAGNOSIS — M1000 Idiopathic gout, unspecified site: Secondary | ICD-10-CM

## 2017-11-11 MED ORDER — CEPHALEXIN 500 MG PO CAPS: 500.0000 mg | ORAL_CAPSULE | Freq: Three times a day (TID) | ORAL | 0 refills | Status: DC

## 2017-11-11 NOTE — Patient Instructions (Signed)
  [facility]ysis # [id number]    Cellulitis, Adult Cellulitis is a skin infection. The infected area is usually red and tender. This condition occurs most often in the arms and lower legs. The infection can travel to the muscles, blood, and underlying tissue and become serious. It is very important to get treated for this condition. What are the causes? Cellulitis is caused by bacteria. The bacteria enter through a break in the skin, such as a cut, burn, insect bite, open sore, or crack. What increases the risk? This condition is more likely to occur in people who:  Have a weak defense system (immune system).  Have open wounds on the skin such as cuts, burns, bites, and scrapes. Bacteria can enter the body through these open wounds.  Are older.  Have diabetes.  Have a type of long-lasting (chronic) liver disease (cirrhosis) or kidney disease.  Use IV drugs.  What are the signs or symptoms? Symptoms of this condition include:  Redness, streaking, or spotting on the skin.  Swollen area of the skin.  Tenderness or pain when an area of the skin is touched.  Warm skin.  Fever.  Chills.  Blisters.  How is this diagnosed? This condition is diagnosed based on a medical history and physical exam. You may also have tests, including:  Blood tests.  Lab tests.  Imaging tests.  How is this treated? Treatment for this condition may include:  Medicines, such as antibiotic medicines or antihistamines.  Supportive care, such as rest and application of cold or warm cloths (cold or warm compresses) to the skin.  Hospital care, if the condition is severe.  The infection usually gets better within 1-2 days of treatment. Follow these instructions at home:  Take over-the-counter and prescription medicines only as told by your health care provider.  If you were prescribed an antibiotic medicine, take it as told by your health care provider. Do not stop taking the antibiotic even if  you start to feel better.  Drink enough fluid to keep your urine clear or pale yellow.  Do not touch or rub the infected area.  Raise (elevate) the infected area above the level of your heart while you are sitting or lying down.  Apply warm or cold compresses to the affected area as told by your health care provider.  Keep all follow-up visits as told by your health care provider. This is important. These visits let your health care provider make sure a more serious infection is not developing. Contact a health care provider if:  You have a fever.  Your symptoms do not improve within 1-2 days of starting treatment.  Your bone or joint underneath the infected area becomes painful after the skin has healed.  Your infection returns in the same area or another area.  You notice a swollen bump in the infected area.  You develop new symptoms.  You have a general ill feeling (malaise) with muscle aches and pains. Get help right away if:  Your symptoms get worse.  You feel very sleepy.  You develop vomiting or diarrhea that persists.  You notice red streaks coming from the infected area.  Your red area gets larger or turns dark in color. This information is not intended to replace advice given to you by your health care provider. Make sure you discuss any questions you have with your health care provider. Document Released: 08/06/2005 Document Revised: 03/06/2016 Document Reviewed: 09/05/2015 Elsevier Interactive Patient Education  [employer].

## 2017-11-12 LAB — CBC WITH DIFFERENTIAL/PLATELET
BASOS ABS: 82 {cells}/uL (ref 0–200)
Basophils Relative: 1 %
EOS ABS: 459 {cells}/uL (ref 15–500)
Eosinophils Relative: 5.6 %
HCT: 36.7 % — ABNORMAL LOW (ref 38.5–50.0)
Hemoglobin: 12 g/dL — ABNORMAL LOW (ref 13.2–17.1)
Lymphs Abs: 1820 cells/uL (ref 850–3900)
MCH: 30.5 pg (ref 27.0–33.0)
MCHC: 32.7 g/dL (ref 32.0–36.0)
MCV: 93.4 fL (ref 80.0–100.0)
MONOS PCT: 9.4 %
MPV: 10.6 fL (ref 7.5–12.5)
NEUTROS PCT: 61.8 %
Neutro Abs: 5068 cells/uL (ref 1500–7800)
PLATELETS: 225 10*3/uL (ref 140–400)
RBC: 3.93 10*6/uL — ABNORMAL LOW (ref 4.20–5.80)
RDW: 14.1 % (ref 11.0–15.0)
TOTAL LYMPHOCYTE: 22.2 %
WBC: 8.2 10*3/uL (ref 3.8–10.8)
WBCMIX: 771 {cells}/uL (ref 200–950)

## 2017-11-12 LAB — BASIC METABOLIC PANEL WITH GFR
BUN/Creatinine Ratio: 17 (calc) (ref 6–22)
BUN: 37 mg/dL — ABNORMAL HIGH (ref 7–25)
CO2: 27 mmol/L (ref 20–32)
Calcium: 8.9 mg/dL (ref 8.6–10.3)
Chloride: 100 mmol/L (ref 98–110)
Creat: 2.19 mg/dL — ABNORMAL HIGH (ref 0.70–1.11)
GFR, Est African American: 30 mL/min/{1.73_m2} — ABNORMAL LOW (ref >=60)
GFR, Est Non African American: 26 mL/min/{1.73_m2} — ABNORMAL LOW (ref >=60)
GLUCOSE: 165 mg/dL — AB (ref 65–99)
POTASSIUM: 4.2 mmol/L (ref 3.5–5.3)
SODIUM: 136 mmol/L (ref 135–146)

## 2017-11-12 LAB — HEPATIC FUNCTION PANEL
AG Ratio: 1 (calc) (ref 1.0–2.5)
ALKALINE PHOSPHATASE (APISO): 98 U/L (ref 40–115)
ALT: 10 U/L (ref 9–46)
AST: 15 U/L (ref 10–35)
Albumin: 3.6 g/dL (ref 3.6–5.1)
BILIRUBIN INDIRECT: 0.3 mg/dL (ref 0.2–1.2)
BILIRUBIN TOTAL: 0.4 mg/dL (ref 0.2–1.2)
Bilirubin, Direct: 0.1 mg/dL (ref 0.0–0.2)
Globulin: 3.5 g/dL (calc) (ref 1.9–3.7)
TOTAL PROTEIN: 7.1 g/dL (ref 6.1–8.1)

## 2017-11-12 LAB — TSH: TSH: 1.12 m[IU]/L (ref 0.40–4.50)

## 2017-11-12 LAB — LIPID PANEL
Cholesterol: 157 mg/dL (ref <200)
HDL: 41 mg/dL (ref >40)
LDL Cholesterol (Calc): 89 mg/dL (calc)
Non-HDL Cholesterol (Calc): 116 mg/dL (calc) (ref <130)
TRIGLYCERIDES: 172 mg/dL — AB (ref <150)
Total CHOL/HDL Ratio: 3.8 (calc) (ref <5.0)

## 2017-11-12 LAB — HEMOGLOBIN A1C
EAG (MMOL/L): 7 (calc)
Hgb A1c MFr Bld: 6 % of total Hgb — ABNORMAL HIGH (ref <5.7)
MEAN PLASMA GLUCOSE: 126 (calc)

## 2017-11-18 ENCOUNTER — Encounter: Payer: Self-pay | Admitting: Internal Medicine

## 2017-12-07 ENCOUNTER — Encounter: Payer: Self-pay | Admitting: Podiatry

## 2017-12-07 ENCOUNTER — Ambulatory Visit (INDEPENDENT_AMBULATORY_CARE_PROVIDER_SITE_OTHER): Payer: Medicare Other | Admitting: Podiatry

## 2017-12-07 DIAGNOSIS — L309 Dermatitis, unspecified: Secondary | ICD-10-CM | POA: Diagnosis not present

## 2017-12-07 DIAGNOSIS — B351 Tinea unguium: Secondary | ICD-10-CM

## 2017-12-07 DIAGNOSIS — M79676 Pain in unspecified toe(s): Secondary | ICD-10-CM | POA: Diagnosis not present

## 2017-12-07 MED ORDER — CLOBETASOL PROPIONATE 0.05 % EX CREA: 1.0000 "application " | TOPICAL_CREAM | Freq: Two times a day (BID) | CUTANEOUS | 1 refills | Status: DC

## 2017-12-10 NOTE — Progress Notes (Signed)
   SUBJECTIVE Patient presents to office today complaining of elongated, thickened nails. Pain while ambulating in shoes. Patient is unable to trim their own nails. He also reports he has an unresolved rash to the left leg. He has been evaluated by his PCP and was prescribed an antibiotic he has taken as directed with no significant relief. Patient is here for further evaluation and treatment.   Past Medical History:  Diagnosis Date  . Anemia   . Arthritis    stenosis - back, neck  . Cancer [facility])    renal neoplasm  . Chronic kidney disease    renalCa- nephrectomy [facility]) 04/2012  . Coronary artery disease   . GERD (gastroesophageal reflux disease)   . Hard of hearing   . Hearing deficit    hearing aids- bilateral  . Hypertension    sees Dr. Wynonia Lawman, stress test recently for prep for surgery  . Hypothyroidism   . 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 Prediabetes   . Vitamin D deficiency     OBJECTIVE General Patient is awake, alert, and oriented x 3 and in no acute distress. Derm Skin is dry and supple bilateral. Negative open lesions or macerations. Nails are tender, long, thickened and dystrophic with subungual debris, consistent with onychomycosis, 1-5 bilateral. No signs of infection noted. Pigmentation noted with a well circumscribed ring noted to the left leg.  Vasc  DP and PT pedal pulses palpable bilaterally. Temperature gradient within normal limits.  Neuro Epicritic and protective threshold sensation diminished bilaterally.  Musculoskeletal Exam No symptomatic pedal deformities noted bilateral. Muscular strength within normal limits.  ASSESSMENT 1. Onychodystrophic nails 1-5 bilateral with hyperkeratosis of nails.  2. Onychomycosis of nail due to dermatophyte bilateral 3. Pain in foot bilateral 4. Superficial dermatitis left leg   PLAN OF CARE 1. Patient evaluated today.  2. Instructed to maintain good  pedal hygiene and foot care.  3. Mechanical debridement of nails 1-5 bilaterally performed using a nail nipper. Filed with dremel without incident.  4. Prescription for clobetasol topical 0.05% cream provided to patient.  5. Return to clinic in 3 months.   Edrick Kins, DPM [facility]  Dr. Edrick Kins, [address]                                        [place], [place] [address]                Office [phone]  Fax [phone]

## 2017-12-14 ENCOUNTER — Telehealth: Payer: Self-pay

## 2017-12-14 NOTE — Telephone Encounter (Signed)
Rescheduled patient appointment for 3/26 @10 :30 am, due to labs, and CT already scheduled for this day. Per 2/4 reschedule calls.

## 2018-01-16 ENCOUNTER — Other Ambulatory Visit: Payer: Self-pay | Admitting: Internal Medicine

## 2018-01-25 NOTE — Patient Instructions (Signed)

## 2018-01-25 NOTE — Progress Notes (Signed)
[name]   Unk Pinto, M.D.     [name]. [name], P.A.-C [name], [name]                [address] [place], [place] [address] Telephone [phone] Telefax [phone] Annual  Screening/Preventative Visit  & Comprehensive Evaluation & Examination     This very nice 84 y.o. WWM presents for a Screening/Preventative Visit & comprehensive evaluation and management of multiple medical co-morbidities.  Patient has been followed for HTN, ASCAD, Chronic Venous Insufficiency HLD, Prediabetes, Hypothyroidism, Hx Gout  and Vitamin D Deficiency. He also has Metastatic Renal Cell Carcinoma  s/p Nephrectomy (June 2013) metastatic to the Rt  Lung treated by focused Radiation and followed by Dr Alen Blew.        Patient is very sedentary, non-weight bearing and is in a wheelchair and uses a scooter at home.     HTN predates since 1997 and also has HT CKD4. Patient's BP has been controlled at home.  In 2009, he Had PCA/Stent (Dr. Wynonia Lawman).  Today's BP is at goal - 126/72. Patient denies any cardiac symptoms as chest pain, palpitations, shortness of breath, dizziness or ankle swelling.     Patient's hyperlipidemia is controlled with diet and medications. Patient denies myalgias or other medication SE's. Last lipids were at goal albeit elevated Trig's: Lab Results  Component Value Date   CHOL 169 01/26/2018   HDL 45 01/26/2018   LDLCALC 102 (H) 01/26/2018   TRIG 119 01/26/2018   CHOLHDL 3.8 01/26/2018      Patient has Morbid Obesity (BMI 39+) and consequent prediabetes (A1c 6.0%/2006 & 2017)  and patient denies reactive hypoglycemic symptoms, visual blurring, diabetic polys or paresthesias. Last A1c was not at goal: Lab Results  Component Value Date   HGBA1C 6.0 (H) 11/11/2017       Finally, patient has history of Vitamin D Deficiency ("24"/2008)  and last vitamin D was at goal; Lab Results   Component Value Date   VD25OH 61 07/09/2017   Current Outpatient Medications on File Prior to Visit  Medication Sig  . allopurinol (ZYLOPRIM) 300 MG tablet TAKE 1 TABLET DAILY  . amLODipine (NORVASC) 5 MG tablet TAKE 1 TABLET DAILY BEFORE BREAKFAST  . aspirin EC 81 MG tablet Take 81 mg by mouth daily.  . bumetanide (BUMEX) 2 MG tablet TAKE ONE-HALF (1/2) TO ONE TABLET DAILY FOR FLUID  . Cholecalciferol (VITAMIN D) 2000 units tablet Take by mouth.  . clobetasol cream (TEMOVATE) 7.06 % Apply 1 application topically 2 (two) times daily.  Marland Kitchen FERROUS SULFATE PO Take 1 tablet by mouth 3 (three) times daily.   Marland Kitchen gentamicin cream (GARAMYCIN) 0.1 % Apply 1 application topically 3 (three) times daily.  Marland Kitchen levothyroxine (SYNTHROID, LEVOTHROID) 150 MCG tablet TAKE 1 TABLET DAILY BUT TAKE ONE AND ONE-HALF TABLETS ON SUNDAY. TAKE 1 HOUR BEFORE FOOD, ONLY WITH WATER  . Magnesium 250 MG TABS Take 500 mg by mouth daily.   Marland Kitchen menthol-cetylpyridinium (CEPACOL) 3 MG lozenge Take 1 lozenge (3 mg total) by mouth as needed for sore throat.  . Multiple Vitamin (MULITIVITAMIN WITH MINERALS) TABS Take 1 tablet by mouth daily.  . nitroGLYCERIN (NITROSTAT) 0.4 MG SL tablet Place 0.4 mg under the tongue every 5 (five) minutes as needed for chest pain.   . pravastatin (PRAVACHOL) 40 MG tablet TAKE 1 TABLET  AT BEDTIME  . vitamin C (ASCORBIC ACID) 500 MG tablet Take 1,000 mg by mouth daily.    No current facility-administered medications on file prior to visit.    No Known Allergies   Past Medical History:  Diagnosis Date  . Anemia   . Arthritis    stenosis - back, neck  . Cancer [facility])    renal neoplasm  . Chronic kidney disease    renalCa- nephrectomy [facility]) 04/2012  . Coronary artery disease   . GERD (gastroesophageal reflux disease)   . Hard of hearing   . Hearing deficit    hearing aids- bilateral  . Hypertension    sees Dr. Wynonia Lawman, stress test recently for prep for surgery  . Hypothyroidism   . Lung  cancer ([place])    right lower lobe nodule suspicious for metastatic clear cell renal cell neoplasm  . Prediabetes   . Vitamin D deficiency    Health Maintenance  Topic Date Due  . COLONOSCOPY  01/27/2019 (Originally 10/16/2009)  . TETANUS/TDAP  02/17/2022  . INFLUENZA VACCINE  Completed  . PNA vac Low Risk Adult  Completed   Immunization History  Administered Date(s) Administered  . Influenza Split 09/06/2013  . Influenza, High Dose Seasonal PF 07/20/2014, 08/20/2015, 07/24/2016  . Influenza,inj,Quad PF,6+ Mos 08/05/2017  . PPD Test 09/12/2016  . Pneumococcal Conjugate-13 10/30/2014  . Pneumococcal Polysaccharide-23 02/18/2012  . Td 02/18/2012  . Zoster 03/03/2013   Last Colon -  Past Surgical History:  Procedure Laterality Date  . ANTERIOR CERVICAL DECOMP/DISCECTOMY FUSION N/A 01/18/2013   Procedure: ANTERIOR CERVICAL DECOMPRESSION/DISCECTOMY FUSION 2 LEVELS;  Surgeon: Faythe Ghee, MD;  Location: [facility];  Service: Neurosurgery;  Laterality: N/A;  . APPENDECTOMY    . BACK SURGERY     lower back surgery - 1990's  . CHOLECYSTECTOMY    . CORONARY ANGIOPLASTY     stents , + 7-12yrs. ago  . EYE SURGERY     right eye cataract surgery   . I&D EXTREMITY Right 09/08/2016   Procedure: IRRIGATION AND DEBRIDEMENT RIGHT FOOT WITH ANTIBIOTIC BEADS;  Surgeon: Edrick Kins, DPM;  Location: [facility];  Service: Podiatry;  Laterality: Right;  . JOINT REPLACEMENT     bilateral knee replacements   . LAPAROSCOPIC NEPHRECTOMY  04/29/2012   Procedure: LAPAROSCOPIC NEPHRECTOMY;  Surgeon: Dutch Gray, MD;  Location: [facility];  Service: Urology;  Laterality: Left;      . TOE ARTHROPLASTY Right 09/08/2016   Procedure: Arthroplasty of First MPJ RIGHT FOOT;  Surgeon: Edrick Kins, DPM;  Location: [place];  Service: Podiatry;  Laterality: Right;  . TONSILLECTOMY    . WOUND DEBRIDEMENT Right 09/08/2016   Procedure: DEBRIDEMENT RIGHT ANKLE WOUND;  Surgeon: Edrick Kins, DPM;  Location: [place];  Service:  Podiatry;  Laterality: Right;   Family History  Problem Relation Age of Onset  . Cancer Sister        breast  . Cancer Brother        colon   Socioeconomic History  . Marital status: Widowed  . Number of children: I daughter - Vikki Ports who has moved in with him as a caretaker  Occupational History  . Retired 1999 from [employer]  (40 years)   Tobacco Use  . Smoking status: Former Smoker    Types: Cigarettes    Last attempt to quit: 11/10/1970    Years since quitting: 47.2  . Smokeless tobacco: Never Used  Substance and Sexual Activity  . Alcohol  use: Yes    Alcohol/week: 6.6 oz    Types: 2 Cans of beer, 9 Shots of liquor per week    Comment: friday- beer & 3 drambuie  . Drug use: No  . Sexual activity: No    ROS Constitutional: Denies fever, chills, weight loss/gain, headaches, insomnia,  night sweats or change in appetite. Does c/o fatigue. Eyes: Denies redness, blurred vision, diplopia, discharge, itchy or watery eyes.  ENT: Denies discharge, congestion, post nasal drip, epistaxis, sore throat, earache, hearing loss, dental pain, Tinnitus, Vertigo, Sinus pain or snoring.  Cardio: Denies chest pain, palpitations, irregular heartbeat, syncope, dyspnea, diaphoresis, orthopnea, PND, claudication or edema Respiratory: denies cough, dyspnea, DOE, pleurisy, hoarseness, laryngitis or wheezing.  Gastrointestinal: Denies dysphagia, heartburn, reflux, water brash, pain, cramps, nausea, vomiting, bloating, diarrhea, constipation, hematemesis, melena, hematochezia, jaundice or hemorrhoids Genitourinary: Denies dysuria, frequency, urgency, nocturia, hesitancy, discharge, hematuria or flank pain Musculoskeletal: Denies arthralgia, myalgia, stiffness, Jt. Swelling, pain, limp or strain/sprain. Denies Falls. Skin: Denies puritis, rash, hives, warts, acne, eczema or change in skin lesion Neuro: No weakness, tremor, incoordination, spasms, paresthesia or pain Psychiatric: Denies confusion,  memory loss or sensory loss. Denies Depression. Endocrine: Denies change in weight, skin, hair change, nocturia, and paresthesia, diabetic polys, visual blurring or hyper / hypo glycemic episodes.  Heme/Lymph: No excessive bleeding, bruising or enlarged lymph nodes.  Physical Exam  BP 126/72   Pulse 68   Temp (!) 97.3 F (36.3 C)   Resp 18   Ht 5\' 8"  (1.727 m)   Wt 260 lb (117.9 kg) Comment: unable to stand  BMI 39.53 kg/m   General Appearance: Over nourished, sedentary and in no apparent distress.  Eyes: PERRLA, EOMs, conjunctiva no swelling or erythema, normal fundi and vessels. Sinuses: No frontal/maxillary tenderness ENT/Mouth: EACs patent / TMs  nl. Nares clear without erythema, swelling, mucoid exudates. Oral hygiene is good. No erythema, swelling, or exudate. Tongue normal, non-obstructing. Tonsils not swollen or erythematous. Hearing normal.  Neck: Supple, thyroid not palpable. No bruits, nodes or JVD. Respiratory: Respiratory effort normal.  BS equal and clear bilateral without rales, rhonci, wheezing or stridor. Cardio: Heart sounds are normal with regular rate and rhythm and no murmurs, rubs or gallops. Peripheral pulses are normal and equal bilaterally without edema. No aortic or femoral bruits. Chest: symmetric with normal excursions and percussion.  Abdomen: Soft, rotund with Nl bowel sounds. Nontender, no guarding, rebound, hernias, masses, or organomegaly.  Lymphatics: Non tender without lymphadenopathy.  Musculoskeletal: Severe generalized decrease in muscle power , tone & bulk. In a wheel chair. Unable to stand/waly independently. Skin: Warm and dry without rashes, lesions, cyanosis, clubbing or  ecchymosis.  Neuro: Cranial nerves intact, reflexes equal bilaterally. Normal muscle tone, no cerebellar symptoms. Sensation intact.  Pysch: Alert and oriented X 3 with normal affect, insight and judgment appropriate.   Assessment and Plan  1. Annual Preventative/Screening  Exam   2. Essential hypertension  - EKG 12-Lead - [place], RETROPERITNL ABD,  LTD - Urinalysis, Routine w reflex microscopic - Microalbumin / creatinine urine ratio - CBC with Differential/Platelet - BASIC METABOLIC PANEL WITH GFR - Magnesium - TSH  3. Hyperlipidemia, mixed  - EKG 12-Lead - [place], RETROPERITNL ABD,  LTD - Hepatic function panel - Lipid panel - TSH  4. Abnormal glucose  - EKG 12-Lead - [place], RETROPERITNL ABD,  LTD - Hemoglobin A1c - Insulin, random  5. Vitamin D deficiency  - VITAMIN D 25 Hydroxyl  6. Prediabetes  - EKG 12-Lead -  [place], RETROPERITNL ABD,  LTD - Hemoglobin A1c - Insulin, random  7. Coronary artery disease involving native coronary artery of native heart without angina pectoris  - EKG 12-Lead - Lipid panel  8. Hypothyroidism  - TSH  9. Gastroesophageal reflux disease  - CBC with Differential/Platelet  10. Screening for ischemic heart disease  - EKG 12-Lead  11. Benign localized prostatic hyperplasia with lower urinary tract symptoms (LUTS)  - PSA  12. Former smoker  - EKG 12-Lead - [place], RETROPERITNL ABD,  LTD  13. Screening for AAA (aortic abdominal aneurysm)  - [place], RETROPERITNL ABD,  LTD  14. Aortic atherosclerosis (HCC)  - EKG 12-Lead - [place], RETROPERITNL ABD,  LTD  15. Malignant neoplasm of left kidney ([place])   16. Solitary Right Lower Lung Metastasis from Renal Cell Carcinoma   17. Obesity (BMI 35.0-39.9 without comorbidity)  - Hemoglobin A1c  18. Prostate cancer screening  - PSA  19. Screening for colorectal cancer  - POC Hemoccult Bld/Stl   20. CKD, stage IV (GFR 28 ml/min) (HCC)  - BASIC METABOLIC PANEL WITH GFR  21. Idiopathic gout  - Uric acid  22. Unstable gait  23. Physical deconditioning  24. Medication management  - Uric acid - CBC with Differential/Platelet - BASIC METABOLIC PANEL WITH GFR - Hepatic function panel - Magnesium - Lipid panel - TSH - Hemoglobin A1c - Insulin,  random - VITAMIN D 25 Hydroxyl         Patient was counseled in prudent diet, weight control to achieve/maintain BMI less than 25, BP monitoring, regular exercise and medications as discussed.  Discussed med effects and SE's. Routine screening labs and tests as requested with regular follow-up as recommended. Over 40 minutes of exam, counseling, chart review and high complex critical decision making was performed

## 2018-01-26 ENCOUNTER — Encounter: Payer: Self-pay | Admitting: Internal Medicine

## 2018-01-26 ENCOUNTER — Ambulatory Visit: Payer: Medicare Other | Admitting: Internal Medicine

## 2018-01-26 VITALS — BP 126/72 | HR 68 | Temp 97.3°F | Resp 18 | Ht 68.0 in | Wt 260.0 lb

## 2018-01-26 DIAGNOSIS — N401 Enlarged prostate with lower urinary tract symptoms: Secondary | ICD-10-CM

## 2018-01-26 DIAGNOSIS — Z79899 Other long term (current) drug therapy: Secondary | ICD-10-CM

## 2018-01-26 DIAGNOSIS — Z136 Encounter for screening for cardiovascular disorders: Secondary | ICD-10-CM

## 2018-01-26 DIAGNOSIS — R5381 Other malaise: Secondary | ICD-10-CM

## 2018-01-26 DIAGNOSIS — R7303 Prediabetes: Secondary | ICD-10-CM

## 2018-01-26 DIAGNOSIS — Z0001 Encounter for general adult medical examination with abnormal findings: Secondary | ICD-10-CM

## 2018-01-26 DIAGNOSIS — Z125 Encounter for screening for malignant neoplasm of prostate: Secondary | ICD-10-CM

## 2018-01-26 DIAGNOSIS — R7309 Other abnormal glucose: Secondary | ICD-10-CM

## 2018-01-26 DIAGNOSIS — R2681 Unsteadiness on feet: Secondary | ICD-10-CM

## 2018-01-26 DIAGNOSIS — I700 Atherosclerosis of aorta: Secondary | ICD-10-CM

## 2018-01-26 DIAGNOSIS — Z0000 Encounter for general adult medical examination without abnormal findings: Secondary | ICD-10-CM | POA: Diagnosis not present

## 2018-01-26 DIAGNOSIS — C7801 Secondary malignant neoplasm of right lung: Secondary | ICD-10-CM

## 2018-01-26 DIAGNOSIS — Z1211 Encounter for screening for malignant neoplasm of colon: Secondary | ICD-10-CM

## 2018-01-26 DIAGNOSIS — C642 Malignant neoplasm of left kidney, except renal pelvis: Secondary | ICD-10-CM

## 2018-01-26 DIAGNOSIS — I2510 Atherosclerotic heart disease of native coronary artery without angina pectoris: Secondary | ICD-10-CM

## 2018-01-26 DIAGNOSIS — E782 Mixed hyperlipidemia: Secondary | ICD-10-CM

## 2018-01-26 DIAGNOSIS — K219 Gastro-esophageal reflux disease without esophagitis: Secondary | ICD-10-CM

## 2018-01-26 DIAGNOSIS — Z87891 Personal history of nicotine dependence: Secondary | ICD-10-CM

## 2018-01-26 DIAGNOSIS — M1000 Idiopathic gout, unspecified site: Secondary | ICD-10-CM

## 2018-01-26 DIAGNOSIS — N184 Chronic kidney disease, stage 4 (severe): Secondary | ICD-10-CM

## 2018-01-26 DIAGNOSIS — E559 Vitamin D deficiency, unspecified: Secondary | ICD-10-CM

## 2018-01-26 DIAGNOSIS — Z1212 Encounter for screening for malignant neoplasm of rectum: Secondary | ICD-10-CM

## 2018-01-26 DIAGNOSIS — I10 Essential (primary) hypertension: Secondary | ICD-10-CM | POA: Diagnosis not present

## 2018-01-26 DIAGNOSIS — E669 Obesity, unspecified: Secondary | ICD-10-CM

## 2018-01-26 DIAGNOSIS — E039 Hypothyroidism, unspecified: Secondary | ICD-10-CM

## 2018-01-27 LAB — HEPATIC FUNCTION PANEL
AG RATIO: 1 (calc) (ref 1.0–2.5)
ALKALINE PHOSPHATASE (APISO): 90 U/L (ref 40–115)
ALT: 12 U/L (ref 9–46)
AST: 14 U/L (ref 10–35)
Albumin: 3.6 g/dL (ref 3.6–5.1)
BILIRUBIN INDIRECT: 0.4 mg/dL (ref 0.2–1.2)
Bilirubin, Direct: 0.1 mg/dL (ref 0.0–0.2)
GLOBULIN: 3.6 g/dL (ref 1.9–3.7)
TOTAL PROTEIN: 7.2 g/dL (ref 6.1–8.1)
Total Bilirubin: 0.5 mg/dL (ref 0.2–1.2)

## 2018-01-27 LAB — CBC WITH DIFFERENTIAL/PLATELET
BASOS ABS: 100 {cells}/uL (ref 0–200)
Basophils Relative: 1.2 %
EOS ABS: 647 {cells}/uL — AB (ref 15–500)
Eosinophils Relative: 7.8 %
HEMATOCRIT: 36.6 % — AB (ref 38.5–50.0)
HEMOGLOBIN: 12.1 g/dL — AB (ref 13.2–17.1)
LYMPHS ABS: 2050 {cells}/uL (ref 850–3900)
MCH: 31.3 pg (ref 27.0–33.0)
MCHC: 33.1 g/dL (ref 32.0–36.0)
MCV: 94.6 fL (ref 80.0–100.0)
MPV: 10.4 fL (ref 7.5–12.5)
Monocytes Relative: 8.7 %
NEUTROS ABS: 4781 {cells}/uL (ref 1500–7800)
Neutrophils Relative %: 57.6 %
Platelets: 233 10*3/uL (ref 140–400)
RBC: 3.87 10*6/uL — ABNORMAL LOW (ref 4.20–5.80)
RDW: 14.4 % (ref 11.0–15.0)
Total Lymphocyte: 24.7 %
WBC: 8.3 10*3/uL (ref 3.8–10.8)
WBCMIX: 722 {cells}/uL (ref 200–950)

## 2018-01-27 LAB — BASIC METABOLIC PANEL WITH GFR
BUN / CREAT RATIO: 18 (calc) (ref 6–22)
BUN: 41 mg/dL — AB (ref 7–25)
CO2: 26 mmol/L (ref 20–32)
CREATININE: 2.22 mg/dL — AB (ref 0.70–1.11)
Calcium: 9 mg/dL (ref 8.6–10.3)
Chloride: 103 mmol/L (ref 98–110)
GFR, Est African American: 30 mL/min/{1.73_m2} — ABNORMAL LOW (ref >=60)
GFR, Est Non African American: 26 mL/min/{1.73_m2} — ABNORMAL LOW (ref >=60)
Glucose, Bld: 152 mg/dL — ABNORMAL HIGH (ref 65–99)
Potassium: 4 mmol/L (ref 3.5–5.3)
SODIUM: 137 mmol/L (ref 135–146)

## 2018-01-27 LAB — HEMOGLOBIN A1C
Hgb A1c MFr Bld: 6 % of total Hgb — ABNORMAL HIGH (ref <5.7)
Mean Plasma Glucose: 126 (calc)
eAG (mmol/L): 7 (calc)

## 2018-01-27 LAB — LIPID PANEL
CHOL/HDL RATIO: 3.8 (calc) (ref <5.0)
CHOLESTEROL: 169 mg/dL (ref <200)
HDL: 45 mg/dL (ref >40)
LDL CHOLESTEROL (CALC): 102 mg/dL — AB
Non-HDL Cholesterol (Calc): 124 mg/dL (calc) (ref <130)
Triglycerides: 119 mg/dL (ref <150)

## 2018-01-27 LAB — INSULIN, RANDOM: Insulin: 23.8 u[IU]/mL — ABNORMAL HIGH (ref 2.0–19.6)

## 2018-01-27 LAB — VITAMIN D 25 HYDROXY (VIT D DEFICIENCY, FRACTURES): VIT D 25 HYDROXY: 63 ng/mL (ref 30–100)

## 2018-01-27 LAB — MAGNESIUM: Magnesium: 2.2 mg/dL (ref 1.5–2.5)

## 2018-01-27 LAB — URIC ACID: URIC ACID, SERUM: 6 mg/dL (ref 4.0–8.0)

## 2018-01-27 LAB — TSH: TSH: 0.72 mIU/L (ref 0.40–4.50)

## 2018-01-27 LAB — PSA: PSA: 4.9 ng/mL — ABNORMAL HIGH (ref <=4.0)

## 2018-01-28 LAB — URINALYSIS, ROUTINE W REFLEX MICROSCOPIC
Bacteria, UA: NONE SEEN /HPF
Bilirubin Urine: NEGATIVE
Glucose, UA: NEGATIVE
Hgb urine dipstick: NEGATIVE
Hyaline Cast: NONE SEEN /LPF
Ketones, ur: NEGATIVE
Leukocytes, UA: NEGATIVE
Nitrite: NEGATIVE
RBC / HPF: NONE SEEN /HPF (ref 0–2)
Specific Gravity, Urine: 1.016 (ref 1.001–1.03)
pH: <=5 (ref 5.0–8.0)

## 2018-01-28 LAB — MICROALBUMIN / CREATININE URINE RATIO
Creatinine, Urine: 114 mg/dL (ref 20–320)
Microalb Creat Ratio: 414 ug/mg{creat} — ABNORMAL HIGH (ref <30)
Microalb, Ur: 47.2 mg/dL

## 2018-01-29 ENCOUNTER — Inpatient Hospital Stay: Payer: Medicare Other | Attending: Oncology

## 2018-01-29 ENCOUNTER — Ambulatory Visit: Payer: Medicare Other | Admitting: Oncology

## 2018-01-29 ENCOUNTER — Ambulatory Visit (HOSPITAL_COMMUNITY)
Admission: RE | Admit: 2018-01-29 | Discharge: 2018-01-29 | Disposition: A | Discharge status: Home / Self Care | Payer: Medicare Other | Source: Ambulatory Visit | Attending: Oncology | Admitting: Oncology

## 2018-01-29 DIAGNOSIS — M479 Spondylosis, unspecified: Secondary | ICD-10-CM | POA: Diagnosis not present

## 2018-01-29 DIAGNOSIS — I700 Atherosclerosis of aorta: Secondary | ICD-10-CM | POA: Diagnosis not present

## 2018-01-29 DIAGNOSIS — M12852 Other specific arthropathies, not elsewhere classified, left hip: Secondary | ICD-10-CM | POA: Insufficient documentation

## 2018-01-29 DIAGNOSIS — C649 Malignant neoplasm of unspecified kidney, except renal pelvis: Secondary | ICD-10-CM

## 2018-01-29 DIAGNOSIS — Z23 Encounter for immunization: Secondary | ICD-10-CM

## 2018-01-29 DIAGNOSIS — Z905 Acquired absence of kidney: Secondary | ICD-10-CM | POA: Diagnosis not present

## 2018-01-29 DIAGNOSIS — Z923 Personal history of irradiation: Secondary | ICD-10-CM | POA: Insufficient documentation

## 2018-01-29 DIAGNOSIS — Z85118 Personal history of other malignant neoplasm of bronchus and lung: Secondary | ICD-10-CM | POA: Diagnosis not present

## 2018-01-29 DIAGNOSIS — M12851 Other specific arthropathies, not elsewhere classified, right hip: Secondary | ICD-10-CM | POA: Diagnosis not present

## 2018-01-29 DIAGNOSIS — R918 Other nonspecific abnormal finding of lung field: Secondary | ICD-10-CM | POA: Diagnosis not present

## 2018-01-29 DIAGNOSIS — Z85528 Personal history of other malignant neoplasm of kidney: Secondary | ICD-10-CM | POA: Diagnosis not present

## 2018-01-29 LAB — COMPREHENSIVE METABOLIC PANEL
ALK PHOS: 90 U/L (ref 40–150)
ALT: 12 U/L (ref 0–55)
ANION GAP: 10 (ref 3–11)
AST: 14 U/L (ref 5–34)
Albumin: 3.2 g/dL — ABNORMAL LOW (ref 3.5–5.0)
BILIRUBIN TOTAL: 0.3 mg/dL (ref 0.2–1.2)
BUN: 54 mg/dL — ABNORMAL HIGH (ref 7–26)
CALCIUM: 9.5 mg/dL (ref 8.4–10.4)
CO2: 22 mmol/L (ref 22–29)
Chloride: 105 mmol/L (ref 98–109)
Creatinine, Ser: 2.27 mg/dL — ABNORMAL HIGH (ref 0.70–1.30)
GFR calc non Af Amer: 24 mL/min — ABNORMAL LOW (ref >60)
GFR, EST AFRICAN AMERICAN: 28 mL/min — AB (ref >60)
Glucose, Bld: 120 mg/dL (ref 70–140)
Potassium: 4.1 mmol/L (ref 3.5–5.1)
SODIUM: 137 mmol/L (ref 136–145)
TOTAL PROTEIN: 7.4 g/dL (ref 6.4–8.3)

## 2018-01-29 LAB — CBC WITH DIFFERENTIAL/PLATELET
Basophils Absolute: 0.1 10*3/uL (ref 0.0–0.1)
Basophils Relative: 1 %
EOS ABS: 0.5 10*3/uL (ref 0.0–0.5)
Eosinophils Relative: 7 %
HEMATOCRIT: 35.4 % — AB (ref 38.4–49.9)
Hemoglobin: 11.7 g/dL — ABNORMAL LOW (ref 13.0–17.1)
LYMPHS ABS: 2.1 10*3/uL (ref 0.9–3.3)
Lymphocytes Relative: 27 %
MCH: 32 pg (ref 27.2–33.4)
MCHC: 33.1 g/dL (ref 32.0–36.0)
MCV: 96.7 fL (ref 79.3–98.0)
MONOS PCT: 10 %
Monocytes Absolute: 0.8 10*3/uL (ref 0.1–0.9)
NEUTROS PCT: 55 %
Neutro Abs: 4.3 10*3/uL (ref 1.5–6.5)
Platelets: 213 10*3/uL (ref 140–400)
RBC: 3.66 MIL/uL — ABNORMAL LOW (ref 4.20–5.82)
RDW: 16 % — ABNORMAL HIGH (ref 11.0–14.6)
WBC: 7.8 10*3/uL (ref 4.0–10.3)

## 2018-01-30 ENCOUNTER — Other Ambulatory Visit: Payer: Self-pay | Admitting: Internal Medicine

## 2018-02-02 ENCOUNTER — Telehealth: Payer: Self-pay | Admitting: Oncology

## 2018-02-02 ENCOUNTER — Inpatient Hospital Stay (HOSPITAL_BASED_OUTPATIENT_CLINIC_OR_DEPARTMENT_OTHER): Payer: Medicare Other | Admitting: Oncology

## 2018-02-02 VITALS — BP 164/70 | HR 79 | Temp 98.4°F | Resp 24 | Ht 68.0 in | Wt 249.8 lb

## 2018-02-02 DIAGNOSIS — Z85118 Personal history of other malignant neoplasm of bronchus and lung: Secondary | ICD-10-CM

## 2018-02-02 DIAGNOSIS — Z923 Personal history of irradiation: Secondary | ICD-10-CM | POA: Diagnosis not present

## 2018-02-02 DIAGNOSIS — Z85528 Personal history of other malignant neoplasm of kidney: Secondary | ICD-10-CM

## 2018-02-02 DIAGNOSIS — C649 Malignant neoplasm of unspecified kidney, except renal pelvis: Secondary | ICD-10-CM

## 2018-02-02 DIAGNOSIS — Z905 Acquired absence of kidney: Secondary | ICD-10-CM | POA: Diagnosis not present

## 2018-02-02 NOTE — Progress Notes (Signed)
Hematology and Oncology Follow Up Visit  [name] [id number] 1929/08/30 84 y.o. 02/02/2018 10:43 AM Unk Pinto, MDMcKeown, [name], MD   Principle Diagnosis: 84 year old man stage IV renal cell carcinoma without any evidence of disease at this time.  He was initially diagnosed with T2 N0 renal cell carcinoma in June 2013.  He developed pulmonary metastasis in 2016.  Prior Therapy:   He underwent a left laparoscopic radical nephrectomy on 04/29/2012 by Dr. Alinda Money. 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  He is status post biopsy of right lower lung nodule obtained on 07/30/2015 which confirmed the presence of renal cell carcinoma.  He is status post radiation therapy to an isolated lung metastasis in the right lower lobe. He received total 54 gray in 3 fractions on 10/08/2015, 09/11/2015 and 09/14/2015.  He remained disease-free since that time.  Current therapy: Active surveillance.   Interim History: [name] is here for follow-up with his daughter.  Since the last visit, he reports no major changes in his health.  Continues to be limited in his mobility predominantly using a motorized scooter but able to transfer short distances.  He denied any respiratory symptoms including cough, wheezing or hemoptysis.  He denies any shortness of breath.  His appetite remain excellent and performance status is unchanged.   He does not report any headaches, blurry vision, syncope or seizures. He does not report any fevers, chills, sweats. He does not report any chest pain, palpitation or orthopnea.  He does not report any nausea, vomiting, abdominal pain, early satiety, change in his bowel habits or rectal bleeding. He does not report any frequency, urgency, hesitancy or skeletal complaints. He does not report any lymphadenopathy or petechiae.  He does not report any skin rashes or lesions.  He does not report any joint pain or  discomfort.. and the remaining review of system is is negative.  Medications: I have reviewed the patient's current medications.  Current Outpatient Medications  Medication Sig Dispense Refill  . allopurinol (ZYLOPRIM) 300 MG tablet TAKE 1 TABLET DAILY 90 tablet 1  . amLODipine (NORVASC) 5 MG tablet TAKE 1 TABLET DAILY BEFORE BREAKFAST 90 tablet 3  . aspirin EC 81 MG tablet Take 81 mg by mouth daily.    . bumetanide (BUMEX) 2 MG tablet TAKE ONE-HALF (1/2) TO ONE TABLET DAILY FOR FLUID 90 tablet 2  . Cholecalciferol (VITAMIN D) 2000 units tablet Take by mouth.    . clobetasol cream (TEMOVATE) 4.03 % Apply 1 application topically 2 (two) times daily. 30 g 1  . FERROUS SULFATE PO Take 1 tablet by mouth 3 (three) times daily.     Marland Kitchen levothyroxine (SYNTHROID, LEVOTHROID) 150 MCG tablet TAKE 1 TABLET DAILY BUT ONE AND ONE-HALF TABLETS ON SUNDAY. TAKE 1 HOUR BEFORE FOOD, ONLY WITH WATER 115 tablet 3  . Magnesium 250 MG TABS Take 500 mg by mouth daily.     Marland Kitchen menthol-cetylpyridinium (CEPACOL) 3 MG lozenge Take 1 lozenge (3 mg total) by mouth as needed for sore throat. 100 tablet 12  . Multiple Vitamin (MULITIVITAMIN WITH MINERALS) TABS Take 1 tablet by mouth daily.    . nitroGLYCERIN (NITROSTAT) 0.4 MG SL tablet Place 0.4 mg under the tongue every 5 (five) minutes as needed for chest pain.     . pravastatin (PRAVACHOL) 40 MG tablet TAKE 1 TABLET AT BEDTIME 90 tablet 4  . vitamin C (ASCORBIC ACID) 500 MG tablet  Take 1,000 mg by mouth daily.      No current facility-administered medications for this visit.      Allergies: No Known Allergies  Past Medical History, Surgical history, Social history, and Family History were reviewed and updated.   Physical Exam: Blood pressure (!) 164/70, pulse 79, temperature 98.4 F (36.9 C), temperature source Oral, resp. rate (!) 24, height 5\' 8"  (1.727 m), weight 249 lb 12.8 oz (113.3 kg), SpO2 97 %. ECOG: 2 General appearance: Alert, awake gentleman appeared  comfortable sitting in a wheelchair. Head: Atraumatic without abnormalities. Oropharynx: No oral thrush or ulcers. Eyes: No scleral icterus. Lymph nodes: No lymphadenopathy palpated in the cervical or supraclavicular regions. Heart: Regular rate and rhythm without any murmurs or gallops. Lung: Clear to auscultation without any rhonchi, wheezes or dullness to percussion. Abdomin: Soft, nontender without any rebound or guarding. Musculoskeletal: No joint deformity or effusion. Skin: No rashes or lesions.  Lab Results: Lab Results  Component Value Date   WBC 7.8 01/29/2018   HGB 11.7 (L) 01/29/2018   HCT 35.4 (L) 01/29/2018   MCV 96.7 01/29/2018   PLT 213 01/29/2018     Chemistry      Component Value Date/Time   NA 137 01/29/2018 1107   NA 137 07/31/2017 1058   K 4.1 01/29/2018 1107   K 4.0 07/31/2017 1058   CL 105 01/29/2018 1107   CO2 22 01/29/2018 1107   CO2 24 07/31/2017 1058   BUN 54 (H) 01/29/2018 1107   BUN 42.8 (H) 07/31/2017 1058   CREATININE 2.27 (H) 01/29/2018 1107   CREATININE 2.22 (H) 01/26/2018 1216   CREATININE 2.2 (H) 07/31/2017 1058   GLU 111 09/12/2016      Component Value Date/Time   CALCIUM 9.5 01/29/2018 1107   CALCIUM 9.1 07/31/2017 1058   ALKPHOS 90 01/29/2018 1107   ALKPHOS 98 07/31/2017 1058   AST 14 01/29/2018 1107   AST 16 07/31/2017 1058   ALT 12 01/29/2018 1107   ALT 10 07/31/2017 1058   BILITOT 0.3 01/29/2018 1107   BILITOT 0.40 07/31/2017 1058     EXAM: CT CHEST, ABDOMEN AND PELVIS WITHOUT CONTRAST  TECHNIQUE: Multidetector CT imaging of the chest, abdomen and pelvis was performed following the standard protocol without IV contrast.  COMPARISON:  CT 07/31/2017 and 01/26/2017.  FINDINGS: CT CHEST FINDINGS  Cardiovascular: Extensive atherosclerosis of the aorta, great vessels and coronary arteries. There is stable central dilatation of the pulmonary arteries consistent with pulmonary arterial hypertension. The heart size  is normal. There is no pericardial effusion.  Mediastinum/Nodes: There are no enlarged mediastinal, hilar or axillary lymph nodes. The thyroid gland, trachea and esophagus demonstrate no significant findings.  Lungs/Pleura: There is no pleural effusion. Stable chronic linear opacities in both lower lobes consistent with scarring. No recurrent pulmonary nodule or confluent airspace opacity. There is no endobronchial lesion.  Musculoskeletal/Chest wall: No chest wall mass or suspicious osseous findings. There are glenohumeral degenerative changes and degenerative changes throughout the thoracic spine. Mild bilateral gynecomastia. Right shoulder muscular atrophy with surrounding bursal fluid collections.  CT ABDOMEN AND PELVIS FINDINGS  Hepatobiliary: No focal hepatic abnormalities on noncontrast imaging. No significant biliary dilatation status post cholecystectomy.  Pancreas: Unremarkable. No pancreatic ductal dilatation or surrounding inflammatory changes.  Spleen: Normal in size without focal abnormality.  Adrenals/Urinary Tract: Both adrenal glands appear normal. The left nephrectomy bed appears stable without recurrent mass lesion. The right kidney has a stable appearance without evidence mass lesion or hydronephrosis on  noncontrast imaging. There is no urinary tract calculus. Bladder wall trabeculation and small diverticula are grossly stable.  Stomach/Bowel: No evidence of bowel wall thickening, distention or surrounding inflammatory change.  Vascular/Lymphatic: There are no enlarged abdominal lymph nodes. There are stable prominent external iliac lymph nodes bilaterally (images 105 through 109/2). There is diffuse aortic and branch vessel atherosclerosis.  Reproductive: The prostate gland and seminal vesicles appear stable.  Other: No residual extension of small bowel into a small umbilical hernia. Stable mild soft tissue stranding in the mesenteric  fat.  Musculoskeletal: No acute or significant osseous findings. Severe degenerative changes of both hips. There are severe degenerative changes throughout the lumbar spine with probable multilevel spinal stenosis. Multifocal muscular atrophy, notably at the right shoulder and both hips.  IMPRESSION: 1. Stable examination without evidence local recurrence or metastatic disease. 2. Stable linear scarring at both lung bases. 3. Extensive Aortic Atherosclerosis (ICD10-I70.0). 4. Severe spondylosis and bilateral hip arthropathy.    Impression and Plan:  84 year old man with:  1.  Stage IV renal cell carcinoma without any active disease at this time.  His initial diagnosis was in 2013 and had a nephrectomy he had recurrence with pulmonary metastasis in 2016 that was radiated effectively.  He continues to be without active disease at this time.  CT scan obtained on 01/29/2018 was personally reviewed and discussed with the patient.  Continues to show no evidence of recurrent disease.  The natural course of this disease was discussed today with the patient and his daughter.  Despite his scans continue to show no evidence of active disease he is at high risk of developing recurrence.  Isolated metastasis can be treated surgically or by radiation in the future.  Based on these discussions today, he is agreeable to continue with active surveillance and repeat imaging studies in 6 months.  2.  Lung nodules: Related to metastatic renal cell carcinoma.  CT scan on 01/27/2018 showed no recent nodules.  3. Follow-up: Will be in 6 months and repeat imaging studies.  15  minutes was spent with the patient face-to-face today.  More than 50% of time was dedicated to patient counseling, education and discussing the natural course of this disease and future treatment options.   Zola Button, MD 3/26/201910:43 AM

## 2018-02-02 NOTE — Telephone Encounter (Signed)
Appointments scheduled AVS/Calendar printed per 3/26 los °

## 2018-03-08 ENCOUNTER — Ambulatory Visit (INDEPENDENT_AMBULATORY_CARE_PROVIDER_SITE_OTHER): Payer: Medicare Other | Admitting: Podiatry

## 2018-03-08 DIAGNOSIS — B351 Tinea unguium: Secondary | ICD-10-CM

## 2018-03-08 DIAGNOSIS — M79676 Pain in unspecified toe(s): Secondary | ICD-10-CM

## 2018-03-08 DIAGNOSIS — L309 Dermatitis, unspecified: Secondary | ICD-10-CM

## 2018-03-09 ENCOUNTER — Telehealth: Payer: Self-pay | Admitting: *Deleted

## 2018-03-09 DIAGNOSIS — L309 Dermatitis, unspecified: Secondary | ICD-10-CM

## 2018-03-09 NOTE — Progress Notes (Signed)
   SUBJECTIVE Patient presents to office today complaining of elongated, thickened nails that cause pain while ambulating in shoes. He is unable to trim his own nails.  He also complains of dry, scabbing skin to the left anterior lower leg that began a few weeks ago. He has not done anything for treatment and there are no modifying factors noted. Patient is here for further evaluation and treatment.  Past Medical History:  Diagnosis Date  . Anemia   . Arthritis    stenosis - back, neck  . Cancer [facility])    renal neoplasm  . Chronic kidney disease    renalCa- nephrectomy [facility]) 04/2012  . Coronary artery disease   . GERD (gastroesophageal reflux disease)   . Hard of hearing   . Hearing deficit    hearing aids- bilateral  . Hypertension    sees Dr. Wynonia Lawman, stress test recently for prep for surgery  . Hypothyroidism   . Lung cancer ([place])    right lower lobe nodule suspicious for metastatic clear cell renal cell neoplasm  . Prediabetes   . Vitamin D deficiency     OBJECTIVE General Patient is awake, alert, and oriented x 3 and in no acute distress. Derm Dermatitis with well circumscribed border noted encompassing left leg. Skin is dry and supple bilateral. Negative open lesions or macerations. Remaining integument unremarkable. Nails are tender, long, thickened and dystrophic with subungual debris, consistent with onychomycosis, 1-5 bilateral. No signs of infection noted. Vasc  DP and PT pedal pulses palpable bilaterally. Temperature gradient within normal limits.  Neuro Epicritic and protective threshold sensation diminished bilaterally.  Musculoskeletal Exam No symptomatic pedal deformities noted bilateral. Muscular strength within normal limits.      ASSESSMENT 1. Onychodystrophic nails 1-5 bilateral with hyperkeratosis of nails.  2. Onychomycosis of nail due to dermatophyte bilateral 3. Pain in foot bilateral 4. Left leg dermatitis   PLAN OF CARE 1. Patient evaluated today.   2. Instructed to maintain good pedal hygiene and foot care.  3. Mechanical debridement of nails 1-5 bilaterally performed using a nail nipper. Filed with dremel without incident.  4. Referral to dermatology to evaluate left leg dermatitis.  5. Return to clinic in 3 mos.    Edrick Kins, DPM [facility]  Dr. Edrick Kins, [address]                                        [place], [place] [address]                Office [phone]  Fax [phone]

## 2018-03-09 NOTE — Telephone Encounter (Signed)
Faxed referral, clinicals and demographics to [facility].

## 2018-03-09 NOTE — Telephone Encounter (Signed)
-----   Message from Edrick Kins, DPM sent at 03/08/2018  2:45 PM EDT ----- Regarding: Dermatology referral Please refer patient to dermatology.   Diagnosis: Dermatitis left leg  Thanks, Dr. Amalia Hailey

## 2018-03-17 ENCOUNTER — Other Ambulatory Visit: Payer: Self-pay

## 2018-03-17 DIAGNOSIS — Z1212 Encounter for screening for malignant neoplasm of rectum: Secondary | ICD-10-CM

## 2018-03-17 DIAGNOSIS — Z1211 Encounter for screening for malignant neoplasm of colon: Secondary | ICD-10-CM

## 2018-03-17 LAB — POC HEMOCCULT BLD/STL (HOME/3-CARD/SCREEN)
Card #2 Fecal Occult Blod, POC: NEGATIVE
FECAL OCCULT BLD: NEGATIVE
FECAL OCCULT BLD: NEGATIVE

## 2018-05-14 ENCOUNTER — Other Ambulatory Visit: Payer: Self-pay

## 2018-05-14 ENCOUNTER — Emergency Department (HOSPITAL_COMMUNITY): Payer: Medicare Other

## 2018-05-14 ENCOUNTER — Encounter (HOSPITAL_COMMUNITY): Payer: Self-pay | Admitting: Emergency Medicine

## 2018-05-14 ENCOUNTER — Inpatient Hospital Stay (HOSPITAL_COMMUNITY)
Admission: EM | Admit: 2018-05-14 | Discharge: 2018-05-24 | DRG: 189 | Disposition: A | Discharge status: Home Health | Payer: Medicare Other | Attending: Internal Medicine | Admitting: Internal Medicine

## 2018-05-14 DIAGNOSIS — D508 Other iron deficiency anemias: Secondary | ICD-10-CM | POA: Diagnosis not present

## 2018-05-14 DIAGNOSIS — I10 Essential (primary) hypertension: Secondary | ICD-10-CM | POA: Diagnosis present

## 2018-05-14 DIAGNOSIS — Z79899 Other long term (current) drug therapy: Secondary | ICD-10-CM

## 2018-05-14 DIAGNOSIS — L8990 Pressure ulcer of unspecified site, unspecified stage: Secondary | ICD-10-CM

## 2018-05-14 DIAGNOSIS — M1990 Unspecified osteoarthritis, unspecified site: Secondary | ICD-10-CM | POA: Diagnosis present

## 2018-05-14 DIAGNOSIS — Z66 Do not resuscitate: Secondary | ICD-10-CM | POA: Diagnosis present

## 2018-05-14 DIAGNOSIS — J9600 Acute respiratory failure, unspecified whether with hypoxia or hypercapnia: Secondary | ICD-10-CM | POA: Diagnosis present

## 2018-05-14 DIAGNOSIS — I2510 Atherosclerotic heart disease of native coronary artery without angina pectoris: Secondary | ICD-10-CM | POA: Diagnosis present

## 2018-05-14 DIAGNOSIS — I472 Ventricular tachycardia: Secondary | ICD-10-CM | POA: Diagnosis not present

## 2018-05-14 DIAGNOSIS — J209 Acute bronchitis, unspecified: Secondary | ICD-10-CM | POA: Diagnosis present

## 2018-05-14 DIAGNOSIS — I129 Hypertensive chronic kidney disease with stage 1 through stage 4 chronic kidney disease, or unspecified chronic kidney disease: Secondary | ICD-10-CM | POA: Diagnosis present

## 2018-05-14 DIAGNOSIS — J441 Chronic obstructive pulmonary disease with (acute) exacerbation: Secondary | ICD-10-CM | POA: Diagnosis present

## 2018-05-14 DIAGNOSIS — J45901 Unspecified asthma with (acute) exacerbation: Secondary | ICD-10-CM

## 2018-05-14 DIAGNOSIS — I700 Atherosclerosis of aorta: Secondary | ICD-10-CM | POA: Diagnosis present

## 2018-05-14 DIAGNOSIS — H9190 Unspecified hearing loss, unspecified ear: Secondary | ICD-10-CM | POA: Diagnosis present

## 2018-05-14 DIAGNOSIS — E8770 Fluid overload, unspecified: Secondary | ICD-10-CM | POA: Diagnosis present

## 2018-05-14 DIAGNOSIS — J45909 Unspecified asthma, uncomplicated: Secondary | ICD-10-CM | POA: Diagnosis present

## 2018-05-14 DIAGNOSIS — I509 Heart failure, unspecified: Secondary | ICD-10-CM | POA: Diagnosis not present

## 2018-05-14 DIAGNOSIS — K219 Gastro-esophageal reflux disease without esophagitis: Secondary | ICD-10-CM | POA: Diagnosis present

## 2018-05-14 DIAGNOSIS — Z800 Family history of malignant neoplasm of digestive organs: Secondary | ICD-10-CM

## 2018-05-14 DIAGNOSIS — Z7982 Long term (current) use of aspirin: Secondary | ICD-10-CM

## 2018-05-14 DIAGNOSIS — D649 Anemia, unspecified: Secondary | ICD-10-CM | POA: Diagnosis present

## 2018-05-14 DIAGNOSIS — Z993 Dependence on wheelchair: Secondary | ICD-10-CM

## 2018-05-14 DIAGNOSIS — Z87891 Personal history of nicotine dependence: Secondary | ICD-10-CM

## 2018-05-14 DIAGNOSIS — Z85528 Personal history of other malignant neoplasm of kidney: Secondary | ICD-10-CM

## 2018-05-14 DIAGNOSIS — Z803 Family history of malignant neoplasm of breast: Secondary | ICD-10-CM

## 2018-05-14 DIAGNOSIS — R0602 Shortness of breath: Secondary | ICD-10-CM

## 2018-05-14 DIAGNOSIS — I872 Venous insufficiency (chronic) (peripheral): Secondary | ICD-10-CM | POA: Diagnosis present

## 2018-05-14 DIAGNOSIS — J9601 Acute respiratory failure with hypoxia: Secondary | ICD-10-CM | POA: Diagnosis not present

## 2018-05-14 DIAGNOSIS — R011 Cardiac murmur, unspecified: Secondary | ICD-10-CM | POA: Diagnosis present

## 2018-05-14 DIAGNOSIS — Z981 Arthrodesis status: Secondary | ICD-10-CM

## 2018-05-14 DIAGNOSIS — C649 Malignant neoplasm of unspecified kidney, except renal pelvis: Secondary | ICD-10-CM

## 2018-05-14 DIAGNOSIS — R195 Other fecal abnormalities: Secondary | ICD-10-CM | POA: Diagnosis present

## 2018-05-14 DIAGNOSIS — Y92230 Patient room in hospital as the place of occurrence of the external cause: Secondary | ICD-10-CM | POA: Diagnosis not present

## 2018-05-14 DIAGNOSIS — D72829 Elevated white blood cell count, unspecified: Secondary | ICD-10-CM | POA: Diagnosis not present

## 2018-05-14 DIAGNOSIS — R0603 Acute respiratory distress: Secondary | ICD-10-CM

## 2018-05-14 DIAGNOSIS — J189 Pneumonia, unspecified organism: Secondary | ICD-10-CM | POA: Diagnosis present

## 2018-05-14 DIAGNOSIS — Z6841 Body Mass Index (BMI) 40.0 and over, adult: Secondary | ICD-10-CM

## 2018-05-14 DIAGNOSIS — Z96698 Presence of other orthopedic joint implants: Secondary | ICD-10-CM | POA: Diagnosis present

## 2018-05-14 DIAGNOSIS — Z95811 Presence of heart assist device: Secondary | ICD-10-CM

## 2018-05-14 DIAGNOSIS — C7800 Secondary malignant neoplasm of unspecified lung: Secondary | ICD-10-CM | POA: Diagnosis present

## 2018-05-14 DIAGNOSIS — Z9861 Coronary angioplasty status: Secondary | ICD-10-CM

## 2018-05-14 DIAGNOSIS — J440 Chronic obstructive pulmonary disease with acute lower respiratory infection: Secondary | ICD-10-CM | POA: Diagnosis present

## 2018-05-14 DIAGNOSIS — T380X5A Adverse effect of glucocorticoids and synthetic analogues, initial encounter: Secondary | ICD-10-CM | POA: Diagnosis not present

## 2018-05-14 DIAGNOSIS — Z96653 Presence of artificial knee joint, bilateral: Secondary | ICD-10-CM | POA: Diagnosis present

## 2018-05-14 DIAGNOSIS — Z9841 Cataract extraction status, right eye: Secondary | ICD-10-CM

## 2018-05-14 DIAGNOSIS — N179 Acute kidney failure, unspecified: Secondary | ICD-10-CM | POA: Diagnosis present

## 2018-05-14 DIAGNOSIS — M216X1 Other acquired deformities of right foot: Secondary | ICD-10-CM | POA: Diagnosis present

## 2018-05-14 DIAGNOSIS — E1122 Type 2 diabetes mellitus with diabetic chronic kidney disease: Secondary | ICD-10-CM | POA: Diagnosis present

## 2018-05-14 DIAGNOSIS — L89301 Pressure ulcer of unspecified buttock, stage 1: Secondary | ICD-10-CM | POA: Diagnosis present

## 2018-05-14 DIAGNOSIS — I878 Other specified disorders of veins: Secondary | ICD-10-CM | POA: Diagnosis present

## 2018-05-14 DIAGNOSIS — Z9049 Acquired absence of other specified parts of digestive tract: Secondary | ICD-10-CM

## 2018-05-14 DIAGNOSIS — M109 Gout, unspecified: Secondary | ICD-10-CM | POA: Diagnosis present

## 2018-05-14 DIAGNOSIS — Z79890 Hormone replacement therapy (postmenopausal): Secondary | ICD-10-CM

## 2018-05-14 DIAGNOSIS — G894 Chronic pain syndrome: Secondary | ICD-10-CM | POA: Diagnosis present

## 2018-05-14 DIAGNOSIS — Z905 Acquired absence of kidney: Secondary | ICD-10-CM

## 2018-05-14 DIAGNOSIS — N184 Chronic kidney disease, stage 4 (severe): Secondary | ICD-10-CM

## 2018-05-14 DIAGNOSIS — K649 Unspecified hemorrhoids: Secondary | ICD-10-CM | POA: Diagnosis present

## 2018-05-14 DIAGNOSIS — E039 Hypothyroidism, unspecified: Secondary | ICD-10-CM | POA: Diagnosis present

## 2018-05-14 DIAGNOSIS — E785 Hyperlipidemia, unspecified: Secondary | ICD-10-CM | POA: Diagnosis present

## 2018-05-14 DIAGNOSIS — E782 Mixed hyperlipidemia: Secondary | ICD-10-CM

## 2018-05-14 LAB — CBC
HEMATOCRIT: 29.8 % — AB (ref 39.0–52.0)
Hemoglobin: 9.5 g/dL — ABNORMAL LOW (ref 13.0–17.0)
MCH: 31.5 pg (ref 26.0–34.0)
MCHC: 31.9 g/dL (ref 30.0–36.0)
MCV: 98.7 fL (ref 78.0–100.0)
PLATELETS: 241 10*3/uL (ref 150–400)
RBC: 3.02 MIL/uL — AB (ref 4.22–5.81)
RDW: 14.6 % (ref 11.5–15.5)
WBC: 10 10*3/uL (ref 4.0–10.5)

## 2018-05-14 LAB — BASIC METABOLIC PANEL
Anion gap: 9 (ref 5–15)
BUN: 50 mg/dL — AB (ref 8–23)
CO2: 24 mmol/L (ref 22–32)
CREATININE: 2.23 mg/dL — AB (ref 0.61–1.24)
Calcium: 8.6 mg/dL — ABNORMAL LOW (ref 8.9–10.3)
Chloride: 104 mmol/L (ref 98–111)
GFR calc non Af Amer: 25 mL/min — ABNORMAL LOW (ref >60)
GFR, EST AFRICAN AMERICAN: 29 mL/min — AB (ref >60)
Glucose, Bld: 157 mg/dL — ABNORMAL HIGH (ref 70–99)
Potassium: 5.1 mmol/L (ref 3.5–5.1)
Sodium: 137 mmol/L (ref 135–145)

## 2018-05-14 LAB — MAGNESIUM: Magnesium: 2.4 mg/dL (ref 1.7–2.4)

## 2018-05-14 LAB — I-STAT TROPONIN, ED: TROPONIN I, POC: 0.02 ng/mL (ref 0.00–0.08)

## 2018-05-14 LAB — POC OCCULT BLOOD, ED: Fecal Occult Bld: POSITIVE — AB

## 2018-05-14 LAB — BRAIN NATRIURETIC PEPTIDE: B Natriuretic Peptide: 312.9 pg/mL — ABNORMAL HIGH (ref 0.0–100.0)

## 2018-05-14 NOTE — ED Provider Notes (Signed)
[facility] EMERGENCY DEPARTMENT Provider Note   CSN: [id number] Arrival date & time: 05/14/18  2046     History   Chief Complaint Chief Complaint  Patient presents with  . Shortness of Breath    HPI [name] is a 84 y.o. male.  He is here with increased shortness of breath over the past few weeks.  He is very hard of hearing and his daughter is giving most of the history.  He admits to a cough denies any chest pain.  Says the cough is been nonproductive.  There is been no fever.  She states that he has had increased difficulty with any kind of exertion and he falls asleep a lot.  They have seen their cardiologist very recently and were told everything is going well.  He has had some chronic edema in his legs and may be a little bit worse recently.  Does not sound like to check daily weights to see if he is up all.  The history is provided by the patient and a relative.  Shortness of Breath  This is a recurrent problem. The average episode lasts 2 weeks. The problem has been gradually worsening. Associated symptoms include cough, wheezing and leg swelling. Pertinent negatives include no fever, no headaches, no rhinorrhea, no sore throat, no neck pain, no sputum production, no hemoptysis, no chest pain, no vomiting and no rash. It is unknown what precipitated the problem. He has tried nothing for the symptoms. The treatment provided no relief. Associated medical issues include CAD.    Past Medical History:  Diagnosis Date  . Anemia   . Arthritis    stenosis - back, neck  . Cancer [facility])    renal neoplasm  . Chronic kidney disease    renalCa- nephrectomy [facility]) 04/2012  . Coronary artery disease   . GERD (gastroesophageal reflux disease)   . Hard of hearing   . Hearing deficit    hearing aids- bilateral  . Hypertension    sees Dr. Wynonia Lawman, stress test recently for prep for surgery  . Hypothyroidism   . 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 Prediabetes   . Vitamin D deficiency     Patient Active Problem List   Diagnosis Date Noted  . Anemia 09/15/2016  . Chronic pain syndrome   . Essential hypertension   . Atherosclerosis of aorta ([place]) 05/30/2016  . 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 Non compliance with medical treatment 08/20/2015  . Gout 08/18/2015  . CAD (coronary artery disease), native coronary artery   . Morbid obesity ([place])   . Spinal stenosis   . Medication management 01/23/2014  . Hyperlipidemia 11/24/2013  . DJD   . Cancer of kidney ([place])   . GERD (gastroesophageal reflux disease)   . CKD, stage IV (GFR 28 ml/min) ([facility])   . Hypothyroidism   . Vitamin D deficiency   . Prediabetes     Past Surgical History:  Procedure Laterality Date  . ANTERIOR CERVICAL DECOMP/DISCECTOMY FUSION N/A 01/18/2013   Procedure: ANTERIOR CERVICAL DECOMPRESSION/DISCECTOMY FUSION 2 LEVELS;  Surgeon: Faythe Ghee, MD;  Location: [facility];  Service: Neurosurgery;  Laterality: N/A;  . APPENDECTOMY    . BACK SURGERY     lower back surgery - 1990's  . CHOLECYSTECTOMY    . CORONARY ANGIOPLASTY     stents , + 7-57yrs. ago  .  EYE SURGERY     right eye cataract surgery   . I&D EXTREMITY Right 09/08/2016   Procedure: IRRIGATION AND DEBRIDEMENT RIGHT FOOT WITH ANTIBIOTIC BEADS;  Surgeon: Edrick Kins, DPM;  Location: [facility];  Service: Podiatry;  Laterality: Right;  . JOINT REPLACEMENT     bilateral knee replacements   . LAPAROSCOPIC NEPHRECTOMY  04/29/2012   Procedure: LAPAROSCOPIC NEPHRECTOMY;  Surgeon: Dutch Gray, MD;  Location: [facility];  Service: Urology;  Laterality: Left;      . TOE ARTHROPLASTY Right 09/08/2016   Procedure: Arthroplasty of First MPJ RIGHT FOOT;  Surgeon: Edrick Kins, DPM;  Location: [facility];  Service: Podiatry;  Laterality: Right;  . TONSILLECTOMY    . WOUND DEBRIDEMENT Right 09/08/2016   Procedure:  DEBRIDEMENT RIGHT ANKLE WOUND;  Surgeon: Edrick Kins, DPM;  Location: [facility];  Service: Podiatry;  Laterality: Right;        Home Medications    Prior to Admission medications   Medication Sig Start Date End Date Taking? Authorizing Provider  allopurinol (ZYLOPRIM) 300 MG tablet TAKE 1 TABLET DAILY 09/26/17   Vicie Mutters, PA-C  amLODipine (NORVASC) 5 MG tablet TAKE 1 TABLET DAILY BEFORE BREAKFAST 08/24/17   Unk Pinto, MD  aspirin EC 81 MG tablet Take 81 mg by mouth daily.    [provider]  bumetanide (BUMEX) 2 MG tablet TAKE ONE-HALF (1/2) TO ONE TABLET DAILY FOR FLUID 01/17/18   [name], NP  Cholecalciferol (VITAMIN D) 2000 units tablet Take by mouth.    [provider]  clobetasol cream (TEMOVATE) 0.96 % Apply 1 application topically 2 (two) times daily. 12/07/17   Edrick Kins, DPM  FERROUS SULFATE PO Take 1 tablet by mouth 3 (three) times daily.     [provider]  levothyroxine (SYNTHROID, LEVOTHROID) 150 MCG tablet TAKE 1 TABLET DAILY BUT ONE AND ONE-HALF TABLETS ON SUNDAY. TAKE 1 HOUR BEFORE FOOD, ONLY WITH WATER 01/30/18   Unk Pinto, MD  Magnesium 250 MG TABS Take 500 mg by mouth daily.     [provider]  menthol-cetylpyridinium (CEPACOL) 3 MG lozenge Take 1 lozenge (3 mg total) by mouth as needed for sore throat. 09/12/16   Eugenie Filler, MD  Multiple Vitamin (MULITIVITAMIN WITH MINERALS) TABS Take 1 tablet by mouth daily.    [provider]  nitroGLYCERIN (NITROSTAT) 0.4 MG SL tablet Place 0.4 mg under the tongue every 5 (five) minutes as needed for chest pain.     [provider]  pravastatin (PRAVACHOL) 40 MG tablet TAKE 1 TABLET AT BEDTIME 06/07/17   Vicie Mutters, PA-C  vitamin C (ASCORBIC ACID) 500 MG tablet Take 1,000 mg by mouth daily.     [provider]    Family History Family History  Problem Relation Age of Onset  . Cancer Sister        breast  . Cancer Brother         colon    Social History Social History   Tobacco Use  . Smoking status: Former Smoker    Types: Cigarettes    Last attempt to quit: 11/10/1970    Years since quitting: 47.5  . Smokeless tobacco: Never Used  Substance Use Topics  . Alcohol use: Yes    Alcohol/week: 6.6 oz    Types: 2 Cans of beer, 9 Shots of liquor per week    Comment: friday- beer & 3 drambuie  . Drug use: No     Allergies  Patient has no known allergies.   Review of Systems Review of Systems  Constitutional: Negative for fever.  HENT: Negative for rhinorrhea and sore throat.   Eyes: Negative for pain and visual disturbance.  Respiratory: Positive for cough, shortness of breath and wheezing. Negative for hemoptysis and sputum production.   Cardiovascular: Positive for leg swelling. Negative for chest pain.  Gastrointestinal: Negative for nausea and vomiting.  Genitourinary: Negative for dysuria and frequency.  Musculoskeletal: Negative for neck pain.  Skin: Negative for rash.  Neurological: Negative for headaches.     Physical Exam Updated Vital Signs BP (!) 149/62 (BP Location: Right Arm)   Pulse 73   Resp 14   Ht 5\' 9"  (1.753 m)   Wt 114.8 kg (253 lb)   SpO2 90%   BMI 37.36 kg/m   Physical Exam  Constitutional: He appears well-developed and well-nourished.  HENT:  Head: Normocephalic and atraumatic.  Eyes: Pupils are equal, round, and reactive to light. Conjunctivae and EOM are normal.  Neck: Neck supple.  Cardiovascular: Normal rate and normal heart sounds.  Pulmonary/Chest: Accessory muscle usage present. Tachypnea noted. No respiratory distress. He has decreased breath sounds.  Abdominal: Soft. He exhibits no mass. There is no rebound and no guarding.  Musculoskeletal:       Right lower leg: He exhibits edema.       Left lower leg: He exhibits edema.  Neurological: He is alert. GCS eye subscore is 4. GCS verbal subscore is 5. GCS motor subscore is 6.  Skin: Skin is warm and dry.    Psychiatric: He has a normal mood and affect.  Nursing note and vitals reviewed.    ED Treatments / Results  Labs (all labs ordered are listed, but only abnormal results are displayed) Labs Reviewed  BASIC METABOLIC PANEL - Abnormal; Notable for the following components:      Result Value   Glucose, Bld 157 (*)    BUN 50 (*)    Creatinine, Ser 2.23 (*)    Calcium 8.6 (*)    GFR calc non Af Amer 25 (*)    GFR calc Af Amer 29 (*)    All other components within normal limits  BRAIN NATRIURETIC PEPTIDE - Abnormal; Notable for the following components:   B Natriuretic Peptide 312.9 (*)    All other components within normal limits  CBC - Abnormal; Notable for the following components:   RBC 3.02 (*)    Hemoglobin 9.5 (*)    HCT 29.8 (*)    All other components within normal limits  URINALYSIS, ROUTINE W REFLEX MICROSCOPIC - Abnormal; Notable for the following components:   APPearance HAZY (*)    Leukocytes, UA TRACE (*)    Bacteria, UA RARE (*)    All other components within normal limits  VITAMIN B12 - Abnormal; Notable for the following components:   Vitamin B-12 1,240 (*)    All other components within normal limits  IRON AND TIBC - Abnormal; Notable for the following components:   Iron 25 (*)    Saturation Ratios 7 (*)    All other components within normal limits  RETICULOCYTES - Abnormal; Notable for the following components:   RBC. 2.96 (*)    All other components within normal limits  TROPONIN I - Abnormal; Notable for the following components:   Troponin I 0.03 (*)    All other components within normal limits  TROPONIN I - Abnormal; Notable for the following components:   Troponin I 0.03 (*)  All other components within normal limits  COMPREHENSIVE METABOLIC PANEL - Abnormal; Notable for the following components:   Glucose, Bld 154 (*)    BUN 46 (*)    Creatinine, Ser 2.12 (*)    Calcium 8.6 (*)    Albumin 2.9 (*)    GFR calc non Af Amer 26 (*)    GFR calc  Af Amer 31 (*)    All other components within normal limits  CBC - Abnormal; Notable for the following components:   RBC 2.96 (*)    Hemoglobin 9.3 (*)    HCT 29.3 (*)    All other components within normal limits  HEMOGLOBIN A1C - Abnormal; Notable for the following components:   Hgb A1c MFr Bld 6.2 (*)    All other components within normal limits  GLUCOSE, CAPILLARY - Abnormal; Notable for the following components:   Glucose-Capillary 132 (*)    All other components within normal limits  GLUCOSE, CAPILLARY - Abnormal; Notable for the following components:   Glucose-Capillary 160 (*)    All other components within normal limits  POC OCCULT BLOOD, ED - Abnormal; Notable for the following components:   Fecal Occult Bld POSITIVE (*)    All other components within normal limits  I-STAT VENOUS BLOOD GAS, ED - Abnormal; Notable for the following components:   pCO2, Ven 42.5 (*)    pO2, Ven 66.0 (*)    All other components within normal limits  MRSA PCR SCREENING  MAGNESIUM  FOLATE  FERRITIN  MAGNESIUM  PHOSPHORUS  TSH  TROPONIN I  BLOOD GAS, VENOUS  I-STAT TROPONIN, ED    EKG EKG Interpretation  Date/Time:  Friday May 14 2018 20:53:56 EDT Ventricular Rate:  74 PR Interval:    QRS Duration: 115 QT Interval:  429 QTC Calculation: 476 R Axis:   76 Text Interpretation:  Sinus rhythm Short PR interval Nonspecific intraventricular conduction delay Low voltage, precordial leads Nonspecific T abnormalities, lateral leads Baseline wander in lead(s) V1 V3 similar pattern to prior 8/09 Confirmed by Aletta Edouard [phone]) on 05/14/2018 9:09:09 PM   Radiology Dg Chest Portable 1 View  Result Date: 05/14/2018 CLINICAL DATA:  Shortness of breath. EXAM: PORTABLE CHEST 1 VIEW COMPARISON:  CT chest 01/29/2018.  Chest x-ray 07/30/2015. FINDINGS: 2116 hours. Low lung volumes. Cardiopericardial silhouette is at upper limits of normal for size. There is pulmonary vascular congestion without  overt pulmonary edema. Diffuse underlying interstitial opacity is associated with atelectasis at the bases with possible small left pleural effusion. The visualized bony structures of the thorax are intact. Telemetry leads overlie the chest. IMPRESSION: 1. Upper normal heart size and vascular congestion. 2. Underlying chronic interstitial scars that underlying interstitial opacity may be chronic or related to interstitial edema. 3. Bibasilar atelectasis with possible tiny left pleural effusion. Electronically Signed   By: Misty Stanley M.D.   On: 05/14/2018 21:29    Procedures Procedures (including critical care time)  Medications Ordered in ED Medications - No data to display   Initial Impression / Assessment and Plan / ED Course  I have reviewed the triage vital signs and the nursing notes.  Pertinent labs & imaging results that were available during my care of the patient were reviewed by me and considered in my medical decision making (see chart for details).  Clinical Course as of May 16 923  Fri May 14, 2018  2153 Patient with increased somnolence increased shortness of breath over the past few weeks.  He is definitely  working to breathe he is got significant peripheral edema.  His labs are coming back initial troponin negative.  His hemoglobin is lower than his baseline, just did a rectal exam and sent that off for guaiac.  He is pending his chemistry and BNP.   [MB]  2250 Patient's creatinine is at baseline but he is more anemic and he is guaiac positive although had nothing in the vault.  His BNP is elevated but there is no baseline to compare with.  I do not see any prior cardiac echoes on him.  He is so chronically debilitated at baseline I think he should be admitted to the hospital for some diuresis and may be evaluation of his medications to see if he can be tuned up.   [MB]    Clinical Course User Index [MB] Hayden Rasmussen, MD    Final Clinical Impressions(s) / ED  Diagnoses   Final diagnoses:  Acute congestive heart failure, unspecified heart failure type [facility])    ED Discharge Orders    None       Hayden Rasmussen, MD 05/15/18 [phone]

## 2018-05-14 NOTE — ED Notes (Signed)
Temperature reading not processing via oral or axillary. Rectal temp taken. Pt feels warm to touch. Not reporting chills. MD Melina Copa aware. Warm blankets applied.

## 2018-05-14 NOTE — ED Notes (Signed)
ED Provider at bedside. 

## 2018-05-14 NOTE — H&P (Signed)
[name]:[id number] DOB: [birth year] DOA: 05/14/2018     PCP: Unk Pinto, MD   Outpatient Specialists:   CARDS:  Dr. Wynonia Lawman    Oncology   Dr.Dr Alen Blew.   Patient arrived to ER on 05/14/18 at 2046  Patient coming from:   home Lives With family    Chief Complaint:  Chief Complaint  Patient presents with  . Shortness of Breath    HPI: [name] is a 84 y.o. male with medical history significant of HTN, Chronic Venous Insufficiency HLD, Prediabetes, Hypothyroidism, Hx Gout, Metastatic Renal Cell Carcinoma  s/p Nephrectomy, osteomyelitis of right foot      Presented with   progressive worsening shortness of breath and dyspnea on exertion has had occasional cough but no chest pain cough is nonproductive no associated fever has been falling asleep frequently and if any exertion he gets very short of breath he has been seen by his cardiologist who felt that he was doing well he has chronic leg edema for which she takes Bumex but slightly been getting worse. Has not   Been told in the past he has heart failure. This morning he woke up with progressively worsening shortness of breath and they called 911 patient had crackles on exam per EMS and became very tachypneic with stridor with minimal exertion.  EMS initially satting 93% on room air improved to 98% on 6 L Reports black stools but has been on iron Last colonoscopy was over 10 y ago and he opted not to repeat. Done by [name].  Regarding pertinent Chronic problems: Metastatic renal cell carcinoma to the lung treated with nephrectomy and focused radiation to the lung.  PET CT of the chest done in March 2019 showed no evidence of recurrent disease Known history of CAD status post stent in 2009 followed by cardiology History of prediabetes hemoglobin A1c was 6.0 in 2017 Of hypertension on Norvasc has been also taking Bumex for fluid has no echogram in his system While in ER:  Found to have worsening anemia and  Hemoccult positive from below CXR worrisome for fluid overload Following Medications were ordered in ER: Medications - No data to display  Significant initial  Findings: Abnormal Labs Reviewed  BASIC METABOLIC PANEL - Abnormal; Notable for the following components:      Result Value   Glucose, Bld 157 (*)    BUN 50 (*)    Creatinine, Ser 2.23 (*)    Calcium 8.6 (*)    GFR calc non Af Amer 25 (*)    GFR calc Af Amer 29 (*)    All other components within normal limits  BRAIN NATRIURETIC PEPTIDE - Abnormal; Notable for the following components:   B Natriuretic Peptide 312.9 (*)    All other components within normal limits  CBC - Abnormal; Notable for the following components:   RBC 3.02 (*)    Hemoglobin 9.5 (*)    HCT 29.8 (*)    All other components within normal limits  POC OCCULT BLOOD, ED - Abnormal; Notable for the following components:   Fecal Occult Bld POSITIVE (*)    All other components within normal limits     Na 137 K 5.1 hemolyzed  Cr    stable,    Lab Results  Component Value Date   CREATININE 2.23 (H) 05/14/2018   CREATININE 2.27 (H) 01/29/2018   CREATININE 2.22 (H) 01/26/2018      WBC  10.0  HG/HCT  Down   from baseline see below    Component Value Date/Time   HGB 9.5 (L) 05/14/2018 2113   HGB 10.6 (L) 07/31/2017 1058   HCT 29.8 (L) 05/14/2018 2113   HCT 33.1 (L) 07/31/2017 1058  in March was 11.7     Troponin (Point of Care Test) Recent Labs    05/14/18 2122  TROPIPOC 0.02       BNP (last 3 results) Recent Labs    05/14/18 2113  BNP 312.9*        Component Value Date/Time   LATICACIDVEN 1.63 09/07/2016 1603      UA ordered     CXR -stable cardiomegaly and vascular congestion and interstitial scarring    ECG:  Personally reviewed by me showing: HR : 74 Rhythm:  NSR,  Ischemic changes no evidence of ischemic changes QTC 476     ED Triage Vitals  Enc Vitals Group     BP 05/14/18 2055 (!) 149/62     Pulse  Rate 05/14/18 2055 73     Resp 05/14/18 2055 14     Temp 05/14/18 2158 (S) (!) 94.5 F (34.7 C)     Temp Source 05/14/18 2158 (S) Rectal     SpO2 05/14/18 2055 90 %     Weight 05/14/18 2101 253 lb (114.8 kg)     Height 05/14/18 2101 5\' 9"  (1.753 m)     Head Circumference --      Peak Flow --      Pain Score 05/14/18 2101 0     Pain Loc --      Pain Edu? --      Excl. in [place]? --   TMAX(24)@       Latest  Blood pressure (!) 143/83, pulse 72, temperature (S) (!) 94.5 F (34.7 C), temperature source (S) Rectal, resp. rate 18, height 5\' 9"  (1.753 m), weight 114.8 kg (253 lb), SpO2 92 %.   Hospitalist was called for admission for suspected CHF exacerbation and  symptomatic anemia   Review of Systems:    Pertinent positives include: fatigue,  Bilateral lower extremity swelling  shortness of breath at rest.dyspnea on exertion  non-productive cough wheezing.  Constitutional:  No weight loss, night sweats, Fevers, chills,  weight loss  HEENT:  No headaches, Difficulty swallowing,Tooth/dental problems,Sore throat,  No sneezing, itching, ear ache, nasal congestion, post nasal drip,  Cardio-vascular:  No chest pain, Orthopnea, PND, anasarca, dizziness, palpitations.noGI:  No heartburn, indigestion, abdominal pain, nausea, vomiting, diarrhea, change in bowel habits, loss of appetite, melena, blood in stool, hematemesis Resp:   , No excess mucus, No coughing up of blood.No change in color of mucus. No  Skin:  no rash or lesions. No jaundice GU:  no dysuria, change in color of urine, no urgency or frequency. No straining to urinate.  No flank pain.  Musculoskeletal:  No joint pain or no joint swelling. No decreased range of motion. No back pain.  Psych:  No change in mood or affect. No depression or anxiety. No memory loss.  Neuro: no localizing neurological complaints, no tingling, no weakness, no double vision, no gait abnormality, no slurred speech, no confusion  As per HPI  otherwise 10 point review of systems negative.   Past Medical History:   Past Medical History:  Diagnosis Date  . Anemia   . Arthritis    stenosis - back, neck  . Cancer [facility])    renal neoplasm  . Chronic kidney disease  renalCa- nephrectomy [facility]) 04/2012  . Coronary artery disease   . GERD (gastroesophageal reflux disease)   . Hard of hearing   . Hearing deficit    hearing aids- bilateral  . Hypertension    sees Dr. Wynonia Lawman, stress test recently for prep for surgery  . Hypothyroidism   . Lung cancer ([place])    right lower lobe nodule suspicious for metastatic clear cell renal cell neoplasm  . Prediabetes   . Vitamin D deficiency       Past Surgical History:  Procedure Laterality Date  . ANTERIOR CERVICAL DECOMP/DISCECTOMY FUSION N/A 01/18/2013   Procedure: ANTERIOR CERVICAL DECOMPRESSION/DISCECTOMY FUSION 2 LEVELS;  Surgeon: Faythe Ghee, MD;  Location: [place] NEURO ORS;  Service: Neurosurgery;  Laterality: N/A;  . APPENDECTOMY    . BACK SURGERY     lower back surgery - 1990's  . CHOLECYSTECTOMY    . CORONARY ANGIOPLASTY     stents , + 7-26yrs. ago  . EYE SURGERY     right eye cataract surgery   . I&D EXTREMITY Right 09/08/2016   Procedure: IRRIGATION AND DEBRIDEMENT RIGHT FOOT WITH ANTIBIOTIC BEADS;  Surgeon: Edrick Kins, DPM;  Location: [place];  Service: Podiatry;  Laterality: Right;  . JOINT REPLACEMENT     bilateral knee replacements   . LAPAROSCOPIC NEPHRECTOMY  04/29/2012   Procedure: LAPAROSCOPIC NEPHRECTOMY;  Surgeon: Dutch Gray, MD;  Location: [facility];  Service: Urology;  Laterality: Left;      . TOE ARTHROPLASTY Right 09/08/2016   Procedure: Arthroplasty of First MPJ RIGHT FOOT;  Surgeon: Edrick Kins, DPM;  Location: [place];  Service: Podiatry;  Laterality: Right;  . TONSILLECTOMY    . WOUND DEBRIDEMENT Right 09/08/2016   Procedure: DEBRIDEMENT RIGHT ANKLE WOUND;  Surgeon: Edrick Kins, DPM;  Location: [place];  Service: Podiatry;  Laterality: Right;     Social History:  Ambulatory  wheelchair bound      reports that he quit smoking about 47 years ago. His smoking use included cigarettes. He has never used smokeless tobacco. He reports that he drinks about 6.6 oz of alcohol per week. He reports that he does not use drugs.     Family History:   Family History  Problem Relation Age of Onset  . Cancer Sister        breast  . Cancer Brother        colon    Allergies: No Known Allergies   Prior to Admission medications   Medication Sig Start Date End Date Taking? Authorizing Provider  allopurinol (ZYLOPRIM) 300 MG tablet TAKE 1 TABLET DAILY Patient taking differently: Take 150mg  daily 09/26/17  Yes Vicie Mutters, PA-C  amLODipine (NORVASC) 10 MG tablet Take 10 mg by mouth daily. 04/16/18  Yes [provider]  aspirin EC 81 MG tablet Take 81 mg by mouth daily.   Yes [provider]  bumetanide (BUMEX) 2 MG tablet TAKE ONE-HALF (1/2) TO ONE TABLET DAILY FOR FLUID Patient taking differently: TAKE ONE-HALF (1/2) tablet DAILY FOR FLUID 01/17/18  Yes [name], NP  Cholecalciferol (VITAMIN D) 2000 units tablet Take 2,000 Units by mouth daily.    Yes [provider]  FERROUS SULFATE PO Take 25 mg by mouth daily.    Yes [provider]  levothyroxine (SYNTHROID, LEVOTHROID) 200 MCG tablet Take 200 mcg by mouth daily before breakfast.   Yes [provider]  Multiple Vitamin (MULITIVITAMIN WITH MINERALS) TABS Take 1 tablet by mouth daily.  Yes [provider]  nitroGLYCERIN (NITROSTAT) 0.4 MG SL tablet Place 0.4 mg under the tongue every 5 (five) minutes as needed for chest pain.    Yes [provider]  pravastatin (PRAVACHOL) 40 MG tablet TAKE 1 TABLET AT BEDTIME 06/07/17  Yes Vicie Mutters, PA-C  amLODipine (NORVASC) 5 MG tablet TAKE 1 TABLET DAILY BEFORE BREAKFAST Patient not taking: Reported on 05/14/2018 08/24/17   Unk Pinto, MD  clobetasol cream (TEMOVATE)  3.50 % Apply 1 application topically 2 (two) times daily. Patient not taking: Reported on 05/14/2018 12/07/17   Edrick Kins, DPM  levothyroxine (SYNTHROID, LEVOTHROID) 150 MCG tablet TAKE 1 TABLET DAILY BUT ONE AND ONE-HALF TABLETS ON SUNDAY. TAKE 1 HOUR BEFORE FOOD, ONLY WITH WATER Patient not taking: Reported on 05/14/2018 01/30/18   Unk Pinto, MD  menthol-cetylpyridinium (CEPACOL) 3 MG lozenge Take 1 lozenge (3 mg total) by mouth as needed for sore throat. Patient not taking: Reported on 05/14/2018 09/12/16   Eugenie Filler, MD   Physical Exam: Blood pressure (!) 143/83, pulse 72, temperature (S) (!) 94.5 F (34.7 C), temperature source (S) Rectal, resp. rate 18, height 5\' 9"  (1.753 m), weight 114.8 kg (253 lb), SpO2 92 %. 1. General:  in No Acute distress  Chronically ill -appearing 2. Psychological: somnolent but Oriented 3. Head/ENT:   Moist   Mucous Membranes                          Head Non traumatic, neck supple                           Poor Dentition 4. SKIN: normal   Skin turgor,  Skin clean Dry and intact no rash, evidence of venous stasis changes on lower extremity bilaterally extensive sun damage to the skin 5. Heart: Regular rate and rhythm no  Murmur, no Rub or gallop 6. Lungs: Extensive wheezes mild crackles at the bases 7. Abdomen: Soft, non-tender, Non distended   obese  bowel sounds present 8. Lower extremities: no clubbing, cyanosis, 2+ edema 9. Neurologically Grossly intact, moving all 4 extremities equally  10. MSK: Normal range of motion   LABS:     Recent Labs  Lab 05/14/18 2113  WBC 10.0  HGB 9.5*  HCT 29.8*  MCV 98.7  PLT 093   Basic Metabolic Panel: Recent Labs  Lab 05/14/18 2113  NA 137  K 5.1  CL 104  CO2 24  GLUCOSE 157*  BUN 50*  CREATININE 2.23*  CALCIUM 8.6*  MG 2.4      No results for input(s): AST, ALT, ALKPHOS, BILITOT, PROT, ALBUMIN in the last 168 hours. No results for input(s): LIPASE, AMYLASE in the last 168  hours. No results for input(s): AMMONIA in the last 168 hours.    HbA1C: No results for input(s): HGBA1C in the last 72 hours. CBG: No results for input(s): GLUCAP in the last 168 hours.    Urine analysis:    Component Value Date/Time   COLORURINE YELLOW 01/27/2018 [place] 01/27/2018 0943   LABSPEC 1.016 01/27/2018 0943   PHURINE < OR = 5.0 01/27/2018 0943   GLUCOSEU NEGATIVE 01/27/2018 0943   HGBUR NEGATIVE 01/27/2018 0943   BILIRUBINUR NEGATIVE 12/20/2013 1913   KETONESUR NEGATIVE 01/27/2018 0943   PROTEINUR 2+ (A) 01/27/2018 0943   UROBILINOGEN 0.2 12/20/2013 1913   NITRITE NEGATIVE 01/27/2018 0943   LEUKOCYTESUR NEGATIVE 01/27/2018  3154       Cultures:    Component Value Date/Time   SDES WOUND RIGHT FOOT 09/08/2016 2117   SPECREQUEST PATIENT ON FOLLOWING VANC 09/08/2016 2117   CULT  09/08/2016 2117    RARE STAPHYLOCOCCUS AUREUS NO ANAEROBES ISOLATED    REPTSTATUS 09/13/2016 FINAL 09/08/2016 2117     Radiological Exams on Admission: Dg Chest Portable 1 View  Result Date: 05/14/2018 CLINICAL DATA:  Shortness of breath. EXAM: PORTABLE CHEST 1 VIEW COMPARISON:  CT chest 01/29/2018.  Chest x-ray 07/30/2015. FINDINGS: 2116 hours. Low lung volumes. Cardiopericardial silhouette is at upper limits of normal for size. There is pulmonary vascular congestion without overt pulmonary edema. Diffuse underlying interstitial opacity is associated with atelectasis at the bases with possible small left pleural effusion. The visualized bony structures of the thorax are intact. Telemetry leads overlie the chest. IMPRESSION: 1. Upper normal heart size and vascular congestion. 2. Underlying chronic interstitial scars that underlying interstitial opacity may be chronic or related to interstitial edema. 3. Bibasilar atelectasis with possible tiny left pleural effusion. Electronically Signed   By: Misty Stanley M.D.   On: 05/14/2018 21:29    Chart has been  reviewed    Assessment/Plan  84 y.o. male with medical history significant of HTN, Chronic Venous Insufficiency HLD, Prediabetes, Hypothyroidism, Hx Gout, Metastatic Renal Cell Carcinoma  s/p Nephrectomy   Admitted for respiratory failure secondary to suspected CHF exacerbation and  symptomatic anemia   Present on Admission: . Acute respiratory failure (HCC) altered factorial combination of fluid overload versus reactive airway disease with known history of tobacco abuse in the past.  Obtain echogram cycle cardiac enzymes but also treat for possible COPD exacerbation, Denies any chest pain has bilateral lower extremity swelling, history wise PE being less likely . Anemia -obtain anemia panel family states he has history of hemorrhoids has had black stools but on iron pills have refused in the past father GI work-up monitor family agreeable to transfusions if needed . CAD (coronary artery disease), native coronary artery hold aspirin for now given occult positive and worsening anemia.  Continue statin and beta-blocker . Cancer of kidney ([employer]) stable recent CT of the chest showed no new metastases . CKD, stage IV (GFR 28 ml/min) (HCC) chronic currently at baseline avoid nephrotoxic medications . Essential hypertension hold Norvasc given worsening lower extremity swelling but otherwise continue home medications . GERD (gastroesophageal reflux disease) stable continue medications . Hyperlipidemia stable continue statin . Hypothyroidism stable continue Synthroid and check TSH . Occult blood in stools patient has refused GI work-up in the past has history of hemorrhoids monitor CBC for now . Fluid overload no past history of CHF but also no echogram in the system.  Will obtain echo gentle diuresis and monitor fluid status if abnormal echogram will need cardiology consult . Reactive airway disease patient with history of tobacco abuse currently with significant wheezing.  Will treat for possible COPD  exacerbation contributed to acute respiratory failure.   Somnolence -  will check VBG for CO2 retention and treat as needed Dm 2 -  - Order  Moderate  SSI   -  check TSH and HgA1C Not on home medications Gout -  continue home medications  Other plan as per orders.  DVT prophylaxis:  SCD  Code Status:    DNR/DNI   as per  family  I had personally discussed CODE STATUS with patient and family  Family Communication:   Family  at  Bedside  plan of  care was discussed with   Daughter   Disposition Plan:   To home once workup is complete and patient is stable                        Would benefit from PT/OT eval prior to DC  Ordered family states he is not interested                                               Consults called: none would benefit from cardiology consult if abnormal echo  Admission status:   Inpatient    Level of care     tele           Toy Baker 05/15/2018, 12:30 AM    [facility]  Pager [phone]   after 2 AM please page floor coverage PA If 7AM-7PM, please contact the day team taking care of the patient  [email or web address]  Password [email or web address]

## 2018-05-14 NOTE — Progress Notes (Deleted)
FOLLOW UP  Assessment and Plan:   CAD (s/p PCA stent 2009) Has nitroglycerine; has never used Control blood pressure, cholesterol, glucose, increase exercise.  Continue follow up with cardiology  Hypertension Well controlled with current medications  Monitor blood pressure at home; patient to call if consistently greater than 130/80 Continue DASH diet.   Reminder to go to the ER if any CP, SOB, nausea, dizziness, severe HA, changes vision/speech, left arm numbness and tingling and jaw pain.  Cholesterol Currently at goal;  Continue low cholesterol diet and exercise.  Check lipid panel.   Prediabetes Continue diet and exercise.  Perform daily foot/skin check, notify office of any concerning changes.  Check A1C  Obesity with co morbidities Long discussion about weight loss, diet, and exercise Recommended diet heavy in fruits and veggies and low in animal meats, cheeses, and dairy products, appropriate calorie intake Discussed ideal weight for height and initial weight goal (***) Patient will work on *** Will follow up in 3 months  Hypothyroidism continue medications the same pending lab results reminded to take on an empty stomach 30-58mins before food.  check TSH level  Vitamin D Def At goal at last visit; continue supplementation to maintain goal of 70-100 Defer Vit D level  Gout Continue allopurinol Diet discussed Check uric acid as needed  Continue diet and meds as discussed. Further disposition pending results of labs. Discussed med's effects and SE's.   Over 30 minutes of exam, counseling, chart review, and critical decision making was performed.   Future Appointments  Date Time Provider [place]  05/17/2018 11:00 AM [name], NP [facility] None  07/19/2018  2:15 PM Edrick Kins, DPM [facility]  08/03/2018 11:00 AM [facility] 4 [facility] None  08/05/2018 12:00 PM Wyatt Portela, MD [facility] None  08/17/2018 11:00 AM Unk Pinto, MD [facility] None  03/02/2019 10:00 AM Unk Pinto, MD [facility] None    ----------------------------------------------------------------------------------------------------------------------  HPI 84 y.o. male  presents for 3 month follow up on hypertension, cholesterol, prediabetes, gout, hypothyroid, morbid obesity and vitamin D deficiency. In 2009, he Had PCA/Stent (Dr. Wynonia Lawman). Patient is s/p Rt Nephrectomy for Kidney Cancer in 2013.  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 He has chronic ongoing pain of bilateral knees; s/p bilateral TKA with multiple revisions by Dr. Percell Miller, as well as spinal stenosis. He is prescribed tramadol at this time; pain management has been discussed with him. CKD has progressed to stage 4 and established with Dr. Vanetta Mulders.   BMI is There is no height or weight on file to calculate BMI., he {HAS HAS DXI:33825} been working on diet and exercise. Wt Readings from Last 3 Encounters:  02/02/18 249 lb 12.8 oz (113.3 kg)  01/26/18 260 lb (117.9 kg)  08/05/17 260 lb 11.2 oz (118.3 kg)   His blood pressure {HAS HAS NOT:18834} been controlled at home, today their BP is    He {DOES_DOES KNL:97673} workout. He denies chest pain, shortness of breath, dizziness.   He is on cholesterol medication (pravastatin 40 mg daily ***) and denies myalgias. His cholesterol is not at goal. The cholesterol last visit was:   Lab Results  Component Value Date   CHOL 169 01/26/2018   HDL 45 01/26/2018   LDLCALC 102 (H) 01/26/2018   TRIG 119 01/26/2018   CHOLHDL 3.8 01/26/2018    He {Has/has not:18111} been working on diet and exercise for prediabetes, and denies {Symptoms;  diabetes w/o none:19199}. Last A1C in the office was:  Lab Results  Component Value Date   HGBA1C 6.0 (H) 01/26/2018   He is on thyroid medication. His medication was not changed last visit.   Lab Results  Component Value Date    TSH 0.72 01/26/2018   Patient is on Vitamin D supplement.   Lab Results  Component Value Date   VD25OH 63 01/26/2018     Patient is on allopurinol for gout and does not report a recent flare.  Lab Results  Component Value Date   LABURIC 6.0 01/26/2018      Current Medications:  Current Outpatient Medications on File Prior to Visit  Medication Sig  . allopurinol (ZYLOPRIM) 300 MG tablet TAKE 1 TABLET DAILY  . amLODipine (NORVASC) 5 MG tablet TAKE 1 TABLET DAILY BEFORE BREAKFAST  . aspirin EC 81 MG tablet Take 81 mg by mouth daily.  . bumetanide (BUMEX) 2 MG tablet TAKE ONE-HALF (1/2) TO ONE TABLET DAILY FOR FLUID  . Cholecalciferol (VITAMIN D) 2000 units tablet Take by mouth.  . clobetasol cream (TEMOVATE) 1.61 % Apply 1 application topically 2 (two) times daily.  Marland Kitchen FERROUS SULFATE PO Take 1 tablet by mouth 3 (three) times daily.   Marland Kitchen levothyroxine (SYNTHROID, LEVOTHROID) 150 MCG tablet TAKE 1 TABLET DAILY BUT ONE AND ONE-HALF TABLETS ON SUNDAY. TAKE 1 HOUR BEFORE FOOD, ONLY WITH WATER  . Magnesium 250 MG TABS Take 500 mg by mouth daily.   Marland Kitchen menthol-cetylpyridinium (CEPACOL) 3 MG lozenge Take 1 lozenge (3 mg total) by mouth as needed for sore throat.  . Multiple Vitamin (MULITIVITAMIN WITH MINERALS) TABS Take 1 tablet by mouth daily.  . nitroGLYCERIN (NITROSTAT) 0.4 MG SL tablet Place 0.4 mg under the tongue every 5 (five) minutes as needed for chest pain.   . pravastatin (PRAVACHOL) 40 MG tablet TAKE 1 TABLET AT BEDTIME  . vitamin C (ASCORBIC ACID) 500 MG tablet Take 1,000 mg by mouth daily.    No current facility-administered medications on file prior to visit.      Allergies: No Known Allergies   Medical History:  Past Medical History:  Diagnosis Date  . Anemia   . Arthritis    stenosis - back, neck  . Cancer [facility])    renal neoplasm  . Chronic kidney disease    renalCa- nephrectomy [facility]) 04/2012  . Coronary artery disease   . GERD (gastroesophageal reflux  disease)   . Hard of hearing   . Hearing deficit    hearing aids- bilateral  . Hypertension    sees Dr. Wynonia Lawman, stress test recently for prep for surgery  . Hypothyroidism   . 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 Prediabetes   . Vitamin D deficiency    Family history- Reviewed and unchanged Social history- Reviewed and unchanged   Review of Systems:  Review of Systems  Constitutional: Negative for malaise/fatigue and weight loss.  HENT: Negative for hearing loss and tinnitus.   Eyes: Negative for blurred vision and double vision.  Respiratory: Negative for cough, shortness of breath and wheezing.   Cardiovascular: Negative for chest pain, palpitations, orthopnea, claudication and leg swelling.  Gastrointestinal: Negative for abdominal pain, blood in stool, constipation, diarrhea, heartburn, melena, nausea and vomiting.  Genitourinary: Negative.   Musculoskeletal: Positive for joint pain. Negative for myalgias.  Skin: Negative for rash.  Neurological: Negative for dizziness, tingling, sensory change, weakness and headaches.  Endo/Heme/Allergies: Negative for polydipsia.  Psychiatric/Behavioral: Negative.   All other systems reviewed and are negative.     Physical Exam: There were no vitals taken for this visit. Wt Readings from Last 3 Encounters:  02/02/18 249 lb 12.8 oz (113.3 kg)  01/26/18 260 lb (117.9 kg)  08/05/17 260 lb 11.2 oz (118.3 kg)   General Appearance: Well nourished, in no apparent distress. Eyes: PERRLA, EOMs, conjunctiva no swelling or erythema Sinuses: No Frontal/maxillary tenderness ENT/Mouth: Ext aud canals clear, TMs without erythema, bulging. No erythema, swelling, or exudate on post pharynx.  Tonsils not swollen or erythematous. Hearing normal.  Neck: Supple, thyroid normal.  Respiratory: Respiratory effort normal, BS equal bilaterally without rales, rhonchi, wheezing or stridor.   Cardio: RRR with no MRGs. Brisk peripheral pulses without edema.  Abdomen: Soft, + BS.  Non tender, no guarding, rebound, hernias, masses. Lymphatics: Non tender without lymphadenopathy.  Musculoskeletal: Full ROM, symmetrical strength, limited to wheelchair Skin: Warm, dry without rashes, lesions, ecchymosis.  Neuro: Cranial nerves intact. No cerebellar symptoms.  Psych: Awake and oriented X 3, normal affect, Insight and Judgment appropriate.    Izora Ribas, NP 8:13 AM [facility]

## 2018-05-14 NOTE — ED Triage Notes (Addendum)
Per EMS, pt from home. Pt reports waking up with increased sob this am that started over 2 weeks ago. Pt denies any pain. Pt has sedentary lifestyle. Pt's family also reports pt has been falling asleep frequently throughout the day.  Increased edema in lower extremities despite diuretics. Per ems, pt had rales in lower lobes. On exertion, pt becomes tachypneic and stridor like sound in throat. Pt's family reports pt had similar episode at dinner. Pt is HOH and gets around with motorized wheelchair. Pt A&Ox4.   EMS 93% room air, placed on simple mask for exertional dyspnea. 98% on 6L simple mask O2. CBG 117. BP 157/71, HR 73.

## 2018-05-15 ENCOUNTER — Other Ambulatory Visit: Payer: Self-pay

## 2018-05-15 DIAGNOSIS — E8770 Fluid overload, unspecified: Secondary | ICD-10-CM | POA: Diagnosis present

## 2018-05-15 DIAGNOSIS — K649 Unspecified hemorrhoids: Secondary | ICD-10-CM | POA: Diagnosis present

## 2018-05-15 DIAGNOSIS — Z85528 Personal history of other malignant neoplasm of kidney: Secondary | ICD-10-CM | POA: Diagnosis not present

## 2018-05-15 DIAGNOSIS — C649 Malignant neoplasm of unspecified kidney, except renal pelvis: Secondary | ICD-10-CM | POA: Diagnosis not present

## 2018-05-15 DIAGNOSIS — R195 Other fecal abnormalities: Secondary | ICD-10-CM | POA: Diagnosis not present

## 2018-05-15 DIAGNOSIS — I509 Heart failure, unspecified: Secondary | ICD-10-CM | POA: Diagnosis present

## 2018-05-15 DIAGNOSIS — D649 Anemia, unspecified: Secondary | ICD-10-CM | POA: Diagnosis present

## 2018-05-15 DIAGNOSIS — E1122 Type 2 diabetes mellitus with diabetic chronic kidney disease: Secondary | ICD-10-CM | POA: Diagnosis present

## 2018-05-15 DIAGNOSIS — I472 Ventricular tachycardia: Secondary | ICD-10-CM | POA: Diagnosis not present

## 2018-05-15 DIAGNOSIS — H9190 Unspecified hearing loss, unspecified ear: Secondary | ICD-10-CM | POA: Diagnosis present

## 2018-05-15 DIAGNOSIS — J440 Chronic obstructive pulmonary disease with acute lower respiratory infection: Secondary | ICD-10-CM | POA: Diagnosis present

## 2018-05-15 DIAGNOSIS — I2510 Atherosclerotic heart disease of native coronary artery without angina pectoris: Secondary | ICD-10-CM | POA: Diagnosis not present

## 2018-05-15 DIAGNOSIS — Z6841 Body Mass Index (BMI) 40.0 and over, adult: Secondary | ICD-10-CM | POA: Diagnosis not present

## 2018-05-15 DIAGNOSIS — E785 Hyperlipidemia, unspecified: Secondary | ICD-10-CM | POA: Diagnosis present

## 2018-05-15 DIAGNOSIS — J9601 Acute respiratory failure with hypoxia: Secondary | ICD-10-CM | POA: Diagnosis not present

## 2018-05-15 DIAGNOSIS — E039 Hypothyroidism, unspecified: Secondary | ICD-10-CM | POA: Diagnosis present

## 2018-05-15 DIAGNOSIS — N179 Acute kidney failure, unspecified: Secondary | ICD-10-CM | POA: Diagnosis present

## 2018-05-15 DIAGNOSIS — R0600 Dyspnea, unspecified: Secondary | ICD-10-CM | POA: Diagnosis not present

## 2018-05-15 DIAGNOSIS — J441 Chronic obstructive pulmonary disease with (acute) exacerbation: Secondary | ICD-10-CM | POA: Diagnosis present

## 2018-05-15 DIAGNOSIS — C7800 Secondary malignant neoplasm of unspecified lung: Secondary | ICD-10-CM | POA: Diagnosis present

## 2018-05-15 DIAGNOSIS — J45909 Unspecified asthma, uncomplicated: Secondary | ICD-10-CM | POA: Diagnosis present

## 2018-05-15 DIAGNOSIS — I10 Essential (primary) hypertension: Secondary | ICD-10-CM | POA: Diagnosis not present

## 2018-05-15 DIAGNOSIS — N184 Chronic kidney disease, stage 4 (severe): Secondary | ICD-10-CM | POA: Diagnosis present

## 2018-05-15 DIAGNOSIS — Z905 Acquired absence of kidney: Secondary | ICD-10-CM | POA: Diagnosis not present

## 2018-05-15 DIAGNOSIS — Y92230 Patient room in hospital as the place of occurrence of the external cause: Secondary | ICD-10-CM | POA: Diagnosis not present

## 2018-05-15 DIAGNOSIS — D508 Other iron deficiency anemias: Secondary | ICD-10-CM | POA: Diagnosis not present

## 2018-05-15 DIAGNOSIS — Z95811 Presence of heart assist device: Secondary | ICD-10-CM | POA: Diagnosis not present

## 2018-05-15 DIAGNOSIS — K219 Gastro-esophageal reflux disease without esophagitis: Secondary | ICD-10-CM | POA: Diagnosis present

## 2018-05-15 DIAGNOSIS — M109 Gout, unspecified: Secondary | ICD-10-CM | POA: Diagnosis present

## 2018-05-15 DIAGNOSIS — M1990 Unspecified osteoarthritis, unspecified site: Secondary | ICD-10-CM | POA: Diagnosis present

## 2018-05-15 DIAGNOSIS — J209 Acute bronchitis, unspecified: Secondary | ICD-10-CM | POA: Diagnosis present

## 2018-05-15 DIAGNOSIS — J189 Pneumonia, unspecified organism: Secondary | ICD-10-CM | POA: Diagnosis present

## 2018-05-15 LAB — COMPREHENSIVE METABOLIC PANEL
ALT: 18 U/L (ref 0–44)
ANION GAP: 9 (ref 5–15)
AST: 19 U/L (ref 15–41)
Albumin: 2.9 g/dL — ABNORMAL LOW (ref 3.5–5.0)
Alkaline Phosphatase: 80 U/L (ref 38–126)
BUN: 46 mg/dL — ABNORMAL HIGH (ref 8–23)
CHLORIDE: 107 mmol/L (ref 98–111)
CO2: 24 mmol/L (ref 22–32)
Calcium: 8.6 mg/dL — ABNORMAL LOW (ref 8.9–10.3)
Creatinine, Ser: 2.12 mg/dL — ABNORMAL HIGH (ref 0.61–1.24)
GFR calc non Af Amer: 26 mL/min — ABNORMAL LOW (ref >60)
GFR, EST AFRICAN AMERICAN: 31 mL/min — AB (ref >60)
Glucose, Bld: 154 mg/dL — ABNORMAL HIGH (ref 70–99)
Potassium: 4.4 mmol/L (ref 3.5–5.1)
SODIUM: 140 mmol/L (ref 135–145)
Total Bilirubin: 0.7 mg/dL (ref 0.3–1.2)
Total Protein: 6.9 g/dL (ref 6.5–8.1)

## 2018-05-15 LAB — PHOSPHORUS: Phosphorus: 4.3 mg/dL (ref 2.5–4.6)

## 2018-05-15 LAB — URINALYSIS, ROUTINE W REFLEX MICROSCOPIC
BILIRUBIN URINE: NEGATIVE
GLUCOSE, UA: NEGATIVE mg/dL
Hgb urine dipstick: NEGATIVE
Ketones, ur: NEGATIVE mg/dL
Nitrite: NEGATIVE
PH: 5 (ref 5.0–8.0)
Protein, ur: NEGATIVE mg/dL
Specific Gravity, Urine: 1.011 (ref 1.005–1.030)

## 2018-05-15 LAB — CBC
HCT: 29.3 % — ABNORMAL LOW (ref 39.0–52.0)
HEMOGLOBIN: 9.3 g/dL — AB (ref 13.0–17.0)
MCH: 31.4 pg (ref 26.0–34.0)
MCHC: 31.7 g/dL (ref 30.0–36.0)
MCV: 99 fL (ref 78.0–100.0)
Platelets: 251 10*3/uL (ref 150–400)
RBC: 2.96 MIL/uL — AB (ref 4.22–5.81)
RDW: 14.6 % (ref 11.5–15.5)
WBC: 9 10*3/uL (ref 4.0–10.5)

## 2018-05-15 LAB — TROPONIN I
TROPONIN I: 0.03 ng/mL — AB (ref <0.03)
Troponin I: 0.03 ng/mL (ref <0.03)
Troponin I: 0.03 ng/mL (ref <0.03)

## 2018-05-15 LAB — GLUCOSE, CAPILLARY
GLUCOSE-CAPILLARY: 178 mg/dL — AB (ref 70–99)
GLUCOSE-CAPILLARY: 171 mg/dL — AB (ref 70–99)
Glucose-Capillary: 132 mg/dL — ABNORMAL HIGH (ref 70–99)
Glucose-Capillary: 160 mg/dL — ABNORMAL HIGH (ref 70–99)
Glucose-Capillary: 171 mg/dL — ABNORMAL HIGH (ref 70–99)

## 2018-05-15 LAB — I-STAT VENOUS BLOOD GAS, ED
ACID-BASE DEFICIT: 1 mmol/L (ref 0.0–2.0)
BICARBONATE: 24.7 mmol/L (ref 20.0–28.0)
O2 Saturation: 92 %
TCO2: 26 mmol/L (ref 22–32)
pCO2, Ven: 42.5 mmHg — ABNORMAL LOW (ref 44.0–60.0)
pH, Ven: 7.373 (ref 7.250–7.430)
pO2, Ven: 66 mmHg — ABNORMAL HIGH (ref 32.0–45.0)

## 2018-05-15 LAB — IRON AND TIBC
Iron: 25 ug/dL — ABNORMAL LOW (ref 45–182)
Saturation Ratios: 7 % — ABNORMAL LOW (ref 17.9–39.5)
TIBC: 336 ug/dL (ref 250–450)
UIBC: 311 ug/dL

## 2018-05-15 LAB — TSH: TSH: 2.35 u[IU]/mL (ref 0.350–4.500)

## 2018-05-15 LAB — RETICULOCYTES
RBC.: 2.96 MIL/uL — AB (ref 4.22–5.81)
RETIC CT PCT: 2.2 % (ref 0.4–3.1)
Retic Count, Absolute: 65.1 10*3/uL (ref 19.0–186.0)

## 2018-05-15 LAB — MAGNESIUM: MAGNESIUM: 2.3 mg/dL (ref 1.7–2.4)

## 2018-05-15 LAB — FOLATE: Folate: 33.5 ng/mL (ref >5.9)

## 2018-05-15 LAB — HEMOGLOBIN A1C
HEMOGLOBIN A1C: 6.2 % — AB (ref 4.8–5.6)
MEAN PLASMA GLUCOSE: 131.24 mg/dL

## 2018-05-15 LAB — FERRITIN: Ferritin: 116 ng/mL (ref 24–336)

## 2018-05-15 LAB — MRSA PCR SCREENING: MRSA by PCR: NEGATIVE

## 2018-05-15 LAB — VITAMIN B12: VITAMIN B 12: 1240 pg/mL — AB (ref 180–914)

## 2018-05-15 MED ORDER — GUAIFENESIN ER 600 MG PO TB12
600.0000 mg | ORAL_TABLET | Freq: Two times a day (BID) | ORAL | Status: DC
  Administered 2018-05-15 – 2018-05-16 (×5): 600 mg via ORAL
  Filled 2018-05-15 (×5): qty 1

## 2018-05-15 MED ORDER — SODIUM CHLORIDE 0.9 % IV SOLN
100.0000 mg | Freq: Two times a day (BID) | INTRAVENOUS | Status: DC
  Administered 2018-05-15: 100 mg via INTRAVENOUS
  Filled 2018-05-15 (×2): qty 100

## 2018-05-15 MED ORDER — PREDNISONE 20 MG PO TABS
40.0000 mg | ORAL_TABLET | Freq: Every day | ORAL | Status: AC
  Administered 2018-05-16 – 2018-05-19 (×4): 40 mg via ORAL
  Filled 2018-05-15 (×4): qty 2

## 2018-05-15 MED ORDER — DOXYCYCLINE HYCLATE 100 MG PO TABS
100.0000 mg | ORAL_TABLET | Freq: Two times a day (BID) | ORAL | Status: AC
  Administered 2018-05-15 – 2018-05-19 (×9): 100 mg via ORAL
  Filled 2018-05-15 (×9): qty 1

## 2018-05-15 MED ORDER — ONDANSETRON HCL 4 MG/2ML IJ SOLN: 4.0000 mg | Freq: Four times a day (QID) | INTRAMUSCULAR | Status: DC | PRN

## 2018-05-15 MED ORDER — LEVOTHYROXINE SODIUM 200 MCG PO TABS
200.0000 ug | ORAL_TABLET | Freq: Every day | ORAL | Status: DC
  Administered 2018-05-15 – 2018-05-24 (×10): 200 ug via ORAL
  Filled 2018-05-15: qty 2
  Filled 2018-05-15 (×3): qty 1
  Filled 2018-05-15 (×6): qty 2
  Filled 2018-05-15: qty 1
  Filled 2018-05-15: qty 2
  Filled 2018-05-15 (×4): qty 1
  Filled 2018-05-15: qty 2
  Filled 2018-05-15: qty 1
  Filled 2018-05-15: qty 2
  Filled 2018-05-15: qty 1

## 2018-05-15 MED ORDER — INSULIN ASPART 100 UNIT/ML ~~LOC~~ SOLN: 0.0000 [IU] | Freq: Every day | SUBCUTANEOUS | Status: DC

## 2018-05-15 MED ORDER — ACETAMINOPHEN 325 MG PO TABS
650.0000 mg | ORAL_TABLET | Freq: Four times a day (QID) | ORAL | Status: DC | PRN
  Administered 2018-05-15 – 2018-05-23 (×5): 650 mg via ORAL
  Filled 2018-05-15 (×5): qty 2

## 2018-05-15 MED ORDER — ONDANSETRON HCL 4 MG PO TABS: 4.0000 mg | ORAL_TABLET | Freq: Four times a day (QID) | ORAL | Status: DC | PRN

## 2018-05-15 MED ORDER — IPRATROPIUM-ALBUTEROL 0.5-2.5 (3) MG/3ML IN SOLN
3.0000 mL | Freq: Four times a day (QID) | RESPIRATORY_TRACT | Status: DC
  Administered 2018-05-15 – 2018-05-18 (×13): 3 mL via RESPIRATORY_TRACT
  Filled 2018-05-15 (×14): qty 3

## 2018-05-15 MED ORDER — ALBUTEROL SULFATE (2.5 MG/3ML) 0.083% IN NEBU
2.5000 mg | INHALATION_SOLUTION | RESPIRATORY_TRACT | Status: DC | PRN
  Administered 2018-05-17: 2.5 mg via RESPIRATORY_TRACT
  Filled 2018-05-15: qty 3

## 2018-05-15 MED ORDER — SODIUM CHLORIDE 0.9 % IV SOLN: 250.0000 mL | INTRAVENOUS | Status: DC | PRN

## 2018-05-15 MED ORDER — SODIUM CHLORIDE 0.9% FLUSH
3.0000 mL | Freq: Two times a day (BID) | INTRAVENOUS | Status: DC
  Administered 2018-05-15: 3 mL via INTRAVENOUS
  Administered 2018-05-15: 10 mL via INTRAVENOUS
  Administered 2018-05-15 – 2018-05-17 (×4): 3 mL via INTRAVENOUS
  Administered 2018-05-17: 10 mL via INTRAVENOUS
  Administered 2018-05-18 – 2018-05-23 (×12): 3 mL via INTRAVENOUS

## 2018-05-15 MED ORDER — ALLOPURINOL 300 MG PO TABS
300.0000 mg | ORAL_TABLET | Freq: Every day | ORAL | Status: DC
  Administered 2018-05-15 – 2018-05-24 (×10): 300 mg via ORAL
  Filled 2018-05-15 (×10): qty 1

## 2018-05-15 MED ORDER — SODIUM CHLORIDE 0.9% FLUSH
3.0000 mL | INTRAVENOUS | Status: DC | PRN
  Administered 2018-05-22: 3 mL via INTRAVENOUS
  Filled 2018-05-15: qty 3

## 2018-05-15 MED ORDER — ACETAMINOPHEN 650 MG RE SUPP: 650.0000 mg | Freq: Four times a day (QID) | RECTAL | Status: DC | PRN

## 2018-05-15 MED ORDER — INSULIN ASPART 100 UNIT/ML ~~LOC~~ SOLN
0.0000 [IU] | Freq: Three times a day (TID) | SUBCUTANEOUS | Status: DC
  Administered 2018-05-15 (×3): 3 [IU] via SUBCUTANEOUS
  Administered 2018-05-16: 2 [IU] via SUBCUTANEOUS
  Administered 2018-05-16 – 2018-05-18 (×5): 3 [IU] via SUBCUTANEOUS
  Administered 2018-05-19 (×2): 2 [IU] via SUBCUTANEOUS
  Administered 2018-05-20: 3 [IU] via SUBCUTANEOUS
  Administered 2018-05-21 (×2): 2 [IU] via SUBCUTANEOUS
  Administered 2018-05-21: 3 [IU] via SUBCUTANEOUS
  Administered 2018-05-22: 2 [IU] via SUBCUTANEOUS

## 2018-05-15 MED ORDER — MOMETASONE FURO-FORMOTEROL FUM 200-5 MCG/ACT IN AERO
2.0000 | INHALATION_SPRAY | Freq: Two times a day (BID) | RESPIRATORY_TRACT | Status: DC
  Administered 2018-05-15 – 2018-05-24 (×18): 2 via RESPIRATORY_TRACT
  Filled 2018-05-15: qty 8.8

## 2018-05-15 MED ORDER — METHYLPREDNISOLONE SODIUM SUCC 125 MG IJ SOLR
60.0000 mg | Freq: Every day | INTRAMUSCULAR | Status: AC
  Administered 2018-05-15: 60 mg via INTRAVENOUS
  Filled 2018-05-15: qty 2

## 2018-05-15 MED ORDER — PRAVASTATIN SODIUM 40 MG PO TABS
40.0000 mg | ORAL_TABLET | Freq: Every day | ORAL | Status: DC
  Administered 2018-05-15 – 2018-05-23 (×10): 40 mg via ORAL
  Filled 2018-05-15 (×10): qty 1

## 2018-05-15 MED ORDER — FUROSEMIDE 10 MG/ML IJ SOLN
40.0000 mg | Freq: Two times a day (BID) | INTRAMUSCULAR | Status: DC
  Administered 2018-05-15 – 2018-05-16 (×3): 40 mg via INTRAVENOUS
  Filled 2018-05-15 (×3): qty 4

## 2018-05-15 MED ORDER — ASPIRIN EC 81 MG PO TBEC
81.0000 mg | DELAYED_RELEASE_TABLET | Freq: Every day | ORAL | Status: DC
  Administered 2018-05-15 – 2018-05-24 (×10): 81 mg via ORAL
  Filled 2018-05-15 (×10): qty 1

## 2018-05-15 NOTE — Progress Notes (Signed)
Nutrition Brief Note  Received MD Consult per COPD protocol. Patient with fluctuating weights, likely related to fluid shifts with CHF. Nutrition focused physical exam completed.  No muscle or subcutaneous fat depletion noticed. Patient reports good intake PTA and now.  Wt Readings from Last 5 Encounters:  05/15/18 271 lb 6.2 oz (123.1 kg)  02/02/18 249 lb 12.8 oz (113.3 kg)  01/26/18 260 lb (117.9 kg)  08/05/17 260 lb 11.2 oz (118.3 kg)  04/09/17 254 lb (115.2 kg)    Body mass index is 40.08 kg/m. Patient meets criteria for morbid obesity based on current BMI.   Current diet order is CHO modified, patient consumed 100% of breakfast this morning. Labs and medications reviewed.   No nutrition interventions warranted at this time. If nutrition issues arise, please consult RD.   Molli Barrows, RD, LDN, [place] Pager [phone] After Hours Pager [phone]

## 2018-05-15 NOTE — Care Management Note (Signed)
Case Management Note  Patient Details  Name: SALEM Malone MRN: [id number] Date of Birth: 1929-10-08  Subjective/Objective:                 Spoke w patient and daughter at bedside. Patient lives with daughter, has almost 24 hour supervision. Has all DME needed at home including electric WC and lift chair. No difficulties obtaining meds or seeing Mds. No CM needs identified.     Action/Plan:   Expected Discharge Date:                  Expected Discharge Plan:  Home/Self Care  In-House Referral:     Discharge planning Services  CM Consult  Post Acute Care Choice:    Choice offered to:     DME Arranged:    DME Agency:     HH Arranged:    HH Agency:     Status of Service:  In process, will continue to follow  If discussed at Long Length of Stay Meetings, dates discussed:    Additional Comments:  Carles Collet, RN 05/15/2018, 2:07 PM

## 2018-05-15 NOTE — ED Notes (Signed)
Pt placed on 6L simple mask after using urinal. Pt having exertional dyspnea from sitting up to use urinal at bedside.

## 2018-05-15 NOTE — Progress Notes (Signed)
PROGRESS NOTE    [name]  NWG:[id number] DOB: 1929-02-27 DOA: 05/14/2018 PCP: Unk Pinto, MD   Brief Narrative:  [name] is a 84 y.o. male with medical history significant of HTN, Chronic Venous InsufficiencyHLD, Prediabetes, Hypothyroidism, Hx Gout, Metastatic Renal Cell Carcinomas/p Nephrectomy, osteomyelitis of right foot and other comorbids who presented with progressive worsening shortness of breath and dyspnea on exertion has had occasional cough but no chest pain. Cough is nonproductive no associated fever has been falling asleep frequently and if any exertion he gets very short of breath he has been seen by his Cardiologist who felt that he was doing well he has chronic leg edema for which she takes Bumex but slightly been getting worse. Has not Been told in the past he has heart failure.  This morning he woke up with progressively worsening shortness of breath and they called 911 patient had crackles on exam per EMS and became very tachypneic with stridor with minimal exertion.  EMS initially satting 93% on room air improved to 98% on 6 L. Reports black stools but has been on iron. Last colonoscopy was over 10 years ago and he opted not to repeat. Done by [name].  He was found to have worsening anemia and Hemoccult positive from below. CXR worrisome for fluid overload and currently being diuresed.   Assessment & Plan:   Active Problems:   Cancer of kidney (HCC)   GERD (gastroesophageal reflux disease)   CKD, stage IV (GFR 28 ml/min) (HCC)   Hypothyroidism   Hyperlipidemia   CAD (coronary artery disease), native coronary artery   Essential hypertension   Anemia   Acute respiratory failure (HCC)   Occult blood in stools   Fluid overload   Reactive airway disease  Acute Respiratory Failure with Hypoxia likely from Volume Overload, Symptomatic Anemia, and Possible COPD Exacerbation -Multifactorial and likely a combination of fluid overload versus reactive  airway disease with known history of tobacco abuse in the past.   -Obtain ECHOCARDIOGRAM -Cycle Cardiac Enzymes and Flat Trend 0.03 x3  -Also treat for possible COPD Exacerbation and given Methylprednisolone 60 mg IV Daily and changed to Prednisone 40 mg po Daily x 4 more days  -C/w Mometasone-Formoterol 2 puff IH BID and Albuterol 2.5 mg Neb q1hprn SOB -C/w Furosemide 40 mg q12h -Added Guaifenesin 600 mg po BID Daily -Denies any chest pain has bilateral lower extremity swelling -History wise PE being less likely -Repeat CXR in AM  -O2 Requirement Weaning   Normocytic Anemia  -Obtained Anemia Panel iron level 25, TIBC 311, TIBC 336, saturation ratio 7%, ferritin level of 116, folate of 33.5, and vitamin B12 of 1240 -Family states he has history of hemorrhoids and has had black stools but on iron pills  -Hb/Hct went from 9.5/29.8 -> 9.3/29.3 -Has refused in the past for further GI work-up  -Continue to Monitor family agreeable to transfusions if needed -Repeat CBC in AM   CAD (coronary artery disease), native coronary artery -Hold Aspirin for now given occult positive and worsening anemia. -Continue Pravastatin 40 mg po Daily  Cancer of Kidney s/p Nephrectomy with Metastasis to the Lung -Stable  -Recent CT of the Chest showed no new metastases -PET CT of the chest done in March 2019 showed no evidence of recurrent disease  CKD, Stage IV  -Chronic  -BUN/Cr went from 50/2.23 -> 46/2.12 -Currently at baseline avoid nephrotoxic medications  COPD Exacerbation -C/w Mometasone-Formoterol 2 puff IH BID and Albuterol 2.5 mg Neb q1hprn SOB -  Added po Doxycycline 100 mg po q12h -Given Methylprednisolone 60 mg IV Daily and changed to Prednisone 40 mg po Daily x 4 more days  -Given Guaifenesin 600 mg po BID   Essential Hypertension -Hold Norvasc given worsening lower extremity swelling -Given Furosemide 40 mg q12h   GERD  -Stable  -Not on home Medications   Hyperlipidemia -C/w  Pravastatin 40 mg po daily qHS  Hypothyroidism -Checking TSH -C/w Levothyroxine 200 mcg  Occult blood in Stools  -Patient has refused GI work-up in the past has history of hemorrhoids -FOBT + -Continue to monitor CBC for now  Fluid overload no past history of CHF  -No ECHOCardiogram in the system.  -Will obtain ECHOCardiogram -Cycle Troponins and Initial Troponin was 0.02 and repeat was 0.03 x3 -BNP was 312.9 -C/w IV gentle diuresis and monitor fluid status  -Strict I's/O's and Daily Weights; Patient is -75 -If abnormal ECHOCARDIOGRAM will need cardiology consult  Reactive Airway Disease patient with history of Tobacco Abuse  -Currently with significant wheezing on admission.   -Will treat for possible COPD exacerbation contributed to acute respiratory failure as above   Somnolence -Checked VBG 7.373/42.5/66.0/24.7/92.0 for CO2 retention and treat as needed -Improving   Diabetes Mellitus Type 2 -Started on Moderate Novolog AC/HS -Checked TSH and was 2.350 and HgA1C was 6.2 -Not on Home Medications  Gout  -Continue home medications with Allopurinol 300 mg po Daily   DVT prophylaxis: SCDs Code Status: DO NOT RESUSCITATE  Family Communication: No family present at bedside  Disposition Plan: Pending further workup and evaluation by PT/OT  Consultants:   None  Procedures:  ECHOCARDIOGRAM   Antimicrobials:  Anti-infectives (From admission, onward)   Start     Dose/Rate Route Frequency Ordered Stop   05/15/18 2200  doxycycline (VIBRA-TABS) tablet 100 mg     100 mg Oral Every 12 hours 05/15/18 0918 05/20/18 0959   05/15/18 0200  doxycycline (VIBRAMYCIN) 100 mg in sodium chloride 0.9 % 250 mL IVPB  Status:  Discontinued     100 mg 125 mL/hr over 120 Minutes Intravenous Every 12 hours 05/15/18 0032 05/15/18 0918     Subjective: Seen and examined at bedside and felt about the same and states SOB did not really change. No Lightheadedness or dizziness. Denies any CP.  States Legs have been swollen for a long time.   Objective: Vitals:   05/15/18 0118 05/15/18 0622 05/15/18 0759 05/15/18 0835  BP: (!) 162/59 (!) 149/91 (!) 148/65   Pulse: 81 78 79 84  Resp: 17 19 18 20   Temp: 97.8 F (36.6 C) (!) 97.4 F (36.3 C) (!) 97.5 F (36.4 C)   TempSrc: Oral Oral Oral   SpO2: 94% 91% 93% 92%  Weight: 123.1 kg (271 lb 6.2 oz)     Height:        Intake/Output Summary (Last 24 hours) at 05/15/2018 1445 Last data filed at 05/15/2018 1323 Gross per 24 hour  Intake 691.69 ml  Output 820 ml  Net -128.31 ml   Filed Weights   05/14/18 2101 05/15/18 0118  Weight: 114.8 kg (253 lb) 123.1 kg (271 lb 6.2 oz)   Examination: Physical Exam:  Constitutional: WN/WD morbidly obese Caucasian male in NAD and appears calm  Eyes: Lids and conjunctivae normal, sclerae anicteric  ENMT: External Ears, Nose appear normal. Hard of hearing and has right hearing aid. Mucous membranes are moist. Neck: Appears normal, supple, no cervical masses, normal ROM, no appreciable thyromegaly; Slight JVD Respiratory: Diminished to auscultation  bilaterally with some wheezing and crackles, but no rales, rhonchi. Normal respiratory effort and patient is not tachypenic. No accessory muscle use but is wearing supplemental O2 via [place].   Cardiovascular: RRR, Slight Murmur. S1 and S2 auscultated. 2+ LE extremity edema.  Abdomen: Soft, non-tender, Distended 2/2 body house.  Bowel sounds positive x4.  GU: Deferred. Musculoskeletal: Has a Right foot Deformity from bone removal Skin: Chronic Venous Stasis changes with 2+ LE Swelling. No induration; Warm and dry.  Neurologic: CN 2-12 grossly intact with no focal deficits.  Romberg sign and cerebellar reflexes not assessed.  Psychiatric: Normal judgment and insight. Alert and oriented x 3. Normal mood and appropriate affect.   Data Reviewed: I have personally reviewed following labs and imaging studies  CBC: Recent Labs  Lab 05/14/18 2113  05/15/18 0542  WBC 10.0 9.0  HGB 9.5* 9.3*  HCT 29.8* 29.3*  MCV 98.7 99.0  PLT 241 989   Basic Metabolic Panel: Recent Labs  Lab 05/14/18 2113 05/15/18 0542  NA 137 140  K 5.1 4.4  CL 104 107  CO2 24 24  GLUCOSE 157* 154*  BUN 50* 46*  CREATININE 2.23* 2.12*  CALCIUM 8.6* 8.6*  MG 2.4 2.3  PHOS  --  4.3   GFR: Estimated Creatinine Clearance: 31.8 mL/min (A) (by C-G formula based on SCr of 2.12 mg/dL (H)). Liver Function Tests: Recent Labs  Lab 05/15/18 0542  AST 19  ALT 18  ALKPHOS 80  BILITOT 0.7  PROT 6.9  ALBUMIN 2.9*   No results for input(s): LIPASE, AMYLASE in the last 168 hours. No results for input(s): AMMONIA in the last 168 hours. Coagulation Profile: No results for input(s): INR, PROTIME in the last 168 hours. Cardiac Enzymes: Recent Labs  Lab 05/15/18 0029 05/15/18 0542 05/15/18 1144  TROPONINI 0.03* 0.03* 0.03*   BNP (last 3 results) No results for input(s): PROBNP in the last 8760 hours. HbA1C: Recent Labs    05/15/18 0542  HGBA1C 6.2*   CBG: Recent Labs  Lab 05/15/18 0154 05/15/18 0753 05/15/18 1155  GLUCAP 132* 160* 178*   Lipid Profile: No results for input(s): CHOL, HDL, LDLCALC, TRIG, CHOLHDL, LDLDIRECT in the last 72 hours. Thyroid Function Tests: Recent Labs    05/15/18 0542  TSH 2.350   Anemia Panel: Recent Labs    05/15/18 0542  VITAMINB12 1,240*  FOLATE 33.5  FERRITIN 116  TIBC 336  IRON 25*  RETICCTPCT 2.2   Sepsis Labs: No results for input(s): PROCALCITON, LATICACIDVEN in the last 168 hours.  Recent Results (from the past 240 hour(s))  MRSA PCR Screening     Status: None   Collection Time: 05/15/18  6:44 AM  Result Value Ref Range Status   MRSA by PCR NEGATIVE NEGATIVE Final    Comment:        The GeneXpert MRSA Assay (FDA approved for NASAL specimens only), is one component of a comprehensive MRSA colonization surveillance program. It is not intended to diagnose MRSA infection nor to guide  or monitor treatment for MRSA infections. Performed at [facility], [place] [address] [place], [place] [address]     Radiology Studies: Dg Chest Portable 1 View  Result Date: 05/14/2018 CLINICAL DATA:  Shortness of breath. EXAM: PORTABLE CHEST 1 VIEW COMPARISON:  CT chest 01/29/2018.  Chest x-ray 07/30/2015. FINDINGS: 2116 hours. Low lung volumes. Cardiopericardial silhouette is at upper limits of normal for size. There is pulmonary vascular congestion without overt pulmonary edema. Diffuse underlying interstitial  opacity is associated with atelectasis at the bases with possible small left pleural effusion. The visualized bony structures of the thorax are intact. Telemetry leads overlie the chest. IMPRESSION: 1. Upper normal heart size and vascular congestion. 2. Underlying chronic interstitial scars that underlying interstitial opacity may be chronic or related to interstitial edema. 3. Bibasilar atelectasis with possible tiny left pleural effusion. Electronically Signed   By: Misty Stanley M.D.   On: 05/14/2018 21:29   Scheduled Meds: . allopurinol  300 mg Oral Daily  . aspirin EC  81 mg Oral Daily  . doxycycline  100 mg Oral Q12H  . furosemide  40 mg Intravenous Q12H  . guaiFENesin  600 mg Oral BID  . insulin aspart  0-15 Units Subcutaneous TID WC  . insulin aspart  0-5 Units Subcutaneous QHS  . ipratropium-albuterol  3 mL Nebulization QID  . levothyroxine  200 mcg Oral QAC breakfast  . mometasone-formoterol  2 puff Inhalation BID  . pravastatin  40 mg Oral QHS  . [START ON 05/16/2018] predniSONE  40 mg Oral Q breakfast  . sodium chloride flush  3 mL Intravenous Q12H   Continuous Infusions: . sodium chloride      LOS: 0 days   Kerney Elbe, DO [facility] Pager [phone]  If 7PM-7AM, please contact night-coverage [email or web address] Password [email or web address] 05/15/2018, 2:45 PM

## 2018-05-16 ENCOUNTER — Inpatient Hospital Stay (HOSPITAL_COMMUNITY): Payer: Medicare Other

## 2018-05-16 DIAGNOSIS — R0600 Dyspnea, unspecified: Secondary | ICD-10-CM

## 2018-05-16 LAB — CBC WITH DIFFERENTIAL/PLATELET
Abs Immature Granulocytes: 0.1 10*3/uL (ref 0.0–0.1)
BASOS ABS: 0 10*3/uL (ref 0.0–0.1)
Basophils Relative: 0 %
EOS ABS: 0 10*3/uL (ref 0.0–0.7)
EOS PCT: 0 %
HCT: 25.1 % — ABNORMAL LOW (ref 39.0–52.0)
Hemoglobin: 8 g/dL — ABNORMAL LOW (ref 13.0–17.0)
Immature Granulocytes: 1 %
Lymphocytes Relative: 7 %
Lymphs Abs: 0.8 10*3/uL (ref 0.7–4.0)
MCH: 31.4 pg (ref 26.0–34.0)
MCHC: 31.9 g/dL (ref 30.0–36.0)
MCV: 98.4 fL (ref 78.0–100.0)
MONO ABS: 0.5 10*3/uL (ref 0.1–1.0)
MONOS PCT: 5 %
NEUTROS ABS: 9.4 10*3/uL — AB (ref 1.7–7.7)
Neutrophils Relative %: 87 %
Platelets: 215 10*3/uL (ref 150–400)
RBC: 2.55 MIL/uL — ABNORMAL LOW (ref 4.22–5.81)
RDW: 14.6 % (ref 11.5–15.5)
WBC: 10.8 10*3/uL — ABNORMAL HIGH (ref 4.0–10.5)

## 2018-05-16 LAB — COMPREHENSIVE METABOLIC PANEL
ALT: 15 U/L (ref 0–44)
AST: 21 U/L (ref 15–41)
Albumin: 2.7 g/dL — ABNORMAL LOW (ref 3.5–5.0)
Alkaline Phosphatase: 61 U/L (ref 38–126)
Anion gap: 11 (ref 5–15)
BILIRUBIN TOTAL: 0.7 mg/dL (ref 0.3–1.2)
BUN: 61 mg/dL — AB (ref 8–23)
CALCIUM: 8 mg/dL — AB (ref 8.9–10.3)
CHLORIDE: 108 mmol/L (ref 98–111)
CO2: 20 mmol/L — ABNORMAL LOW (ref 22–32)
CREATININE: 2.72 mg/dL — AB (ref 0.61–1.24)
GFR, EST AFRICAN AMERICAN: 23 mL/min — AB (ref >60)
GFR, EST NON AFRICAN AMERICAN: 20 mL/min — AB (ref >60)
Glucose, Bld: 131 mg/dL — ABNORMAL HIGH (ref 70–99)
Potassium: 4.9 mmol/L (ref 3.5–5.1)
Sodium: 139 mmol/L (ref 135–145)
TOTAL PROTEIN: 5.9 g/dL — AB (ref 6.5–8.1)

## 2018-05-16 LAB — ECHOCARDIOGRAM COMPLETE
Height: 69 in
Weight: 4335.13 oz

## 2018-05-16 LAB — GLUCOSE, CAPILLARY
GLUCOSE-CAPILLARY: 150 mg/dL — AB (ref 70–99)
Glucose-Capillary: 128 mg/dL — ABNORMAL HIGH (ref 70–99)
Glucose-Capillary: 156 mg/dL — ABNORMAL HIGH (ref 70–99)
Glucose-Capillary: 168 mg/dL — ABNORMAL HIGH (ref 70–99)

## 2018-05-16 LAB — PHOSPHORUS: Phosphorus: 5.2 mg/dL — ABNORMAL HIGH (ref 2.5–4.6)

## 2018-05-16 LAB — MAGNESIUM: MAGNESIUM: 2.4 mg/dL (ref 1.7–2.4)

## 2018-05-16 MED ORDER — PANTOPRAZOLE SODIUM 40 MG PO TBEC
40.0000 mg | DELAYED_RELEASE_TABLET | Freq: Two times a day (BID) | ORAL | Status: DC
  Administered 2018-05-16 – 2018-05-18 (×5): 40 mg via ORAL
  Filled 2018-05-16 (×5): qty 1

## 2018-05-16 NOTE — Progress Notes (Signed)
PROGRESS NOTE    [name]  YHC:[id number] DOB: November 18, 1928 DOA: 05/14/2018 PCP: Unk Pinto, MD   Brief Narrative:  [name] is a 84 y.o. male with medical history significant of HTN, Chronic Venous InsufficiencyHLD, Prediabetes, Hypothyroidism, Hx Gout, Metastatic Renal Cell Carcinomas/p Nephrectomy, osteomyelitis of right foot and other comorbids who presented with progressive worsening shortness of breath and dyspnea on exertion has had occasional cough but no chest pain. Cough is nonproductive no associated fever has been falling asleep frequently and if any exertion he gets very short of breath he has been seen by his Cardiologist who felt that he was doing well he has chronic leg edema for which she takes Bumex but slightly been getting worse. Has not Been told in the past he has heart failure.  This morning he woke up with progressively worsening shortness of breath and they called 911 patient had crackles on exam per EMS and became very tachypneic with stridor with minimal exertion.  EMS initially satting 93% on room air improved to 98% on 6 L. Reports black stools but has been on iron. Last colonoscopy was over 10 years ago and he opted not to repeat. Done by [name].  He was found to have worsening anemia and Hemoccult positive from below. CXR worrisome for fluid overload and currently being diuresed. Diuresis now stopped due to AKI on CKD.   Assessment & Plan:   Active Problems:   Cancer of kidney (HCC)   GERD (gastroesophageal reflux disease)   CKD, stage IV (GFR 28 ml/min) (HCC)   Hypothyroidism   Hyperlipidemia   CAD (coronary artery disease), native coronary artery   Essential hypertension   Anemia   Acute respiratory failure (HCC)   Occult blood in stools   Fluid overload   Reactive airway disease  Acute Respiratory Failure with Hypoxia likely from Volume Overload, Symptomatic Anemia, and Possible COPD Exacerbation -Multifactorial and likely a  combination of fluid overload versus reactive airway disease with known history of tobacco abuse in the past.   -Obtained ECHOCARDIOGRAM and limited study but normal Bilateral Ventricular Systolic Function and Normal LV Diastolid Dysfunction normal -Cycle Cardiac Enzymes and Flat Trend 0.03 x3  -Also treat for possible COPD Exacerbation and given Methylprednisolone 60 mg IV Daily and changed to Prednisone 40 mg po Daily x 4 days subsequent (Day 1 of Prednisone)  -C/w Mometasone-Formoterol 2 puff IH BID and Albuterol 2.5 mg Neb q1hprn SOB -Continued Furosemide 40 mg q12h but now Discontinued given worsen Renal Function  -Added Guaifenesin 600 mg po BID Daily -Denies any chest pain has bilateral lower extremity swelling -History wise PE being less likely -Repeat CXR this AM showed Cardiomegaly with small left pleural effusion. Lower lung zone atelectasis with slight interstitial pulmonary edema. No frank consolidation evident. There is aortic atherosclerosis. Aortic Atherosclerosis. -O2 Requirement Weaning as tolerated    Normocytic Anemia  -Obtained Anemia Panel iron level 25, TIBC 311, TIBC 336, saturation ratio 7%, ferritin level of 116, folate of 33.5, and vitamin B12 of 1240 -Family states he has history of hemorrhoids and has had black stools but on iron pills  -Hb/Hct went from 9.5/29.8 -> 9.3/29.3 -> 8.0/25.1 -Has refused in the past for further GI work-up  -Continue to Monitor family agreeable to transfusions if needed -Transfuse <7.0 -Repeat CBC in AM   CAD (coronary artery disease), native coronary artery -Hold Aspirin for now given occult positive and worsening anemia. -Continue Pravastatin 40 mg po Daily  Cancer of Kidney s/p  Nephrectomy with Metastasis to the Lung -Stable  -Recent CT of the Chest showed no new metastases -PET CT of the chest done in March 2019 showed no evidence of recurrent disease  AKI on CKD, Stage IV  -BUN/Cr went from 50/2.23 -> 46/2.12 ->  61/2.72 -Likely worsened from Diuresis; Will stop IV Diuresis  -Currently at baseline avoid nephrotoxic medications  COPD Exacerbation -C/w Mometasone-Formoterol 2 puff IH BID and Albuterol 2.5 mg Neb q1hprn SOB -Added po Doxycycline 100 mg po q12h -Given Methylprednisolone 60 mg IV Daily and changed to Prednisone 40 mg po Daily x 4 more days (Day 1 of Prednisone)  -WBC elevated slightly likely to IV Steroid Demargination  -Given Guaifenesin 600 mg po BID   Essential Hypertension -Hold Norvasc given worsening lower extremity swelling -Given Furosemide 40 mg q12h but will stop now   GERD  -Stable  -Not on home Medications but will start Protonix BID given Steroids  Hyperlipidemia -C/w Pravastatin 40 mg po daily qHS  Hypothyroidism -Checking TSH and was 2.350 -C/w Levothyroxine 200 mcg  Occult blood in Stools  -Patient has refused GI work-up in the past has history of hemorrhoids -FOBT + -Continue to monitor CBC for now as it is dropping slowly   Fluid overload no past history of CHF  -No ECHOCardiogram in the system. -Will obtain ECHOCardiogram; Showed no evidence of CHF -Cycle Troponins and Initial Troponin was 0.02 and repeat was 0.03 x3 -BNP was 312.9 -Continued IV gentle diuresis and monitor fluid status; and Now stopped -Strict I's/O's and Daily Weights; Patient is -468 mL -If abnormal ECHOCARDIOGRAM will need cardiology consult  Reactive Airway Disease patient with history of Tobacco Abuse  -Currently with significant wheezing on admission.   -Will treat for possible COPD exacerbation contributed to acute respiratory failure as above   Somnolence -Checked VBG 7.373/42.5/66.0/24.7/92.0 for CO2 retention and treat as needed -Improving   Diabetes Mellitus Type 2 -Started on Moderate Novolog AC/HS -Checked TSH and was 2.350 and HgA1C was 6.2 -Not on Home Medications -CBG ranging from 128 -168  Gout  -Continue home medications with Allopurinol 300 mg po Daily    Hyperphosphatemia -Patient's phosphorus level this morning is 5.2 -Continue to monitor -Repeat phosphorus level in a.m.  DVT prophylaxis: SCDs Code Status: DO NOT RESUSCITATE  Family Communication: No family present at bedside  Disposition Plan: SNF at D/C if agreeable  Consultants:   None  Procedures:  ECHOCARDIOGRAM ------------------------------------------------------------------- Study Conclusions  - Left ventricle: The cavity size was normal. Systolic function was   normal. The estimated ejection fraction was in the range of 60%   to 65%. Wall motion was normal; there were no regional wall   motion abnormalities. Left ventricular diastolic function   parameters were normal.  Impressions:  - Very limited study quality, however there appears to be normal   biventricular systolic function and no significant valvular   abnormalities.   Antimicrobials:  Anti-infectives (From admission, onward)   Start     Dose/Rate Route Frequency Ordered Stop   05/15/18 2200  doxycycline (VIBRA-TABS) tablet 100 mg     100 mg Oral Every 12 hours 05/15/18 0918 05/20/18 0959   05/15/18 0200  doxycycline (VIBRAMYCIN) 100 mg in sodium chloride 0.9 % 250 mL IVPB  Status:  Discontinued     100 mg 125 mL/hr over 120 Minutes Intravenous Every 12 hours 05/15/18 0032 05/15/18 0918     Subjective: Seen and examined at bedside and severely hard of hearing so is difficult to  communicate with him today.  He was just woken up from sleep and stated he felt okay.  Shortness of breath felt the same.  No other complaints or concerns at this time.  Still has some lower extremity leg edema so we will call nutrition as albumin is low likely contributing.  Objective: Vitals:   05/16/18 0910 05/16/18 1152 05/16/18 1604 05/16/18 1647  BP:    135/65  Pulse:    89  Resp:    (!) 22  Temp:    97.6 F (36.4 C)  TempSrc:    Oral  SpO2: 97% 96% 97% 96%  Weight:      Height:        Intake/Output  Summary (Last 24 hours) at 05/16/2018 1807 Last data filed at 05/16/2018 1619 Gross per 24 hour  Intake 910 ml  Output 1250 ml  Net -340 ml   Filed Weights   05/14/18 2101 05/15/18 0118 05/15/18 2053  Weight: 114.8 kg (253 lb) 123.1 kg (271 lb 6.2 oz) 122.9 kg (270 lb 15.1 oz)   Examination: Physical Exam:  Constitutional: Well-nourished, well-developed morbidly obese Caucasian male currently no acute distress appears calm and just woken up from sleep Eyes: Sclera are anicteric.  Lids and conjunctive are normal ENMT: External ears and nose appear normal.  Extremely hard of hearing and hearing aid is dead.  Mucous members are moist Neck: Supple with no JVD Respiratory: Diminished to auscultation bilaterally with mild  expiratory wheezing.  Crackles have improved.  No appreciable rales or rhonchi.  Normal respiratory effort and is not using any accessory muscles to breathe but is wearing supplemental oxygen via nasal cannula Cardiovascular: Regular rate and rhythm. Slight murmur and has 2+ lower extremity edema  Abdomen: Soft, nontender, distended secondary body habitus.  Bowel sounds present in 4 quadrants GU: Deferred Musculoskeletal: Has a right foot deformity from bone removal.  No contractures or cyanosis. Skin: Skin is warm and dry with chronic venous stasis changes with 1-2+ lower extremity swelling. Neurologic: Cranial nerves II through XII grossly intact no appreciable focal deficits except that he is extremely hard of hearing Psychiatric: Somnolent but arousable.  Normal mood and affect  Data Reviewed: I have personally reviewed following labs and imaging studies  CBC: Recent Labs  Lab 05/14/18 2113 05/15/18 0542 05/16/18 0707  WBC 10.0 9.0 10.8*  NEUTROABS  --   --  9.4*  HGB 9.5* 9.3* 8.0*  HCT 29.8* 29.3* 25.1*  MCV 98.7 99.0 98.4  PLT 241 251 503   Basic Metabolic Panel: Recent Labs  Lab 05/14/18 2113 05/15/18 0542 05/16/18 0517  NA 137 140 139  K 5.1 4.4 4.9    CL 104 107 108  CO2 24 24 20*  GLUCOSE 157* 154* 131*  BUN 50* 46* 61*  CREATININE 2.23* 2.12* 2.72*  CALCIUM 8.6* 8.6* 8.0*  MG 2.4 2.3 2.4  PHOS  --  4.3 5.2*   GFR: Estimated Creatinine Clearance: 24.8 mL/min (A) (by C-G formula based on SCr of 2.72 mg/dL (H)). Liver Function Tests: Recent Labs  Lab 05/15/18 0542 05/16/18 0517  AST 19 21  ALT 18 15  ALKPHOS 80 61  BILITOT 0.7 0.7  PROT 6.9 5.9*  ALBUMIN 2.9* 2.7*   No results for input(s): LIPASE, AMYLASE in the last 168 hours. No results for input(s): AMMONIA in the last 168 hours. Coagulation Profile: No results for input(s): INR, PROTIME in the last 168 hours. Cardiac Enzymes: Recent Labs  Lab 05/15/18 0029 05/15/18 0542  05/15/18 1144  TROPONINI 0.03* 0.03* 0.03*   BNP (last 3 results) No results for input(s): PROBNP in the last 8760 hours. HbA1C: Recent Labs    05/15/18 0542  HGBA1C 6.2*   CBG: Recent Labs  Lab 05/15/18 1638 05/15/18 2055 05/16/18 0758 05/16/18 1203 05/16/18 1642  GLUCAP 171* 171* 128* 156* 168*   Lipid Profile: No results for input(s): CHOL, HDL, LDLCALC, TRIG, CHOLHDL, LDLDIRECT in the last 72 hours. Thyroid Function Tests: Recent Labs    05/15/18 0542  TSH 2.350   Anemia Panel: Recent Labs    05/15/18 0542  VITAMINB12 1,240*  FOLATE 33.5  FERRITIN 116  TIBC 336  IRON 25*  RETICCTPCT 2.2   Sepsis Labs: No results for input(s): PROCALCITON, LATICACIDVEN in the last 168 hours.  Recent Results (from the past 240 hour(s))  MRSA PCR Screening     Status: None   Collection Time: 05/15/18  6:44 AM  Result Value Ref Range Status   MRSA by PCR NEGATIVE NEGATIVE Final    Comment:        The GeneXpert MRSA Assay (FDA approved for NASAL specimens only), is one component of a comprehensive MRSA colonization surveillance program. It is not intended to diagnose MRSA infection nor to guide or monitor treatment for MRSA infections. Performed at [facility] [address] [place], [place] [address]     Radiology Studies: Dg Chest Port 1 View  Result Date: 05/16/2018 CLINICAL DATA:  Shortness of breath EXAM: PORTABLE CHEST 1 VIEW COMPARISON:  May 14, 2018 FINDINGS: There is atelectatic change in the mid lower lung zones with small left pleural effusion. There is a slight degree of interstitial edema. Heart is enlarged with pulmonary vascularity normal. No adenopathy. There is aortic atherosclerosis. There is degenerative change in each shoulder. Postoperative changes noted in the lower cervical spine. Incidental note is made of an azygos lobe on the right, an anatomic variant. IMPRESSION: Cardiomegaly with small left pleural effusion. Lower lung zone atelectasis with slight interstitial pulmonary edema. No frank consolidation evident. There is aortic atherosclerosis. Aortic Atherosclerosis (ICD10-I70.0). Electronically Signed   By: Lowella Grip III M.D.   On: 05/16/2018 07:30   Dg Chest Portable 1 View  Result Date: 05/14/2018 CLINICAL DATA:  Shortness of breath. EXAM: PORTABLE CHEST 1 VIEW COMPARISON:  CT chest 01/29/2018.  Chest x-ray 07/30/2015. FINDINGS: 2116 hours. Low lung volumes. Cardiopericardial silhouette is at upper limits of normal for size. There is pulmonary vascular congestion without overt pulmonary edema. Diffuse underlying interstitial opacity is associated with atelectasis at the bases with possible small left pleural effusion. The visualized bony structures of the thorax are intact. Telemetry leads overlie the chest. IMPRESSION: 1. Upper normal heart size and vascular congestion. 2. Underlying chronic interstitial scars that underlying interstitial opacity may be chronic or related to interstitial edema. 3. Bibasilar atelectasis with possible tiny left pleural effusion. Electronically Signed   By: Misty Stanley M.D.   On: 05/14/2018 21:29   Scheduled Meds: . allopurinol  300 mg Oral Daily  . aspirin EC  81 mg Oral Daily  .  doxycycline  100 mg Oral Q12H  . guaiFENesin  600 mg Oral BID  . insulin aspart  0-15 Units Subcutaneous TID WC  . insulin aspart  0-5 Units Subcutaneous QHS  . ipratropium-albuterol  3 mL Nebulization QID  . levothyroxine  200 mcg Oral QAC breakfast  . mometasone-formoterol  2 puff Inhalation BID  . pravastatin  40 mg Oral QHS  .  predniSONE  40 mg Oral Q breakfast  . sodium chloride flush  3 mL Intravenous Q12H   Continuous Infusions: . sodium chloride      LOS: 1 day   Kerney Elbe, DO [facility] Pager [phone]  If 7PM-7AM, please contact night-coverage [email or web address] Password [facility] 05/16/2018, 6:07 PM

## 2018-05-16 NOTE — Evaluation (Signed)
Physical Therapy Evaluation Patient Details Name: [name] MRN: [id number] DOB: 1929-10-31 Today's Date: 05/16/2018   History of Present Illness  Pt is an 84 y.o. male with PMH significant for HTN, chronic venous insufficiency, HLD, pre-diabetes, hypothyroidism, history of gout, metastatic renal cell carcinoma s/p nephrectomy, osteomyelitis of R foot, and other comorbidities. He presented to the ED wtih progressive worsening of shortness of breath and dyspnea on exertion. Admitted with acute respiratory failure with hypoxia.   Clinical Impression  Pt admitted with above diagnosis. Pt currently with functional limitations due to the deficits listed below (see PT Problem List). At baseline, patient states he was able to transfer into power scooter independently and ambulate with a cane into the bathroom. Currently requiring two person moderate assistance for transfer from bed to chair using walker and very unsafe technique (propping elbows forward on walker). Overall, displaying very poor safety awareness, balance, and generalized weakness. Desaturation to 88% SpO2 on 2L O2 with noticeable wheezing; increased to 3L O2 with rebound to 93% SpO2. Based on patient presenting as a high fall risk, recommending SNF to maximize functional mobility.  Pt will benefit from skilled PT to increase their independence and safety with mobility to allow discharge to the venue listed below.       Follow Up Recommendations SNF;Supervision/Assistance - 24 hour    Equipment Recommendations  None recommended by PT    Recommendations for Other Services       Precautions / Restrictions Precautions Precautions: Fall Precaution Comments: Watch O2  Restrictions Weight Bearing Restrictions: No      Mobility  Bed Mobility Overal bed mobility: Needs Assistance Bed Mobility: Supine to Sit     Supine to sit: Mod assist;+2 for physical assistance     General bed mobility comments: Assist to power trunk up  from [facility] and to scoot hips toward EOB.   Transfers Overall transfer level: Needs assistance Equipment used: Rolling walker (2 wheeled) Transfers: Stand Pivot Transfers;Sit to/from Stand Sit to Stand: Mod assist;+2 physical assistance Stand pivot transfers: Min assist;+2 physical assistance       General transfer comment: Pt requiring significant assist to power up to standing. He is unable to maintain upright positioning on RW and leaning on elbows and placing hands on front bar of RW.   Ambulation/Gait                Stairs            Wheelchair Mobility    Modified Rankin (Stroke Patients Only)       Balance Overall balance assessment: Needs assistance Sitting-balance support: No upper extremity supported;Feet supported Sitting balance-Leahy Scale: Fair     Standing balance support: Bilateral upper extremity supported;No upper extremity supported;During functional activity Standing balance-Leahy Scale: Poor Standing balance comment: Relies on UE support and external assistance.                              Pertinent Vitals/Pain Pain Assessment: No/denies pain    Home Living Family/patient expects to be discharged to:: Private residence Living Arrangements: Children(daughter) Available Help at Discharge: Family;Available 24 hours/day Type of Home: House       Home Layout: One level Home Equipment: Financial trader - single point      Prior Function Level of Independence: Needs assistance   Gait / Transfers Assistance Needed: Uses power scooter for majority of mobility. Reports ambulating to bathroom with cane.   ADL's /  Homemaking Assistance Needed: Assistance for bathing from aide (2x/week). Pt reports dressing himself.         Hand Dominance        Extremity/Trunk Assessment   Upper Extremity Assessment Upper Extremity Assessment: Generalized weakness    Lower Extremity Assessment Lower Extremity Assessment:  Generalized weakness       Communication   Communication: HOH  Cognition Arousal/Alertness: Awake/alert Behavior During Therapy: WFL for tasks assessed/performed Overall Cognitive Status: Within Functional Limits for tasks assessed                                        General Comments General comments (skin integrity, edema, etc.): Increased supplemental O2 to 3L for transfer as pt with desaturation to 88% on 2L O2 during bed mobility. SpO2 93% after transfer.     Exercises     Assessment/Plan    PT Assessment Patient needs continued PT services  PT Problem List Decreased strength;Decreased balance;Decreased activity tolerance;Decreased mobility;Decreased knowledge of use of DME       PT Treatment Interventions DME instruction;Gait training;Functional mobility training;Therapeutic activities;Therapeutic exercise;Balance training;Patient/family education    PT Goals (Current goals can be found in the Care Plan section)  Acute Rehab PT Goals Patient Stated Goal: to feel better PT Goal Formulation: With patient Time For Goal Achievement: 05/30/18 Potential to Achieve Goals: Fair    Frequency Min 2X/week   Barriers to discharge        Co-evaluation PT/OT/SLP Co-Evaluation/Treatment: Yes Reason for Co-Treatment: Complexity of the patient's impairments (multi-system involvement);For patient/therapist safety;Necessary to address cognition/behavior during functional activity;To address functional/ADL transfers PT goals addressed during session: Mobility/safety with mobility OT goals addressed during session: ADL's and self-care;Strengthening/ROM       AM-PAC PT "6 Clicks" Daily Activity  Outcome Measure Difficulty turning over in bed (including adjusting bedclothes, sheets and blankets)?: A Lot Difficulty moving from lying on back to sitting on the side of the bed? : Unable Difficulty sitting down on and standing up from a chair with arms (e.g.,  wheelchair, bedside commode, etc,.)?: Unable Help needed moving to and from a bed to chair (including a wheelchair)?: A Little Help needed walking in hospital room?: A Lot Help needed climbing 3-5 steps with a railing? : Total 6 Click Score: 10    End of Session Equipment Utilized During Treatment: Gait belt;Oxygen Activity Tolerance: Patient tolerated treatment well Patient left: in chair;with call bell/phone within reach;with chair alarm set Nurse Communication: Mobility status PT Visit Diagnosis: Unsteadiness on feet (R26.81);Muscle weakness (generalized) (M62.81);Difficulty in walking, not elsewhere classified (R26.2)    Time: 1620-1650 PT Time Calculation (min) (ACUTE ONLY): 30 min   Charges:   PT Evaluation $PT Eval Moderate Complexity: 1 Mod     PT G Codes:        Ellamae Sia, PT, DPT Acute Rehabilitation Services  Pager: [place] 05/16/2018, 4:52 PM

## 2018-05-16 NOTE — Evaluation (Signed)
Occupational Therapy Evaluation Patient Details Name: [name] MRN: [id number] DOB: 1928-12-30 Today's Date: 05/16/2018    History of Present Illness Pt is an 84 y.o. male with PMH significant for HTN, chronic venous insufficiency, HLD, pre-diabetes, hypothyroidism, history of gout, metastatic renal cell carcinoma s/p nephrectomy, osteomyelitis of R foot, and other comorbidities. He presented to the ED wtih progressive worsening of shortness of breath and dyspnea on exertion. Admitted with acute respiratory failure with hypoxia.    Clinical Impression   PTA, pt was living with his daughter, able to transfer to his power scooter independently, and ambulate from power scooter into bathroom with cane per his report. He has assistance for bathing from aide 2 days per week but is otherwise independent with basic ADL. He currently presents with generalized weakness and is severely limited in activity tolerance for ADL participation. He requires total assist for LB ADL, mod assist +2 for simulated toilet transfers, and min assist for UB ADL. Pt would benefit from continued OT services while admitted to improve independene and safety with ADL participation. Recommend SNF level rehabilitation post-acute D/C.     Follow Up Recommendations  SNF;Supervision/Assistance - 24 hour    Equipment Recommendations  3 in 1 bedside commode    Recommendations for Other Services       Precautions / Restrictions Precautions Precautions: Fall Precaution Comments: Watch O2  Restrictions Weight Bearing Restrictions: No      Mobility Bed Mobility Overal bed mobility: Needs Assistance Bed Mobility: Supine to Sit     Supine to sit: Mod assist;+2 for physical assistance     General bed mobility comments: Assist to power trunk up from [facility] and to scoot hips toward EOB.   Transfers Overall transfer level: Needs assistance Equipment used: Rolling walker (2 wheeled) Transfers: Stand Pivot Transfers;Sit  to/from Stand Sit to Stand: Mod assist;+2 physical assistance Stand pivot transfers: Min assist;+2 physical assistance       General transfer comment: Pt requiring significant assist to power up to standing. He is unable to maintain upright positioning on RW and leaning on elbows and placing hands on front bar of RW.     Balance Overall balance assessment: Needs assistance Sitting-balance support: No upper extremity supported;Feet supported Sitting balance-Leahy Scale: Fair     Standing balance support: Bilateral upper extremity supported;No upper extremity supported;During functional activity Standing balance-Leahy Scale: Poor Standing balance comment: Relies on UE support and external assistance.                            ADL either performed or assessed with clinical judgement   ADL Overall ADL's : Needs assistance/impaired Eating/Feeding: Set up;Sitting   Grooming: Set up;Sitting   Upper Body Bathing: Sitting;Minimal assistance   Lower Body Bathing: Sit to/from stand;Total assistance   Upper Body Dressing : Sitting;Minimal assistance   Lower Body Dressing: Total assistance;Sit to/from stand   Toilet Transfer: Moderate assistance;Stand-pivot;RW;+2 for safety/equipment Toilet Transfer Details (indicate cue type and reason): Simulated from elevated bed to recliner Toileting- Clothing Manipulation and Hygiene: Sit to/from stand;Total assistance       Functional mobility during ADLs: Moderate assistance;Rolling walker;+2 for physical assistance(stand-pivot) General ADL Comments: Pt with very poor activity tolerance for ADL requiring rest breaks during short bursts of activity.      Vision Patient Visual Report: No change from baseline Vision Assessment?: No apparent visual deficits     Perception     Praxis  Pertinent Vitals/Pain Pain Assessment: No/denies pain     Hand Dominance     Extremity/Trunk Assessment Upper Extremity Assessment Upper  Extremity Assessment: Generalized weakness   Lower Extremity Assessment Lower Extremity Assessment: Generalized weakness       Communication Communication Communication: HOH   Cognition Arousal/Alertness: Awake/alert Behavior During Therapy: WFL for tasks assessed/performed Overall Cognitive Status: Within Functional Limits for tasks assessed                                     General Comments  Increased supplemental O2 to 3L for transfer as pt with desaturation to 88% on 2L O2 during bed mobility. SpO2 93% after transfer.     Exercises     Shoulder Instructions      Home Living Family/patient expects to be discharged to:: Private residence Living Arrangements: Children(daughter) Available Help at Discharge: Family;Available 24 hours/day Type of Home: House       Home Layout: One level     Bathroom Shower/Tub: Other (comment)(does wash-ups)   Bathroom Toilet: Standard     Home Equipment: Financial trader - single point          Prior Functioning/Environment Level of Independence: Needs assistance  Gait / Transfers Assistance Needed: Uses power scooter for majority of mobility. Reports ambulating to bathroom with cane.  ADL's / Homemaking Assistance Needed: Assistance for bathing from aide (2x/week). Pt reports dressing himself.             OT Problem List: Decreased strength;Decreased range of motion;Decreased activity tolerance;Impaired balance (sitting and/or standing);Decreased safety awareness;Decreased knowledge of use of DME or AE;Decreased knowledge of precautions;Cardiopulmonary status limiting activity      OT Treatment/Interventions: Self-care/ADL training;Therapeutic exercise;Energy conservation;DME and/or AE instruction;Therapeutic activities;Patient/family education;Balance training    OT Goals(Current goals can be found in the care plan section) Acute Rehab OT Goals Patient Stated Goal: to feel better OT Goal Formulation:  With patient Time For Goal Achievement: 05/30/18 Potential to Achieve Goals: Good ADL Goals Pt Will Perform Upper Body Dressing: sitting;with supervision Pt Will Perform Lower Body Dressing: with min assist;sit to/from stand Pt Will Transfer to Toilet: with min assist;ambulating Pt Will Perform Toileting - Clothing Manipulation and hygiene: with min assist;sit to/from stand  OT Frequency: Min 2X/week   Barriers to D/C:            Co-evaluation PT/OT/SLP Co-Evaluation/Treatment: Yes Reason for Co-Treatment: Complexity of the patient's impairments (multi-system involvement);For patient/therapist safety;To address functional/ADL transfers   OT goals addressed during session: ADL's and self-care;Strengthening/ROM      AM-PAC PT "6 Clicks" Daily Activity     Outcome Measure Help from another person eating meals?: None Help from another person taking care of personal grooming?: None Help from another person toileting, which includes using toliet, bedpan, or urinal?: A Lot Help from another person bathing (including washing, rinsing, drying)?: A Lot Help from another person to put on and taking off regular upper body clothing?: A Little Help from another person to put on and taking off regular lower body clothing?: Total 6 Click Score: 16   End of Session Equipment Utilized During Treatment: Gait belt;Rolling walker Nurse Communication: Mobility status  Activity Tolerance: Patient tolerated treatment well Patient left: in chair;with call bell/phone within reach;with chair alarm set  OT Visit Diagnosis: Other abnormalities of gait and mobility (R26.89);Muscle weakness (generalized) (M62.81)  Time: 1520-1550 OT Time Calculation (min): 30 min Charges:  OT General Charges $OT Visit: 1 Visit OT Evaluation $OT Eval Moderate Complexity: 1 Mod G-Codes:     Norman Herrlich, MS OTR/L  Pager: [place] A Aayush Gelpi 05/16/2018, 4:27 PM

## 2018-05-16 NOTE — Progress Notes (Signed)
  Echocardiogram 2D Echocardiogram has been performed.  [name] 05/16/2018, 3:25 PM

## 2018-05-17 ENCOUNTER — Inpatient Hospital Stay (HOSPITAL_COMMUNITY): Payer: Medicare Other

## 2018-05-17 ENCOUNTER — Ambulatory Visit: Payer: Self-pay | Admitting: Adult Health

## 2018-05-17 LAB — CBC WITH DIFFERENTIAL/PLATELET
ABS IMMATURE GRANULOCYTES: 0.1 10*3/uL (ref 0.0–0.1)
Basophils Absolute: 0 10*3/uL (ref 0.0–0.1)
Basophils Relative: 0 %
EOS PCT: 0 %
Eosinophils Absolute: 0 10*3/uL (ref 0.0–0.7)
HEMATOCRIT: 26.4 % — AB (ref 39.0–52.0)
HEMOGLOBIN: 8.3 g/dL — AB (ref 13.0–17.0)
IMMATURE GRANULOCYTES: 1 %
LYMPHS ABS: 1.5 10*3/uL (ref 0.7–4.0)
Lymphocytes Relative: 12 %
MCH: 31 pg (ref 26.0–34.0)
MCHC: 31.4 g/dL (ref 30.0–36.0)
MCV: 98.5 fL (ref 78.0–100.0)
Monocytes Absolute: 1 10*3/uL (ref 0.1–1.0)
Monocytes Relative: 8 %
NEUTROS ABS: 10 10*3/uL — AB (ref 1.7–7.7)
Neutrophils Relative %: 79 %
Platelets: 215 10*3/uL (ref 150–400)
RBC: 2.68 MIL/uL — AB (ref 4.22–5.81)
RDW: 14.7 % (ref 11.5–15.5)
WBC: 12.6 10*3/uL — AB (ref 4.0–10.5)

## 2018-05-17 LAB — PHOSPHORUS: Phosphorus: 5 mg/dL — ABNORMAL HIGH (ref 2.5–4.6)

## 2018-05-17 LAB — GLUCOSE, CAPILLARY
GLUCOSE-CAPILLARY: 108 mg/dL — AB (ref 70–99)
GLUCOSE-CAPILLARY: 157 mg/dL — AB (ref 70–99)
GLUCOSE-CAPILLARY: 145 mg/dL — AB (ref 70–99)
Glucose-Capillary: 167 mg/dL — ABNORMAL HIGH (ref 70–99)

## 2018-05-17 LAB — COMPREHENSIVE METABOLIC PANEL
ALBUMIN: 2.9 g/dL — AB (ref 3.5–5.0)
ALK PHOS: 61 U/L (ref 38–126)
ALT: 18 U/L (ref 0–44)
AST: 19 U/L (ref 15–41)
Anion gap: 8 (ref 5–15)
BILIRUBIN TOTAL: 0.5 mg/dL (ref 0.3–1.2)
BUN: 71 mg/dL — ABNORMAL HIGH (ref 8–23)
CALCIUM: 8.2 mg/dL — AB (ref 8.9–10.3)
CO2: 25 mmol/L (ref 22–32)
CREATININE: 2.82 mg/dL — AB (ref 0.61–1.24)
Chloride: 108 mmol/L (ref 98–111)
GFR calc Af Amer: 22 mL/min — ABNORMAL LOW (ref >60)
GFR calc non Af Amer: 19 mL/min — ABNORMAL LOW (ref >60)
GLUCOSE: 118 mg/dL — AB (ref 70–99)
Potassium: 3.7 mmol/L (ref 3.5–5.1)
SODIUM: 141 mmol/L (ref 135–145)
TOTAL PROTEIN: 6.2 g/dL — AB (ref 6.5–8.1)

## 2018-05-17 LAB — MAGNESIUM: Magnesium: 2.6 mg/dL — ABNORMAL HIGH (ref 1.7–2.4)

## 2018-05-17 MED ORDER — GUAIFENESIN ER 600 MG PO TB12
1200.0000 mg | ORAL_TABLET | Freq: Two times a day (BID) | ORAL | Status: DC
  Administered 2018-05-17 – 2018-05-24 (×16): 1200 mg via ORAL
  Filled 2018-05-17 (×15): qty 2

## 2018-05-17 MED ORDER — FUROSEMIDE 10 MG/ML IJ SOLN
20.0000 mg | Freq: Once | INTRAMUSCULAR | Status: AC
  Administered 2018-05-17: 20 mg via INTRAVENOUS

## 2018-05-17 MED ORDER — FUROSEMIDE 80 MG PO TABS
80.0000 mg | ORAL_TABLET | Freq: Two times a day (BID) | ORAL | Status: DC
  Administered 2018-05-17 – 2018-05-24 (×14): 80 mg via ORAL
  Filled 2018-05-17 (×14): qty 1

## 2018-05-17 MED ORDER — FUROSEMIDE 10 MG/ML IJ SOLN
40.0000 mg | Freq: Two times a day (BID) | INTRAMUSCULAR | Status: DC
  Administered 2018-05-17: 40 mg via INTRAVENOUS
  Filled 2018-05-17: qty 4

## 2018-05-17 MED ORDER — IPRATROPIUM-ALBUTEROL 0.5-2.5 (3) MG/3ML IN SOLN
3.0000 mL | Freq: Once | RESPIRATORY_TRACT | Status: AC
  Administered 2018-05-17: 3 mL via RESPIRATORY_TRACT

## 2018-05-17 MED ORDER — FUROSEMIDE 10 MG/ML IJ SOLN
INTRAMUSCULAR | Status: AC
  Filled 2018-05-17: qty 2

## 2018-05-17 NOTE — Significant Event (Addendum)
Rapid Response Event Note  Overview: Time Called: 0042 Arrival Time: 0045 Event Type: Respiratory  Initial Focused Assessment: Upon arrival, Mr. Dineen was sitting up in a high fowlers position in the bed.  Joaquim Lai RN and Jerrye Bushy. RRT were at the bedside.  Mr. Ohms had a NRB mask on and sats were 100 %. BBS diminished with bibasilar crackles.  More diminished on R than L. Recent nebulizer tx had been given.  Mr. Treichler states that his breathing now is better.  He is using some accessory muscle use and his RR 20-25.  C. Bodenheimer notified and orders received.  Mr. Groh is a DNR.   Interventions: -Duoneb per APP -Lasix per APP -PCXR -Wean NRB to [place] (if not transferred): -Duoneb -keep HOB elevated > 30 degrees -Notify primary svc and/or RRRN if any further clinical decompensations -Re-evaluate after Lasix administered.  Event Summary: At 0108: HR 103, RR 20, 152/56, sats 98% on 10L HFNC.   Addendum: 0445- temp 98 F, HR 93, BP 151/81, RR 20 sats 98% on HFNC 9L  Madelynn Done

## 2018-05-17 NOTE — Progress Notes (Signed)
Titrating O2 based on sats

## 2018-05-17 NOTE — Progress Notes (Signed)
PROGRESS NOTE    [name]  ELF:[id number] DOB: June 03, 1929 DOA: 05/14/2018 PCP: Unk Pinto, MD   Brief Narrative:  [name] is a 84 y.o. male with medical history significant of HTN, Chronic Venous InsufficiencyHLD, Prediabetes, Hypothyroidism, Hx Gout, Metastatic Renal Cell Carcinomas/p Nephrectomy, osteomyelitis of right foot and other comorbids who presented with progressive worsening shortness of breath and dyspnea on exertion has had occasional cough but no chest pain. Cough is nonproductive no associated fever has been falling asleep frequently and if any exertion he gets very short of breath he has been seen by his Cardiologist who felt that he was doing well he has chronic leg edema for which she takes Bumex but slightly been getting worse. Has not Been told in the past he has heart failure.  This morning he woke up with progressively worsening shortness of breath and they called 911 patient had crackles on exam per EMS and became very tachypneic with stridor with minimal exertion.  EMS initially satting 93% on room air improved to 98% on 6 L. Reports black stools but has been on iron. Last colonoscopy was over 10 years ago and he opted not to repeat. Done by [name].  He was found to have worsening anemia and Hemoccult positive from below. CXR worrisome for fluid overload and currently being diuresed. Diuresis was stopped due to AKI on CKD but overnight patient decompensated and ended up in respiratory distress so diuresis was resumed.  Discussed the case briefly with Dr. Jimmy Footman curbside who recommended continued diuresis however he states change to p.o. state of IV Lasix at this time.  If creatinine continues to worsen nephrology will do a formal consultation tomorrow  Assessment & Plan:   Active Problems:   Cancer of kidney (HCC)   GERD (gastroesophageal reflux disease)   CKD, stage IV (GFR 28 ml/min) (HCC)   Hypothyroidism   Hyperlipidemia   CAD (coronary artery  disease), native coronary artery   Essential hypertension   Anemia   Acute respiratory failure (HCC)   Occult blood in stools   Fluid overload   Reactive airway disease  Acute Respiratory Failure with Hypoxia likely from Volume Overload, Symptomatic Anemia, and Possible COPD Exacerbation -Multifactorial and likely a combination of fluid overload versus reactive airway disease with known history of tobacco abuse in the past.   -Obtained ECHOCARDIOGRAM and limited study but normal Bilateral Ventricular Systolic Function and Normal LV Diastolid Dysfunction normal -Cycle Cardiac Enzymes and Flat Trend 0.03 x3  -Also treat for possible COPD Exacerbation and given Methylprednisolone 60 mg IV Daily and changed to Prednisone 40 mg po Daily x 4 days subsequent (Day 1 of Prednisone)  -C/w Mometasone-Formoterol 2 puff IH BID and Albuterol 2.5 mg Neb q1hprn SOB -Continued Furosemide 40 mg q12h but now Discontinued given worsen Renal Function; Given IV Lasix over night and to p.o. 80 mg twice daily at the recommendation of Dr. Jimmy Footman in my conversation with him  -Added Guaifenesin 600 mg po BID Daily and changed to 1200 mg po BID -Denies any chest pain has bilateral lower extremity swelling -History wise PE being less likely -CXR done this AM showed The lungs are well-expanded. The interstitial markings remain increased. The cardiac silhouette is enlarged. The pulmonary vascularity is mildly engorged. There is a small left pleural effusion. There is calcification in the wall of the thoracic aorta. There are degenerative changes of both shoulders.  -Likely volume overloaded as Kidney (has one from Nephrectomy) isn't clearing as properly and  because of poor Nutritional Status -O2 Requirement Weaning as tolerated    Normocytic Anemia  -Obtained Anemia Panel iron level 25, TIBC 311, TIBC 336, saturation ratio 7%, ferritin level of 116, folate of 33.5, and vitamin B12 of 1240 -Family states he has history  of hemorrhoids and has had black stools but on iron pills  -Hb/Hct went from 9.5/29.8 -> 9.3/29.3 -> 8.0/25.1 -> 8.3/26.4 -Has refused in the past for further GI work-up  -Continue to Monitor family agreeable to transfusions if needed -Transfuse <7.0 -Repeat CBC in AM   CAD (coronary artery disease), native coronary artery -Hold Aspirin for now given occult positive and worsening anemia. -Continue Pravastatin 40 mg po Daily  Cancer of Kidney s/p Nephrectomy with Metastasis to the Lung -Stable  -Recent CT of the Chest showed no new metastases -PET CT of the chest done in March 2019 showed no evidence of recurrent disease  AKI on CKD, Stage IV  -BUN/Cr went from 50/2.23 -> 46/2.12 -> 61/2.72 -> 71/2.82 -Likely worsened from Diuresis; Will stop IV Diuresisand received a dose overnight -Discussed case with Nephrology who recommended continuing Diuresis except not with IV and recommended oral Lasix 80 mg po BID  -Currently at baseline avoid nephrotoxic medications  COPD Exacerbation -C/w Mometasone-Formoterol 2 puff IH BID and Albuterol 2.5 mg Neb q1hprn SOB -Added po Doxycycline 100 mg po q12h -Given Methylprednisolone 60 mg IV Daily and changed to Prednisone 40 mg po Daily x 4 more days (Day 2 of Prednisone)  -WBC elevated slightly likely to IV Steroid Demargination  -Given Guaifenesin 600 mg po BID and increased to 1200 mg po BID  Essential Hypertension -Held Norvasc given worsening lower extremity swelling -Given Furosemide 40 mg q12h but will stop now and start po Diuresis -BP this Afternoon was 144/58  GERD  -Stable  -Not on home Medications but will start Protonix BID given Steroids  Hyperlipidemia -C/w Pravastatin 40 mg po daily qHS  Hypothyroidism -Checking TSH and was 2.350 -C/w Levothyroxine 200 mcg  Occult blood in Stools  -Patient has refused GI work-up in the past has history of hemorrhoids -FOBT + -Continue to monitor CBC   Fluid overload no past history of  CHF  -No ECHOCardiogram in the system. -Will obtain ECHOCardiogram; Showed no evidence of CHF -Cycle Troponins and Initial Troponin was 0.02 and repeat was 0.03 x3 -BNP was 312.9 -Continued IV gentle diuresis and monitor fluid status; and Now stopped -Strict I's/O's and Daily Weights; Patient is -878 mL -Likely in the setting of poor Nutritional Status and poor Renal Clearance  -If abnormal ECHOCARDIOGRAM will need cardiology consult  Reactive Airway Disease patient with history of Tobacco Abuse  -Currently with significant wheezing on admission.   -Will treat for possible COPD exacerbation contributed to acute respiratory failure as above   Somnolence -Checked VBG 7.373/42.5/66.0/24.7/92.0 for CO2 retention and treat as needed -Improving   Diabetes Mellitus Type 2 -Started on Moderate Novolog AC/HS -Checked TSH and was 2.350 and HgA1C was 6.2 -Not on Home Medications -CBG ranging from 108-167  Gout  -Continue home medications with Allopurinol 300 mg po Daily   Hyperphosphatemia -Patient's phosphorus level this morning is 5.0 -Continue to monitor -Repeat phosphorus level in a.m.  Leukocytosis -Likely in the setting of IV steroids and p.o. prednisone demargination -Patient empirically started on antibiotics with doxycycline for COPD exacerbation -Continue to monitor for signs and symptoms of infection -Repeat CBC in a.m.  DVT prophylaxis: SCDs Code Status: DO NOT RESUSCITATE  Family Communication: Discussed  with Daughter and family at bedside Disposition Plan: SNF at D/C if agreeable  Consultants:   Discussed case with Nephrology Dr. Jimmy Footman curbside   Procedures:  ECHOCARDIOGRAM ------------------------------------------------------------------- Study Conclusions  - Left ventricle: The cavity size was normal. Systolic function was   normal. The estimated ejection fraction was in the range of 60%   to 65%. Wall motion was normal; there were no regional wall    motion abnormalities. Left ventricular diastolic function   parameters were normal.  Impressions:  - Very limited study quality, however there appears to be normal   biventricular systolic function and no significant valvular   abnormalities.   Antimicrobials:  Anti-infectives (From admission, onward)   Start     Dose/Rate Malone Frequency Ordered Stop   05/15/18 2200  doxycycline (VIBRA-TABS) tablet 100 mg     100 mg Oral Every 12 hours 05/15/18 0918 05/20/18 0959   05/15/18 0200  doxycycline (VIBRAMYCIN) 100 mg in sodium chloride 0.9 % 250 mL IVPB  Status:  Discontinued     100 mg 125 mL/hr over 120 Minutes Intravenous Every 12 hours 05/15/18 0032 05/15/18 0918     Subjective: Seen and examined at bedside and says he had a rough night but was feeling better.  Is extremely hard of hearing.  States shortness of breath has settled down.  Daughter states he does not wear any oxygen at home.  No chest pain, lightheadedness or dizziness at this time.  No other concerns or complaints at this time  Objective: Vitals:   05/17/18 0957 05/17/18 1148 05/17/18 1533 05/17/18 1615  BP: (!) 144/69   (!) 144/58  Pulse: 93 91 89 89  Resp: (!) 30 20 20  (!) 30  Temp: 97.7 F (36.5 C)   98 F (36.7 C)  TempSrc: Oral     SpO2: 99% 95% 98% 96%  Weight:      Height:        Intake/Output Summary (Last 24 hours) at 05/17/2018 1623 Last data filed at 05/17/2018 1446 Gross per 24 hour  Intake 740 ml  Output 1150 ml  Net -410 ml   Filed Weights   05/15/18 0118 05/15/18 2053 05/16/18 2050  Weight: 123.1 kg (271 lb 6.2 oz) 122.9 kg (270 lb 15.1 oz) 123.1 kg (271 lb 6.2 oz)   Examination: Physical Exam:  Constitutional: Well-nourished, well-developed morbidly obese Caucasian male is currently in no acute distress appears calm Eyes: Sclera anicteric. Lids and conjunctive are normal ENMT: Appear normal. Extremely hard of hearing.  Mucous members are moist Neck: Supple with no JVD Respiratory:  Diminished to auscultation bilaterally with mild wheezing and slight crackles.  No appreciable rhonchi or wheezing Cardiovascular: Regular rate and rhythm.  Has a slight systolic murmur.  Has 1-2+ lower extremity edema Abdomen: Soft, nontender, distended secondary body habitus.  Bowel sounds present all 4 quadrants GU: Deferred Musculoskeletal: Has a right foot formerly from bone removal.  No contractures or cyanosis Skin: Skin is warm and dry with no appreciable rashes or lesions but does have chronic venous stasis changes with 1-2+ lower externally pitting edema Neurologic: Cranial nerves II through XII grossly intact no appreciable focal deficits With exception of that he is extremely hard of hearing and wears a right hearing aid Psychiatric: Normal mood and affect.  Intact judgment insight.  Patient is alert and awake  Data Reviewed: I have personally reviewed following labs and imaging studies  CBC: Recent Labs  Lab 05/14/18 2113 05/15/18 0542 05/16/18 0938 05/17/18 1829  WBC 10.0 9.0 10.8* 12.6*  NEUTROABS  --   --  9.4* 10.0*  HGB 9.5* 9.3* 8.0* 8.3*  HCT 29.8* 29.3* 25.1* 26.4*  MCV 98.7 99.0 98.4 98.5  PLT 241 251 215 330   Basic Metabolic Panel: Recent Labs  Lab 05/14/18 2113 05/15/18 0542 05/16/18 0517 05/17/18 0635  NA 137 140 139 141  K 5.1 4.4 4.9 3.7  CL 104 107 108 108  CO2 24 24 20* 25  GLUCOSE 157* 154* 131* 118*  BUN 50* 46* 61* 71*  CREATININE 2.23* 2.12* 2.72* 2.82*  CALCIUM 8.6* 8.6* 8.0* 8.2*  MG 2.4 2.3 2.4 2.6*  PHOS  --  4.3 5.2* 5.0*   GFR: Estimated Creatinine Clearance: 23.9 mL/min (A) (by C-G formula based on SCr of 2.82 mg/dL (H)). Liver Function Tests: Recent Labs  Lab 05/15/18 0542 05/16/18 0517 05/17/18 0635  AST 19 21 19   ALT 18 15 18   ALKPHOS 80 61 61  BILITOT 0.7 0.7 0.5  PROT 6.9 5.9* 6.2*  ALBUMIN 2.9* 2.7* 2.9*   No results for input(s): LIPASE, AMYLASE in the last 168 hours. No results for input(s): AMMONIA in the  last 168 hours. Coagulation Profile: No results for input(s): INR, PROTIME in the last 168 hours. Cardiac Enzymes: Recent Labs  Lab 05/15/18 0029 05/15/18 0542 05/15/18 1144  TROPONINI 0.03* 0.03* 0.03*   BNP (last 3 results) No results for input(s): PROBNP in the last 8760 hours. HbA1C: Recent Labs    05/15/18 0542  HGBA1C 6.2*   CBG: Recent Labs  Lab 05/16/18 1203 05/16/18 1642 05/16/18 2053 05/17/18 0817 05/17/18 1158  GLUCAP 156* 168* 150* 108* 167*   Lipid Profile: No results for input(s): CHOL, HDL, LDLCALC, TRIG, CHOLHDL, LDLDIRECT in the last 72 hours. Thyroid Function Tests: Recent Labs    05/15/18 0542  TSH 2.350   Anemia Panel: Recent Labs    05/15/18 0542  VITAMINB12 1,240*  FOLATE 33.5  FERRITIN 116  TIBC 336  IRON 25*  RETICCTPCT 2.2   Sepsis Labs: No results for input(s): PROCALCITON, LATICACIDVEN in the last 168 hours.  Recent Results (from the past 240 hour(s))  MRSA PCR Screening     Status: None   Collection Time: 05/15/18  6:44 AM  Result Value Ref Range Status   MRSA by PCR NEGATIVE NEGATIVE Final    Comment:        The GeneXpert MRSA Assay (FDA approved for NASAL specimens only), is one component of a comprehensive MRSA colonization surveillance program. It is not intended to diagnose MRSA infection nor to guide or monitor treatment for MRSA infections. Performed at [facility], [place] [address], [place], [place] [address]     Radiology Studies: Dg Chest Port 1 View  Result Date: 05/17/2018 CLINICAL DATA:  Shortness of breath, coronary artery disease, stage 4 chronic kidney disease EXAM: PORTABLE CHEST 1 VIEW COMPARISON:  Portable chest x-ray of May 17, 2018 FINDINGS: The lungs are well-expanded. The interstitial markings remain increased. The cardiac silhouette is enlarged. The pulmonary vascularity is mildly engorged. There is a small left pleural effusion. There is calcification in the wall of the thoracic aorta.  There are degenerative changes of both shoulders. IMPRESSION: CHF superimposed upon chronic bronchitic changes. No alveolar pneumonia. Thoracic aortic atherosclerosis. Electronically Signed   By: David  Martinique M.D.   On: 05/17/2018 07:29   Dg Chest Port 1 View  Result Date: 05/17/2018 CLINICAL DATA:  Shortness of breath. EXAM: PORTABLE CHEST 1 VIEW  COMPARISON:  Radiograph yesterday at 0616 hour FINDINGS: Unchanged cardiomegaly and mediastinal contours. There is aortic atherosclerosis. Increasing hazy opacity at the lung bases suggesting pleural effusions and atelectasis. Peribronchial cuffing suspicious for pulmonary edema. No pneumothorax. Incidental azygos fissure. IMPRESSION: Increasing hazy opacity at the lung bases likely increasing pleural effusions and atelectasis. Peribronchial cuffing suspicious for pulmonary edema with similar vascular congestion. Cardiomegaly and Aortic Atherosclerosis (ICD10-I70.0). Electronically Signed   By: Jeb Levering M.D.   On: 05/17/2018 01:40   Dg Chest Port 1 View  Result Date: 05/16/2018 CLINICAL DATA:  Shortness of breath EXAM: PORTABLE CHEST 1 VIEW COMPARISON:  May 14, 2018 FINDINGS: There is atelectatic change in the mid lower lung zones with small left pleural effusion. There is a slight degree of interstitial edema. Heart is enlarged with pulmonary vascularity normal. No adenopathy. There is aortic atherosclerosis. There is degenerative change in each shoulder. Postoperative changes noted in the lower cervical spine. Incidental note is made of an azygos lobe on the right, an anatomic variant. IMPRESSION: Cardiomegaly with small left pleural effusion. Lower lung zone atelectasis with slight interstitial pulmonary edema. No frank consolidation evident. There is aortic atherosclerosis. Aortic Atherosclerosis (ICD10-I70.0). Electronically Signed   By: Lowella Grip III M.D.   On: 05/16/2018 07:30   Scheduled Meds: . allopurinol  300 mg Oral Daily  . aspirin  EC  81 mg Oral Daily  . doxycycline  100 mg Oral Q12H  . furosemide  80 mg Oral BID  . guaiFENesin  1,200 mg Oral BID  . insulin aspart  0-15 Units Subcutaneous TID WC  . insulin aspart  0-5 Units Subcutaneous QHS  . ipratropium-albuterol  3 mL Nebulization QID  . levothyroxine  200 mcg Oral QAC breakfast  . mometasone-formoterol  2 puff Inhalation BID  . pantoprazole  40 mg Oral BID  . pravastatin  40 mg Oral QHS  . predniSONE  40 mg Oral Q breakfast  . sodium chloride flush  3 mL Intravenous Q12H   Continuous Infusions: . sodium chloride      LOS: 2 days   Kerney Elbe, DO [facility] Pager [phone]  If 7PM-7AM, please contact night-coverage [email or web address] Password [facility] 05/17/2018, 4:23 PM

## 2018-05-17 NOTE — Plan of Care (Signed)
  Problem: Clinical Measurements: Goal: Respiratory complications will improve Outcome: Progressing Note:  Pt remained on hi-flow O2, but has been weaned slowly by RN. Pt started shift at 10L and is now on 8L. Will continue to monitor during my shift for signs of distress.

## 2018-05-17 NOTE — Progress Notes (Addendum)
Patient called to the nurses station stating he was having trouble breathing. Upon arrival, RN found patient slouched towards the end of the bed, gasping for air with expiratory wheezes and face flushed. RN and NT helped patient sit up in the bed. Vitals obtained- BP 173/86, Pulse 102, Resp 25, 84% on 2L of oxygen via nasal cannula. RN increased flow rate to 5L and patients O2 saturation improved to only 87%. RN called respiratory. RT recommended to place patient on non-rebreather. RT Sarah bedside. Patient saturation at 100%. Patient states that he feels much better with the non-rebreather.  Rapis response called and at bedside. Assessed patient. RN notified on call NP, Bodenheimer. MD recommended to retrieve a portable CXR, administered IV 20mg  of Lasix x1, adminsiter Duoneb and to change patient from NRB to high flow nasal cannula.  Respiratory, Sarah, administered PRN nebulizer and Duoneb. Placed patient on HFNC at 10L. Patient sats 98%.  All other orders implemented.  RN advised to keep patient's head elevated. If patient continues to have difficulty breathing to change patient back from HFNC to NRB and to notify RRT and RR.  RN will continue to monitor patient.

## 2018-05-17 NOTE — NC FL2 (Signed)
[place] LEVEL OF CARE SCREENING TOOL     IDENTIFICATION  Patient Name: [name] Birthdate: 1928/11/11 Sex: male Admission Date (Current Location): 05/14/2018  [facility] and [place] Number:  Herbalist and Address:  [facility], [place] [address], [place], [place] [address]      Provider Number: [id number]  Attending Physician Name and Address:  Kerney Elbe, DO  Relative Name and Phone Number:  Juliann Pulse, daughter, [phone]    Current Level of Care: Hospital Recommended Level of Care: [place] Prior Approval Number:    Date Approved/Denied:   PASRR Number: [id number]  Discharge Plan: SNF    Current Diagnoses: Patient Active Problem List   Diagnosis Date Noted  . Fluid overload 05/15/2018  . Reactive airway disease 05/15/2018  . Acute respiratory failure ([place]) 05/14/2018  . Occult blood in stools 05/14/2018  . Anemia 09/15/2016  . Chronic pain syndrome   . Essential hypertension   . Atherosclerosis of aorta ([place]) 05/30/2016  . 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 Non compliance with medical treatment 08/20/2015  . Gout 08/18/2015  . CAD (coronary artery disease), native coronary artery   . Morbid obesity ([place])   . Spinal stenosis   . Medication management 01/23/2014  . Hyperlipidemia 11/24/2013  . DJD   . Cancer of kidney ([place])   . GERD (gastroesophageal reflux disease)   . CKD, stage IV (GFR 28 ml/min) (HCC)   . Hypothyroidism   . Vitamin D deficiency   . Prediabetes     Orientation RESPIRATION BLADDER Height & Weight     Self, Time, Situation, Place  O2(Nasal cannula 6L) Continent Weight: 123.1 kg (271 lb 6.2 oz) Height:  5\' 9"  (175.3 cm)  BEHAVIORAL SYMPTOMS/MOOD NEUROLOGICAL BOWEL NUTRITION STATUS      Continent Diet(Please see DC Summary)  AMBULATORY STATUS COMMUNICATION OF NEEDS Skin   Limited Assist Verbally Normal                    Personal Care Assistance Level of Assistance  Bathing, Feeding, Dressing Bathing Assistance: Maximum assistance Feeding assistance: Limited assistance Dressing Assistance: Limited assistance     Functional Limitations Info  Sight, Hearing, Speech Sight Info: Adequate Hearing Info: Impaired Speech Info: Adequate(Dentures)    SPECIAL CARE FACTORS FREQUENCY  PT (By licensed PT), OT (By licensed OT)     PT Frequency: 5x/week OT Frequency: 3x/week            Contractures      Additional Factors Info  Code Status, Allergies, Insulin Sliding Scale Code Status Info: DNR Allergies Info: NKA   Insulin Sliding Scale Info: 3x dialy with meals and at bedtime       Current Medications (05/17/2018):  This is the current hospital active medication list Current Facility-Administered Medications  Medication Dose Route Frequency Provider Last Rate Last Dose  . 0.9 %  sodium chloride infusion  250 mL Intravenous PRN Toy Baker, MD      . acetaminophen (TYLENOL) tablet 650 mg  650 mg Oral Q6H PRN Toy Baker, MD   650 mg at 05/15/18 0140   Or  . acetaminophen (TYLENOL) suppository 650 mg  650 mg Rectal Q6H PRN Doutova, Anastassia, MD      . albuterol (PROVENTIL) (2.5 MG/3ML) 0.083% nebulizer solution 2.5 mg  2.5 mg Nebulization Q1H PRN Doutova, Anastassia, MD   2.5 mg at 05/17/18 0101  . allopurinol (ZYLOPRIM)  tablet 300 mg  300 mg Oral Daily Doutova, Anastassia, MD   300 mg at 05/17/18 1029  . aspirin EC tablet 81 mg  81 mg Oral Daily Doutova, Anastassia, MD   81 mg at 05/17/18 1029  . doxycycline (VIBRA-TABS) tablet 100 mg  100 mg Oral Q12H Sheikh, Georgina Quint [place], DO   100 mg at 05/17/18 1030  . furosemide (LASIX) tablet 80 mg  80 mg Oral BID Sheikh, Omair Latif, DO      . guaiFENesin [facility]) 12 hr tablet 1,200 mg  1,200 mg Oral BID Sheikh, Omair Latif, DO   1,200 mg at 05/17/18 1029  . insulin aspart (novoLOG) injection 0-15 Units  0-15 Units Subcutaneous TID WC  Toy Baker, MD   3 Units at 05/17/18 1228  . insulin aspart (novoLOG) injection 0-5 Units  0-5 Units Subcutaneous QHS Doutova, Anastassia, MD      . ipratropium-albuterol (DUONEB) 0.5-2.5 (3) MG/3ML nebulizer solution 3 mL  3 mL Nebulization QID Toy Baker, MD   3 mL at 05/17/18 1533  . levothyroxine (SYNTHROID, LEVOTHROID) tablet 200 mcg  200 mcg Oral QAC breakfast Toy Baker, MD   200 mcg at 05/17/18 0751  . mometasone-formoterol (DULERA) 200-5 MCG/ACT inhaler 2 puff  2 puff Inhalation BID Toy Baker, MD   2 puff at 05/17/18 0809  . ondansetron (ZOFRAN) tablet 4 mg  4 mg Oral Q6H PRN Toy Baker, MD       Or  . ondansetron (ZOFRAN) injection 4 mg  4 mg Intravenous Q6H PRN Doutova, Anastassia, MD      . pantoprazole (PROTONIX) EC tablet 40 mg  40 mg Oral BID Sheikh, Omair Latif, DO   40 mg at 05/17/18 1030  . pravastatin (PRAVACHOL) tablet 40 mg  40 mg Oral QHS Toy Baker, MD   40 mg at 05/16/18 2127  . predniSONE (DELTASONE) tablet 40 mg  40 mg Oral Q breakfast Doutova, Anastassia, MD   40 mg at 05/17/18 0751  . sodium chloride flush (NS) 0.9 % injection 3 mL  3 mL Intravenous Q12H Doutova, Anastassia, MD   3 mL at 05/17/18 1031  . sodium chloride flush (NS) 0.9 % injection 3 mL  3 mL Intravenous PRN Toy Baker, MD         Discharge Medications: Please see discharge summary for a list of discharge medications.  Relevant Imaging Results:  Relevant Lab Results:   Additional Information ss#[id number].  Benard Halsted, LCSWA

## 2018-05-18 ENCOUNTER — Inpatient Hospital Stay (HOSPITAL_COMMUNITY): Payer: Medicare Other

## 2018-05-18 LAB — CBC WITH DIFFERENTIAL/PLATELET
ABS IMMATURE GRANULOCYTES: 0.2 10*3/uL — AB (ref 0.0–0.1)
BASOS ABS: 0 10*3/uL (ref 0.0–0.1)
BASOS PCT: 0 %
Eosinophils Absolute: 0 10*3/uL (ref 0.0–0.7)
Eosinophils Relative: 0 %
HCT: 27.5 % — ABNORMAL LOW (ref 39.0–52.0)
Hemoglobin: 8.7 g/dL — ABNORMAL LOW (ref 13.0–17.0)
IMMATURE GRANULOCYTES: 2 %
Lymphocytes Relative: 10 %
Lymphs Abs: 1.1 10*3/uL (ref 0.7–4.0)
MCH: 31.4 pg (ref 26.0–34.0)
MCHC: 31.6 g/dL (ref 30.0–36.0)
MCV: 99.3 fL (ref 78.0–100.0)
Monocytes Absolute: 0.8 10*3/uL (ref 0.1–1.0)
Monocytes Relative: 7 %
NEUTROS ABS: 8.9 10*3/uL — AB (ref 1.7–7.7)
NEUTROS PCT: 81 %
PLATELETS: 236 10*3/uL (ref 150–400)
RBC: 2.77 MIL/uL — ABNORMAL LOW (ref 4.22–5.81)
RDW: 14.9 % (ref 11.5–15.5)
WBC: 10.9 10*3/uL — AB (ref 4.0–10.5)

## 2018-05-18 LAB — COMPREHENSIVE METABOLIC PANEL
ALT: 27 U/L (ref 0–44)
AST: 31 U/L (ref 15–41)
Albumin: 2.9 g/dL — ABNORMAL LOW (ref 3.5–5.0)
Alkaline Phosphatase: 69 U/L (ref 38–126)
Anion gap: 11 (ref 5–15)
BUN: 73 mg/dL — AB (ref 8–23)
CHLORIDE: 105 mmol/L (ref 98–111)
CO2: 24 mmol/L (ref 22–32)
CREATININE: 2.77 mg/dL — AB (ref 0.61–1.24)
Calcium: 8.1 mg/dL — ABNORMAL LOW (ref 8.9–10.3)
GFR calc non Af Amer: 19 mL/min — ABNORMAL LOW (ref >60)
GFR, EST AFRICAN AMERICAN: 22 mL/min — AB (ref >60)
Glucose, Bld: 124 mg/dL — ABNORMAL HIGH (ref 70–99)
POTASSIUM: 4.1 mmol/L (ref 3.5–5.1)
SODIUM: 140 mmol/L (ref 135–145)
Total Bilirubin: 0.7 mg/dL (ref 0.3–1.2)
Total Protein: 6.4 g/dL — ABNORMAL LOW (ref 6.5–8.1)

## 2018-05-18 LAB — PHOSPHORUS: PHOSPHORUS: 5.3 mg/dL — AB (ref 2.5–4.6)

## 2018-05-18 LAB — GLUCOSE, CAPILLARY
GLUCOSE-CAPILLARY: 111 mg/dL — AB (ref 70–99)
GLUCOSE-CAPILLARY: 155 mg/dL — AB (ref 70–99)
Glucose-Capillary: 107 mg/dL — ABNORMAL HIGH (ref 70–99)
Glucose-Capillary: 192 mg/dL — ABNORMAL HIGH (ref 70–99)

## 2018-05-18 LAB — MAGNESIUM: Magnesium: 2.5 mg/dL — ABNORMAL HIGH (ref 1.7–2.4)

## 2018-05-18 MED ORDER — IPRATROPIUM-ALBUTEROL 0.5-2.5 (3) MG/3ML IN SOLN
3.0000 mL | Freq: Three times a day (TID) | RESPIRATORY_TRACT | Status: DC
  Administered 2018-05-18 – 2018-05-19 (×3): 3 mL via RESPIRATORY_TRACT
  Filled 2018-05-18 (×3): qty 3

## 2018-05-18 MED ORDER — PRO-STAT SUGAR FREE PO LIQD
30.0000 mL | Freq: Two times a day (BID) | ORAL | Status: DC
  Administered 2018-05-18 – 2018-05-23 (×11): 30 mL via ORAL
  Filled 2018-05-18 (×11): qty 30

## 2018-05-18 MED ORDER — GLUCERNA SHAKE PO LIQD
237.0000 mL | Freq: Every day | ORAL | Status: DC
  Administered 2018-05-18 – 2018-05-23 (×6): 237 mL via ORAL
  Filled 2018-05-18 (×8): qty 237

## 2018-05-18 MED ORDER — SODIUM CHLORIDE 0.9 % IV SOLN
1.0000 g | INTRAVENOUS | Status: AC
  Administered 2018-05-18 – 2018-05-24 (×7): 1 g via INTRAVENOUS
  Filled 2018-05-18 (×7): qty 10

## 2018-05-18 MED ORDER — SODIUM CHLORIDE 3 % IN NEBU
4.0000 mL | INHALATION_SOLUTION | Freq: Every day | RESPIRATORY_TRACT | Status: AC
  Administered 2018-05-18 – 2018-05-20 (×3): 4 mL via RESPIRATORY_TRACT
  Filled 2018-05-18 (×3): qty 4

## 2018-05-18 MED ORDER — GLUCERNA 1.2 CAL PO LIQD
237.0000 mL | Freq: Every morning | ORAL | Status: DC
  Filled 2018-05-18 (×2): qty 237

## 2018-05-18 NOTE — Progress Notes (Signed)
Initial Nutrition Assessment  DOCUMENTATION CODES:   Obesity unspecified  INTERVENTION:   Pro-Stat 30 mL BID, each packet provides 15 grams of protein and 100 kcals  Glucerna Shake po daily, each supplement provides 220 kcal and 10 grams of protein  NUTRITION DIAGNOSIS:   Increased nutrient needs related to acute illness as evidenced by estimated needs.  GOAL:   Patient will meet greater than or equal to 90% of their needs  MONITOR:   PO intake, Supplement acceptance, Labs, Weight trends  REASON FOR ASSESSMENT:   Consult COPD Protocol  ASSESSMENT:    84 yo male with hx of HTN, chronic venous insufficiency, HLD, pre-DM, gout, metastatic renal cell carcinoma s/p neprhectomy, osteomyelitis of right foot admitted with worsening SOB with acute respiratory failure from volume overload, symptomatic anemia and COPD exacerbation. Pt also with AKI on CKD stage IV.    Pt eating 50-100% of meals; pt reports he ate 75% of breakfast this AM. Appetite good. Pt reports he eats 2 good meals per day at home, snacks on chips.   No weight loss; weight up since March of this year but stable this admission; pt weighed 271 pounds yesterday, 271 pounds today. Weight in March 249 pounds. Noted pt does have significant bilateral LE edema  Monitor phosphorus level, if po intake remains adequate, pt may benefit from dietary phosphorus restriction  Labs: phosphorus 5.3 (H), BUN 73, Creatinine 2.77, CBGs 107-167 Meds: lasix, prednisone, novolog with meals  NUTRITION - FOCUSED PHYSICAL EXAM:    Most Recent Value  Orbital Region  No depletion  Upper Arm Region  No depletion  Thoracic and Lumbar Region  No depletion  Buccal Region  No depletion  Temple Region  No depletion  Clavicle Bone Region  No depletion  Clavicle and Acromion Bone Region  No depletion  Scapular Bone Region  No depletion  Dorsal Hand  No depletion  Patellar Region  No depletion  Anterior Thigh Region  No depletion   Posterior Calf Region  No depletion  Edema (RD Assessment)  Severe [bilateral LE]       Diet Order:   Diet Order           Diet Carb Modified Fluid consistency: Thin; Room service appropriate? Yes  Diet effective now          EDUCATION NEEDS:   Education needs have been addressed  Skin:  Skin Assessment: Reviewed RN Assessment  Last BM:  7/9  Height:   Ht Readings from Last 1 Encounters:  05/14/18 5\' 9"  (1.753 m)    Weight:   Wt Readings from Last 1 Encounters:  05/16/18 271 lb 6.2 oz (123.1 kg)    Ideal Body Weight:  73 kg  BMI:  Body mass index is 40.08 kg/m.  Estimated Nutritional Needs:   Kcal:  8466-5993 kcals  Protein:  95-105 g  Fluid:  >/= 1.5 L    Kerman Passey MS, RD, LDN, CNSC [phone] Pager  [phone] Weekend/On-Call Pager

## 2018-05-18 NOTE — Care Management Important Message (Signed)
Important Message  Patient Details  Name: [name] MRN: [id number] Date of Birth: 1929/02/10   Medicare Important Message Given:  Yes    [name] 05/18/2018, 10:22 AM

## 2018-05-18 NOTE — Progress Notes (Signed)
PROGRESS NOTE    THAXTON Malone  XVQ:[id number] DOB: Jan 25, 1929 DOA: 05/14/2018 PCP: Unk Pinto, MD   Brief Narrative:  [name] is a 84 y.o. male with medical history significant of HTN, Chronic Venous InsufficiencyHLD, Prediabetes, Hypothyroidism, Hx Gout, Metastatic Renal Cell Carcinomas/p Nephrectomy, osteomyelitis of right foot and other comorbids who presented with progressive worsening shortness of breath and dyspnea on exertion has had occasional cough but no chest pain. Cough is nonproductive no associated fever has been falling asleep frequently and if any exertion he gets very short of breath he has been seen by his Cardiologist who felt that he was doing well he has chronic leg edema for which she takes Bumex but slightly been getting worse. Has not Been told in the past he has heart failure.  This morning he woke up with progressively worsening shortness of breath and they called 911 patient had crackles on exam per EMS and became very tachypneic with stridor with minimal exertion.  EMS initially satting 93% on room air improved to 98% on 6 L. Reports black stools but has been on iron. Last colonoscopy was over 10 years ago and he opted not to repeat. Done by [name].  He was found to have worsening anemia and Hemoccult positive from below. CXR worrisome for fluid overload and currently being diuresed. IV Diuresis was stopped due to AKI on CKD but overnight patient decompensated and ended up in respiratory distress so diuresis was resumed.  Discussed the case briefly with Dr. Jimmy Footman curbside who recommended to continue diuresis however he states change to p.o. state of IV Lasix at this time. CXR appeared to have a possible PNA today so he was started on IV Ceftriaxone.   Assessment & Plan:   Active Problems:   Cancer of kidney (HCC)   GERD (gastroesophageal reflux disease)   CKD, stage IV (GFR 28 ml/min) (HCC)   Hypothyroidism   Hyperlipidemia   CAD (coronary artery  disease), native coronary artery   Essential hypertension   Anemia   Acute respiratory failure (HCC)   Occult blood in stools   Fluid overload   Reactive airway disease  Acute Respiratory Failure with Hypoxia likely from Volume Overload, Symptomatic Anemia, and COPD Exacerbation and possible PNA -Multifactorial and likely a combination of fluid overload versus reactive airway disease with known history of tobacco abuse in the past.   -Obtained ECHOCARDIOGRAM and limited study but normal Bilateral Ventricular Systolic Function and Normal LV Diastolid Dysfunction normal -Cycle Cardiac Enzymes and Flat Trend 0.03 x3  -Also treat for possible COPD Exacerbation and given Methylprednisolone 60 mg IV Daily and changed to Prednisone 40 mg po Daily x 4 days subsequent (Day 3 of Prednisone)  -CXR showed Worsening of airspace opacity greatest on the right may reflect asymmetric pulmonary edema but superimposed pneumonia is not excluded. Persistent retrocardiac density which may reflect atelectasis or pneumonia. Trace left pleural effusion. Thoracic aortic atherosclerosis -Check DG 2 view CXR in AM  -C/w Mometasone-Formoterol 2 puff IH BID and Albuterol 2.5 mg Neb q1hprn SOB -Started Furosemide 40 mg q12h but now Discontinued given worsen Renal Function; Given IV Lasix on the night of 7/7-7/8 and changed to p.o. 80 mg twice daily at the recommendation of Dr. Jimmy Footman in my conversation with him  -Added Guaifenesin 600 mg po BID Daily and increasedto 1200 mg po BID -Denies any chest pain has bilateral lower extremity swelling -History wise PE being less likely -Likely volume overloaded as Kidney (has one from Nephrectomy)  isn't clearing as properly and because of poor Nutritional Status -O2 Requirement Weaning as tolerated and now on 6 Liters of HFNC -Will start IV Ceftriaxone because of possible PNA -Had Upper Airway wheezing this AM so will start Hypertonic Saline Nebs -C/w Flutter Valve and  Incentive Spirometry  Normocytic Anemia  -Obtained Anemia Panel iron level 25, TIBC 311, TIBC 336, saturation ratio 7%, ferritin level of 116, folate of 33.5, and vitamin B12 of 1240 -Family states he has history of hemorrhoids and has had black stools but on iron pills  -Hb/Hct went from 9.5/29.8 -> 8.7/27.5 -Has refused in the past for further GI work-up  -Continue to Monitor family agreeable to transfusions if needed -Transfuse <7.0 -Repeat CBC in AM   CAD (coronary artery disease), native coronary artery -Hold Aspirin for now given occult positive and worsening anemia. -Continue Pravastatin 40 mg po Daily  Cancer of Kidney s/p Nephrectomy with Metastasis to the Lung -Stable  -Recent CT of the Chest showed no new metastases -PET CT of the chest done in March 2019 showed no evidence of recurrent disease  AKI on CKD, Stage IV (Prior Nephrectomy) -BUN/Cr went from 50/2.23 -> 46/2.12 -> 61/2.72 -> 71/2.82 -> 73/2.77 -Likely worsened from IV Diuresis; Will stop IV Diuresisand received a dose overnight -Discussed case with Nephrology who recommended continuing Diuresis except not with IV and recommended oral Lasix 80 mg po BID  -Avoid nephrotoxic medications if possible  -Repeat CMP in AM   COPD Exacerbation -C/w Mometasone-Formoterol 2 puff IH BID and Albuterol 2.5 mg Neb q1hprn SOB -Added po Doxycycline 100 mg po q12h -Given Methylprednisolone 60 mg IV Daily and changed to Prednisone 40 mg po Daily x 4 more days (Day 3 of Prednisone)  -WBC elevated slightly likely to IV Steroid Demargination  -Given Guaifenesin 600 mg po BID and increased to 1200 mg po BID  Essential Hypertension -Held Norvasc given worsening lower extremity swelling -Given Furosemide 40 mg q12h but will stop now and start po Diuresis -BP this Afternoon was 149/66  GERD  -Stable  -Not on home Medications but will start Protonix BID given Steroids  Hyperlipidemia -C/w Pravastatin 40 mg po daily  qHS  Hypothyroidism -Checking TSH and was 2.350 -C/w Levothyroxine 200 mcg  Occult blood in Stools  -Patient has refused GI work-up in the past has history of hemorrhoids -FOBT + -Continue to monitor CBC   Fluid overload no past history of CHF  -No ECHOCardiogram in the system. -Will obtain ECHOCardiogram; Showed no evidence of CHF -Cycle Troponins and Initial Troponin was 0.02 and repeat was 0.03 x3 -BNP was 312.9 -Continued IV gentle diuresis and monitor fluid status; and Now stopped -Strict I's/O's and Daily Weights; Patient is -2,058 mL; Weight is +18 lbs since Admission -Likely in the setting of poor Nutritional Status and poor Renal Clearance  -If abnormal ECHOCARDIOGRAM will need cardiology consult  Reactive Airway Disease patient with history of Tobacco Abuse  -Currently with significant wheezing on admission.   -Will treat for possible COPD exacerbation contributed to acute respiratory failure as above   Somnolence, improved  -Checked VBG 7.373/42.5/66.0/24.7/92.0 for CO2 retention and treat as needed -Improved and stable   Diabetes Mellitus Type 2 -Started on Moderate Novolog AC/HS -Checked TSH and was 2.350 and HgA1C was 6.2 -Not on Home Medications -CBG ranging from 107-167  Gout  -Continue home medications with Allopurinol 300 mg po Daily   Hyperphosphatemia -Patient's phosphorus level this morning is 5.0 -Continue to monitor -Repeat Phosphorus level  in a.m.  Leukocytosis, improving  -Likely in the setting of IV steroids and p.o. prednisone demargination -Patient empirically started on antibiotics with doxycycline for COPD exacerbation -Continue to monitor for signs and symptoms of infection -WBC went from 12.6 -> 10.9 -Repeat CBC in a.m.  DVT prophylaxis: SCDs Code Status: DO NOT RESUSCITATE  Family Communication: Discussed with Daughter at bedside Disposition Plan: SNF at D/C if agreeable  Consultants:   Discussed case with Nephrology Dr.  Jimmy Footman curbside on 7/8   Procedures:  ECHOCARDIOGRAM ------------------------------------------------------------------- Study Conclusions  - Left ventricle: The cavity size was normal. Systolic function was   normal. The estimated ejection fraction was in the range of 60%   to 65%. Wall motion was normal; there were no regional wall   motion abnormalities. Left ventricular diastolic function   parameters were normal.  Impressions:  - Very limited study quality, however there appears to be normal   biventricular systolic function and no significant valvular   abnormalities.   Antimicrobials:  Anti-infectives (From admission, onward)   Start     Dose/Rate Route Frequency Ordered Stop   05/18/18 1000  cefTRIAXone (ROCEPHIN) 1 g in sodium chloride 0.9 % 100 mL IVPB     1 g 200 mL/hr over 30 Minutes Intravenous Every 24 hours 05/18/18 0918     05/15/18 2200  doxycycline (VIBRA-TABS) tablet 100 mg     100 mg Oral Every 12 hours 05/15/18 0918 05/20/18 0959   05/15/18 0200  doxycycline (VIBRAMYCIN) 100 mg in sodium chloride 0.9 % 250 mL IVPB  Status:  Discontinued     100 mg 125 mL/hr over 120 Minutes Intravenous Every 12 hours 05/15/18 0032 05/15/18 0918     Subjective: Seen and examined at bedside there is some shortness of breath was about the same.  No chest pain, lightheadedness or dizziness.  Is extremely hard of hearing.  Daughter thinks his lower extremity swelling is slightly improved.  No other concerns or meds at this time and asking when he can go home.  Discussed option of skilled nursing facility with him and he is agreeable for now.   Objective: Vitals:   05/18/18 0458 05/18/18 0829 05/18/18 0836 05/18/18 0900  BP: (!) 157/69   (!) 149/66  Pulse: 90   90  Resp: 20   20  Temp: 97.7 F (36.5 C)   (!) 97.5 F (36.4 C)  TempSrc: Oral   Oral  SpO2: 95% 91% 91% 93%  Weight:      Height:        Intake/Output Summary (Last 24 hours) at 05/18/2018 1349 Last data  filed at 05/18/2018 1142 Gross per 24 hour  Intake 720 ml  Output 2150 ml  Net -1430 ml   Filed Weights   05/15/18 0118 05/15/18 2053 05/16/18 2050  Weight: 123.1 kg (271 lb 6.2 oz) 122.9 kg (270 lb 15.1 oz) 123.1 kg (271 lb 6.2 oz)   Examination: Physical Exam:  Constitutional: Well-nourished, well-developed morbidly obese Caucasian male is currently in no acute distress and appears calm Eyes: Sclera anicteric.  Lids and conjunctive are normal ENMT: External ears and nose appear normal.  Is extremely hard of hearing.  Mucous members are moist Neck: Appears supple with no JVD Respiratory: Diminished to auscultation bilaterally and wheezing and crackles.  He has more upper airway wheezing than in the bases and appears congested.  Patient is wearing supplemental oxygen via nasal cannula and has unlabored breathing Cardiovascular: Regular rate and rhythm.  Has a slight  systolic murmur.  Has 1-2+ lower extremity edema Abdomen: Soft, nontender, distended secondary body habitus.  Bowel sounds present all 4 quadrants GU: Deferred Musculoskeletal: Has a right foot deformity from bone removal with no contractures cyanosis Skin: Skin is warm and dry with no appreciable rashes lesions but does have chronic venous stasis changes in the lower extremities with 1+ lower extremity pitting edema Neurologic: Cranial nerves II through XII grossly intact no appreciable focal deficits Psychiatric: Normal mood and affect.  Intact and insight.  Patient awake and alert and oriented  Data Reviewed: I have personally reviewed following labs and imaging studies  CBC: Recent Labs  Lab 05/14/18 2113 05/15/18 0542 05/16/18 0707 05/17/18 0635 05/18/18 0414  WBC 10.0 9.0 10.8* 12.6* 10.9*  NEUTROABS  --   --  9.4* 10.0* 8.9*  HGB 9.5* 9.3* 8.0* 8.3* 8.7*  HCT 29.8* 29.3* 25.1* 26.4* 27.5*  MCV 98.7 99.0 98.4 98.5 99.3  PLT 241 251 215 215 638   Basic Metabolic Panel: Recent Labs  Lab 05/14/18 2113  05/15/18 0542 05/16/18 0517 05/17/18 0635 05/18/18 0414  NA 137 140 139 141 140  K 5.1 4.4 4.9 3.7 4.1  CL 104 107 108 108 105  CO2 24 24 20* 25 24  GLUCOSE 157* 154* 131* 118* 124*  BUN 50* 46* 61* 71* 73*  CREATININE 2.23* 2.12* 2.72* 2.82* 2.77*  CALCIUM 8.6* 8.6* 8.0* 8.2* 8.1*  MG 2.4 2.3 2.4 2.6* 2.5*  PHOS  --  4.3 5.2* 5.0* 5.3*   GFR: Estimated Creatinine Clearance: 24.4 mL/min (A) (by C-G formula based on SCr of 2.77 mg/dL (H)). Liver Function Tests: Recent Labs  Lab 05/15/18 0542 05/16/18 0517 05/17/18 0635 05/18/18 0414  AST 19 21 19 31   ALT 18 15 18 27   ALKPHOS 80 61 61 69  BILITOT 0.7 0.7 0.5 0.7  PROT 6.9 5.9* 6.2* 6.4*  ALBUMIN 2.9* 2.7* 2.9* 2.9*   No results for input(s): LIPASE, AMYLASE in the last 168 hours. No results for input(s): AMMONIA in the last 168 hours. Coagulation Profile: No results for input(s): INR, PROTIME in the last 168 hours. Cardiac Enzymes: Recent Labs  Lab 05/15/18 0029 05/15/18 0542 05/15/18 1144  TROPONINI 0.03* 0.03* 0.03*   BNP (last 3 results) No results for input(s): PROBNP in the last 8760 hours. HbA1C: No results for input(s): HGBA1C in the last 72 hours. CBG: Recent Labs  Lab 05/17/18 1158 05/17/18 1643 05/17/18 2134 05/18/18 0812 05/18/18 1132  GLUCAP 167* 157* 145* 107* 111*   Lipid Profile: No results for input(s): CHOL, HDL, LDLCALC, TRIG, CHOLHDL, LDLDIRECT in the last 72 hours. Thyroid Function Tests: No results for input(s): TSH, T4TOTAL, FREET4, T3FREE, THYROIDAB in the last 72 hours. Anemia Panel: No results for input(s): VITAMINB12, FOLATE, FERRITIN, TIBC, IRON, RETICCTPCT in the last 72 hours. Sepsis Labs: No results for input(s): PROCALCITON, LATICACIDVEN in the last 168 hours.  Recent Results (from the past 240 hour(s))  MRSA PCR Screening     Status: None   Collection Time: 05/15/18  6:44 AM  Result Value Ref Range Status   MRSA by PCR NEGATIVE NEGATIVE Final    Comment:        The  GeneXpert MRSA Assay (FDA approved for NASAL specimens only), is one component of a comprehensive MRSA colonization surveillance program. It is not intended to diagnose MRSA infection nor to guide or monitor treatment for MRSA infections. Performed at [facility], [place] [address], [place], [place] [address]  Radiology Studies: Dg Chest Port 1 View  Result Date: 05/18/2018 CLINICAL DATA:  Shortness of breath. History of renal malignancy, chronic renal disease, coronary artery disease, and possible lung malignancy. EXAM: PORTABLE CHEST 1 VIEW COMPARISON:  Chest x-ray of May 17, 2018 FINDINGS: There is an azygos lobe anatomy on the right. There has been further increase in airspace opacities in the right mid and lower lung. Increased density at the left lung base in the retrocardiac region has developed as well. A trace of pleural fluid on the left is present. The heart is top-normal in size. The central pulmonary vascularity is mildly prominent. There is calcification in the wall of the thoracic aorta. IMPRESSION: Worsening of airspace opacity greatest on the right may reflect asymmetric pulmonary edema but superimposed pneumonia is not excluded. Persistent retrocardiac density which may reflect atelectasis or pneumonia. Trace left pleural effusion. When the patient can tolerate the procedure, a PA and lateral chest x-ray would be useful. Thoracic aortic atherosclerosis. Electronically Signed   By: David  Martinique M.D.   On: 05/18/2018 08:40   Dg Chest Port 1 View  Result Date: 05/17/2018 CLINICAL DATA:  Shortness of breath, coronary artery disease, stage 4 chronic kidney disease EXAM: PORTABLE CHEST 1 VIEW COMPARISON:  Portable chest x-ray of May 17, 2018 FINDINGS: The lungs are well-expanded. The interstitial markings remain increased. The cardiac silhouette is enlarged. The pulmonary vascularity is mildly engorged. There is a small left pleural effusion. There is calcification in the wall of  the thoracic aorta. There are degenerative changes of both shoulders. IMPRESSION: CHF superimposed upon chronic bronchitic changes. No alveolar pneumonia. Thoracic aortic atherosclerosis. Electronically Signed   By: David  Martinique M.D.   On: 05/17/2018 07:29   Dg Chest Port 1 View  Result Date: 05/17/2018 CLINICAL DATA:  Shortness of breath. EXAM: PORTABLE CHEST 1 VIEW COMPARISON:  Radiograph yesterday at 0616 hour FINDINGS: Unchanged cardiomegaly and mediastinal contours. There is aortic atherosclerosis. Increasing hazy opacity at the lung bases suggesting pleural effusions and atelectasis. Peribronchial cuffing suspicious for pulmonary edema. No pneumothorax. Incidental azygos fissure. IMPRESSION: Increasing hazy opacity at the lung bases likely increasing pleural effusions and atelectasis. Peribronchial cuffing suspicious for pulmonary edema with similar vascular congestion. Cardiomegaly and Aortic Atherosclerosis (ICD10-I70.0). Electronically Signed   By: Jeb Levering M.D.   On: 05/17/2018 01:40   Scheduled Meds: . allopurinol  300 mg Oral Daily  . aspirin EC  81 mg Oral Daily  . doxycycline  100 mg Oral Q12H  . furosemide  80 mg Oral BID  . guaiFENesin  1,200 mg Oral BID  . insulin aspart  0-15 Units Subcutaneous TID WC  . insulin aspart  0-5 Units Subcutaneous QHS  . ipratropium-albuterol  3 mL Nebulization TID  . levothyroxine  200 mcg Oral QAC breakfast  . mometasone-formoterol  2 puff Inhalation BID  . pantoprazole  40 mg Oral BID  . pravastatin  40 mg Oral QHS  . predniSONE  40 mg Oral Q breakfast  . sodium chloride flush  3 mL Intravenous Q12H   Continuous Infusions: . sodium chloride    . cefTRIAXone (ROCEPHIN)  IV 1 g (05/18/18 1152)    LOS: 3 days   Kerney Elbe, DO [facility] Pager [phone]  If 7PM-7AM, please contact night-coverage [email or web address] Password [email or web address] 05/18/2018, 1:49 PM

## 2018-05-18 NOTE — Progress Notes (Signed)
Occupational Therapy Treatment Patient Details Name: [name] MRN: [id number] DOB: October 29, 1929 Today's Date: 05/18/2018    History of present illness Pt is an 84 y.o. male with PMH significant for HTN, chronic venous insufficiency, HLD, pre-diabetes, hypothyroidism, history of gout, metastatic renal cell carcinoma s/p nephrectomy, osteomyelitis of R foot, and other comorbidities. He presented to the ED wtih progressive worsening of shortness of breath and dyspnea on exertion. Admitted with acute respiratory failure with hypoxia.    OT comments  Pt progressing towards OT goals this session. Pt max A for bathing at bed level (suspect Pt can do more - but he stated "I have an aide that does this for me") Pt able to come EOB for bathing and fresh sheets with mod A +2 assist. Pt also agreeable with max encouragement to HEP for BUE with theraband to work on activity tolerance, and build strength in BUE to assist with ADL and transfers. OT will continue to follow acutely prior to dc to SNF.   Follow Up Recommendations  SNF;Supervision/Assistance - 24 hour    Equipment Recommendations  3 in 1 bedside commode    Recommendations for Other Services      Precautions / Restrictions Precautions Precautions: Fall Precaution Comments: Watch O2  Restrictions Weight Bearing Restrictions: No       Mobility Bed Mobility Overal bed mobility: Needs Assistance Bed Mobility: Rolling;Supine to Sit Rolling: Mod assist;+2 for physical assistance   Supine to sit: Mod assist;+2 for physical assistance     General bed mobility comments: heavy use of bed pad to assist, Pt will pull on bed rails to assist after vc  Transfers                 General transfer comment: NT this session    Balance Overall balance assessment: Needs assistance Sitting-balance support: No upper extremity supported;Feet supported Sitting balance-Leahy Scale: Fair                                      ADL either performed or assessed with clinical judgement   ADL Overall ADL's : Needs assistance/impaired Eating/Feeding: Set up;Sitting   Grooming: Minimal assistance;Oral care;Wash/dry face Grooming Details (indicate cue type and reason): Pt provided with pre-set toothbrush, max A for shaving Upper Body Bathing: Maximal assistance;Bed level Upper Body Bathing Details (indicate cue type and reason): "I don't do this, I have an aide that does this for me" Lower Body Bathing: Total assistance;Bed level Lower Body Bathing Details (indicate cue type and reason): "I don't do this, I have an aide that does this for me" Upper Body Dressing : Minimal assistance   Lower Body Dressing: Total assistance;Sit to/from stand                 General ADL Comments: very poor activity tolerance, very self-limiting during today's session     Vision   Vision Assessment?: No apparent visual deficits   Perception     Praxis      Cognition Arousal/Alertness: Awake/alert Behavior During Therapy: Flat affect Overall Cognitive Status: Within Functional Limits for tasks assessed                                          Exercises Exercises: General Upper Extremity General Exercises - Upper Extremity Shoulder Flexion: Strengthening;Both;10  reps;Supine;Theraband Theraband Level (Shoulder Flexion): Level 2 (Red) Shoulder ABduction: Strengthening;Both;10 reps;Supine;Theraband Theraband Level (Shoulder Abduction): Level 2 (Red) Shoulder Horizontal ABduction: Strengthening;Both;10 reps;Supine;Theraband Theraband Level (Shoulder Horizontal Abduction): Level 2 (Red) Shoulder Horizontal ADduction: Strengthening;Both;10 reps;Supine;Theraband Theraband Level (Shoulder Horizontal Adduction): Level 2 (Red)   Shoulder Instructions       General Comments      Pertinent Vitals/ Pain       Pain Assessment: No/denies pain  Home Living                                           Prior Functioning/Environment              Frequency  Min 2X/week        Progress Toward Goals  OT Goals(current goals can now be found in the care plan section)  Progress towards OT goals: Progressing toward goals  Acute Rehab OT Goals Patient Stated Goal: to feel better OT Goal Formulation: With patient Time For Goal Achievement: 05/30/18 Potential to Achieve Goals: Good  Plan Discharge plan remains appropriate;Frequency remains appropriate    Co-evaluation                 AM-PAC PT "6 Clicks" Daily Activity     Outcome Measure   Help from another person eating meals?: None Help from another person taking care of personal grooming?: None Help from another person toileting, which includes using toliet, bedpan, or urinal?: A Lot Help from another person bathing (including washing, rinsing, drying)?: A Lot Help from another person to put on and taking off regular upper body clothing?: A Little Help from another person to put on and taking off regular lower body clothing?: Total 6 Click Score: 16    End of Session Equipment Utilized During Treatment: Oxygen  OT Visit Diagnosis: Other abnormalities of gait and mobility (R26.89);Muscle weakness (generalized) (M62.81)   Activity Tolerance Patient limited by fatigue   Patient Left in bed;with call bell/phone within reach;with nursing/sitter in room;with family/visitor present   Nurse Communication Mobility status        Time: 5093-2671 OT Time Calculation (min): 35 min  Charges: OT General Charges $OT Visit: 1 Visit OT Treatments $Self Care/Home Management : 23-37 mins  Hulda Humphrey OTR/L [place] 05/18/2018, 9:25 AM

## 2018-05-18 NOTE — Clinical Social Work Note (Signed)
Clinical Social Work Assessment  Patient Details  Name: [name] MRN: [id number] Date of Birth: [birth year]  Date of referral:  05/17/18               Reason for consult:  Facility Placement, Discharge Planning                Permission sought to share information with:  Other(Patient oriented to person and place only) Permission granted to share information::  No(Daughter was at the bedside)  Name::     [name]  Agency::     Relationship::  Daughter  Contact Information:  [phone]  Housing/Transportation Living arrangements for the past 2 months:  Single Family Home Source of Information:  Adult Children Patient Interpreter Needed:  None Criminal Activity/Legal Involvement Pertinent to Current Situation/Hospitalization:  No - Comment as needed Significant Relationships:  Adult Children, Other(Comment)(Daughter financee) Lives with:  Adult Children Do you feel safe going back to the place where you live?  Yes(Daughter indicated that if patient becomes medically better, her plan is for him to discharge home) Need for family participation in patient care:  Yes (Comment)  Care giving concerns:  Daughter reported that patient was independent with ADL's and ambulated with the assistance of a cane prior to admission. Ms. [name] advised that she and her finance are at home with patient and the discharge plan will be home once his medical issues are resolved.    Social Worker assessment / plan:  CSW talked with patient's daughter at the bedside. CSW greeted patient and patient responded then was quiet during the visit, eating his lunch. [name] was had with daughter, who is very involved.  Ms. [name] reported that she and her finance live with her dad and he was independent prior to admission to hospital.She reported that he has been to rehab several times, however she indicated that if he is medically better at discharge he will go home with [facility] services. Daughter was  provided with SNF list and facility responses.  Employment status:  Retired Research officer, political party) PT Recommendations:  Skilled Nursing Facility(SNF or 24 hr. supervision/assistance) Information / Referral to community resources:  Skilled Nursing Facility(Daughter provided with SNF list)  Patient/Family's Response to care:  Daughter expressed no concerns regarding patient's care during hospitalization appeared aware of why patient required hospitalization and of his current medical issues.   Patient/Family's Understanding of and Emotional Response to Diagnosis, Current Treatment, and Prognosis:  Ms. [name] is open to SNF if she feels she and her finance cannot care for him at home. Daughter agreeable to CSW checking in with her prior to discharge to determine her decision on her dad's discharge disposition.     Emotional Assessment Appearance:  Appears stated age Attitude/Demeanor/Rapport:  Other(Greeted CSW, then was quiet during conversation) Affect (typically observed):  Quiet Orientation:  Oriented to Self, Oriented to Place Alcohol / Substance use:  Tobacco Use, Alcohol Use, Illicit Drugs(Patient reported that he quit smoking, drinks 6.6 ounces of alcohol per week and does not use illicit drugs) Psych involvement (Current and /or in the community):  No (Comment)  Discharge Needs  Concerns to be addressed:  Discharge Planning Concerns Readmission within the last 30 days:  No Current discharge risk:  None Barriers to Discharge:  Continued Medical Work up   [employer] [name], [place] 05/18/2018, 1:05 PM

## 2018-05-19 ENCOUNTER — Inpatient Hospital Stay (HOSPITAL_COMMUNITY): Payer: Medicare Other

## 2018-05-19 DIAGNOSIS — J440 Chronic obstructive pulmonary disease with acute lower respiratory infection: Secondary | ICD-10-CM

## 2018-05-19 DIAGNOSIS — J209 Acute bronchitis, unspecified: Secondary | ICD-10-CM

## 2018-05-19 LAB — CBC WITH DIFFERENTIAL/PLATELET
ABS IMMATURE GRANULOCYTES: 0.2 10*3/uL — AB (ref 0.0–0.1)
BASOS PCT: 0 %
Basophils Absolute: 0 10*3/uL (ref 0.0–0.1)
Eosinophils Absolute: 0 10*3/uL (ref 0.0–0.7)
Eosinophils Relative: 0 %
HCT: 28.8 % — ABNORMAL LOW (ref 39.0–52.0)
HEMOGLOBIN: 9.1 g/dL — AB (ref 13.0–17.0)
IMMATURE GRANULOCYTES: 2 %
LYMPHS ABS: 1 10*3/uL (ref 0.7–4.0)
LYMPHS PCT: 10 %
MCH: 31 pg (ref 26.0–34.0)
MCHC: 31.6 g/dL (ref 30.0–36.0)
MCV: 98 fL (ref 78.0–100.0)
MONO ABS: 0.7 10*3/uL (ref 0.1–1.0)
MONOS PCT: 7 %
NEUTROS PCT: 81 %
Neutro Abs: 8.3 10*3/uL — ABNORMAL HIGH (ref 1.7–7.7)
Platelets: 240 10*3/uL (ref 150–400)
RBC: 2.94 MIL/uL — ABNORMAL LOW (ref 4.22–5.81)
RDW: 14.8 % (ref 11.5–15.5)
WBC: 10.2 10*3/uL (ref 4.0–10.5)

## 2018-05-19 LAB — COMPREHENSIVE METABOLIC PANEL
ALK PHOS: 72 U/L (ref 38–126)
ALT: 46 U/L — AB (ref 0–44)
AST: 44 U/L — AB (ref 15–41)
Albumin: 3.1 g/dL — ABNORMAL LOW (ref 3.5–5.0)
Anion gap: 13 (ref 5–15)
BUN: 75 mg/dL — AB (ref 8–23)
CALCIUM: 8.4 mg/dL — AB (ref 8.9–10.3)
CO2: 25 mmol/L (ref 22–32)
CREATININE: 2.65 mg/dL — AB (ref 0.61–1.24)
Chloride: 103 mmol/L (ref 98–111)
GFR calc Af Amer: 23 mL/min — ABNORMAL LOW (ref >60)
GFR, EST NON AFRICAN AMERICAN: 20 mL/min — AB (ref >60)
Glucose, Bld: 132 mg/dL — ABNORMAL HIGH (ref 70–99)
Potassium: 4.3 mmol/L (ref 3.5–5.1)
Sodium: 141 mmol/L (ref 135–145)
Total Bilirubin: 0.8 mg/dL (ref 0.3–1.2)
Total Protein: 6.5 g/dL (ref 6.5–8.1)

## 2018-05-19 LAB — GLUCOSE, CAPILLARY
GLUCOSE-CAPILLARY: 143 mg/dL — AB (ref 70–99)
GLUCOSE-CAPILLARY: 151 mg/dL — AB (ref 70–99)
Glucose-Capillary: 107 mg/dL — ABNORMAL HIGH (ref 70–99)
Glucose-Capillary: 149 mg/dL — ABNORMAL HIGH (ref 70–99)

## 2018-05-19 LAB — MAGNESIUM: Magnesium: 2.4 mg/dL (ref 1.7–2.4)

## 2018-05-19 LAB — PHOSPHORUS: Phosphorus: 4.9 mg/dL — ABNORMAL HIGH (ref 2.5–4.6)

## 2018-05-19 MED ORDER — IPRATROPIUM-ALBUTEROL 0.5-2.5 (3) MG/3ML IN SOLN
3.0000 mL | Freq: Two times a day (BID) | RESPIRATORY_TRACT | Status: DC
  Administered 2018-05-19 – 2018-05-24 (×9): 3 mL via RESPIRATORY_TRACT
  Filled 2018-05-19 (×9): qty 3

## 2018-05-19 MED ORDER — HYDROCOD POLST-CPM POLST ER 10-8 MG/5ML PO SUER
5.0000 mL | Freq: Once | ORAL | Status: AC
  Administered 2018-05-19: 5 mL via ORAL
  Filled 2018-05-19: qty 5

## 2018-05-19 MED ORDER — PREDNISONE 20 MG PO TABS
40.0000 mg | ORAL_TABLET | Freq: Every day | ORAL | Status: AC
  Administered 2018-05-20 – 2018-05-21 (×2): 40 mg via ORAL
  Filled 2018-05-19 (×3): qty 2

## 2018-05-19 MED ORDER — PANTOPRAZOLE SODIUM 40 MG PO TBEC
40.0000 mg | DELAYED_RELEASE_TABLET | Freq: Two times a day (BID) | ORAL | Status: DC
  Administered 2018-05-19 – 2018-05-24 (×10): 40 mg via ORAL
  Filled 2018-05-19 (×10): qty 1

## 2018-05-19 MED ORDER — INSULIN ASPART 100 UNIT/ML ~~LOC~~ SOLN: 10.0000 [IU] | Freq: Once | SUBCUTANEOUS | Status: DC

## 2018-05-19 MED ORDER — IPRATROPIUM-ALBUTEROL 0.5-2.5 (3) MG/3ML IN SOLN: 3.0000 mL | Freq: Four times a day (QID) | RESPIRATORY_TRACT | Status: DC

## 2018-05-19 NOTE — Progress Notes (Signed)
PROGRESS NOTE    [name]  ZOX:[id number] DOB: 12-01-1928 DOA: 05/14/2018 PCP: Unk Pinto, MD   Brief Narrative:  84 year old with a history of essential hypertension, chronic venous insufficiency, hyperlipidemia, prediabetes, hypothyroidism, gout, metastatic renal carcinoma status post nephrectomy, osteomyelitis of the right foot who came to the hospital with progressive worsening of the shortness of breath and dyspnea.  He has had bilateral chronic lower extremity edema for which she has been taking Bumex per his cardiologist.  Upon admission he was found to be likely be in fluid overload therefore started on IV diuresis but his creatinine started trending up despite of signs of fluid overload therefore case was discussed by the previous provider, Dr Alfredia Ferguson with Dr Jimmy Footman from Nephro who recommended continuing diuresis hold her Lasix.  Chest x-ray suggested possible early pneumonia with flare of COPD therefore started on steroids and breathing treatments as well.  He is also been on IV Rocephin and doxycycline.   Assessment & Plan:   Active Problems:   Cancer of kidney (HCC)   GERD (gastroesophageal reflux disease)   CKD, stage IV (GFR 28 ml/min) (HCC)   Hypothyroidism   Hyperlipidemia   CAD (coronary artery disease), native coronary artery   Essential hypertension   Anemia   Acute respiratory failure (HCC)   Occult blood in stools   Fluid overload   Reactive airway disease   Acute respiratory failure with hypoxia, multifactorial Fluid overload, improving Acute exacerbation of COPD with underlying acute bronchitis versus community-acquired pneumonia - Echocardiogram obtained-limited but overall appears to have normal biventricular systolic and diastolic function - Continue oral prednisone for 3 more days, continue Rocephin and doxycycline.  Continue supplemental oxygen, currently on 3 L nasal cannula. - We will change nebulizer treatments from 3 times daily to 4 times  daily, PRN bronchodilators.  Continue Dulera - Chest x-ray this morning does not show much improvement -BMP tomorrow morning - Lasix 80 mg orally twice daily.  Case discussed by Dr Alfredia Ferguson with Dr Deterding from nephro who recommends oral Lasix.  -Continue with incentive spirometry and flutter valve.  Acute kidney injury on CKD stage IV - Currently watching his renal function closely.  His creatinine is down to 2.6 with p.o. diuresis.  Continue the X.  Avoid nephrotoxic drugs.  Caution with diuresis given his history of nephrectomy  Coronary artery disease -Patient is currently chest pain-free.  Continue statin.  Apparently patient's aspirin was stopped due to concerns of positive Hemoccult.  No more signs of blood loss, therefore was resumed.  History of renal cell carcinoma status post metastasis to lung status post nephrectomy - Stable at this point.  Follow-up outpatient  Essential hypertension - Continue Lasix twice daily.  GERD -Continue PPI twice daily twice daily before meals.  Hyperlipidemia -Continue pravastatin 40 mg daily at bedtime  Hemoccult-positive blood at the time of admission -Patient refused further GI work-up.  He does have history of hemorrhoids.  We will continue to monitor hemoglobin which remained stable at this point.  Diabetes mellitus type 2 - Continue sliding scale  Hypothyroidism -Continue Synthroid 200 mcg daily  History of gout -Continue allopurinol 300 mg daily.  DVT prophylaxis: SCDs Code Status: DNR Family Communication: Family friend at bedside Disposition Plan: Maintain inpatient stay until his breathing improves and is able to be weaned off nasal cannula oxygen.  Consultants:   None  Procedures:   None  Antimicrobials:   Rocephin  Doxycycline   Subjective: Patient does not have any new complaints  this morning.  He still has exertional shortness of breath and bringing up somewhat of thick mucus with coughing.  Review of  Systems Otherwise negative except as per HPI, including: General: Denies fever, chills, night sweats or unintended weight loss. Resp: Denies  wheezing Cardiac: Denies chest pain, palpitations, orthopnea, paroxysmal nocturnal dyspnea. GI: Denies abdominal pain, nausea, vomiting, diarrhea or constipation GU: Denies dysuria, frequency, hesitancy or incontinence MS: Denies muscle aches, joint pain or swelling Neuro: Denies headache, neurologic deficits (focal weakness, numbness, tingling), abnormal gait Psych: Denies anxiety, depression, SI/HI/AVH Skin: Denies new rashes or lesions ID: Denies sick contacts, exotic exposures, travel  Objective: Vitals:   05/19/18 0529 05/19/18 0733 05/19/18 0912 05/19/18 0913  BP: (!) 150/70 (!) 162/85    Pulse: 92 91    Resp:  18    Temp:  97.7 F (36.5 C)    TempSrc:  Oral    SpO2:  92% 93% 93%  Weight:      Height:        Intake/Output Summary (Last 24 hours) at 05/19/2018 0944 Last data filed at 05/19/2018 0900 Gross per 24 hour  Intake 600 ml  Output 3050 ml  Net -2450 ml   Filed Weights   05/15/18 2053 05/16/18 2050 05/18/18 2117  Weight: 122.9 kg (270 lb 15.1 oz) 123.1 kg (271 lb 6.2 oz) 122.8 kg (270 lb 11.6 oz)    Examination:  General exam: Appears calm and comfortable, currently on 3 L nasal cannula Respiratory system:  Cardiovascular system: S1 & S2 heard, RRR. No JVD, murmurs, rubs, gallops or clicks.  Gastrointestinal system: Abdomen is nondistended, soft and nontender. No organomegaly or masses felt. Normal bowel sounds heard. Central nervous system: Alert and oriented. No focal neurological deficits. Extremities: Symmetric 4 x 5 power. Skin: No rashes, lesions or ulcers Psychiatry: Judgement and insight appear normal. Mood & affect appropriate.     Data Reviewed:   CBC: Recent Labs  Lab 05/15/18 0542 05/16/18 0707 05/17/18 0635 05/18/18 0414 05/19/18 0459  WBC 9.0 10.8* 12.6* 10.9* 10.2  NEUTROABS  --  9.4*  10.0* 8.9* 8.3*  HGB 9.3* 8.0* 8.3* 8.7* 9.1*  HCT 29.3* 25.1* 26.4* 27.5* 28.8*  MCV 99.0 98.4 98.5 99.3 98.0  PLT 251 215 215 236 469   Basic Metabolic Panel: Recent Labs  Lab 05/15/18 0542 05/16/18 0517 05/17/18 0635 05/18/18 0414 05/19/18 0459  NA 140 139 141 140 141  K 4.4 4.9 3.7 4.1 4.3  CL 107 108 108 105 103  CO2 24 20* 25 24 25   GLUCOSE 154* 131* 118* 124* 132*  BUN 46* 61* 71* 73* 75*  CREATININE 2.12* 2.72* 2.82* 2.77* 2.65*  CALCIUM 8.6* 8.0* 8.2* 8.1* 8.4*  MG 2.3 2.4 2.6* 2.5* 2.4  PHOS 4.3 5.2* 5.0* 5.3* 4.9*   GFR: Estimated Creatinine Clearance: 25.4 mL/min (A) (by C-G formula based on SCr of 2.65 mg/dL (H)). Liver Function Tests: Recent Labs  Lab 05/15/18 0542 05/16/18 0517 05/17/18 0635 05/18/18 0414 05/19/18 0459  AST 19 21 19 31  44*  ALT 18 15 18 27  46*  ALKPHOS 80 61 61 69 72  BILITOT 0.7 0.7 0.5 0.7 0.8  PROT 6.9 5.9* 6.2* 6.4* 6.5  ALBUMIN 2.9* 2.7* 2.9* 2.9* 3.1*   No results for input(s): LIPASE, AMYLASE in the last 168 hours. No results for input(s): AMMONIA in the last 168 hours. Coagulation Profile: No results for input(s): INR, PROTIME in the last 168 hours. Cardiac Enzymes: Recent Labs  Lab 05/15/18  9323 05/15/18 0542 05/15/18 1144  TROPONINI 0.03* 0.03* 0.03*   BNP (last 3 results) No results for input(s): PROBNP in the last 8760 hours. HbA1C: No results for input(s): HGBA1C in the last 72 hours. CBG: Recent Labs  Lab 05/18/18 0812 05/18/18 1132 05/18/18 1645 05/18/18 2121 05/19/18 0731  GLUCAP 107* 111* 192* 155* 107*   Lipid Profile: No results for input(s): CHOL, HDL, LDLCALC, TRIG, CHOLHDL, LDLDIRECT in the last 72 hours. Thyroid Function Tests: No results for input(s): TSH, T4TOTAL, FREET4, T3FREE, THYROIDAB in the last 72 hours. Anemia Panel: No results for input(s): VITAMINB12, FOLATE, FERRITIN, TIBC, IRON, RETICCTPCT in the last 72 hours. Sepsis Labs: No results for input(s): PROCALCITON, LATICACIDVEN  in the last 168 hours.  Recent Results (from the past 240 hour(s))  MRSA PCR Screening     Status: None   Collection Time: 05/15/18  6:44 AM  Result Value Ref Range Status   MRSA by PCR NEGATIVE NEGATIVE Final    Comment:        The GeneXpert MRSA Assay (FDA approved for NASAL specimens only), is one component of a comprehensive MRSA colonization surveillance program. It is not intended to diagnose MRSA infection nor to guide or monitor treatment for MRSA infections. Performed at [facility], [place] [address], [place], [place] [address]          Radiology Studies: Dg Chest 2 View  Result Date: 05/19/2018 CLINICAL DATA:  85 year old male with a history of shortness of breath EXAM: CHEST - 2 VIEW COMPARISON:  05/18/2018 FINDINGS: In cardiomediastinal silhouette unchanged, partially obscured by overlying lung and pleural disease. Persistently low lung volumes with right greater than left interstitial and airspace opacities. Blunting of the costophrenic angles with opacification of the sulcus on the lateral view. Surgical changes of the cervical region. IMPRESSION: Low lung volumes persist, with unchanged appearance of bilateral, right greater than left mixed interstitial and airspace opacities and associated pleural effusions. Electronically Signed   By: Corrie Mckusick D.O.   On: 05/19/2018 07:28   Dg Chest Port 1 View  Result Date: 05/18/2018 CLINICAL DATA:  Shortness of breath. History of renal malignancy, chronic renal disease, coronary artery disease, and possible lung malignancy. EXAM: PORTABLE CHEST 1 VIEW COMPARISON:  Chest x-ray of May 17, 2018 FINDINGS: There is an azygos lobe anatomy on the right. There has been further increase in airspace opacities in the right mid and lower lung. Increased density at the left lung base in the retrocardiac region has developed as well. A trace of pleural fluid on the left is present. The heart is top-normal in size. The central pulmonary  vascularity is mildly prominent. There is calcification in the wall of the thoracic aorta. IMPRESSION: Worsening of airspace opacity greatest on the right may reflect asymmetric pulmonary edema but superimposed pneumonia is not excluded. Persistent retrocardiac density which may reflect atelectasis or pneumonia. Trace left pleural effusion. When the patient can tolerate the procedure, a PA and lateral chest x-ray would be useful. Thoracic aortic atherosclerosis. Electronically Signed   By: David  Martinique M.D.   On: 05/18/2018 08:40        Scheduled Meds: . allopurinol  300 mg Oral Daily  . aspirin EC  81 mg Oral Daily  . doxycycline  100 mg Oral Q12H  . feeding supplement (GLUCERNA SHAKE)  237 mL Oral Daily  . feeding supplement (PRO-STAT SUGAR FREE 64)  30 mL Oral BID  . furosemide  80 mg Oral BID  .  guaiFENesin  1,200 mg Oral BID  . insulin aspart  0-15 Units Subcutaneous TID WC  . insulin aspart  0-5 Units Subcutaneous QHS  . ipratropium-albuterol  3 mL Nebulization QID  . levothyroxine  200 mcg Oral QAC breakfast  . mometasone-formoterol  2 puff Inhalation BID  . pantoprazole  40 mg Oral BID  . pravastatin  40 mg Oral QHS  . predniSONE  40 mg Oral Q breakfast  . sodium chloride flush  3 mL Intravenous Q12H  . sodium chloride HYPERTONIC  4 mL Nebulization Daily   Continuous Infusions: . sodium chloride    . cefTRIAXone (ROCEPHIN)  IV 1 g (05/18/18 1152)     LOS: 4 days    I have spent 35 minutes face to face with the patient and on the ward discussing the patients care, assessment, plan and disposition with other care givers. >50% of the time was devoted counseling the patient about the risks and benefits of treatment and coordinating care.     Ankit Arsenio Loader, MD [facility] Pager [phone]   If 7PM-7AM, please contact night-coverage [email or web address] Password [facility] 05/19/2018, 9:44 AM

## 2018-05-19 NOTE — Progress Notes (Signed)
Physical Therapy Treatment Patient Details Name: [name] MRN: [id number] DOB: January 09, 1929 Today's Date: 05/19/2018    History of Present Illness Pt is an 84 y.o. male with PMH significant for HTN, chronic venous insufficiency, HLD, pre-diabetes, hypothyroidism, history of gout, metastatic renal cell carcinoma s/p nephrectomy, osteomyelitis of R foot, and other comorbidities. He presented to the ED wtih progressive worsening of shortness of breath and dyspnea on exertion. Admitted with acute respiratory failure with hypoxia.     PT Comments    Patient maintaining functional mobility level. Progressing bed to chair via stand pivot and walker with up to two person moderate assistance. Decrease in SpO2 to 87% on 3L O2 with mobility but rebounded back up to 90% SpO2 with seated rest break. Rest of session focused on lower extremity strengthening. D/c plans remains appropriate, however, noted that patient likely going home so increased frequency.    Follow Up Recommendations  SNF;Supervision/Assistance - 24 hour     Equipment Recommendations  None recommended by PT    Recommendations for Other Services       Precautions / Restrictions Precautions Precautions: Fall Precaution Comments: Watch O2  Restrictions Weight Bearing Restrictions: No    Mobility  Bed Mobility Overal bed mobility: Needs Assistance Bed Mobility: Supine to Sit     Supine to sit: Mod assist     General bed mobility comments: Mod assist for elevating trunk and use of bed pad to pull left hip forward to edge of bed. Significant effort and time required  Transfers Overall transfer level: Needs assistance Equipment used: Rolling walker (2 wheeled) Transfers: Stand Pivot Transfers;Sit to/from Stand Sit to Stand: Mod assist;+2 physical assistance Stand pivot transfers: Min assist;+2 physical assistance       General transfer comment: Patient continuing to lean forearms on walker and is unable to achieve  upright positioning, however, able to perform with min assist.  Ambulation/Gait                 Stairs             Wheelchair Mobility    Modified Rankin (Stroke Patients Only)       Balance Overall balance assessment: Needs assistance Sitting-balance support: No upper extremity supported;Feet supported Sitting balance-Leahy Scale: Fair                                      Cognition Arousal/Alertness: Awake/alert Behavior During Therapy: Flat affect Overall Cognitive Status: Within Functional Limits for tasks assessed                                        Exercises General Exercises - Lower Extremity Ankle Circles/Pumps: 10 reps;Both;Supine Heel Slides: 5 reps;Both;Supine Hip ABduction/ADduction: 5 reps;Both;Supine Straight Leg Raises: 5 reps;Both;Supine    General Comments        Pertinent Vitals/Pain Pain Assessment: No/denies pain    Home Living                      Prior Function            PT Goals (current goals can now be found in the care plan section) Acute Rehab PT Goals Patient Stated Goal: to feel better Potential to Achieve Goals: Fair    Frequency    Min 3X/week  PT Plan Frequency needs to be updated    Co-evaluation              AM-PAC PT "6 Clicks" Daily Activity  Outcome Measure  Difficulty turning over in bed (including adjusting bedclothes, sheets and blankets)?: A Lot Difficulty moving from lying on back to sitting on the side of the bed? : Unable Difficulty sitting down on and standing up from a chair with arms (e.g., wheelchair, bedside commode, etc,.)?: Unable Help needed moving to and from a bed to chair (including a wheelchair)?: A Little Help needed walking in hospital room?: A Lot Help needed climbing 3-5 steps with a railing? : Total 6 Click Score: 10    End of Session Equipment Utilized During Treatment: Gait belt;Oxygen Activity Tolerance: Patient  tolerated treatment well Patient left: in chair;with call bell/phone within reach;with chair alarm set Nurse Communication: Mobility status PT Visit Diagnosis: Unsteadiness on feet (R26.81);Muscle weakness (generalized) (M62.81);Difficulty in walking, not elsewhere classified (R26.2)     Time: 3143-8887 PT Time Calculation (min) (ACUTE ONLY): 20 min  Charges:  $Therapeutic Activity: 8-22 mins                    G Codes:      Ellamae Sia, PT, DPT Acute Rehabilitation Services  Pager: [phone]  Willy Eddy 05/19/2018, 1:39 PM

## 2018-05-19 NOTE — Progress Notes (Signed)
Patient had 7 beat V tach. VSS, patient denies any chest pain except for rib pain when coughing. MD on call text paged. Willcontinue to monitor. Zuha Dejonge, Wonda Cheng, Therapist, sports

## 2018-05-20 LAB — GLUCOSE, CAPILLARY
GLUCOSE-CAPILLARY: 179 mg/dL — AB (ref 70–99)
Glucose-Capillary: 100 mg/dL — ABNORMAL HIGH (ref 70–99)
Glucose-Capillary: 101 mg/dL — ABNORMAL HIGH (ref 70–99)
Glucose-Capillary: 183 mg/dL — ABNORMAL HIGH (ref 70–99)

## 2018-05-20 LAB — BASIC METABOLIC PANEL
Anion gap: 14 (ref 5–15)
BUN: 83 mg/dL — AB (ref 8–23)
CALCIUM: 8.4 mg/dL — AB (ref 8.9–10.3)
CO2: 26 mmol/L (ref 22–32)
CREATININE: 2.71 mg/dL — AB (ref 0.61–1.24)
Chloride: 103 mmol/L (ref 98–111)
GFR calc Af Amer: 23 mL/min — ABNORMAL LOW (ref >60)
GFR calc non Af Amer: 20 mL/min — ABNORMAL LOW (ref >60)
Glucose, Bld: 115 mg/dL — ABNORMAL HIGH (ref 70–99)
Potassium: 4.3 mmol/L (ref 3.5–5.1)
SODIUM: 143 mmol/L (ref 135–145)

## 2018-05-20 LAB — CBC
HCT: 28.3 % — ABNORMAL LOW (ref 39.0–52.0)
Hemoglobin: 9 g/dL — ABNORMAL LOW (ref 13.0–17.0)
MCH: 31.3 pg (ref 26.0–34.0)
MCHC: 31.8 g/dL (ref 30.0–36.0)
MCV: 98.3 fL (ref 78.0–100.0)
Platelets: 240 10*3/uL (ref 150–400)
RBC: 2.88 MIL/uL — ABNORMAL LOW (ref 4.22–5.81)
RDW: 14.7 % (ref 11.5–15.5)
WBC: 9.7 10*3/uL (ref 4.0–10.5)

## 2018-05-20 LAB — MAGNESIUM: Magnesium: 2.2 mg/dL (ref 1.7–2.4)

## 2018-05-20 LAB — BRAIN NATRIURETIC PEPTIDE: B NATRIURETIC PEPTIDE 5: 689 pg/mL — AB (ref 0.0–100.0)

## 2018-05-20 NOTE — Progress Notes (Signed)
PROGRESS NOTE    [name]  VOZ:[id number] DOB: 1929-09-07 DOA: 05/14/2018 PCP: Unk Pinto, MD   Brief Narrative:  84 year old with a history of essential hypertension, chronic venous insufficiency, hyperlipidemia, prediabetes, hypothyroidism, gout, metastatic renal carcinoma status post nephrectomy, osteomyelitis of the right foot who came to the hospital with progressive worsening of the shortness of breath and dyspnea.  He has had bilateral chronic lower extremity edema for which she has been taking Bumex per his cardiologist.  Upon admission he was found to be likely be in fluid overload therefore started on IV diuresis but his creatinine started trending up despite of signs of fluid overload therefore case was discussed by the previous provider, Dr Alfredia Ferguson with Dr Jimmy Footman from [facility] who recommended continuing diuresis hold her Lasix.  Chest x-ray suggested possible early pneumonia with flare of COPD therefore started on steroids and breathing treatments as well.  He is also been on IV Rocephin and doxycycline.   Assessment & Plan:   Active Problems:   Cancer of kidney (HCC)   GERD (gastroesophageal reflux disease)   CKD, stage IV (GFR 28 ml/min) (HCC)   Hypothyroidism   Hyperlipidemia   CAD (coronary artery disease), native coronary artery   Essential hypertension   Anemia   Acute respiratory failure (HCC)   Occult blood in stools   Fluid overload   Reactive airway disease   Acute respiratory failure with hypoxia, multifactorial Fluid overload, improving Acute COPD exacerbation with underlying acute bronchitis - Echocardiogram limited but appears to have normal systolic and diastolic function -Continue 2 more days of oral prednisone -Continue doxycycline and Rocephin - Check procalcitonin tomorrow - Continue nebulizer treatments scheduled and as necessary -Continue Dulera -Continue incentive spirometry and flutter valve.  Supplemental oxygen and wean off as  necessary -Case was discussed by Dr. Alfredia Ferguson with Dr. Jimmy Footman who recommended continuing oral Lasix.  Patient is tolerating this well.  Creatinine appears to be stable at this time but will closely monitor his BUN.  Acute kidney injury on CKD stage IV - Patient's BUN is slightly rising.  Creatinine is stable around 2.6.  Closely monitor him  Coronary artery disease -Patient is currently chest pain-free.  Continue statin.  Apparently patient's aspirin was stopped due to concerns of positive Hemoccult.  No more signs of blood loss, therefore was resumed.  History of renal cell carcinoma status post metastasis to lung status post nephrectomy - Stable at this point.  Follow-up outpatient  Essential hypertension - Continue Lasix twice daily.  GERD -Continue PPI twice daily twice daily before meals.  Hyperlipidemia -Continue pravastatin 40 mg daily at bedtime  Hemoccult-positive blood at the time of admission -Patient refused further GI work-up.  He does have history of hemorrhoids.  We will continue to monitor hemoglobin which remained stable at this point.  Diabetes mellitus type 2 - Continue sliding scale  Hypothyroidism -Continue Synthroid 200 mcg daily  History of gout -Continue allopurinol 300 mg daily.  DVT prophylaxis: SCDs Code Status: DNR Family Communication: None at bedside this morning Disposition Plan: Maintain inpatient stay.  Consultants:   None  Procedures:   None  Antimicrobials:   Rocephin  Doxycycline   Subjective: Feels a little better this morning.  He is able to use incentive spirometry and flutter valve without any issues.  Brief run of V. tach on telemetry overnight but asymptomatic.  Review of Systems Otherwise negative except as per HPI, including: General = no fevers, chills, dizziness, malaise, fatigue HEENT/EYES = negative for  pain, redness, loss of vision, double vision, blurred vision, loss of hearing, sore throat, hoarseness,  dysphagia Cardiovascular= negative for chest pain, palpitation, murmurs, lower extremity swelling Respiratory/lungs= negative for shortness of breath, cough, hemoptysis, wheezing, mucus production Gastrointestinal= negative for nausea, vomiting,, abdominal pain, melena, hematemesis Genitourinary= negative for Dysuria, Hematuria, Change in Urinary Frequency MSK = Negative for arthralgia, myalgias, Back Pain, Joint swelling  Neurology= Negative for headache, seizures, numbness, tingling  Psychiatry= Negative for anxiety, depression, suicidal and homocidal ideation Allergy/Immunology= Medication/Food allergy as listed  Skin= Negative for Rash, lesions, ulcers, itching   Objective: Vitals:   05/20/18 0658 05/20/18 0804 05/20/18 0806 05/20/18 0936  BP: (!) 159/69   (!) 131/59  Pulse: 89 85  78  Resp:  17  16  Temp:    97.8 F (36.6 C)  TempSrc:    Oral  SpO2:  91% 92% 99%  Weight:      Height:        Intake/Output Summary (Last 24 hours) at 05/20/2018 0951 Last data filed at 05/20/2018 0928 Gross per 24 hour  Intake 1080.13 ml  Output 3200 ml  Net -2119.87 ml   Filed Weights   05/16/18 2050 05/18/18 2117 05/19/18 2107  Weight: 123.1 kg (271 lb 6.2 oz) 122.8 kg (270 lb 11.6 oz) 121.6 kg (268 lb 1.3 oz)    Examination:  Constitutional: NAD, calm, comfortable, currently on 3 L nasal cannula.  Hard of hearing. Eyes: PERRL, lids and conjunctivae normal ENMT: Mucous membranes are moist. Posterior pharynx clear of any exudate or lesions.Normal dentition.  Neck: normal, supple, no masses, no thyromegaly Respiratory: Mild diffuse coarse breath sounds, improving. Cardiovascular: Regular rate and rhythm, no murmurs / rubs / gallops. No extremity edema. 2+ pedal pulses. No carotid bruits.  Abdomen: no tenderness, no masses palpated. No hepatosplenomegaly. Bowel sounds positive.  Musculoskeletal: no clubbing / cyanosis. No joint deformity upper and lower extremities. Good ROM, no  contractures. Normal muscle tone.  Skin: no rashes, lesions, ulcers. No induration Neurologic: CN 2-12 grossly intact. Sensation intact, DTR normal. Strength 5/5 in all 4.  Psychiatric: Normal judgment and insight. Alert and oriented x 3. Normal mood.   Data Reviewed:   CBC: Recent Labs  Lab 05/16/18 0707 05/17/18 0635 05/18/18 0414 05/19/18 0459 05/20/18 0538  WBC 10.8* 12.6* 10.9* 10.2 9.7  NEUTROABS 9.4* 10.0* 8.9* 8.3*  --   HGB 8.0* 8.3* 8.7* 9.1* 9.0*  HCT 25.1* 26.4* 27.5* 28.8* 28.3*  MCV 98.4 98.5 99.3 98.0 98.3  PLT 215 215 236 240 683   Basic Metabolic Panel: Recent Labs  Lab 05/15/18 0542 05/16/18 0517 05/17/18 0635 05/18/18 0414 05/19/18 0459 05/20/18 0538  NA 140 139 141 140 141 143  K 4.4 4.9 3.7 4.1 4.3 4.3  CL 107 108 108 105 103 103  CO2 24 20* 25 24 25 26   GLUCOSE 154* 131* 118* 124* 132* 115*  BUN 46* 61* 71* 73* 75* 83*  CREATININE 2.12* 2.72* 2.82* 2.77* 2.65* 2.71*  CALCIUM 8.6* 8.0* 8.2* 8.1* 8.4* 8.4*  MG 2.3 2.4 2.6* 2.5* 2.4 2.2  PHOS 4.3 5.2* 5.0* 5.3* 4.9*  --    GFR: Estimated Creatinine Clearance: 24.7 mL/min (A) (by C-G formula based on SCr of 2.71 mg/dL (H)). Liver Function Tests: Recent Labs  Lab 05/15/18 0542 05/16/18 0517 05/17/18 0635 05/18/18 0414 05/19/18 0459  AST 19 21 19 31  44*  ALT 18 15 18 27  46*  ALKPHOS 80 61 61 69 72  BILITOT 0.7 0.7  0.5 0.7 0.8  PROT 6.9 5.9* 6.2* 6.4* 6.5  ALBUMIN 2.9* 2.7* 2.9* 2.9* 3.1*   No results for input(s): LIPASE, AMYLASE in the last 168 hours. No results for input(s): AMMONIA in the last 168 hours. Coagulation Profile: No results for input(s): INR, PROTIME in the last 168 hours. Cardiac Enzymes: Recent Labs  Lab 05/15/18 0029 05/15/18 0542 05/15/18 1144  TROPONINI 0.03* 0.03* 0.03*   BNP (last 3 results) No results for input(s): PROBNP in the last 8760 hours. HbA1C: No results for input(s): HGBA1C in the last 72 hours. CBG: Recent Labs  Lab 05/19/18 0731  05/19/18 1159 05/19/18 1632 05/19/18 2100 05/20/18 0750  GLUCAP 107* 149* 143* 151* 100*   Lipid Profile: No results for input(s): CHOL, HDL, LDLCALC, TRIG, CHOLHDL, LDLDIRECT in the last 72 hours. Thyroid Function Tests: No results for input(s): TSH, T4TOTAL, FREET4, T3FREE, THYROIDAB in the last 72 hours. Anemia Panel: No results for input(s): VITAMINB12, FOLATE, FERRITIN, TIBC, IRON, RETICCTPCT in the last 72 hours. Sepsis Labs: No results for input(s): PROCALCITON, LATICACIDVEN in the last 168 hours.  Recent Results (from the past 240 hour(s))  MRSA PCR Screening     Status: None   Collection Time: 05/15/18  6:44 AM  Result Value Ref Range Status   MRSA by PCR NEGATIVE NEGATIVE Final    Comment:        The GeneXpert MRSA Assay (FDA approved for NASAL specimens only), is one component of a comprehensive MRSA colonization surveillance program. It is not intended to diagnose MRSA infection nor to guide or monitor treatment for MRSA infections. Performed at [facility], [place] [address] [place], [place] [address]          Radiology Studies: Dg Chest 2 View  Result Date: 05/19/2018 CLINICAL DATA:  84 year old male with a history of shortness of breath EXAM: CHEST - 2 VIEW COMPARISON:  05/18/2018 FINDINGS: In cardiomediastinal silhouette unchanged, partially obscured by overlying lung and pleural disease. Persistently low lung volumes with right greater than left interstitial and airspace opacities. Blunting of the costophrenic angles with opacification of the sulcus on the lateral view. Surgical changes of the cervical region. IMPRESSION: Low lung volumes persist, with unchanged appearance of bilateral, right greater than left mixed interstitial and airspace opacities and associated pleural effusions. Electronically Signed   By: Corrie Mckusick D.O.   On: 05/19/2018 07:28        Scheduled Meds: . allopurinol  300 mg Oral Daily  . aspirin EC  81 mg Oral Daily  .  feeding supplement (GLUCERNA SHAKE)  237 mL Oral Daily  . feeding supplement (PRO-STAT SUGAR FREE 64)  30 mL Oral BID  . furosemide  80 mg Oral BID  . guaiFENesin  1,200 mg Oral BID  . insulin aspart  0-15 Units Subcutaneous TID WC  . insulin aspart  0-5 Units Subcutaneous QHS  . ipratropium-albuterol  3 mL Nebulization BID  . levothyroxine  200 mcg Oral QAC breakfast  . mometasone-formoterol  2 puff Inhalation BID  . pantoprazole  40 mg Oral BID AC  . pravastatin  40 mg Oral QHS  . predniSONE  40 mg Oral Q breakfast  . sodium chloride flush  3 mL Intravenous Q12H   Continuous Infusions: . sodium chloride    . cefTRIAXone (ROCEPHIN)  IV 1 g (05/19/18 1057)     LOS: 5 days    I have spent 30 minutes face to face with the patient and on the ward discussing  the patients care, assessment, plan and disposition with other care givers. >50% of the time was devoted counseling the patient about the risks and benefits of treatment and coordinating care.     Aryah Doering Arsenio Loader, MD [facility] Pager [phone]   If 7PM-7AM, please contact night-coverage [email or web address] Password [facility] 05/20/2018, 9:51 AM

## 2018-05-21 ENCOUNTER — Inpatient Hospital Stay (HOSPITAL_COMMUNITY): Payer: Medicare Other

## 2018-05-21 LAB — CBC
HCT: 28.1 % — ABNORMAL LOW (ref 39.0–52.0)
HEMOGLOBIN: 9 g/dL — AB (ref 13.0–17.0)
MCH: 31.5 pg (ref 26.0–34.0)
MCHC: 32 g/dL (ref 30.0–36.0)
MCV: 98.3 fL (ref 78.0–100.0)
Platelets: 240 10*3/uL (ref 150–400)
RBC: 2.86 MIL/uL — AB (ref 4.22–5.81)
RDW: 14.7 % (ref 11.5–15.5)
WBC: 8.5 10*3/uL (ref 4.0–10.5)

## 2018-05-21 LAB — GLUCOSE, CAPILLARY
GLUCOSE-CAPILLARY: 123 mg/dL — AB (ref 70–99)
GLUCOSE-CAPILLARY: 134 mg/dL — AB (ref 70–99)
GLUCOSE-CAPILLARY: 175 mg/dL — AB (ref 70–99)
Glucose-Capillary: 145 mg/dL — ABNORMAL HIGH (ref 70–99)

## 2018-05-21 LAB — BASIC METABOLIC PANEL
ANION GAP: 15 (ref 5–15)
BUN: 91 mg/dL — ABNORMAL HIGH (ref 8–23)
CO2: 24 mmol/L (ref 22–32)
Calcium: 8.1 mg/dL — ABNORMAL LOW (ref 8.9–10.3)
Chloride: 102 mmol/L (ref 98–111)
Creatinine, Ser: 2.55 mg/dL — ABNORMAL HIGH (ref 0.61–1.24)
GFR calc Af Amer: 24 mL/min — ABNORMAL LOW (ref >60)
GFR calc non Af Amer: 21 mL/min — ABNORMAL LOW (ref >60)
GLUCOSE: 137 mg/dL — AB (ref 70–99)
POTASSIUM: 4.1 mmol/L (ref 3.5–5.1)
Sodium: 141 mmol/L (ref 135–145)

## 2018-05-21 LAB — PROCALCITONIN: PROCALCITONIN: 0.24 ng/mL

## 2018-05-21 LAB — MAGNESIUM: MAGNESIUM: 2.3 mg/dL (ref 1.7–2.4)

## 2018-05-21 NOTE — Progress Notes (Signed)
Modified Barium Swallow Progress Note  Patient Details  Name: [name] MRN: [id number] Date of Birth: 11-Mar-1929  Today's Date: 05/21/2018  Modified Barium Swallow completed.  Full report located under Chart Review in the Imaging Section.  Brief recommendations include the following:  Clinical Impression  Pt presents with normal oropharyngeal swallow with adequate mastication, timely swallow response, adequate propulsion of materials through pharynx, no aspiration. 13 mm barium pill lodged momentarily in valleculae, but then passed easily through UES and esophagus.  Pt coughed intermittently throughout exam, but this was unrelated to penetration/aspiration.  Continue regular diet, thin liquids.    Swallow Evaluation Recommendations       SLP Diet Recommendations: Regular solids;Thin liquid   Liquid Administration via: Cup;Straw   Medication Administration: Whole meds with liquid               Oral Care Recommendations: Oral care BID      [name] L. Tivis Ringer, [place] CCC/SLP Pager [phone]   [name] 05/21/2018,2:18 PM

## 2018-05-21 NOTE — Progress Notes (Signed)
Physical Therapy Treatment Patient Details Name: [name] MRN: [id number] DOB: Sep 11, 1929 Today's Date: 05/21/2018    History of Present Illness Pt is an 84 y.o. male with PMH significant for HTN, chronic venous insufficiency, HLD, pre-diabetes, hypothyroidism, history of gout, metastatic renal cell carcinoma s/p nephrectomy, osteomyelitis of R foot, and other comorbidities. He presented to the ED wtih progressive worsening of shortness of breath and dyspnea on exertion. Admitted with acute respiratory failure with hypoxia.     PT Comments    Co treat with OT due to patient easily fatiguing with mobility. Patient maintaining current level of mobility. Significant time and effort required for all transfers. Moderate assistance provided to stand and min guard to progress to chair, however, patient with very unsafe technique of leaning his forearms on the walker (causes an anterior weight shift)> Received on RA with SpO2 at 90%; one EOB decreased to 85% SpO2 and placed on 1L O2 for remainder of session. D/c plan remains appropriate.     Follow Up Recommendations  SNF;Supervision/Assistance - 24 hour     Equipment Recommendations  None recommended by PT    Recommendations for Other Services       Precautions / Restrictions Precautions Precautions: Fall Precaution Comments: Watch O2  Restrictions Weight Bearing Restrictions: No    Mobility  Bed Mobility Overal bed mobility: Needs Assistance Bed Mobility: Supine to Sit Rolling: Min assist;+2 for physical assistance   Supine to sit: Min assist        Transfers Overall transfer level: Needs assistance Equipment used: Rolling walker (2 wheeled) Transfers: Sit to/from Stand Sit to Stand: Mod assist;+2 physical assistance;+2 safety/equipment Stand pivot transfers: Min assist;+2 safety/equipment          Ambulation/Gait Ambulation/Gait assistance: Min guard;+2 safety/equipment Gait Distance (Feet): 2 Feet Assistive  device: Rolling walker (2 wheeled) Gait Pattern/deviations: Step-through pattern;Trunk flexed;Decreased stride length     General Gait Details: patient able to ambulate a few pivotal steps from bed to chair using walker. still relying with forearms on handles of walker (refuses to stand upright completely).    Stairs             Wheelchair Mobility    Modified Rankin (Stroke Patients Only)       Balance   Sitting-balance support: No upper extremity supported;Feet supported Sitting balance-Leahy Scale: Fair     Standing balance support: Bilateral upper extremity supported;No upper extremity supported;During functional activity Standing balance-Leahy Scale: Poor Standing balance comment: Relies on UE support and external assistance.                             Cognition Arousal/Alertness: Awake/alert Behavior During Therapy: Flat affect Overall Cognitive Status: No family/caregiver present to determine baseline cognitive functioning                                 General Comments: Pt unable to recall his own phone number      Exercises Other Exercises Other Exercises: Dynamic sitting balance with washing face, trunk and genitalia     General Comments        Pertinent Vitals/Pain Pain Assessment: No/denies pain    Home Living                      Prior Function            PT Goals (  current goals can now be found in the care plan section) Acute Rehab PT Goals Patient Stated Goal: to feel better Potential to Achieve Goals: Fair    Frequency    Min 3X/week      PT Plan      Co-evaluation PT/OT/SLP Co-Evaluation/Treatment: Yes Reason for Co-Treatment: For patient/therapist safety;To address functional/ADL transfers PT goals addressed during session: Mobility/safety with mobility;Proper use of DME OT goals addressed during session: ADL's and self-care      AM-PAC PT "6 Clicks" Daily Activity  Outcome  Measure  Difficulty turning over in bed (including adjusting bedclothes, sheets and blankets)?: A Lot Difficulty moving from lying on back to sitting on the side of the bed? : Unable Difficulty sitting down on and standing up from a chair with arms (e.g., wheelchair, bedside commode, etc,.)?: Unable Help needed moving to and from a bed to chair (including a wheelchair)?: A Little Help needed walking in hospital room?: A Lot Help needed climbing 3-5 steps with a railing? : Total 6 Click Score: 10    End of Session Equipment Utilized During Treatment: Gait belt;Oxygen Activity Tolerance: Patient tolerated treatment well Patient left: in chair;with call bell/phone within reach;with chair alarm set Nurse Communication: Mobility status PT Visit Diagnosis: Unsteadiness on feet (R26.81);Muscle weakness (generalized) (M62.81);Difficulty in walking, not elsewhere classified (R26.2)     Time: 0102-7253 PT Time Calculation (min) (ACUTE ONLY): 28 min  Charges:  $Therapeutic Activity: 8-22 mins                    G Codes:     Ellamae Sia, PT, DPT Acute Rehabilitation Services  Pager: [phone]    Willy Eddy 05/21/2018, 4:59 PM

## 2018-05-21 NOTE — Clinical Social Work Note (Addendum)
CSW continuing to monitor patient's progress and he is not yet ready for discharge. PT continuing to recommend SNF, however per earlier conversation with daughter (assessment), she plans to take patient home once his medical issues are resolved. CSW will continue to follow and follow-up with daughter regarding discharge disposition for patient.  [name], MSW, LCSW Licensed Clinical Social Worker [place] [phone]

## 2018-05-21 NOTE — Progress Notes (Signed)
Turned patient oxygen down to 2L nasal cannula explained tthat want to assess how well his O2 stats with the 2L. Arthor Captain LPN

## 2018-05-21 NOTE — Progress Notes (Signed)
Occupational Therapy Treatment Patient Details Name: [name] MRN: [id number] DOB: 1929-02-02 Today's Date: 05/21/2018    History of present illness Pt is an 84 y.o. male with PMH significant for HTN, chronic venous insufficiency, HLD, pre-diabetes, hypothyroidism, history of gout, metastatic renal cell carcinoma s/p nephrectomy, osteomyelitis of R foot, and other comorbidities. He presented to the ED wtih progressive worsening of shortness of breath and dyspnea on exertion. Admitted with acute respiratory failure with hypoxia.    OT comments  Pt seen with PT. Pt required increased time throughout session for rest breaks d/t fatigue. Skilled monitoring of vitals throughout to assess pulmonary stability, pt received on RA with SpO2 at 90%. Pt came to EOB with O2 reading 85% and pt asymptomatic. Pt placed back on 2LNC during stand pivot mod +2 transfer to chair. Vc required for thoroughness in UB/LB bathing EOB.    Follow Up Recommendations  SNF;Supervision/Assistance - 24 hour    Equipment Recommendations  3 in 1 bedside commode    Recommendations for Other Services      Precautions / Restrictions Precautions Precautions: Fall Precaution Comments: Watch O2  Restrictions Weight Bearing Restrictions: No       Mobility Bed Mobility Overal bed mobility: Needs Assistance Bed Mobility: Supine to Sit Rolling: Min assist;+2 for physical assistance   Supine to sit: Min assist        Transfers Overall transfer level: Needs assistance Equipment used: Rolling walker (2 wheeled) Transfers: Risk manager;Sit to/from Stand Sit to Stand: Mod assist;+2 physical assistance;+2 safety/equipment Stand pivot transfers: Min assist;+2 safety/equipment            Balance   Sitting-balance support: No upper extremity supported;Feet supported Sitting balance-Leahy Scale: Fair     Standing balance support: Bilateral upper extremity supported;No upper extremity  supported;During functional activity Standing balance-Leahy Scale: Poor Standing balance comment: Relies on UE support and external assistance.                            ADL either performed or assessed with clinical judgement   ADL       Grooming: Brushing hair;Wash/dry face;Sitting;Set up   Upper Body Bathing: Sitting;Minimal assistance   Lower Body Bathing: Sit to/from stand;Moderate assistance       Lower Body Dressing: Maximal assistance;+2 for physical assistance;+2 for safety/equipment               Functional mobility during ADLs: Moderate assistance;Rolling walker;+2 for physical assistance       Vision       Perception     Praxis      Cognition Arousal/Alertness: Awake/alert Behavior During Therapy: Flat affect Overall Cognitive Status: No family/caregiver present to determine baseline cognitive functioning                                 General Comments: Pt unable to recall his own phone number        Exercises     Shoulder Instructions       General Comments      Pertinent Vitals/ Pain       Pain Assessment: No/denies pain  Home Living  Prior Functioning/Environment              Frequency  Min 2X/week        Progress Toward Goals  OT Goals(current goals can now be found in the care plan section)  Progress towards OT goals: Progressing toward goals  Acute Rehab OT Goals Patient Stated Goal: to feel better  Plan Discharge plan remains appropriate;Frequency remains appropriate    Co-evaluation    PT/OT/SLP Co-Evaluation/Treatment: Yes Reason for Co-Treatment: For patient/therapist safety;To address functional/ADL transfers   OT goals addressed during session: ADL's and self-care      AM-PAC PT "6 Clicks" Daily Activity     Outcome Measure   Help from another person eating meals?: None Help from another person taking care of  personal grooming?: None Help from another person toileting, which includes using toliet, bedpan, or urinal?: A Lot Help from another person bathing (including washing, rinsing, drying)?: A Lot Help from another person to put on and taking off regular upper body clothing?: A Little Help from another person to put on and taking off regular lower body clothing?: A Lot 6 Click Score: 17    End of Session Equipment Utilized During Treatment: Gait belt;Rolling walker;Oxygen  OT Visit Diagnosis: Unsteadiness on feet (R26.81);Muscle weakness (generalized) (M62.81)   Activity Tolerance Patient limited by fatigue   Patient Left in chair;with call bell/phone within reach   Nurse Communication Mobility status        Time: 4496-7591 OT Time Calculation (min): 31 min  Charges: OT General Charges $OT Visit: 1 Visit OT Treatments $Self Care/Home Management : 8-22 mins    Curtis Sites OTR/L 05/21/2018, 4:27 PM

## 2018-05-21 NOTE — Progress Notes (Signed)
PROGRESS NOTE    [name]  XBM:[id number] DOB: [birth year] DOA: 05/14/2018 PCP: Unk Pinto, MD   Brief Narrative:  84 year old with a history of essential hypertension, chronic venous insufficiency, hyperlipidemia, prediabetes, hypothyroidism, gout, metastatic renal carcinoma status post nephrectomy, osteomyelitis of the right foot who came to the hospital with progressive worsening of the shortness of breath and dyspnea.  He has had bilateral chronic lower extremity edema for which she has been taking Bumex per his cardiologist.  Upon admission he was found to be likely be in fluid overload therefore started on IV diuresis but his creatinine started trending up despite of signs of fluid overload therefore case was discussed by the previous provider, Dr Alfredia Ferguson with Dr Jimmy Footman from [facility] who recommended continuing diuresis hold her Lasix.  Chest x-ray suggested possible early pneumonia with flare of COPD therefore started on steroids and breathing treatments as well.  He is also been on IV Rocephin and doxycycline.   Assessment & Plan:   Active Problems:   Cancer of kidney (HCC)   GERD (gastroesophageal reflux disease)   CKD, stage IV (GFR 28 ml/min) (HCC)   Hypothyroidism   Hyperlipidemia   CAD (coronary artery disease), native coronary artery   Essential hypertension   Anemia   Acute respiratory failure (HCC)   Occult blood in stools   Fluid overload   Reactive airway disease   Acute respiratory failure with hypoxia, multifactorial Fluid overload, improving Acute COPD exacerbation with underlying acute bronchitis - Echocardiogram limited but appears to have normal systolic and diastolic function -Continue 1 more day of oral prednisone -Continue doxycycline and Rocephin - Procalcitonin 0.24. - Continue nebulizer treatments scheduled and as necessary -Continue Dulera -Continue incentive spirometry and flutter valve.  Supplemental oxygen and wean off as  necessary -Case was discussed by Dr. Alfredia Ferguson with Dr. Jimmy Footman who recommended continuing oral Lasix.  Patient is tolerating this well.  Creatinine appears to be stable at this time but will closely monitor his BUN. -I am concerned that patient possibly having some microaspiration due to persistent coughing especially related to eating.  I will place speech and swallow consult-modified barium's swallow study.  I have educated patient and the daughter regarding using basic aspiration precautions.  Acute kidney injury on CKD stage IV - BUN rising, it is 91.  Likely secondary to steroid use.  Creatinine stable at 2.5  Coronary artery disease -Patient is currently chest pain-free.  Continue statin.  Apparently patient's aspirin was stopped due to concerns of positive Hemoccult.  No more signs of blood loss, therefore was resumed.  History of renal cell carcinoma status post metastasis to lung status post nephrectomy - Stable at this point.  Follow-up outpatient  Essential hypertension - Continue Lasix twice daily.  GERD -Continue PPI twice daily twice daily before meals.  Hyperlipidemia -Continue pravastatin 40 mg daily at bedtime  Hemoccult-positive blood at the time of admission -Patient refused further GI work-up.  He does have history of hemorrhoids.  We will continue to monitor hemoglobin which remained stable at this point.  Diabetes mellitus type 2 - Continue sliding scale  Hypothyroidism -Continue Synthroid 200 mcg daily  History of gout -Continue allopurinol 300 mg daily.  DVT prophylaxis: SCDs Code Status: DNR Family Communication: Daughter at bedside Disposition Plan: Maintain inpatient stay for another 24-48 hours  Consultants:   None  Procedures:   None  Antimicrobials:   Rocephin  Doxycycline   Subjective: No complaints this morning.  During eating breakfast every time he  eats and takes a bite he ends up having a little bit of coughing spell.  I  explained the daughter and the patient to take small bites and chew slowly.  I also educated on how to use basic aspiration precautions.  Review of Systems Otherwise negative except as per HPI, including: General = no fevers, chills, dizziness, malaise, fatigue HEENT/EYES = negative for pain, redness, loss of vision, double vision, blurred vision, loss of hearing, sore throat, hoarseness, dysphagia Cardiovascular= negative for chest pain, palpitation, murmurs, lower extremity swelling Respiratory/lungs= negative for shortness of breath, cough, hemoptysis, wheezing, mucus production Gastrointestinal= negative for nausea, vomiting,, abdominal pain, melena, hematemesis Genitourinary= negative for Dysuria, Hematuria, Change in Urinary Frequency MSK = Negative for arthralgia, myalgias, Back Pain, Joint swelling  Neurology= Negative for headache, seizures, numbness, tingling  Psychiatry= Negative for anxiety, depression, suicidal and homocidal ideation Allergy/Immunology= Medication/Food allergy as listed  Skin= Negative for Rash, lesions, ulcers, itching    Objective: Vitals:   05/21/18 0511 05/21/18 0817 05/21/18 0818 05/21/18 1022  BP: (!) 144/69   126/61  Pulse: 88   77  Resp: 20   20  Temp: 98.6 F (37 C)   98.1 F (36.7 C)  TempSrc: Oral   Oral  SpO2: 98% 95% 95% 95%  Weight:      Height:        Intake/Output Summary (Last 24 hours) at 05/21/2018 1032 Last data filed at 05/21/2018 0609 Gross per 24 hour  Intake 480 ml  Output 2300 ml  Net -1820 ml   Filed Weights   05/16/18 2050 05/18/18 2117 05/19/18 2107  Weight: 123.1 kg (271 lb 6.2 oz) 122.8 kg (270 lb 11.6 oz) 121.6 kg (268 lb 1.3 oz)    Examination:  Constitutional: NAD, calm, comfortable; difficulty hearing, on 2L [place], intermittent dry coughing  Eyes: PERRL, lids and conjunctivae normal ENMT: Mucous membranes are moist. Posterior pharynx clear of any exudate or lesions.Normal dentition.  Neck: normal, supple, no  masses, no thyromegaly Respiratory: clear to auscultation bilaterally, no wheezing, no crackles. Normal respiratory effort. No accessory muscle use.  Cardiovascular: Regular rate and rhythm, no murmurs / rubs / gallops. No extremity edema. 2+ pedal pulses. No carotid bruits.  Abdomen: no tenderness, no masses palpated. No hepatosplenomegaly. Bowel sounds positive.  Musculoskeletal: no clubbing / cyanosis. No joint deformity upper and lower extremities. Good ROM, no contractures. Normal muscle tone.  Skin: no rashes, lesions, ulcers. No induration Neurologic: CN 2-12 grossly intact. Sensation intact, DTR normal. Strength 5/5 in all 4.  Psychiatric: poor  judgment and insight. Alert and oriented x 3. Normal mood.    Data Reviewed:   CBC: Recent Labs  Lab 05/16/18 0707 05/17/18 2595 05/18/18 0414 05/19/18 0459 05/20/18 0538 05/21/18 0415  WBC 10.8* 12.6* 10.9* 10.2 9.7 8.5  NEUTROABS 9.4* 10.0* 8.9* 8.3*  --   --   HGB 8.0* 8.3* 8.7* 9.1* 9.0* 9.0*  HCT 25.1* 26.4* 27.5* 28.8* 28.3* 28.1*  MCV 98.4 98.5 99.3 98.0 98.3 98.3  PLT 215 215 236 240 240 638   Basic Metabolic Panel: Recent Labs  Lab 05/15/18 0542 05/16/18 0517 05/17/18 0635 05/18/18 0414 05/19/18 0459 05/20/18 0538 05/21/18 0415  NA 140 139 141 140 141 143 141  K 4.4 4.9 3.7 4.1 4.3 4.3 4.1  CL 107 108 108 105 103 103 102  CO2 24 20* 25 24 25 26 24   GLUCOSE 154* 131* 118* 124* 132* 115* 137*  BUN 46* 61* 71* 73* 75* 83*  91*  CREATININE 2.12* 2.72* 2.82* 2.77* 2.65* 2.71* 2.55*  CALCIUM 8.6* 8.0* 8.2* 8.1* 8.4* 8.4* 8.1*  MG 2.3 2.4 2.6* 2.5* 2.4 2.2 2.3  PHOS 4.3 5.2* 5.0* 5.3* 4.9*  --   --    GFR: Estimated Creatinine Clearance: 26.3 mL/min (A) (by C-G formula based on SCr of 2.55 mg/dL (H)). Liver Function Tests: Recent Labs  Lab 05/15/18 0542 05/16/18 0517 05/17/18 0635 05/18/18 0414 05/19/18 0459  AST 19 21 19 31  44*  ALT 18 15 18 27  46*  ALKPHOS 80 61 61 69 72  BILITOT 0.7 0.7 0.5 0.7 0.8   PROT 6.9 5.9* 6.2* 6.4* 6.5  ALBUMIN 2.9* 2.7* 2.9* 2.9* 3.1*   No results for input(s): LIPASE, AMYLASE in the last 168 hours. No results for input(s): AMMONIA in the last 168 hours. Coagulation Profile: No results for input(s): INR, PROTIME in the last 168 hours. Cardiac Enzymes: Recent Labs  Lab 05/15/18 0029 05/15/18 0542 05/15/18 1144  TROPONINI 0.03* 0.03* 0.03*   BNP (last 3 results) No results for input(s): PROBNP in the last 8760 hours. HbA1C: No results for input(s): HGBA1C in the last 72 hours. CBG: Recent Labs  Lab 05/20/18 0750 05/20/18 1142 05/20/18 1703 05/20/18 2208 05/21/18 0751  GLUCAP 100* 101* 179* 183* 123*   Lipid Profile: No results for input(s): CHOL, HDL, LDLCALC, TRIG, CHOLHDL, LDLDIRECT in the last 72 hours. Thyroid Function Tests: No results for input(s): TSH, T4TOTAL, FREET4, T3FREE, THYROIDAB in the last 72 hours. Anemia Panel: No results for input(s): VITAMINB12, FOLATE, FERRITIN, TIBC, IRON, RETICCTPCT in the last 72 hours. Sepsis Labs: Recent Labs  Lab 05/21/18 0415  PROCALCITON 0.24    Recent Results (from the past 240 hour(s))  MRSA PCR Screening     Status: None   Collection Time: 05/15/18  6:44 AM  Result Value Ref Range Status   MRSA by PCR NEGATIVE NEGATIVE Final    Comment:        The GeneXpert MRSA Assay (FDA approved for NASAL specimens only), is one component of a comprehensive MRSA colonization surveillance program. It is not intended to diagnose MRSA infection nor to guide or monitor treatment for MRSA infections. Performed at [facility], [place] [address], [place], [place] [address]          Radiology Studies: No results found.      Scheduled Meds: . allopurinol  300 mg Oral Daily  . aspirin EC  81 mg Oral Daily  . feeding supplement (GLUCERNA SHAKE)  237 mL Oral Daily  . feeding supplement (PRO-STAT SUGAR FREE 64)  30 mL Oral BID  . furosemide  80 mg Oral BID  . guaiFENesin  1,200 mg  Oral BID  . insulin aspart  0-15 Units Subcutaneous TID WC  . insulin aspart  0-5 Units Subcutaneous QHS  . ipratropium-albuterol  3 mL Nebulization BID  . levothyroxine  200 mcg Oral QAC breakfast  . mometasone-formoterol  2 puff Inhalation BID  . pantoprazole  40 mg Oral BID AC  . pravastatin  40 mg Oral QHS  . predniSONE  40 mg Oral Q breakfast  . sodium chloride flush  3 mL Intravenous Q12H   Continuous Infusions: . sodium chloride    . cefTRIAXone (ROCEPHIN)  IV 1 g (05/20/18 1016)     LOS: 6 days    I have spent 25 minutes face to face with the patient and on the ward discussing the patients care, assessment, plan and disposition with  other care givers. >50% of the time was devoted counseling the patient about the risks and benefits of treatment and coordinating care.     Ilyssa Grennan Arsenio Loader, MD [facility] Pager [phone]   If 7PM-7AM, please contact night-coverage [email or web address] Password [facility] 05/21/2018, 10:32 AM

## 2018-05-22 LAB — BASIC METABOLIC PANEL
ANION GAP: 13 (ref 5–15)
BUN: 87 mg/dL — AB (ref 8–23)
CALCIUM: 8.1 mg/dL — AB (ref 8.9–10.3)
CO2: 26 mmol/L (ref 22–32)
CREATININE: 2.59 mg/dL — AB (ref 0.61–1.24)
Chloride: 102 mmol/L (ref 98–111)
GFR calc Af Amer: 24 mL/min — ABNORMAL LOW (ref >60)
GFR, EST NON AFRICAN AMERICAN: 21 mL/min — AB (ref >60)
GLUCOSE: 164 mg/dL — AB (ref 70–99)
Potassium: 3.8 mmol/L (ref 3.5–5.1)
Sodium: 141 mmol/L (ref 135–145)

## 2018-05-22 LAB — GLUCOSE, CAPILLARY
GLUCOSE-CAPILLARY: 130 mg/dL — AB (ref 70–99)
GLUCOSE-CAPILLARY: 121 mg/dL — AB (ref 70–99)
Glucose-Capillary: 109 mg/dL — ABNORMAL HIGH (ref 70–99)
Glucose-Capillary: 127 mg/dL — ABNORMAL HIGH (ref 70–99)

## 2018-05-22 LAB — CBC
HCT: 25.4 % — ABNORMAL LOW (ref 39.0–52.0)
Hemoglobin: 8.3 g/dL — ABNORMAL LOW (ref 13.0–17.0)
MCH: 31.6 pg (ref 26.0–34.0)
MCHC: 32.7 g/dL (ref 30.0–36.0)
MCV: 96.6 fL (ref 78.0–100.0)
PLATELETS: 229 10*3/uL (ref 150–400)
RBC: 2.63 MIL/uL — ABNORMAL LOW (ref 4.22–5.81)
RDW: 14.7 % (ref 11.5–15.5)
WBC: 8.6 10*3/uL (ref 4.0–10.5)

## 2018-05-22 LAB — MAGNESIUM: Magnesium: 2.1 mg/dL (ref 1.7–2.4)

## 2018-05-22 MED ORDER — AZITHROMYCIN 500 MG PO TABS
500.0000 mg | ORAL_TABLET | Freq: Every day | ORAL | Status: AC
  Administered 2018-05-22 – 2018-05-24 (×3): 500 mg via ORAL
  Filled 2018-05-22 (×4): qty 1

## 2018-05-22 NOTE — Progress Notes (Addendum)
Daughter was asking if pt can be evaluated for OSA and CPAP need per [facility] at night always sleeping in recliner upright, poor sleep, nodding off often.

## 2018-05-22 NOTE — Progress Notes (Signed)
PROGRESS NOTE    [name]  JJK:[id number] DOB: 03/18/1929 DOA: 05/14/2018 PCP: Unk Pinto, MD   Brief Narrative:  84 year old with a history of essential hypertension, chronic venous insufficiency, hyperlipidemia, prediabetes, hypothyroidism, gout, metastatic renal carcinoma status post nephrectomy, osteomyelitis of the right foot who came to the hospital with progressive worsening of the shortness of breath and dyspnea.  He has had bilateral chronic lower extremity edema for which she has been taking Bumex per his cardiologist.  Upon admission he was found to be likely be in fluid overload therefore started on IV diuresis but his creatinine started trending up despite of signs of fluid overload therefore case was discussed by the previous provider, Dr Alfredia Ferguson with Dr Jimmy Footman from Nephro who recommended continuing diuresis hold her Lasix.  Chest x-ray suggested possible early pneumonia with flare of COPD therefore started on steroids and breathing treatments as well.  He is also been on IV Rocephin and doxycycline.   Assessment & Plan:   Active Problems:   Cancer of kidney (HCC)   GERD (gastroesophageal reflux disease)   CKD, stage IV (GFR 28 ml/min) (HCC)   Hypothyroidism   Hyperlipidemia   CAD (coronary artery disease), native coronary artery   Essential hypertension   Anemia   Acute respiratory failure (HCC)   Occult blood in stools   Fluid overload   Reactive airway disease   Acute respiratory failure with hypoxia, multifactorial Fluid overload, improving Acute COPD exacerbation with underlying acute bronchitis - Echocardiogram limited but appears to have normal systolic and diastolic function -Completed oral prednisone on 05/22/2018 -Continue  Rocephin, previously on doxycycline, use azithromycin to complete course - Procalcitonin 0.24. - Continue nebulizer treatments scheduled and as necessary -Continue Dulera -Continue incentive spirometry and flutter valve.   Supplemental oxygen and wean off as necessary -Case was discussed by Dr. Alfredia Ferguson with Dr. Jimmy Footman who recommended continuing oral Lasix, currently at 80 mg twice daily.  Patient is tolerating this well.  Creatinine appears to be stable at this time around 2.5, but will closely monitor his BUN.  Acute kidney injury on CKD stage IV---  - BUN rising most likely due to steroid use,, it is 87., no evidence of GI bleed, BUN should improve now that he has completed prednisone , creatinine stable at 2.5  Coronary artery disease -Patient is currently chest pain-free.  Continue aspirin and pravastatin,  apparently patient's aspirin was stopped previously due to concerns of positive Hemoccult.  No more signs of blood loss, therefore was resumed.  History of renal cell carcinoma status post metastasis to lung status post nephrectomy - Stable at this point.  Follow-up outpatient  Essential hypertension - Continue Lasix 80 mg p.o. twice daily.  GERD -Continue PPI twice daily twice daily before meals.  Hyperlipidemia -Continue pravastatin 40 mg daily at bedtime  Hemoccult-positive blood at the time of admission -Patient refused further GI work-up.  He does have history of hemorrhoids.  We will continue to monitor hemoglobin which remained stable at this point.  Diabetes mellitus type 2 - Continue sliding scale  Hypothyroidism -Continue Synthroid 200 mcg daily  History of gout -Continue allopurinol 300 mg daily.  FEN--oral intake is fair, patient passed a swallow evaluation, speech pathologist recommends regular consistency solids and thin liquids  Disposition Plan: Physical therapy recommends skilled nursing facility, patient's daughter indicates that she may be able to take him home, patient is very weak and debilitated, medically may be ready in 24 to 48 hours for discharge,  however  patient will need rehab to help him with mobility related activities of daily living   DVT prophylaxis:  SCDs Code Status: DNR Family Communication: No family by bedside today      Subjective: No new concerns, patient however is a bit more sleepy today, no chills denies shortness of breath at rest, very very weak      Objective: Vitals:   05/22/18 0839 05/22/18 1608 05/22/18 1936 05/22/18 1943  BP:  (!) 113/48    Pulse:  72    Resp:  18    Temp:  98 F (36.7 C)    TempSrc:  Oral    SpO2: 93% 90% 96% 96%  Weight:      Height:        Intake/Output Summary (Last 24 hours) at 05/22/2018 2004 Last data filed at 05/22/2018 1835 Gross per 24 hour  Intake 660 ml  Output 1480 ml  Net -820 ml   Filed Weights   05/16/18 2050 05/18/18 2117 05/19/18 2107  Weight: 123.1 kg (271 lb 6.2 oz) 122.8 kg (270 lb 11.6 oz) 121.6 kg (268 lb 1.3 oz)    Examination:  Constitutional: NAD, calm, comfortable; difficulty hearing, on 2L [place], intermittent dry coughing  Eyes: PERRL, lids and conjunctivae normal ENMT: Mucous membranes are moist. Posterior pharynx clear of any exudate or lesions. Neck: normal, supple, no masses, no thyromegaly Respiratory: Diminished breath sounds in bases, no wheezing Cardiovascular: Regular rate and rhythm, no murmurs / rubs / gallops. No extremity edema. 2+ pedal pulses. No carotid bruits.  Abdomen: no tenderness,  Bowel sounds positive.  Musculoskeletal: no clubbing / cyanosis. No joint deformity upper and lower extremities. Good ROM, no contractures. Normal muscle tone.  Skin: no rashes, lesions, ulcers. No induration Neurologic: Generalized weakness without new focal deficits patient is however very weak Psychiatric: poor  judgment and insight. Alert and oriented x 3. Normal mood.    Data Reviewed:   CBC: Recent Labs  Lab 05/16/18 0707 05/17/18 1610 05/18/18 0414 05/19/18 0459 05/20/18 0538 05/21/18 0415 05/22/18 0522  WBC 10.8* 12.6* 10.9* 10.2 9.7 8.5 8.6  NEUTROABS 9.4* 10.0* 8.9* 8.3*  --   --   --   HGB 8.0* 8.3* 8.7* 9.1* 9.0* 9.0* 8.3*  HCT  25.1* 26.4* 27.5* 28.8* 28.3* 28.1* 25.4*  MCV 98.4 98.5 99.3 98.0 98.3 98.3 96.6  PLT 215 215 236 240 240 240 960   Basic Metabolic Panel: Recent Labs  Lab 05/16/18 0517 05/17/18 0635 05/18/18 0414 05/19/18 0459 05/20/18 0538 05/21/18 0415 05/22/18 0522  NA 139 141 140 141 143 141 141  K 4.9 3.7 4.1 4.3 4.3 4.1 3.8  CL 108 108 105 103 103 102 102  CO2 20* 25 24 25 26 24 26   GLUCOSE 131* 118* 124* 132* 115* 137* 164*  BUN 61* 71* 73* 75* 83* 91* 87*  CREATININE 2.72* 2.82* 2.77* 2.65* 2.71* 2.55* 2.59*  CALCIUM 8.0* 8.2* 8.1* 8.4* 8.4* 8.1* 8.1*  MG 2.4 2.6* 2.5* 2.4 2.2 2.3 2.1  PHOS 5.2* 5.0* 5.3* 4.9*  --   --   --    GFR: Estimated Creatinine Clearance: 25.9 mL/min (A) (by C-G formula based on SCr of 2.59 mg/dL (H)). Liver Function Tests: Recent Labs  Lab 05/16/18 0517 05/17/18 0635 05/18/18 0414 05/19/18 0459  AST 21 19 31  44*  ALT 15 18 27  46*  ALKPHOS 61 61 69 72  BILITOT 0.7 0.5 0.7 0.8  PROT 5.9* 6.2* 6.4* 6.5  ALBUMIN 2.7* 2.9* 2.9*  3.1*   No results for input(s): LIPASE, AMYLASE in the last 168 hours. No results for input(s): AMMONIA in the last 168 hours. Coagulation Profile: No results for input(s): INR, PROTIME in the last 168 hours. Cardiac Enzymes: No results for input(s): CKTOTAL, CKMB, CKMBINDEX, TROPONINI in the last 168 hours. BNP (last 3 results) No results for input(s): PROBNP in the last 8760 hours. HbA1C: No results for input(s): HGBA1C in the last 72 hours. CBG: Recent Labs  Lab 05/21/18 1700 05/21/18 2119 05/22/18 0741 05/22/18 1151 05/22/18 1656  GLUCAP 175* 145* 130* 109* 127*   Lipid Profile: No results for input(s): CHOL, HDL, LDLCALC, TRIG, CHOLHDL, LDLDIRECT in the last 72 hours. Thyroid Function Tests: No results for input(s): TSH, T4TOTAL, FREET4, T3FREE, THYROIDAB in the last 72 hours. Anemia Panel: No results for input(s): VITAMINB12, FOLATE, FERRITIN, TIBC, IRON, RETICCTPCT in the last 72 hours. Sepsis  Labs: Recent Labs  Lab 05/21/18 0415  PROCALCITON 0.24    Recent Results (from the past 240 hour(s))  MRSA PCR Screening     Status: None   Collection Time: 05/15/18  6:44 AM  Result Value Ref Range Status   MRSA by PCR NEGATIVE NEGATIVE Final    Comment:        The GeneXpert MRSA Assay (FDA approved for NASAL specimens only), is one component of a comprehensive MRSA colonization surveillance program. It is not intended to diagnose MRSA infection nor to guide or monitor treatment for MRSA infections. Performed at [facility], [place] [address], [place], [place] [address]          Radiology Studies: Dg Swallowing Func-speech Pathology  Result Date: 05/21/2018 Objective Swallowing Evaluation: Type of Study: MBS-Modified Barium Swallow Study  Patient Details Name: BRYN PERKIN MRN: [id number] Date of Birth: 18-Jan-1929 Today's Date: 05/21/2018 Time: SLP Start Time (ACUTE ONLY): 1225 -SLP Stop Time (ACUTE ONLY): 4656 SLP Time Calculation (min) (ACUTE ONLY): 20 min Past Medical History: Past Medical History: Diagnosis Date . Anemia  . Arthritis   stenosis - back, neck . Cancer [facility])   renal neoplasm . Chronic kidney disease   renalCa- nephrectomy [facility]) 04/2012 . Coronary artery disease  . GERD (gastroesophageal reflux disease)  . Hard of hearing  . Hearing deficit   hearing aids- bilateral . Hypertension   sees Dr. Wynonia Lawman, stress test recently for prep for surgery . Hypothyroidism  . 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 Prediabetes  . Vitamin D deficiency  Past Surgical History: Past Surgical History: Procedure Laterality Date . ANTERIOR CERVICAL DECOMP/DISCECTOMY FUSION N/A 01/18/2013  Procedure: ANTERIOR CERVICAL DECOMPRESSION/DISCECTOMY FUSION 2 LEVELS;  Surgeon: Faythe Ghee, MD;  Location: [place] NEURO ORS;  Service: Neurosurgery;  Laterality: N/A; . APPENDECTOMY   . BACK SURGERY    lower back surgery - 1990's .  CHOLECYSTECTOMY   . CORONARY ANGIOPLASTY    stents , + 7-73yrs. ago . EYE SURGERY    right eye cataract surgery  . I&D EXTREMITY Right 09/08/2016  Procedure: IRRIGATION AND DEBRIDEMENT RIGHT FOOT WITH ANTIBIOTIC BEADS;  Surgeon: Edrick Kins, DPM;  Location: [place];  Service: Podiatry;  Laterality: Right; . JOINT REPLACEMENT    bilateral knee replacements  . LAPAROSCOPIC NEPHRECTOMY  04/29/2012  Procedure: LAPAROSCOPIC NEPHRECTOMY;  Surgeon: Dutch Gray, MD;  Location: [facility];  Service: Urology;  Laterality: Left;   . TOE ARTHROPLASTY Right 09/08/2016  Procedure: Arthroplasty of First MPJ RIGHT FOOT;  Surgeon: Edrick Kins, DPM;  Location: [place];  Service: Podiatry;  Laterality: Right; . TONSILLECTOMY   . WOUND DEBRIDEMENT Right 09/08/2016  Procedure: DEBRIDEMENT RIGHT ANKLE WOUND;  Surgeon: Edrick Kins, DPM;  Location: [place];  Service: Podiatry;  Laterality: Right; HPI: 84 year old with a history of essential hypertension, chronic venous insufficiency, GERD, hyperlipidemia, prediabetes, hypothyroidism, gout, metastatic renal carcinoma status post nephrectomy, osteomyelitis of the right foot who came to the hospital with progressive worsening of the shortness of breath and dyspnea.  Chest x-ray suggested possible early pneumonia. Dx ARF with hypoxia, COPD exacerbation.  Pt has been coughing intermittently with meals; MD concerned for aspiration and ordered MBS.   Subjective: alert Assessment / Plan / Recommendation CHL IP CLINICAL IMPRESSIONS 05/21/2018 Clinical Impression Pt presents with normal oropharyngeal swallow with adequate mastication, timely swallow response, adequate propulsion of materials through pharynx, no aspiration. 13 mm barium pill lodged momentarily in valleculae, but then passed easily through UES and esophagus.  Pt coughed intermittently throughout exam, but this was unrelated to penetration/aspiration.  Continue regular diet, thin liquids.  SLP Visit Diagnosis Dysphagia, unspecified (R13.10)  Attention and concentration deficit following -- Frontal lobe and executive function deficit following -- Impact on safety and function No limitations   CHL IP TREATMENT RECOMMENDATION 05/21/2018 Treatment Recommendations No treatment recommended at this time   No flowsheet data found. CHL IP DIET RECOMMENDATION 05/21/2018 SLP Diet Recommendations Regular solids;Thin liquid Liquid Administration via Cup;Straw Medication Administration Whole meds with liquid Compensations -- Postural Changes --   CHL IP OTHER RECOMMENDATIONS 05/21/2018 Recommended Consults -- Oral Care Recommendations Oral care BID Other Recommendations --   No flowsheet data found.  No flowsheet data found.     CHL IP ORAL PHASE 05/21/2018 Oral Phase WFL Oral - Pudding Teaspoon -- Oral - Pudding Cup -- Oral - Honey Teaspoon -- Oral - Honey Cup -- Oral - Nectar Teaspoon -- Oral - Nectar Cup -- Oral - Nectar Straw -- Oral - Thin Teaspoon -- Oral - Thin Cup -- Oral - Thin Straw -- Oral - Puree -- Oral - Mech Soft -- Oral - Regular -- Oral - Multi-Consistency -- Oral - Pill -- Oral Phase - Comment --  CHL IP PHARYNGEAL PHASE 05/21/2018 Pharyngeal Phase WFL Pharyngeal- Pudding Teaspoon -- Pharyngeal -- Pharyngeal- Pudding Cup -- Pharyngeal -- Pharyngeal- Honey Teaspoon -- Pharyngeal -- Pharyngeal- Honey Cup -- Pharyngeal -- Pharyngeal- Nectar Teaspoon -- Pharyngeal -- Pharyngeal- Nectar Cup -- Pharyngeal -- Pharyngeal- Nectar Straw -- Pharyngeal -- Pharyngeal- Thin Teaspoon -- Pharyngeal -- Pharyngeal- Thin Cup -- Pharyngeal -- Pharyngeal- Thin Straw -- Pharyngeal -- Pharyngeal- Puree -- Pharyngeal -- Pharyngeal- Mechanical Soft -- Pharyngeal -- Pharyngeal- Regular -- Pharyngeal -- Pharyngeal- Multi-consistency -- Pharyngeal -- Pharyngeal- Pill -- Pharyngeal -- Pharyngeal Comment --  No flowsheet data found. No flowsheet data found. [name] 05/21/2018, 2:19 PM                   Scheduled Meds: . allopurinol  300 mg Oral Daily  .  aspirin EC  81 mg Oral Daily  . feeding supplement (GLUCERNA SHAKE)  237 mL Oral Daily  . feeding supplement (PRO-STAT SUGAR FREE 64)  30 mL Oral BID  . furosemide  80 mg Oral BID  . guaiFENesin  1,200 mg Oral BID  . insulin aspart  0-15 Units Subcutaneous TID WC  . insulin aspart  0-5 Units Subcutaneous QHS  . ipratropium-albuterol  3 mL Nebulization BID  . levothyroxine  200 mcg Oral QAC breakfast  . mometasone-formoterol  2 puff Inhalation BID  . pantoprazole  40 mg Oral BID AC  . pravastatin  40 mg Oral QHS  . sodium chloride flush  3 mL Intravenous Q12H   Continuous Infusions: . sodium chloride    . cefTRIAXone (ROCEPHIN)  IV 1 g (05/22/18 1158)     LOS: 7 days      Roxan Hockey, MD [facility] Pager [phone]   If 7PM-7AM, please contact night-coverage [email or web address] Password [email or web address] 05/22/2018, 8:04 PM

## 2018-05-22 NOTE — Progress Notes (Signed)
[name], daughter would like to be present Sunday am for MD visit.

## 2018-05-23 DIAGNOSIS — J9601 Acute respiratory failure with hypoxia: Principal | ICD-10-CM

## 2018-05-23 LAB — BASIC METABOLIC PANEL
ANION GAP: 10 (ref 5–15)
BUN: 94 mg/dL — ABNORMAL HIGH (ref 8–23)
CHLORIDE: 103 mmol/L (ref 98–111)
CO2: 29 mmol/L (ref 22–32)
Calcium: 8.2 mg/dL — ABNORMAL LOW (ref 8.9–10.3)
Creatinine, Ser: 2.62 mg/dL — ABNORMAL HIGH (ref 0.61–1.24)
GFR calc non Af Amer: 20 mL/min — ABNORMAL LOW (ref >60)
GFR, EST AFRICAN AMERICAN: 24 mL/min — AB (ref >60)
GLUCOSE: 127 mg/dL — AB (ref 70–99)
POTASSIUM: 3.5 mmol/L (ref 3.5–5.1)
Sodium: 142 mmol/L (ref 135–145)

## 2018-05-23 LAB — CBC
HEMATOCRIT: 27.3 % — AB (ref 39.0–52.0)
HEMOGLOBIN: 8.7 g/dL — AB (ref 13.0–17.0)
MCH: 31.4 pg (ref 26.0–34.0)
MCHC: 31.9 g/dL (ref 30.0–36.0)
MCV: 98.6 fL (ref 78.0–100.0)
PLATELETS: 227 10*3/uL (ref 150–400)
RBC: 2.77 MIL/uL — AB (ref 4.22–5.81)
RDW: 14.8 % (ref 11.5–15.5)
WBC: 12.6 10*3/uL — AB (ref 4.0–10.5)

## 2018-05-23 LAB — GLUCOSE, CAPILLARY
Glucose-Capillary: 93 mg/dL (ref 70–99)
Glucose-Capillary: 113 mg/dL — ABNORMAL HIGH (ref 70–99)
Glucose-Capillary: 118 mg/dL — ABNORMAL HIGH (ref 70–99)
Glucose-Capillary: 218 mg/dL — ABNORMAL HIGH (ref 70–99)

## 2018-05-23 LAB — MAGNESIUM: Magnesium: 2.1 mg/dL (ref 1.7–2.4)

## 2018-05-23 NOTE — Progress Notes (Signed)
Out from his bed into his recliner at 1530 until 2300 as per report.

## 2018-05-23 NOTE — Progress Notes (Signed)
PROGRESS NOTE    [name]  SWN:[id number] DOB: [birth year] DOA: 05/14/2018 PCP: Unk Pinto, MD     Brief Narrative:  [name] is a 84 year old with a history of essential hypertension, chronic venous insufficiency, hyperlipidemia, prediabetes, hypothyroidism, gout, metastatic renal carcinoma status post nephrectomy, osteomyelitis of the right foot who came to the hospital with progressive worsening of the shortness of breath and dyspnea.  He has had bilateral chronic lower extremity edema for which he has been taking Bumex per his cardiologist.  Upon admission he was found to be likely be in fluid overload therefore started on IV diuresis but his creatinine started trending up despite of signs of fluid overload therefore case was discussed by the previous provider, Dr Alfredia Ferguson with Dr Jimmy Footman from Nephro who recommended continuing diuresis on his PO Lasix.  Chest x-ray suggested possible early pneumonia with flare of COPD therefore started on steroids and breathing treatments as well.  He is also been on IV Rocephin and doxycycline.  New events last 24 hours / Subjective: No events.  Complains of some right-sided abdominal pain that worsens with cough.  Breathing seems about the same.  Has nonproductive cough.  Assessment & Plan:   Active Problems:   Cancer of kidney (HCC)   GERD (gastroesophageal reflux disease)   CKD, stage IV (GFR 28 ml/min) (HCC)   Hypothyroidism   Hyperlipidemia   CAD (coronary artery disease), native coronary artery   Essential hypertension   Anemia   Acute respiratory failure (HCC)   Occult blood in stools   Fluid overload   Reactive airway disease   Acute respiratory failure with hypoxia, multifactorial Fluid overload, improving Acute COPD exacerbation with underlying acute bronchitis -Echocardiogram limited but appears to have normal systolic and diastolic function -Completed oral prednisone on 05/22/2018 -Continue Rocephin, azithromycin  to complete course -Continue nebulizer, Dulera, incentive spirometry and flutter valve -Currently on room air   Acute kidney injury on CKD stage IV -Currently stable. Dr. Chana Bode discussed with Nephrology, maintain on PO lasix for now   Coronary artery disease -Continue aspirin and pravastatin  History of renal cell carcinoma status post metastasis to lung status post nephrectomy -Stable  Essential hypertension -Continue Lasix   GERD -Continue PPI  Hyperlipidemia -Continue pravastatin   Hemoccult-positive blood at the time of admission -Patient refused further GI work-up.  He does have history of hemorrhoids. Hgb stable   Diabetes mellitus type 2 -Continue sliding scale insulin   Hypothyroidism -Continue Synthroid   History of gout -Continue allopurinol   DVT prophylaxis: SCD Code Status: DNR Family Communication: At bedside Disposition Plan: Client skilled nursing facility, daughter wants to take him home when ready.  If stable, plan for discharge home with home health services 7/15    Antimicrobials:  Anti-infectives (From admission, onward)   Start     Dose/Rate Route Frequency Ordered Stop   05/22/18 2015  azithromycin (ZITHROMAX) tablet 500 mg     500 mg Oral Daily 05/22/18 2007 05/25/18 0959   05/18/18 1000  cefTRIAXone (ROCEPHIN) 1 g in sodium chloride 0.9 % 100 mL IVPB     1 g 200 mL/hr over 30 Minutes Intravenous Every 24 hours 05/18/18 0918     05/15/18 2200  doxycycline (VIBRA-TABS) tablet 100 mg     100 mg Oral Every 12 hours 05/15/18 0918 05/19/18 2205   05/15/18 0200  doxycycline (VIBRAMYCIN) 100 mg in sodium chloride 0.9 % 250 mL IVPB  Status:  Discontinued     100  mg 125 mL/hr over 120 Minutes Intravenous Every 12 hours 05/15/18 0032 05/15/18 0918        Objective: Vitals:   05/22/18 2049 05/23/18 0451 05/23/18 0726 05/23/18 1023  BP: (!) 119/53 134/61 (!) 129/56   Pulse: 78 72 78   Resp: (!) 21 (!) 21 18   Temp: (!) 97.4 F  (36.3 C) 98.4 F (36.9 C) 98.3 F (36.8 C)   TempSrc: Oral     SpO2: 95% 95% 95% 97%  Weight: 121.6 kg (268 lb 2 oz)     Height:        Intake/Output Summary (Last 24 hours) at 05/23/2018 1313 Last data filed at 05/23/2018 0900 Gross per 24 hour  Intake 1143 ml  Output 950 ml  Net 193 ml   Filed Weights   05/18/18 2117 05/19/18 2107 05/22/18 2049  Weight: 122.8 kg (270 lb 11.6 oz) 121.6 kg (268 lb 1.3 oz) 121.6 kg (268 lb 2 oz)    Examination:  General exam: Appears calm and comfortable, hard of hearing  Respiratory system: Clear to auscultation. Respiratory effort normal. On room air  Cardiovascular system: S1 & S2 heard, RRR. No JVD, murmurs, rubs, gallops or clicks. No pedal edema. Gastrointestinal system: Abdomen is nondistended, soft and nontender. No organomegaly or masses felt. Normal bowel sounds heard. Central nervous system: Alert and oriented. No focal neurological deficits. Extremities: Symmetric 5 x 5 power. Skin: No rashes, lesions or ulcers Psychiatry: Judgement and insight appear normal. Mood & affect appropriate.   Data Reviewed: I have personally reviewed following labs and imaging studies  CBC: Recent Labs  Lab 05/17/18 0635 05/18/18 0414 05/19/18 0459 05/20/18 0538 05/21/18 0415 05/22/18 0522 05/23/18 0411  WBC 12.6* 10.9* 10.2 9.7 8.5 8.6 12.6*  NEUTROABS 10.0* 8.9* 8.3*  --   --   --   --   HGB 8.3* 8.7* 9.1* 9.0* 9.0* 8.3* 8.7*  HCT 26.4* 27.5* 28.8* 28.3* 28.1* 25.4* 27.3*  MCV 98.5 99.3 98.0 98.3 98.3 96.6 98.6  PLT 215 236 240 240 240 229 973   Basic Metabolic Panel: Recent Labs  Lab 05/17/18 0635 05/18/18 0414 05/19/18 0459 05/20/18 0538 05/21/18 0415 05/22/18 0522 05/23/18 0411  NA 141 140 141 143 141 141 142  K 3.7 4.1 4.3 4.3 4.1 3.8 3.5  CL 108 105 103 103 102 102 103  CO2 25 24 25 26 24 26 29   GLUCOSE 118* 124* 132* 115* 137* 164* 127*  BUN 71* 73* 75* 83* 91* 87* 94*  CREATININE 2.82* 2.77* 2.65* 2.71* 2.55* 2.59* 2.62*   CALCIUM 8.2* 8.1* 8.4* 8.4* 8.1* 8.1* 8.2*  MG 2.6* 2.5* 2.4 2.2 2.3 2.1 2.1  PHOS 5.0* 5.3* 4.9*  --   --   --   --    GFR: Estimated Creatinine Clearance: 25.6 mL/min (A) (by C-G formula based on SCr of 2.62 mg/dL (H)). Liver Function Tests: Recent Labs  Lab 05/17/18 0635 05/18/18 0414 05/19/18 0459  AST 19 31 44*  ALT 18 27 46*  ALKPHOS 61 69 72  BILITOT 0.5 0.7 0.8  PROT 6.2* 6.4* 6.5  ALBUMIN 2.9* 2.9* 3.1*   No results for input(s): LIPASE, AMYLASE in the last 168 hours. No results for input(s): AMMONIA in the last 168 hours. Coagulation Profile: No results for input(s): INR, PROTIME in the last 168 hours. Cardiac Enzymes: No results for input(s): CKTOTAL, CKMB, CKMBINDEX, TROPONINI in the last 168 hours. BNP (last 3 results) No results for input(s): PROBNP in the  last 8760 hours. HbA1C: No results for input(s): HGBA1C in the last 72 hours. CBG: Recent Labs  Lab 05/22/18 1151 05/22/18 1656 05/22/18 2049 05/23/18 0725 05/23/18 1139  GLUCAP 109* 127* 121* 93 113*   Lipid Profile: No results for input(s): CHOL, HDL, LDLCALC, TRIG, CHOLHDL, LDLDIRECT in the last 72 hours. Thyroid Function Tests: No results for input(s): TSH, T4TOTAL, FREET4, T3FREE, THYROIDAB in the last 72 hours. Anemia Panel: No results for input(s): VITAMINB12, FOLATE, FERRITIN, TIBC, IRON, RETICCTPCT in the last 72 hours. Sepsis Labs: Recent Labs  Lab 05/21/18 0415  PROCALCITON 0.24    Recent Results (from the past 240 hour(s))  MRSA PCR Screening     Status: None   Collection Time: 05/15/18  6:44 AM  Result Value Ref Range Status   MRSA by PCR NEGATIVE NEGATIVE Final    Comment:        The GeneXpert MRSA Assay (FDA approved for NASAL specimens only), is one component of a comprehensive MRSA colonization surveillance program. It is not intended to diagnose MRSA infection nor to guide or monitor treatment for MRSA infections. Performed at [facility], [place] [address], [place], [place] [address]        Radiology Studies: No results found.    Scheduled Meds: . allopurinol  300 mg Oral Daily  . aspirin EC  81 mg Oral Daily  . azithromycin  500 mg Oral Daily  . feeding supplement (GLUCERNA SHAKE)  237 mL Oral Daily  . feeding supplement (PRO-STAT SUGAR FREE 64)  30 mL Oral BID  . furosemide  80 mg Oral BID  . guaiFENesin  1,200 mg Oral BID  . insulin aspart  0-15 Units Subcutaneous TID WC  . insulin aspart  0-5 Units Subcutaneous QHS  . ipratropium-albuterol  3 mL Nebulization BID  . levothyroxine  200 mcg Oral QAC breakfast  . mometasone-formoterol  2 puff Inhalation BID  . pantoprazole  40 mg Oral BID AC  . pravastatin  40 mg Oral QHS  . sodium chloride flush  3 mL Intravenous Q12H   Continuous Infusions: . sodium chloride    . cefTRIAXone (ROCEPHIN)  IV 1 g (05/23/18 1029)     LOS: 8 days    Time spent: 25 minutes   Dessa Phi, DO [facility] [email or web address] Password [email or web address] 05/23/2018, 1:13 PM

## 2018-05-24 ENCOUNTER — Encounter: Payer: Self-pay | Admitting: Internal Medicine

## 2018-05-24 ENCOUNTER — Encounter (INDEPENDENT_AMBULATORY_CARE_PROVIDER_SITE_OTHER): Payer: Self-pay

## 2018-05-24 DIAGNOSIS — L8990 Pressure ulcer of unspecified site, unspecified stage: Secondary | ICD-10-CM

## 2018-05-24 LAB — CBC
HEMATOCRIT: 25.3 % — AB (ref 39.0–52.0)
Hemoglobin: 8 g/dL — ABNORMAL LOW (ref 13.0–17.0)
MCH: 31.4 pg (ref 26.0–34.0)
MCHC: 31.6 g/dL (ref 30.0–36.0)
MCV: 99.2 fL (ref 78.0–100.0)
Platelets: 210 10*3/uL (ref 150–400)
RBC: 2.55 MIL/uL — AB (ref 4.22–5.81)
RDW: 15 % (ref 11.5–15.5)
WBC: 10.9 10*3/uL — AB (ref 4.0–10.5)

## 2018-05-24 LAB — BASIC METABOLIC PANEL
ANION GAP: 11 (ref 5–15)
BUN: 91 mg/dL — ABNORMAL HIGH (ref 8–23)
CHLORIDE: 102 mmol/L (ref 98–111)
CO2: 28 mmol/L (ref 22–32)
CREATININE: 2.58 mg/dL — AB (ref 0.61–1.24)
Calcium: 7.9 mg/dL — ABNORMAL LOW (ref 8.9–10.3)
GFR calc non Af Amer: 21 mL/min — ABNORMAL LOW (ref >60)
GFR, EST AFRICAN AMERICAN: 24 mL/min — AB (ref >60)
Glucose, Bld: 103 mg/dL — ABNORMAL HIGH (ref 70–99)
POTASSIUM: 3.4 mmol/L — AB (ref 3.5–5.1)
SODIUM: 141 mmol/L (ref 135–145)

## 2018-05-24 LAB — GLUCOSE, CAPILLARY
GLUCOSE-CAPILLARY: 130 mg/dL — AB (ref 70–99)
Glucose-Capillary: 104 mg/dL — ABNORMAL HIGH (ref 70–99)

## 2018-05-24 LAB — MAGNESIUM: MAGNESIUM: 2 mg/dL (ref 1.7–2.4)

## 2018-05-24 MED ORDER — ALBUTEROL SULFATE HFA 108 (90 BASE) MCG/ACT IN AERS: 2.0000 | INHALATION_SPRAY | Freq: Four times a day (QID) | RESPIRATORY_TRACT | 0 refills | Status: AC | PRN

## 2018-05-24 MED ORDER — POTASSIUM CHLORIDE CRYS ER 20 MEQ PO TBCR
40.0000 meq | EXTENDED_RELEASE_TABLET | Freq: Once | ORAL | Status: AC
  Administered 2018-05-24: 40 meq via ORAL
  Filled 2018-05-24: qty 2

## 2018-05-24 MED ORDER — TIOTROPIUM BROMIDE MONOHYDRATE 18 MCG IN CAPS: 18.0000 ug | ORAL_CAPSULE | Freq: Every day | RESPIRATORY_TRACT | 0 refills | Status: AC

## 2018-05-24 MED ORDER — MOMETASONE FURO-FORMOTEROL FUM 200-5 MCG/ACT IN AERO: 2.0000 | INHALATION_SPRAY | Freq: Two times a day (BID) | RESPIRATORY_TRACT | 0 refills | Status: AC

## 2018-05-24 MED ORDER — FUROSEMIDE 80 MG PO TABS: 80.0000 mg | ORAL_TABLET | Freq: Two times a day (BID) | ORAL | 0 refills | Status: AC

## 2018-05-24 NOTE — Discharge Summary (Addendum)
Physician Discharge Summary  [name] TDV:[id number] DOB: 25-Sep-1929 DOA: 05/14/2018  PCP: Chad Pinto, MD  Admit date: 05/14/2018 Discharge date: 05/24/2018  Admitted From: Home Disposition:  Home, declined SNF   Recommendations for Outpatient Follow-up:  1. Follow up with PCP in 1 week  Home Health: PT OT   Equipment/Devices: None   Discharge Condition: Stable CODE STATUS: DNR  Diet recommendation: Heart healthy   Brief/Interim Summary: [name] is an 84 year old with a history of essential hypertension, chronic venous insufficiency, hyperlipidemia, prediabetes, hypothyroidism, gout, metastatic renal carcinoma status post nephrectomy, osteomyelitis of the right foot who came to the hospital with progressive worsening of the shortness of breath and dyspnea. He has had bilateral chronic lower extremity edema for which he has been taking Bumex per his cardiologist. Upon admission he was found to be likely be in fluid overload therefore started on IV diuresis but his creatinine started trending up despite of signs of fluid overload therefore case was discussed by the previous provider, [name] with [name] from Nephro who recommended continuing diuresis on his PO Lasix. Chest x-ray suggested possible early pneumonia with flare of COPD therefore started on steroids and breathing treatments as well. He is also been on IV Rocephin and doxycycline and completed treatment prior to discharge home.  Discharge Diagnoses:  Active Problems:   Cancer of kidney (HCC)   GERD (gastroesophageal reflux disease)   CKD, stage IV (GFR 28 ml/min) (HCC)   Hypothyroidism   Hyperlipidemia   CAD (coronary artery disease), native coronary artery   Essential hypertension   Anemia   Acute respiratory failure (HCC)   Occult blood in stools   Fluid overload   Reactive airway disease   Pressure injury of skin -Stage 1 buttocks   Acute respiratory failure with hypoxia,  multifactorial Fluid overload, improving Acute COPD exacerbation with underlying acute bronchitis -Echocardiogram limited but appears to have normal systolic and diastolic function -Completedoral prednisoneon 05/22/2018 -Completed 7 day antibiotic course on 05/24/2018  -Continue nebulizer, Dulera, incentive spirometry and flutter valve -Currently on room air   Acute kidney injury on CKD stage IV -Currently stable. Chad. Chana Malone discussed with Nephrology, maintain on PO lasix for now   Coronary artery disease -Continue aspirin and pravastatin  History of renal cell carcinoma status post metastasis to lung status post nephrectomy -Stable  Essential hypertension -Continue Lasix  GERD -Continue PPI  Hyperlipidemia -Continue pravastatin   Hemoccult-positive blood at the time of admission -Patient refused further GI work-up. He does have history of hemorrhoids. Hgb stable   Diabetes mellitus type 2 -Continue sliding scale insulin   Hypothyroidism -Continue Synthroid   History of gout -Continue allopurinol     Discharge Instructions  Discharge Instructions    Call MD for:  difficulty breathing, headache or visual disturbances   Complete by:  As directed    Call MD for:  extreme fatigue   Complete by:  As directed    Call MD for:  hives   Complete by:  As directed    Call MD for:  persistant dizziness or light-headedness   Complete by:  As directed    Call MD for:  persistant nausea and vomiting   Complete by:  As directed    Call MD for:  severe uncontrolled pain   Complete by:  As directed    Call MD for:  temperature >100.4   Complete by:  As directed    Diet - low sodium heart healthy   Complete  by:  As directed    Discharge instructions   Complete by:  As directed    You were cared for by a hospitalist during your hospital stay. If you have any questions about your discharge medications or the care you received while you were in the hospital after  you are discharged, you can call the unit and ask to speak with the hospitalist on call if the hospitalist that took care of you is not available. Once you are discharged, your primary care physician will handle any further medical issues. Please note that NO REFILLS for any discharge medications will be authorized once you are discharged, as it is imperative that you return to your primary care physician (or establish a relationship with a primary care physician if you do not have one) for your aftercare needs so that they can reassess your need for medications and monitor your lab values.   Increase activity slowly   Complete by:  As directed      Allergies as of 05/24/2018   No Known Allergies     Medication List    STOP taking these medications   bumetanide 2 MG tablet Commonly known as:  BUMEX   clobetasol cream 0.05 % Commonly known as:  TEMOVATE   menthol-cetylpyridinium 3 MG lozenge Commonly known as:  CEPACOL     TAKE these medications   albuterol 108 (90 Base) MCG/ACT inhaler Commonly known as:  PROVENTIL HFA;VENTOLIN HFA Inhale 2 puffs into the lungs every 6 (six) hours as needed for wheezing or shortness of breath.   allopurinol 300 MG tablet Commonly known as:  ZYLOPRIM TAKE 1 TABLET DAILY What changed:    how much to take  how to take this  when to take this   amLODipine 10 MG tablet Commonly known as:  NORVASC Take 10 mg by mouth daily. What changed:  Another medication with the same name was removed. Continue taking this medication, and follow the directions you see here.   aspirin EC 81 MG tablet Take 81 mg by mouth daily.   FERROUS SULFATE PO Take 25 mg by mouth daily.   furosemide 80 MG tablet Commonly known as:  LASIX Take 1 tablet (80 mg total) by mouth 2 (two) times daily.   levothyroxine 200 MCG tablet Commonly known as:  SYNTHROID, LEVOTHROID Take 200 mcg by mouth daily before breakfast. What changed:  Another medication with the same  name was removed. Continue taking this medication, and follow the directions you see here.   mometasone-formoterol 200-5 MCG/ACT Aero Commonly known as:  DULERA Inhale 2 puffs into the lungs 2 (two) times daily.   multivitamin with minerals Tabs tablet Take 1 tablet by mouth daily.   nitroGLYCERIN 0.4 MG SL tablet Commonly known as:  NITROSTAT Place 0.4 mg under the tongue every 5 (five) minutes as needed for chest pain.   pravastatin 40 MG tablet Commonly known as:  PRAVACHOL TAKE 1 TABLET AT BEDTIME   tiotropium 18 MCG inhalation capsule Commonly known as:  SPIRIVA HANDIHALER Place 1 capsule (18 mcg total) into inhaler and inhale daily.   Vitamin D 2000 units tablet Take 2,000 Units by mouth daily.      Follow-up Information    Chad Pinto, MD. Schedule an appointment as soon as possible for a visit in 1 week(s).   Specialty:  Internal Medicine Contact information: [address] [place] [address] [phone]          No Known Allergies   Consultations:  None   Procedures/Studies: Dg Chest 2 View  Result Date: 05/19/2018 CLINICAL DATA:  84 year old male with a history of shortness of breath EXAM: CHEST - 2 VIEW COMPARISON:  05/18/2018 FINDINGS: In cardiomediastinal silhouette unchanged, partially obscured by overlying lung and pleural disease. Persistently low lung volumes with right greater than left interstitial and airspace opacities. Blunting of the costophrenic angles with opacification of the sulcus on the lateral view. Surgical changes of the cervical region. IMPRESSION: Low lung volumes persist, with unchanged appearance of bilateral, right greater than left mixed interstitial and airspace opacities and associated pleural effusions. Electronically Signed   By: Corrie Mckusick D.O.   On: 05/19/2018 07:28   Dg Chest Port 1 View  Result Date: 05/18/2018 CLINICAL DATA:  Shortness of breath. History of renal malignancy, chronic renal  disease, coronary artery disease, and possible lung malignancy. EXAM: PORTABLE CHEST 1 VIEW COMPARISON:  Chest x-ray of May 17, 2018 FINDINGS: There is an azygos lobe anatomy on the right. There has been further increase in airspace opacities in the right mid and lower lung. Increased density at the left lung base in the retrocardiac region has developed as well. A trace of pleural fluid on the left is present. The heart is top-normal in size. The central pulmonary vascularity is mildly prominent. There is calcification in the wall of the thoracic aorta. IMPRESSION: Worsening of airspace opacity greatest on the right may reflect asymmetric pulmonary edema but superimposed pneumonia is not excluded. Persistent retrocardiac density which may reflect atelectasis or pneumonia. Trace left pleural effusion. When the patient can tolerate the procedure, a PA and lateral chest x-ray would be useful. Thoracic aortic atherosclerosis. Electronically Signed   By: David  Martinique M.D.   On: 05/18/2018 08:40   Dg Chest Port 1 View  Result Date: 05/17/2018 CLINICAL DATA:  Shortness of breath, coronary artery disease, stage 4 chronic kidney disease EXAM: PORTABLE CHEST 1 VIEW COMPARISON:  Portable chest x-ray of May 17, 2018 FINDINGS: The lungs are well-expanded. The interstitial markings remain increased. The cardiac silhouette is enlarged. The pulmonary vascularity is mildly engorged. There is a small left pleural effusion. There is calcification in the wall of the thoracic aorta. There are degenerative changes of both shoulders. IMPRESSION: CHF superimposed upon chronic bronchitic changes. No alveolar pneumonia. Thoracic aortic atherosclerosis. Electronically Signed   By: David  Martinique M.D.   On: 05/17/2018 07:29   Dg Chest Port 1 View  Result Date: 05/17/2018 CLINICAL DATA:  Shortness of breath. EXAM: PORTABLE CHEST 1 VIEW COMPARISON:  Radiograph yesterday at 0616 hour FINDINGS: Unchanged cardiomegaly and mediastinal  contours. There is aortic atherosclerosis. Increasing hazy opacity at the lung bases suggesting pleural effusions and atelectasis. Peribronchial cuffing suspicious for pulmonary edema. No pneumothorax. Incidental azygos fissure. IMPRESSION: Increasing hazy opacity at the lung bases likely increasing pleural effusions and atelectasis. Peribronchial cuffing suspicious for pulmonary edema with similar vascular congestion. Cardiomegaly and Aortic Atherosclerosis (ICD10-I70.0). Electronically Signed   By: Jeb Levering M.D.   On: 05/17/2018 01:40   Dg Chest Port 1 View  Result Date: 05/16/2018 CLINICAL DATA:  Shortness of breath EXAM: PORTABLE CHEST 1 VIEW COMPARISON:  May 14, 2018 FINDINGS: There is atelectatic change in the mid lower lung zones with small left pleural effusion. There is a slight degree of interstitial edema. Heart is enlarged with pulmonary vascularity normal. No adenopathy. There is aortic atherosclerosis. There is degenerative change in each shoulder. Postoperative changes noted in the lower cervical spine. Incidental note is made of  an azygos lobe on the right, an anatomic variant. IMPRESSION: Cardiomegaly with small left pleural effusion. Lower lung zone atelectasis with slight interstitial pulmonary edema. No frank consolidation evident. There is aortic atherosclerosis. Aortic Atherosclerosis (ICD10-I70.0). Electronically Signed   By: Lowella Grip III M.D.   On: 05/16/2018 07:30   Dg Chest Portable 1 View  Result Date: 05/14/2018 CLINICAL DATA:  Shortness of breath. EXAM: PORTABLE CHEST 1 VIEW COMPARISON:  CT chest 01/29/2018.  Chest x-ray 07/30/2015. FINDINGS: 2116 hours. Low lung volumes. Cardiopericardial silhouette is at upper limits of normal for size. There is pulmonary vascular congestion without overt pulmonary edema. Diffuse underlying interstitial opacity is associated with atelectasis at the bases with possible small left pleural effusion. The visualized bony structures of  the thorax are intact. Telemetry leads overlie the chest. IMPRESSION: 1. Upper normal heart size and vascular congestion. 2. Underlying chronic interstitial scars that underlying interstitial opacity may be chronic or related to interstitial edema. 3. Bibasilar atelectasis with possible tiny left pleural effusion. Electronically Signed   By: Misty Stanley M.D.   On: 05/14/2018 21:29   Dg Swallowing Func-speech Pathology  Result Date: 05/21/2018 Objective Swallowing Evaluation: Type of Study: MBS-Modified Barium Swallow Study  Patient Details Name: [name] MRN: [id number] Date of Birth: 1929-04-17 Today's Date: 05/21/2018 Time: SLP Start Time (ACUTE ONLY): 1225 -SLP Stop Time (ACUTE ONLY): 0938 SLP Time Calculation (min) (ACUTE ONLY): 20 min Past Medical History: Past Medical History: Diagnosis Date . Anemia  . Arthritis   stenosis - back, neck . Cancer [facility])   renal neoplasm . Chronic kidney disease   renalCa- nephrectomy [facility]) 04/2012 . Coronary artery disease  . GERD (gastroesophageal reflux disease)  . Hard of hearing  . Hearing deficit   hearing aids- bilateral . Hypertension   sees Chad. Wynonia Lawman, stress test recently for prep for surgery . Hypothyroidism  . 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 Prediabetes  . Vitamin D deficiency  Past Surgical History: Past Surgical History: Procedure Laterality Date . ANTERIOR CERVICAL DECOMP/DISCECTOMY FUSION N/A 01/18/2013  Procedure: ANTERIOR CERVICAL DECOMPRESSION/DISCECTOMY FUSION 2 LEVELS;  Surgeon: Faythe Ghee, MD;  Location: [place] [facility];  Service: Neurosurgery;  Laterality: N/A; . APPENDECTOMY   . BACK SURGERY    lower back surgery - 1990's . CHOLECYSTECTOMY   . CORONARY ANGIOPLASTY    stents , + 7-74yrs. ago . EYE SURGERY    right eye cataract surgery  . I&D EXTREMITY Right 09/08/2016  Procedure: IRRIGATION AND DEBRIDEMENT RIGHT FOOT WITH ANTIBIOTIC BEADS;  Surgeon: Edrick Kins, DPM;   Location: [place];  Service: Podiatry;  Laterality: Right; . JOINT REPLACEMENT    bilateral knee replacements  . LAPAROSCOPIC NEPHRECTOMY  04/29/2012  Procedure: LAPAROSCOPIC NEPHRECTOMY;  Surgeon: Dutch Gray, MD;  Location: [facility];  Service: Urology;  Laterality: Left;   . TOE ARTHROPLASTY Right 09/08/2016  Procedure: Arthroplasty of First MPJ RIGHT FOOT;  Surgeon: Edrick Kins, DPM;  Location: [place];  Service: Podiatry;  Laterality: Right; . TONSILLECTOMY   . WOUND DEBRIDEMENT Right 09/08/2016  Procedure: DEBRIDEMENT RIGHT ANKLE WOUND;  Surgeon: Edrick Kins, DPM;  Location: [place];  Service: Podiatry;  Laterality: Right; HPI: 84 year old with a history of essential hypertension, chronic venous insufficiency, GERD, hyperlipidemia, prediabetes, hypothyroidism, gout, metastatic renal carcinoma status post nephrectomy, osteomyelitis of the right foot who came to the hospital with progressive worsening of the shortness of breath and dyspnea.  Chest x-ray suggested possible early pneumonia. Dx ARF with hypoxia, COPD exacerbation.  Pt has been coughing intermittently with meals; MD concerned for aspiration and ordered MBS.   Subjective: alert Assessment / Plan / Recommendation CHL IP CLINICAL IMPRESSIONS 05/21/2018 Clinical Impression Pt presents with normal oropharyngeal swallow with adequate mastication, timely swallow response, adequate propulsion of materials through pharynx, no aspiration. 13 mm barium pill lodged momentarily in valleculae, but then passed easily through UES and esophagus.  Pt coughed intermittently throughout exam, but this was unrelated to penetration/aspiration.  Continue regular diet, thin liquids.  SLP Visit Diagnosis Dysphagia, unspecified (R13.10) Attention and concentration deficit following -- Frontal lobe and executive function deficit following -- Impact on safety and function No limitations   CHL IP TREATMENT RECOMMENDATION 05/21/2018 Treatment Recommendations No treatment recommended at  this time   No flowsheet data found. CHL IP DIET RECOMMENDATION 05/21/2018 SLP Diet Recommendations Regular solids;Thin liquid Liquid Administration via Cup;Straw Medication Administration Whole meds with liquid Compensations -- Postural Changes --   CHL IP OTHER RECOMMENDATIONS 05/21/2018 Recommended Consults -- Oral Care Recommendations Oral care BID Other Recommendations --   No flowsheet data found.  No flowsheet data found.     CHL IP ORAL PHASE 05/21/2018 Oral Phase WFL Oral - Pudding Teaspoon -- Oral - Pudding Cup -- Oral - Honey Teaspoon -- Oral - Honey Cup -- Oral - Nectar Teaspoon -- Oral - Nectar Cup -- Oral - Nectar Straw -- Oral - Thin Teaspoon -- Oral - Thin Cup -- Oral - Thin Straw -- Oral - Puree -- Oral - Mech Soft -- Oral - Regular -- Oral - Multi-Consistency -- Oral - Pill -- Oral Phase - Comment --  CHL IP PHARYNGEAL PHASE 05/21/2018 Pharyngeal Phase WFL Pharyngeal- Pudding Teaspoon -- Pharyngeal -- Pharyngeal- Pudding Cup -- Pharyngeal -- Pharyngeal- Honey Teaspoon -- Pharyngeal -- Pharyngeal- Honey Cup -- Pharyngeal -- Pharyngeal- Nectar Teaspoon -- Pharyngeal -- Pharyngeal- Nectar Cup -- Pharyngeal -- Pharyngeal- Nectar Straw -- Pharyngeal -- Pharyngeal- Thin Teaspoon -- Pharyngeal -- Pharyngeal- Thin Cup -- Pharyngeal -- Pharyngeal- Thin Straw -- Pharyngeal -- Pharyngeal- Puree -- Pharyngeal -- Pharyngeal- Mechanical Soft -- Pharyngeal -- Pharyngeal- Regular -- Pharyngeal -- Pharyngeal- Multi-consistency -- Pharyngeal -- Pharyngeal- Pill -- Pharyngeal -- Pharyngeal Comment --  No flowsheet data found. No flowsheet data found. [name] 05/21/2018, 2:19 PM               Echo 05/16/2018 Study Conclusions  - Left ventricle: The cavity size was normal. Systolic function was   normal. The estimated ejection fraction was in the range of 60%   to 65%. Wall motion was normal; there were no regional wall   motion abnormalities. Left ventricular diastolic function   parameters were  normal.  Impressions:  - Very limited study quality, however there appears to be normal   biventricular systolic function and no significant valvular  abnormalities.    Discharge Exam: Vitals:   05/23/18 2058 05/24/18 0516  BP: 139/63 (!) 146/57  Pulse: 76 80  Resp: (!) 21 20  Temp: 97.6 F (36.4 C) 98.4 F (36.9 C)  SpO2: 100% 96%    General: Pt is alert, awake, not in acute distress, hard of hearing  Cardiovascular: RRR, S1/S2 +, no rubs, no gallops Respiratory: CTA bilaterally, no wheezing, no rhonchi, on room air Abdominal: Soft, NT, ND, bowel sounds + Extremities: no edema, no cyanosis    The results of significant diagnostics from this hospitalization (including imaging, microbiology, ancillary  and laboratory) are listed below for reference.     Microbiology: Recent Results (from the past 240 hour(s))  MRSA PCR Screening     Status: None   Collection Time: 05/15/18  6:44 AM  Result Value Ref Range Status   MRSA by PCR NEGATIVE NEGATIVE Final    Comment:        The GeneXpert MRSA Assay (FDA approved for NASAL specimens only), is one component of a comprehensive MRSA colonization surveillance program. It is not intended to diagnose MRSA infection nor to guide or monitor treatment for MRSA infections. Performed at [facility], [place] [address], [place], [place] [address]      Labs: BNP (last 3 results) Recent Labs    05/14/18 2113 05/20/18 0538  BNP 312.9* 629.5*   Basic Metabolic Panel: Recent Labs  Lab 05/18/18 0414 05/19/18 0459 05/20/18 0538 05/21/18 0415 05/22/18 0522 05/23/18 0411 05/24/18 0354  NA 140 141 143 141 141 142 141  K 4.1 4.3 4.3 4.1 3.8 3.5 3.4*  CL 105 103 103 102 102 103 102  CO2 24 25 26 24 26 29 28   GLUCOSE 124* 132* 115* 137* 164* 127* 103*  BUN 73* 75* 83* 91* 87* 94* 91*  CREATININE 2.77* 2.65* 2.71* 2.55* 2.59* 2.62* 2.58*  CALCIUM 8.1* 8.4* 8.4* 8.1* 8.1* 8.2* 7.9*  MG 2.5* 2.4 2.2 2.3 2.1 2.1 2.0   PHOS 5.3* 4.9*  --   --   --   --   --    Liver Function Tests: Recent Labs  Lab 05/18/18 0414 05/19/18 0459  AST 31 44*  ALT 27 46*  ALKPHOS 69 72  BILITOT 0.7 0.8  PROT 6.4* 6.5  ALBUMIN 2.9* 3.1*   No results for input(s): LIPASE, AMYLASE in the last 168 hours. No results for input(s): AMMONIA in the last 168 hours. CBC: Recent Labs  Lab 05/18/18 0414 05/19/18 0459 05/20/18 0538 05/21/18 0415 05/22/18 0522 05/23/18 0411 05/24/18 0354  WBC 10.9* 10.2 9.7 8.5 8.6 12.6* 10.9*  NEUTROABS 8.9* 8.3*  --   --   --   --   --   HGB 8.7* 9.1* 9.0* 9.0* 8.3* 8.7* 8.0*  HCT 27.5* 28.8* 28.3* 28.1* 25.4* 27.3* 25.3*  MCV 99.3 98.0 98.3 98.3 96.6 98.6 99.2  PLT 236 240 240 240 229 227 210   Cardiac Enzymes: No results for input(s): CKTOTAL, CKMB, CKMBINDEX, TROPONINI in the last 168 hours. BNP: Invalid input(s): POCBNP CBG: Recent Labs  Lab 05/23/18 0725 05/23/18 1139 05/23/18 1649 05/23/18 2058 05/24/18 0751  GLUCAP 93 113* 118* 218* 104*   D-Dimer No results for input(s): DDIMER in the last 72 hours. Hgb A1c No results for input(s): HGBA1C in the last 72 hours. Lipid Profile No results for input(s): CHOL, HDL, LDLCALC, TRIG, CHOLHDL, LDLDIRECT in the last 72 hours. Thyroid function studies No results for input(s): TSH, T4TOTAL, T3FREE, THYROIDAB in the last 72 hours.  Invalid input(s): FREET3 Anemia work up No results for input(s): VITAMINB12, FOLATE, FERRITIN, TIBC, IRON, RETICCTPCT in the last 72 hours. Urinalysis    Component Value Date/Time   COLORURINE YELLOW 05/15/2018 0029   APPEARANCEUR HAZY (A) 05/15/2018 0029   LABSPEC 1.011 05/15/2018 0029   PHURINE 5.0 05/15/2018 0029   GLUCOSEU NEGATIVE 05/15/2018 0029   HGBUR NEGATIVE 05/15/2018 0029   BILIRUBINUR NEGATIVE 05/15/2018 0029   KETONESUR NEGATIVE 05/15/2018 0029   PROTEINUR NEGATIVE 05/15/2018 0029   UROBILINOGEN 0.2 12/20/2013 1913   NITRITE NEGATIVE 05/15/2018 0029  LEUKOCYTESUR TRACE (A)  05/15/2018 0029   Sepsis Labs Invalid input(s): PROCALCITONIN,  WBC,  LACTICIDVEN Microbiology Recent Results (from the past 240 hour(s))  MRSA PCR Screening     Status: None   Collection Time: 05/15/18  6:44 AM  Result Value Ref Range Status   MRSA by PCR NEGATIVE NEGATIVE Final    Comment:        The GeneXpert MRSA Assay (FDA approved for NASAL specimens only), is one component of a comprehensive MRSA colonization surveillance program. It is not intended to diagnose MRSA infection nor to guide or monitor treatment for MRSA infections. Performed at [facility], [place] [address] [place], [place] [address]      Patient was seen and examined on the day of discharge and was found to be in stable condition. Time coordinating discharge: 25 minutes including assessment and coordination of care, as well as examination of the patient.   SIGNED:  Dessa Phi, DO [facility] Pager [phone]  If 7PM-7AM, please contact night-coverage [email or web address] Password [email or web address] 05/24/2018, 10:20 AM

## 2018-05-24 NOTE — Care Management (Signed)
Case manager spoke with patient and daughter, [name], concerning discharge plan and DME. Juliann Pulse said that she will be taking her dad back home, he will not go to SNF. She plans to have her dad's primary MD order resumption of  Home Health services through [facility]. They have all necessary DME at the home.

## 2018-05-31 ENCOUNTER — Ambulatory Visit: Payer: Medicare Other | Admitting: Internal Medicine

## 2018-05-31 ENCOUNTER — Encounter: Payer: Self-pay | Admitting: Internal Medicine

## 2018-05-31 ENCOUNTER — Other Ambulatory Visit: Payer: Self-pay | Admitting: Internal Medicine

## 2018-05-31 ENCOUNTER — Telehealth: Payer: Self-pay | Admitting: *Deleted

## 2018-05-31 VITALS — BP 122/66 | HR 83 | Temp 97.7°F | Resp 18 | Ht 68.0 in | Wt 255.0 lb

## 2018-05-31 DIAGNOSIS — J209 Acute bronchitis, unspecified: Secondary | ICD-10-CM

## 2018-05-31 DIAGNOSIS — E8770 Fluid overload, unspecified: Secondary | ICD-10-CM

## 2018-05-31 DIAGNOSIS — E782 Mixed hyperlipidemia: Secondary | ICD-10-CM

## 2018-05-31 DIAGNOSIS — E039 Hypothyroidism, unspecified: Secondary | ICD-10-CM

## 2018-05-31 DIAGNOSIS — Z85528 Personal history of other malignant neoplasm of kidney: Secondary | ICD-10-CM

## 2018-05-31 DIAGNOSIS — N179 Acute kidney failure, unspecified: Secondary | ICD-10-CM

## 2018-05-31 DIAGNOSIS — J440 Chronic obstructive pulmonary disease with acute lower respiratory infection: Secondary | ICD-10-CM

## 2018-05-31 DIAGNOSIS — R7303 Prediabetes: Secondary | ICD-10-CM | POA: Diagnosis not present

## 2018-05-31 DIAGNOSIS — J9601 Acute respiratory failure with hypoxia: Secondary | ICD-10-CM

## 2018-05-31 DIAGNOSIS — I2510 Atherosclerotic heart disease of native coronary artery without angina pectoris: Secondary | ICD-10-CM

## 2018-05-31 DIAGNOSIS — N189 Chronic kidney disease, unspecified: Secondary | ICD-10-CM

## 2018-05-31 DIAGNOSIS — I509 Heart failure, unspecified: Secondary | ICD-10-CM

## 2018-05-31 DIAGNOSIS — M1000 Idiopathic gout, unspecified site: Secondary | ICD-10-CM

## 2018-05-31 DIAGNOSIS — Z79899 Other long term (current) drug therapy: Secondary | ICD-10-CM

## 2018-05-31 DIAGNOSIS — K219 Gastro-esophageal reflux disease without esophagitis: Secondary | ICD-10-CM

## 2018-05-31 DIAGNOSIS — D631 Anemia in chronic kidney disease: Secondary | ICD-10-CM

## 2018-05-31 DIAGNOSIS — N184 Chronic kidney disease, stage 4 (severe): Secondary | ICD-10-CM

## 2018-05-31 DIAGNOSIS — R0603 Acute respiratory distress: Secondary | ICD-10-CM

## 2018-05-31 DIAGNOSIS — C642 Malignant neoplasm of left kidney, except renal pelvis: Secondary | ICD-10-CM

## 2018-05-31 LAB — CBC WITH DIFFERENTIAL/PLATELET
BASOS ABS: 105 {cells}/uL (ref 0–200)
Basophils Relative: 0.8 %
EOS ABS: 262 {cells}/uL (ref 15–500)
EOS PCT: 2 %
HEMATOCRIT: 26.1 % — AB (ref 38.5–50.0)
HEMOGLOBIN: 8.8 g/dL — AB (ref 13.2–17.1)
LYMPHS ABS: 1074 {cells}/uL (ref 850–3900)
MCH: 32.2 pg (ref 27.0–33.0)
MCHC: 33.7 g/dL (ref 32.0–36.0)
MCV: 95.6 fL (ref 80.0–100.0)
MPV: 11.8 fL (ref 7.5–12.5)
Monocytes Relative: 9.7 %
NEUTROS ABS: 10388 {cells}/uL — AB (ref 1500–7800)
NEUTROS PCT: 79.3 %
Platelets: 243 10*3/uL (ref 140–400)
RBC: 2.73 10*6/uL — ABNORMAL LOW (ref 4.20–5.80)
RDW: 14.2 % (ref 11.0–15.0)
Total Lymphocyte: 8.2 %
WBC mixed population: 1271 cells/uL — ABNORMAL HIGH (ref 200–950)
WBC: 13.1 10*3/uL — ABNORMAL HIGH (ref 3.8–10.8)

## 2018-05-31 LAB — COMPLETE METABOLIC PANEL WITH GFR
AG Ratio: 1 (calc) (ref 1.0–2.5)
ALBUMIN MSPROF: 3.4 g/dL — AB (ref 3.6–5.1)
ALKALINE PHOSPHATASE (APISO): 92 U/L (ref 40–115)
ALT: 16 U/L (ref 9–46)
AST: 18 U/L (ref 10–35)
BUN / CREAT RATIO: 23 (calc) — AB (ref 6–22)
BUN: 56 mg/dL — ABNORMAL HIGH (ref 7–25)
CHLORIDE: 101 mmol/L (ref 98–110)
CO2: 27 mmol/L (ref 20–32)
CREATININE: 2.43 mg/dL — AB (ref 0.70–1.11)
Calcium: 8.8 mg/dL (ref 8.6–10.3)
GFR, Est African American: 27 mL/min/{1.73_m2} — ABNORMAL LOW (ref >=60)
GFR, Est Non African American: 23 mL/min/{1.73_m2} — ABNORMAL LOW (ref >=60)
GLOBULIN: 3.3 g/dL (ref 1.9–3.7)
Glucose, Bld: 172 mg/dL — ABNORMAL HIGH (ref 65–99)
Potassium: 4.5 mmol/L (ref 3.5–5.3)
Sodium: 139 mmol/L (ref 135–146)
Total Bilirubin: 0.9 mg/dL (ref 0.2–1.2)
Total Protein: 6.7 g/dL (ref 6.1–8.1)

## 2018-05-31 MED ORDER — LEVOTHYROXINE SODIUM 200 MCG PO TABS: ORAL_TABLET | ORAL | 3 refills | Status: AC

## 2018-05-31 MED ORDER — LEVOTHYROXINE SODIUM 200 MCG PO TABS: ORAL_TABLET | ORAL | 1 refills | Status: DC

## 2018-05-31 NOTE — Progress Notes (Addendum)
[name]     This very nice 84 y.o. WWM was admitted to the hospital on 7/5/2-019 and patient was discharged from the hospital on 05/24/2018. The patient now presents for follow up for transition from recent hospitalization.  Patient declined Rehab center. The day after discharge  our clinical staff contacted the patient to assure stability and schedule a follow up appointment. The discharge summary, medications and diagnostic test results were reviewed before meeting with the patient. The patient was admitted for: \  Acute respiratory failure with hypoxia Hypervolemia, unspecified hypervolemia type COPD (chronic obstructive pulmonary disease) with acute bronchitis  Acute kidney injury superimposed on CKD CKD (chronic kidney disease) stage 4, GFR 15-29 ml/min  Anemia in stage 4 chronic kidney disease  Coronary artery disease involving native coronary artery of native heart without angina pectoris History of renal cell cancer Gastroesophageal reflux disease Idiopathic gout Hypothyroidism Hyperlipidemia, mixed Prediabetes     Patient was admitted for suspected in Acute Respiratory Failure with CHF exacerbation consequent of  COPD exascerbation and progressive anemia. He was diuresed and treated with IV Rocephin & completed Doxycycline prior to discharge.      Hospitalization discharge instructions and medications are reconciled with the patient & his caretaker daughter Juliann Pulse w/POA) .      Patient is also followed with Hypertension, Hyperlipidemia, Pre-Diabetes and Vitamin D Deficiency.      Patient is treated for HTN (1997) and has HT CKD4  & BP has been controlled at home. He is followed by Dr Moshe Cipro. Today's BP is at goal - 122/66. He had a PCA/Stenting in 2009 by Dr Wynonia Lawman. Patient has had no complaints of any cardiac type chest pain, palpitations, dyspnea/orthopnea/PND, dizziness, claudication, or dependent edema.     Hyperlipidemia is near controlled with diet &  meds. Patient denies myalgias or other med SE's. Last Lipids were near goal: Lab Results  Component Value Date   CHOL 169 01/26/2018   HDL 45 01/26/2018   LDLCALC 102 (H) 01/26/2018   TRIG 119 01/26/2018   CHOLHDL 3.8 01/26/2018      Also, the patient has history of  PreDiabetes (A1c 6.0%/2006 & 2017)  attempting dietary control and has had no symptoms of reactive hypoglycemia, diabetic polys, paresthesias or visual blurring.  Last A1c was not at goal:  Lab Results  Component Value Date   HGBA1C 6.2 (H) 05/15/2018      Further, the patient also has history of Vitamin D Deficiency ("24"/2008) and supplements vitamin D without any suspected side-effects. Last vitamin D was at goal:  Lab Results  Component Value Date   VD25OH 63 01/26/2018   Current Outpatient Medications on File Prior to Visit  Medication Sig  . albuterol (PROVENTIL HFA;VENTOLIN HFA) 108 (90 Base) MCG/ACT inhaler Inhale 2 puffs into the lungs every 6 (six) hours as needed for wheezing or shortness of breath.  . allopurinol (ZYLOPRIM) 300 MG tablet TAKE 1 TABLET DAILY (Patient taking differently: Take 150mg  daily)  . amLODipine (NORVASC) 10 MG tablet Take 10 mg by mouth daily.  Marland Kitchen aspirin EC 81 MG tablet Take 81 mg by mouth daily.  . Cholecalciferol (VITAMIN D) 2000 units tablet Take 2,000 Units by mouth daily.   Marland Kitchen FERROUS SULFATE PO Take 25 mg by mouth daily.   . furosemide (LASIX) 80 MG tablet Take 1 tablet (80 mg total) by mouth 2 (two) times daily.  . mometasone-formoterol (DULERA) 200-5 MCG/ACT AERO Inhale 2 puffs into the lungs 2 (two) times daily.  Marland Kitchen  Multiple Vitamin (MULITIVITAMIN WITH MINERALS) TABS Take 1 tablet by mouth daily.  . nitroGLYCERIN (NITROSTAT) 0.4 MG SL tablet Place 0.4 mg under the tongue every 5 (five) minutes as needed for chest pain.   . pravastatin (PRAVACHOL) 40 MG tablet TAKE 1 TABLET AT BEDTIME  . tiotropium (SPIRIVA HANDIHALER) 18 MCG inhalation capsule Place 1 capsule (18 mcg total) into  inhaler and inhale daily.   No current facility-administered medications on file prior to visit.    No Known Allergies PMHx:   Past Medical History:  Diagnosis Date  . Anemia   . Arthritis    stenosis - back, neck  . Cancer [facility])    renal neoplasm  . Chronic kidney disease    renalCa- nephrectomy [facility]) 04/2012  . Coronary artery disease   . GERD (gastroesophageal reflux disease)   . Hard of hearing   . Hearing deficit    hearing aids- bilateral  . Hypertension    sees Dr. Wynonia Lawman, stress test recently for prep for surgery  . Hypothyroidism   . 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 Prediabetes   . Vitamin D deficiency    Immunization History  Administered Date(s) Administered  . Influenza Split 09/06/2013  . Influenza, High Dose Seasonal PF 07/20/2014, 08/20/2015, 07/24/2016  . Influenza,inj,Quad PF,6+ Mos 08/05/2017  . PPD Test 09/12/2016  . Pneumococcal Conjugate-13 10/30/2014  . Pneumococcal Polysaccharide-23 02/18/2012  . Td 02/18/2012  . Zoster 03/03/2013   Past Surgical History:  Procedure Laterality Date  . ANTERIOR CERVICAL DECOMP/DISCECTOMY FUSION N/A 01/18/2013   Procedure: ANTERIOR CERVICAL DECOMPRESSION/DISCECTOMY FUSION 2 LEVELS;  Surgeon: Faythe Ghee, MD;  Location: [place] [facility];  Service: Neurosurgery;  Laterality: N/A;  . APPENDECTOMY    . BACK SURGERY     lower back surgery - 1990's  . CHOLECYSTECTOMY    . CORONARY ANGIOPLASTY     stents , + 7-80yrs. ago  . EYE SURGERY     right eye cataract surgery   . I&D EXTREMITY Right 09/08/2016   Procedure: IRRIGATION AND DEBRIDEMENT RIGHT FOOT WITH ANTIBIOTIC BEADS;  Surgeon: Edrick Kins, DPM;  Location: [place];  Service: Podiatry;  Laterality: Right;  . JOINT REPLACEMENT     bilateral knee replacements   . LAPAROSCOPIC NEPHRECTOMY  04/29/2012   Procedure: LAPAROSCOPIC NEPHRECTOMY;  Surgeon: Dutch Gray, MD;  Location: [facility];  Service:  Urology;  Laterality: Left;      . TOE ARTHROPLASTY Right 09/08/2016   Procedure: Arthroplasty of First MPJ RIGHT FOOT;  Surgeon: Edrick Kins, DPM;  Location: [place];  Service: Podiatry;  Laterality: Right;  . TONSILLECTOMY    . WOUND DEBRIDEMENT Right 09/08/2016   Procedure: DEBRIDEMENT RIGHT ANKLE WOUND;  Surgeon: Edrick Kins, DPM;  Location: [place];  Service: Podiatry;  Laterality: Right;   FHx:    Reviewed / unchanged  SHx:    Reviewed / unchanged  Systems Review:  Constitutional: Denies fever, chills, wt changes, headaches, insomnia, fatigue, night sweats, change in appetite. Eyes: Denies redness, blurred vision, diplopia, discharge, itchy, watery eyes.  ENT: Denies discharge, congestion, post nasal drip, epistaxis, sore throat, earache, hearing loss, dental pain, tinnitus, vertigo, sinus pain, snoring.  CV: Denies chest pain, palpitations, irregular heartbeat, syncope, dyspnea, diaphoresis, orthopnea, PND, claudication or edema. Respiratory: denies cough, dyspnea, DOE, pleurisy, hoarseness, laryngitis, wheezing.  Gastrointestinal: Denies dysphagia, odynophagia, heartburn, reflux, water brash, abdominal pain or cramps, nausea, vomiting,  bloating, diarrhea, constipation, hematemesis, melena, hematochezia  or hemorrhoids. Genitourinary: Denies dysuria, frequency, urgency, nocturia, hesitancy, discharge, hematuria or flank pain. Musculoskeletal: Denies arthralgias, myalgias, stiffness, jt. swelling, pain, limping or strain/sprain.  Skin: Denies pruritus, rash, hives, warts, acne, eczema or change in skin lesion(s). Neuro: No weakness, tremor, incoordination, spasms, paresthesia or pain. Psychiatric: Denies confusion, memory loss or sensory loss. Endo: Denies change in weight, skin or hair change.  Heme/Lymph: No excessive bleeding, bruising or enlarged lymph nodes.  Physical Exam  BP 122/66   Pulse 83   Temp 97.7 F (36.5 C)   Resp 18   Ht 5\' 8"  (1.727 m)   Wt 255 lb (115.7  kg) Comment: REPORTED BY CARE GIVER  SpO2 98%   BMI 38.77 kg/m   Appears over nourished  and in no distress.  Eyes: PERRLA, EOMs, conjunctiva no swelling or erythema. Sinuses: No frontal/maxillary tenderness ENT/Mouth: EAC's clear, TM's nl w/o erythema, bulging. Very poor hearing even w/hearing aids. Nares clear w/o erythema, swelling, exudates. Oropharynx clear without erythema or exudates. Oral hygiene is good. Tongue normal, non obstructing. Hearing intact.  Neck: Supple. Thyroid nl. Car 2+/2+ without bruits, nodes or JVD. Chest: Respirations nl with BS clear & equal w/o rales, rhonchi, wheezing or stridor.  Cor: Heart sounds normal w/ regular rate and rhythm without sig. murmurs, gallops, clicks or rubs. Peripheral pulses 1+/1+ and equal with 1-2+ pretibial/ankle edema.  Abdomen: Soft, rotund  & bowel sounds normal. Non-tender w/o guarding, rebound, hernias, masses or organomegaly.  Lymphatics: Unremarkable.  Musculoskeletal: Generalized decrease in muscle power, tone & bulk. Poor balance - limited to wheel chair. .  Skin: Warm, dry without exposed rashes, lesions or ecchymosis apparent.  Neuro: Cranial nerves intact, reflexes equal bilaterally. Sensory-motor testing grossly intact. Tendon reflexes grossly intact.  Pysch: Alert & oriented x 3.  Insight and judgement limited.  Assessment and Plan:   1. Acute respiratory failure with hypoxia ([place])  2. Hypervolemia, unspecified hypervolemia type  3. COPD (chronic obstructive pulmonary disease) with acute bronchitis ([place])  4. Acute kidney injury superimposed on CKD ([place])  - CBC with Differential/Platelet - COMPLETE METABOLIC PANEL WITH GFR  5. CKD (chronic kidney disease) stage 4, GFR 15-29 ml/min ([facility])  - CBC with Differential/Platelet - COMPLETE METABOLIC PANEL WITH GFR  6. Anemia in stage 4 chronic kidney disease ([facility])  - CBC with Differential/Platelet  7. Coronary artery disease involving native coronary artery of  native heart without angina pectoris  8. History of renal cell cancer  9. Gastroesophageal reflux diseas  10. Idiopathic gout  11. Hypothyroidism  - levothyroxine  200 MCG tablet; Take 1 tablet every morning on an empty stomach with only water for 30 minutes  Dispense: 90 tablet; Refill: 3  12. Hyperlipidemia, mixed  13. Prediabetes  - Continue diet, exercise, lifestyle modifications.  - Monitor appropriate labs. - Continue supplementation.  14. Hypertension   - Continue medication, monitor blood pressure at home.  - Continue DASH diet. Reminder to go to the ER if any CP,  SOB, nausea, dizziness, severe HA, changes vision/speech.  15. Medication management  - CBC with Differential/Platelet - COMPLETE METABOLIC PANEL WITH GFR     Visit in part was to evaluate need of Home Health needs for medical assistance and supervision due to decline in independence and general physical deterioration.           Discussed  regular exercise, BP monitoring, weight control to achieve/maintain BMI less than 25 and discussed meds and -  Continue diet/meds, exercise,& lifestyle modifications.  - Continue monitor periodic cholesterol/liver & renal functions   SE's. Recommended labs to assess and monitor clinical status with further disposition pending results of labs. Over 30 minutes of exam, counseling, chart review was performed.

## 2018-05-31 NOTE — Patient Instructions (Addendum)
Chronic Kidney Disease, Adult Chronic kidney disease (CKD) happens when the kidneys are damaged during a time of 3 or more months. The kidneys are two organs that do many important jobs in the body. These jobs include:  Removing wastes and extra fluids from the blood.  Making hormones that maintain the amount of fluid in your tissues and blood vessels.  Making sure that the body has the right amount of fluids and chemicals.  Most of the time, this condition does not go away, but it can usually be controlled. Steps must be taken to slow down the kidney damage or stop it from getting worse. Otherwise, the kidneys may stop working. Follow these instructions at home:  Follow your diet as told by your doctor. You may need to avoid alcohol, salty foods (sodium), and foods that are high in potassium, calcium, and protein.  Take over-the-counter and prescription medicines only as told by your doctor. Do not take any new medicines unless your doctor says you can do that. These include vitamins and minerals. ? Medicines and nutritional supplements can make kidney damage worse. ? Your doctor may need to change how much medicine you take.  Do not use any tobacco products. These include cigarettes, chewing tobacco, and e-cigarettes. If you need help quitting, ask your doctor.  Keep all follow-up visits as told by your doctor. This is important.  Check your blood pressure. Tell your doctor if there are changes to your blood pressure.  Get to a healthy weight. Stay at that weight. If you need help with this, ask your doctor.  Start or continue an exercise plan. Try to exercise at least 30 minutes a day, 5 days a week.  Stay up-to-date with your shots (immunizations) as told by your doctor. Contact a doctor if:  Your symptoms get worse.  You have new symptoms. Get help right away if:  You have symptoms of end-stage kidney disease. These include: ? Headaches. ? Skin that is darker or lighter  than normal. ? Numbness in your hands or feet. ? Easy bruising. ? Having hiccups often. ? Chest pain. ? Shortness of breath. ? Stopping of menstrual periods in women.  You have a fever.  You are making very little pee (urine).  You have pain or bleeding when you pee (urinate). +++++++++++++++++++++++++++++++++++++  Food Basics for Chronic Kidney Disease When your kidneys are not working well, they cannot remove waste and excess substances from your blood as effectively as they did before. This can lead to a buildup and imbalance of these substances, which can worsen kidney damage and affect how your body functions. Certain foods lead to a buildup of these substances in the body. By changing your diet as recommended by your diet and nutrition specialist (dietitian) or health care provider, you could help prevent further kidney damage and delay or prevent the need for dialysis. What are tips for following this plan? General instructions  Work with your health care provider and dietitian to develop a meal plan that is right for you. Foods you can eat, limit, or avoid will be different for each person depending on the stage of kidney disease and any other existing health conditions.  Talk with your health care provider about whether you should take a vitamin and mineral supplement.  Use standard measuring cups and spoons to measure servings of foods. Use a kitchen scale to measure portions of protein foods.  If directed by your health care provider, avoid drinking too much fluid. Measure and  count all liquids, including water, ice, soups, flavored gelatin, and frozen desserts such as popsicles or ice cream. Reading food labels  Check the amount of sodium in foods. Choose foods that have less than 300 milligrams (mg) per serving.  Check the ingredient list for phosphorus or potassium-based additives or preservatives.  Check the amount of saturated and trans fat. Limit or avoid these fats  as told by your dietitian. Shopping  Avoid buying foods that are: ? Processed, frozen, or prepackaged. ? Calcium-enriched or fortified.  Do not buy foods that have salt or sodium listed among the first five ingredients.  Do not buy canned vegetables. Cooking  Replace animal proteins, such as meat, fish, eggs, or dairy, with plant proteins from beans, nuts, and soy. ? Use soy milk instead of cow's milk. ? Add beans or tofu to soups, casseroles, or pasta dishes instead of meat.  Soak vegetables, such as potatoes, before cooking to reduce potassium. To do this: ? Peel and cut into small pieces. ? Soak in warm water for at least 2 hours. For every 1 cup of vegetables, use 10 cups of water. ? Drain and rinse with warm water. ? Boil for at least 5 minutes. Meal planning  Limit the amount of protein from plant and animal sources you eat each day.  Do not add salt to food when cooking or before eating.  Eat meals and snacks at around the same time each day. If you have diabetes:  If you have diabetes (diabetes mellitus) and chronic kidney disease, it is important to keep your blood glucose in the target range recommended by your health care provider. Follow your diabetes management plan. This may include: ? Checking your blood glucose regularly. ? Taking oral medicines, insulin, or both. ? Exercising for at least 30 minutes on 5 or more days each week, or as told by your health care provider. ? Tracking how many servings of carbohydrates you eat at each meal.  You may be given specific guidelines on how much of certain foods and nutrients you may eat, depending on your stage of kidney disease and whether you have high blood pressure (hypertension). Follow your meal plan as told by your dietitian. What nutrients should be limited? The items listed are not a complete list. Talk with your dietitian about what dietary choices are best for you. Potassium Potassium affects how steadily your  heart beats. If too much potassium builds up in your blood, it can cause an irregular heartbeat or even a heart attack. You may need to eat less potassium, depending on your blood potassium levels and the stage of kidney disease. Talk to your dietitian about how much potassium you may have each day. You may need to limit or avoid foods that are high in potassium, such as:  Milk and soy milk.  Fruits, such as bananas, papaya, apricots, nectarines, melon, prunes, raisins, kiwi, and oranges.  Vegetables, such as potatoes, sweet potatoes, yams, tomatoes, leafy greens, beets, okra, avocado, pumpkin, and winter squash.  White and lima beans.  Phosphorus Phosphorus is a mineral found in your bones. A balance between calcium and phosphorous is needed to build and maintain healthy bones. Too much phosphorus pulls calcium from your bones. This can make your bones weak and more likely to break. Too much phosphorus can also make your skin itch. You may need to eat less phosphorus depending on your blood phosphorus levels and the stage of kidney disease. Talk to your dietitian about how much  potassium you may have each day. You may need to take medicine to lower your blood phosphorus levels if diet changes do not help. You may need to limit or avoid foods that are high in phosphorus, such as:  Milk and dairy products.  Dried beans and peas.  Tofu, soy milk, and other soy-based meat replacements.  Colas.  Nuts and peanut butter.  Meat, poultry, and fish.  Bran cereals and oatmeals.  Protein Protein helps you to make and keep muscle. It also helps in the repair of your body's cells and tissues. One of the natural breakdown products of protein is a waste product called urea. When your kidneys are not working properly, they cannot remove wastes, such as urea, like they did before you developed chronic kidney disease. Reducing how much protein you eat can help prevent a buildup of urea in your  blood. Depending on your stage of kidney disease, you may need to limit foods that are high in protein. Sources of animal protein include:  Meat (all types).  Fish and seafood.  Poultry.  Eggs.  Dairy.  Other protein foods include:  Beans and legumes.  Nuts and nut butter.  Soy and tofu.  Sodium Sodium, which is found in salt, helps maintain a healthy balance of fluids in your body. Too much sodium can increase your blood pressure and have a negative effect on the function of your heart and lungs. Too much sodium can also cause your body to retain too much fluid, making your kidneys work harder. Most people should have less than 2,300 milligrams (mg) of sodium each day. If you have hypertension, you may need to limit your sodium to 1,500 mg each day. Talk to your dietitian about how much sodium you may have each day. You may need to limit or avoid foods that are high in sodium, such as:  Salt seasonings.  Soy sauce.  Cured and processed meats.  Salted crackers and snack foods.  Fast food.  Canned soups and most canned foods.  Pickled foods.  Vegetable juice.  Boxed mixes or ready-to-eat boxed meals and side dishes.  Bottled dressings, sauces, and marinades.  Summary  Chronic kidney disease can lead to a buildup and imbalance of waste and excess substances in the body. Certain foods lead to a buildup of these substances. By adjusting your intake of these foods, you could help prevent more kidney damage and delay or prevent the need for dialysis.  Food adjustments are different for each person with chronic kidney disease. Work with a dietitian to set up nutrient goals and a meal plan that is right for you.  If you have diabetes and chronic kidney disease, it is important to keep your blood glucose in the target range recommended by your health care provider. This information is not intended to replace advice given to you by your health care provider. Make sure you  discuss any questions you have with your health care provider. Document Released: 01/17/2003 Document Revised: 10/22/2016 Document Reviewed: 10/22/2016 Elsevier Interactive Patient Education  2018 [place].  +++++++++++++++++++++++++++++++++++++ Recommend Adult Low Dose Aspirin or  coated  Aspirin 81 mg daily  To reduce risk of Colon Cancer 20 %,  Skin Cancer 26 % ,  Melanoma 46%  and  Pancreatic cancer 60% +++++++++++++++++++++++++ Vitamin D goal  is between 70-100.  Please make sure that you are taking your Vitamin D as directed.  It is very important as a natural anti-inflammatory  helping hair, skin, and nails,  as well as reducing stroke and heart attack risk.  It helps your bones and helps with mood. It also decreases numerous cancer risks so please take it as directed.  Low Vit D is associated with a 200-300% higher risk for CANCER  and 200-300% higher risk for HEART   ATTACK  &  STROKE.   .....................................Marland Kitchen It is also associated with higher death rate at younger ages,  autoimmune diseases like Rheumatoid arthritis, Lupus, Multiple Sclerosis.    Also many other serious conditions, like depression, Alzheimer's Dementia, infertility, muscle aches, fatigue, fibromyalgia - just to name a few. ++++++++++++++++++++ Recommend the book "The END of DIETING" by Dr Excell Seltzer  & the book "The END of DIABETES " by Dr Excell Seltzer At [email or web address] - get book & Audio CD's    Being diabetic has a  300% increased risk for heart attack, stroke, cancer, and alzheimer- type vascular dementia. It is very important that you work harder with diet by avoiding all foods that are white. Avoid white rice (brown & wild rice is OK), white potatoes (sweetpotatoes in moderation is OK), White bread or wheat bread or anything made out of white flour like bagels, donuts, rolls, buns, biscuits, cakes, pastries, cookies, pizza crust, and pasta (made from white flour & egg whites) -  vegetarian pasta or spinach or wheat pasta is OK. Multigrain breads like Arnold's or Pepperidge Farm, or multigrain sandwich thins or flatbreads.  Diet, exercise and weight loss can reverse and cure diabetes in the early stages.  Diet, exercise and weight loss is very important in the control and prevention of complications of diabetes which affects every system in your body, ie. Brain - dementia/stroke, eyes - glaucoma/blindness, heart - heart attack/heart failure, kidneys - dialysis, stomach - gastric paralysis, intestines - malabsorption, nerves - severe painful neuritis, circulation - gangrene & loss of a leg(s), and finally cancer and Alzheimers.    I recommend avoid fried & greasy foods,  sweets/candy, white rice (brown or wild rice or Quinoa is OK), white potatoes (sweet potatoes are OK) - anything made from white flour - bagels, doughnuts, rolls, buns, biscuits,white and wheat breads, pizza crust and traditional pasta made of white flour & egg white(vegetarian pasta or spinach or wheat pasta is OK).  Multi-grain bread is OK - like multi-grain flat bread or sandwich thins. Avoid alcohol in excess. Exercise is also important.    Eat all the vegetables you want - avoid meat, especially red meat and dairy - especially cheese.  Cheese is the most concentrated form of trans-fats which is the worst thing to clog up our arteries. Veggie cheese is OK which can be found in the fresh produce section at [employer] or [employer] or [employer]  +++++++++++++++++++++ DASH Eating Plan  DASH stands for "Dietary Approaches to Stop Hypertension."   The DASH eating plan is a healthy eating plan that has been shown to reduce high blood pressure (hypertension). Additional health benefits may include reducing the risk of type 2 diabetes mellitus, heart disease, and stroke. The DASH eating plan may also help with weight loss. WHAT DO I NEED TO KNOW ABOUT THE DASH EATING PLAN? For the DASH eating plan, you will  follow these general guidelines:  Choose foods with a percent daily value for sodium of less than 5% (as listed on the food label).  Use salt-free seasonings or herbs instead of table salt or sea salt.  Check with your health care provider or pharmacist before using salt substitutes.  Eat lower-sodium products, often labeled as "lower sodium" or "no salt added."  Eat fresh foods.  Eat more vegetables, fruits, and low-fat dairy products.  Choose whole grains. Look for the word "whole" as the first word in the ingredient list.  Choose fish   Limit sweets, desserts, sugars, and sugary drinks.  Choose heart-healthy fats.  Eat veggie cheese   Eat more home-cooked food and less restaurant, buffet, and fast food.  Limit fried foods.  Cook foods using methods other than frying.  Limit canned vegetables. If you do use them, rinse them well to decrease the sodium.  When eating at a restaurant, ask that your food be prepared with less salt, or no salt if possible.                      WHAT FOODS CAN I EAT? Read Dr Fara Olden Fuhrman's books on The End of Dieting & The End of Diabetes  Grains Whole grain or whole wheat bread. Brown rice. Whole grain or whole wheat pasta. Quinoa, bulgur, and whole grain cereals. Low-sodium cereals. Corn or whole wheat flour tortillas. Whole grain cornbread. Whole grain crackers. Low-sodium crackers.  Vegetables Fresh or frozen vegetables (raw, steamed, roasted, or grilled). Low-sodium or reduced-sodium tomato and vegetable juices. Low-sodium or reduced-sodium tomato sauce and paste. Low-sodium or reduced-sodium canned vegetables.   Fruits All fresh, canned (in natural juice), or frozen fruits.  Protein Products  All fish and seafood.  Dried beans, peas, or lentils. Unsalted nuts and seeds. Unsalted canned beans.  Dairy Low-fat dairy products, such as skim or 1% milk, 2% or reduced-fat cheeses, low-fat ricotta or cottage cheese, or plain low-fat yogurt.  Low-sodium or reduced-sodium cheeses.  Fats and Oils Tub margarines without trans fats. Light or reduced-fat mayonnaise and salad dressings (reduced sodium). Avocado. Safflower, olive, or canola oils. Natural peanut or almond butter.  Other Unsalted popcorn and pretzels. The items listed above may not be a complete list of recommended foods or beverages. Contact your dietitian for more options.  +++++++++++++++  WHAT FOODS ARE NOT RECOMMENDED? Grains/ White flour or wheat flour White bread. White pasta. White rice. Refined cornbread. Bagels and croissants. Crackers that contain trans fat.  Vegetables  Creamed or fried vegetables. Vegetables in a . Regular canned vegetables. Regular canned tomato sauce and paste. Regular tomato and vegetable juices.  Fruits Dried fruits. Canned fruit in light or heavy syrup. Fruit juice.  Meat and Other Protein Products Meat in general - RED meat & White meat.  Fatty cuts of meat. Ribs, chicken wings, all processed meats as bacon, sausage, bologna, salami, fatback, hot dogs, bratwurst and packaged luncheon meats.  Dairy Whole or 2% milk, cream, half-and-half, and cream cheese. Whole-fat or sweetened yogurt. Full-fat cheeses or blue cheese. Non-dairy creamers and whipped toppings. Processed cheese, cheese spreads, or cheese curds.  Condiments Onion and garlic salt, seasoned salt, table salt, and sea salt. Canned and packaged gravies. Worcestershire sauce. Tartar sauce. Barbecue sauce. Teriyaki sauce. Soy sauce, including reduced sodium. Steak sauce. Fish sauce. Oyster sauce. Cocktail sauce. Horseradish. Ketchup and mustard. Meat flavorings and tenderizers. Bouillon cubes. Hot sauce. Tabasco sauce. Marinades. Taco seasonings. Relishes.  Fats and Oils Butter, stick margarine, lard, shortening and bacon fat. Coconut, palm kernel, or palm oils. Regular salad dressings.  Pickles and olives. Salted popcorn and pretzels.  The items listed above may not be  a complete list of foods and beverages to avoid.

## 2018-05-31 NOTE — Telephone Encounter (Signed)
PT'S Daughter called & sch his hosp fu  On 7/16  Called patient on 05/25/18 in an attempt to reach the patient for a hospital follow up.   Admit date: 05/14/18 Discharge: 05/24/18   He does not have any questions or concerns about medications from the hospital admission. The patient's medications were reviewed over the phone, they were counseled to bring in all current medications to the hospital follow up visit.   I advised the patient to call if any questions or concerns arise about the hospital admission or medications    Home health was  started in the hospital.  All questions were answered and a follow up appointment was made.   Prior to Admission medications   Medication Sig Start Date End Date Taking? Authorizing Provider  albuterol (PROVENTIL HFA;VENTOLIN HFA) 108 (90 Base) MCG/ACT inhaler Inhale 2 puffs into the lungs every 6 (six) hours as needed for wheezing or shortness of breath. 05/24/18   Dessa Phi, DO  allopurinol (ZYLOPRIM) 300 MG tablet TAKE 1 TABLET DAILY Patient taking differently: Take 150mg  daily 09/26/17   Vicie Mutters, PA-C  amLODipine (NORVASC) 10 MG tablet Take 10 mg by mouth daily. 04/16/18   [provider]  aspirin EC 81 MG tablet Take 81 mg by mouth daily.    [provider]  Cholecalciferol (VITAMIN D) 2000 units tablet Take 2,000 Units by mouth daily.     [provider]  FERROUS SULFATE PO Take 25 mg by mouth daily.     [provider]  furosemide (LASIX) 80 MG tablet Take 1 tablet (80 mg total) by mouth 2 (two) times daily. 05/24/18   Dessa Phi, DO  levothyroxine (SYNTHROID, LEVOTHROID) 200 MCG tablet Take 200 mcg by mouth daily before breakfast.    [provider]  mometasone-formoterol (DULERA) 200-5 MCG/ACT AERO Inhale 2 puffs into the lungs 2 (two) times daily. 05/24/18   Dessa Phi, DO  Multiple Vitamin (MULITIVITAMIN WITH MINERALS) TABS Take 1 tablet by mouth daily.    [provider]   nitroGLYCERIN (NITROSTAT) 0.4 MG SL tablet Place 0.4 mg under the tongue every 5 (five) minutes as needed for chest pain.     [provider]  pravastatin (PRAVACHOL) 40 MG tablet TAKE 1 TABLET AT BEDTIME 06/07/17   Vicie Mutters, PA-C  tiotropium (SPIRIVA HANDIHALER) 18 MCG inhalation capsule Place 1 capsule (18 mcg total) into inhaler and inhale daily. 05/24/18   Dessa Phi, DO

## 2018-06-04 ENCOUNTER — Other Ambulatory Visit: Payer: Self-pay | Admitting: Internal Medicine

## 2018-06-04 ENCOUNTER — Encounter: Payer: Self-pay | Admitting: Internal Medicine

## 2018-06-04 MED ORDER — LEVOFLOXACIN 250 MG PO TABS: ORAL_TABLET | ORAL | 1 refills | Status: AC

## 2018-06-10 ENCOUNTER — Telehealth: Payer: Self-pay | Admitting: *Deleted

## 2018-06-10 NOTE — Telephone Encounter (Signed)
A message was left to inform [name] that PT 1 time this week and 2 times a week x 3 weeks, is O per Dr Melford Aase.

## 2018-06-17 ENCOUNTER — Other Ambulatory Visit: Payer: Self-pay | Admitting: *Deleted

## 2018-06-17 ENCOUNTER — Encounter: Payer: Self-pay | Admitting: Internal Medicine

## 2018-06-17 ENCOUNTER — Ambulatory Visit: Payer: Medicare Other | Admitting: Internal Medicine

## 2018-06-17 VITALS — BP 140/54 | HR 72 | Temp 97.4°F | Resp 18 | Ht 69.0 in

## 2018-06-17 DIAGNOSIS — Z79899 Other long term (current) drug therapy: Secondary | ICD-10-CM | POA: Diagnosis not present

## 2018-06-17 DIAGNOSIS — N184 Chronic kidney disease, stage 4 (severe): Secondary | ICD-10-CM

## 2018-06-17 DIAGNOSIS — D631 Anemia in chronic kidney disease: Secondary | ICD-10-CM

## 2018-06-17 DIAGNOSIS — I10 Essential (primary) hypertension: Secondary | ICD-10-CM | POA: Diagnosis not present

## 2018-06-17 LAB — CBC WITH DIFFERENTIAL/PLATELET
BASOS ABS: 42 {cells}/uL (ref 0–200)
Basophils Relative: 0.4 %
EOS ABS: 294 {cells}/uL (ref 15–500)
Eosinophils Relative: 2.8 %
HEMATOCRIT: 23 % — AB (ref 38.5–50.0)
HEMOGLOBIN: 7.1 g/dL — AB (ref 13.2–17.1)
LYMPHS ABS: 1481 {cells}/uL (ref 850–3900)
MCH: 30.6 pg (ref 27.0–33.0)
MCHC: 30.9 g/dL — AB (ref 32.0–36.0)
MCV: 99.1 fL (ref 80.0–100.0)
MPV: 10.2 fL (ref 7.5–12.5)
Monocytes Relative: 10.8 %
NEUTROS ABS: 7550 {cells}/uL (ref 1500–7800)
NEUTROS PCT: 71.9 %
Platelets: 264 10*3/uL (ref 140–400)
RBC: 2.32 10*6/uL — ABNORMAL LOW (ref 4.20–5.80)
RDW: 15.6 % — ABNORMAL HIGH (ref 11.0–15.0)
Total Lymphocyte: 14.1 %
WBC mixed population: 1134 cells/uL — ABNORMAL HIGH (ref 200–950)
WBC: 10.5 10*3/uL (ref 3.8–10.8)

## 2018-06-17 LAB — COMPLETE METABOLIC PANEL WITH GFR
AG Ratio: 1.2 (calc) (ref 1.0–2.5)
ALBUMIN MSPROF: 3.4 g/dL — AB (ref 3.6–5.1)
ALKALINE PHOSPHATASE (APISO): 97 U/L (ref 40–115)
ALT: 13 U/L (ref 9–46)
AST: 18 U/L (ref 10–35)
BILIRUBIN TOTAL: 0.5 mg/dL (ref 0.2–1.2)
BUN / CREAT RATIO: 19 (calc) (ref 6–22)
BUN: 61 mg/dL — ABNORMAL HIGH (ref 7–25)
CHLORIDE: 98 mmol/L (ref 98–110)
CO2: 26 mmol/L (ref 20–32)
CREATININE: 3.13 mg/dL — AB (ref 0.70–1.11)
Calcium: 8.5 mg/dL — ABNORMAL LOW (ref 8.6–10.3)
GFR, Est African American: 20 mL/min/{1.73_m2} — ABNORMAL LOW (ref >=60)
GFR, Est Non African American: 17 mL/min/{1.73_m2} — ABNORMAL LOW (ref >=60)
GLOBULIN: 2.8 g/dL (ref 1.9–3.7)
Glucose, Bld: 135 mg/dL — ABNORMAL HIGH (ref 65–99)
POTASSIUM: 4 mmol/L (ref 3.5–5.3)
SODIUM: 137 mmol/L (ref 135–146)
Total Protein: 6.2 g/dL (ref 6.1–8.1)

## 2018-06-17 NOTE — Progress Notes (Signed)
Subjective:    Patient ID: [name], male    DOB: 12/09/1928, 83 y.o.   MRN: [id number]  HPI    Patient is a nice 84 yo WWM returning for short term follow from 7/22 post hospital visit (7/5-7/15/2019) after hospitalization  For AECB superimposed on his COPD and associated mild CHF. Patient also has Stage 4 CKD and associated Anemia of Renal Disease.      Post recent hospital stay his Hgb had risen from 8.0 gm% up to 8.8gm%.  Post hospital, his renal functions had improved slightly with BUN 91 -->> 56, Creat 2.58 -->> 2.45 and GFR remained about the same at 21 -->> 23. His caretaker daughter Juliann Pulse) reports her frustrations with his poor cooperation and refusing to drink water/fluids. Appetite also reported poor. Receiving [facility] with PT & Nurse Visits.  Medication Sig  . albuterol (PROVENTIL HFA;VENTOLIN HFA) 108 (90 Base) MCG/ACT inhaler Inhale 2 puffs into the lungs every 6 (six) hours as needed for wheezing or shortness of breath.  . allopurinol (ZYLOPRIM) 300 MG tablet TAKE 1 TABLET DAILY (Patient taking differently: Take 150mg  daily)  . amLODipine (NORVASC) 10 MG tablet Take 10 mg by mouth daily.  Marland Kitchen aspirin EC 81 MG tablet Take 81 mg by mouth daily.  . Cholecalciferol (VITAMIN D) 2000 units tablet Take 2,000 Units by mouth daily.   Marland Kitchen FERROUS SULFATE PO Take 25 mg by mouth daily.   . furosemide (LASIX) 80 MG tablet Take 1 tablet (80 mg total) by mouth 2 (two) times daily.  Marland Kitchen levofloxacin (LEVAQUIN) 250 MG tablet Take 1 tablet daily for Infection  . levothyroxine (SYNTHROID, LEVOTHROID) 200 MCG tablet Take 1 tablet every morning on an empty stomach with only water for 30 minutes  . mometasone-formoterol (DULERA) 200-5 MCG/ACT AERO Inhale 2 puffs into the lungs 2 (two) times daily.  . Multiple Vitamin (MULITIVITAMIN WITH MINERALS) TABS Take 1 tablet by mouth daily.  . nitroGLYCERIN (NITROSTAT) 0.4 MG SL tablet Place 0.4 mg under the tongue every 5 (five) minutes as needed for chest pain.    . pravastatin (PRAVACHOL) 40 MG tablet TAKE 1 TABLET AT BEDTIME  . tiotropium (SPIRIVA HANDIHALER) 18 MCG inhalation capsule Place 1 capsule (18 mcg total) into inhaler and inhale daily.   No facility-administered medications prior to visit.    No Known Allergies Past Medical History:  Diagnosis Date  . Anemia   . Arthritis    stenosis - back, neck  . Cancer [facility])    renal neoplasm  . Chronic kidney disease    renalCa- nephrectomy [facility]) 04/2012  . Coronary artery disease   . GERD (gastroesophageal reflux disease)   . Hard of hearing   . Hearing deficit    hearing aids- bilateral  . Hypertension    sees Dr. Wynonia Lawman, stress test recently for prep for surgery  . Hypothyroidism   . 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 Prediabetes   . Vitamin D deficiency    Past Surgical History:  Procedure Laterality Date  . ANTERIOR CERVICAL DECOMP/DISCECTOMY FUSION N/A 01/18/2013   Procedure: ANTERIOR CERVICAL DECOMPRESSION/DISCECTOMY FUSION 2 LEVELS;  Surgeon: Faythe Ghee, MD;  Location: [place] [facility];  Service: Neurosurgery;  Laterality: N/A;  . APPENDECTOMY    . BACK SURGERY     lower back surgery - 1990's  . CHOLECYSTECTOMY    . CORONARY ANGIOPLASTY     stents , + 7-76yrs.  ago  . EYE SURGERY     right eye cataract surgery   . I&D EXTREMITY Right 09/08/2016   Procedure: IRRIGATION AND DEBRIDEMENT RIGHT FOOT WITH ANTIBIOTIC BEADS;  Surgeon: Edrick Kins, DPM;  Location: [place];  Service: Podiatry;  Laterality: Right;  . JOINT REPLACEMENT     bilateral knee replacements   . LAPAROSCOPIC NEPHRECTOMY  04/29/2012   Procedure: LAPAROSCOPIC NEPHRECTOMY;  Surgeon: Dutch Gray, MD;  Location: [facility];  Service: Urology;  Laterality: Left;      . TOE ARTHROPLASTY Right 09/08/2016   Procedure: Arthroplasty of First MPJ RIGHT FOOT;  Surgeon: Edrick Kins, DPM;  Location: [place];  Service: Podiatry;  Laterality: Right;  .  TONSILLECTOMY    . WOUND DEBRIDEMENT Right 09/08/2016   Procedure: DEBRIDEMENT RIGHT ANKLE WOUND;  Surgeon: Edrick Kins, DPM;  Location: [place];  Service: Podiatry;  Laterality: Right;   Review of Systems    10 point systems review negative except as above.     Objective:   Physical Exam  BP (!) 140/54   Pulse 72   Temp (!) 97.4 F (36.3 C)   Resp 18   Morbidly Obese and in no distress sitting in his wheelchair.   HEENT - WNL.poor hearing.  Neck - supple.  Chest -  BS decreased , but clear. Cor - Nl HS. RRR w/o sig m. PP 1+/+ with  1-2(+) edema. MS- FROM w/o deformities.  In wheel chair Neuro -  Nl w/o focal abnormalities.    Assessment & Plan:   1. Essential hypertension  - CBC with Differential/Platelet - COMPLETE METABOLIC PANEL WITH GFR  2. Anemia in stage 4 chronic kidney disease (HCC)  - CBC with Differential/Platelet - COMPLETE METABOLIC PANEL WITH GFR  3. Medication management  - CBC with Differential/Platelet - COMPLETE METABOLIC PANEL WITH GFR

## 2018-07-11 DEATH — deceased

## 2018-07-13 ENCOUNTER — Ambulatory Visit: Payer: Self-pay | Admitting: Internal Medicine

## 2018-07-19 ENCOUNTER — Ambulatory Visit: Payer: Medicare Other | Admitting: Podiatry

## 2018-08-03 ENCOUNTER — Other Ambulatory Visit: Payer: Medicare Other

## 2018-08-05 ENCOUNTER — Ambulatory Visit: Payer: Medicare Other | Admitting: Oncology

## 2018-08-17 ENCOUNTER — Ambulatory Visit: Payer: Self-pay | Admitting: Internal Medicine

## 2019-03-02 ENCOUNTER — Encounter: Payer: Self-pay | Admitting: Internal Medicine
